# Patient Record
Sex: Female | Born: 1950 | Race: Black or African American | Hispanic: No | Smoking: Current every day smoker
Health system: Southern US, Community
[De-identification: ages and names within clinical notes are randomized; demographics above are authoritative.]

## PROBLEM LIST (undated history)

## (undated) DIAGNOSIS — IMO0001 Reserved for inherently not codable concepts without codable children: Secondary | ICD-10-CM

## (undated) DIAGNOSIS — I10 Essential (primary) hypertension: Secondary | ICD-10-CM

## (undated) DIAGNOSIS — Z720 Tobacco use: Secondary | ICD-10-CM

## (undated) DIAGNOSIS — K2970 Gastritis, unspecified, without bleeding: Secondary | ICD-10-CM

## (undated) DIAGNOSIS — K2289 Other specified disease of esophagus: Secondary | ICD-10-CM

## (undated) DIAGNOSIS — R208 Other disturbances of skin sensation: Secondary | ICD-10-CM

## (undated) DIAGNOSIS — I2510 Atherosclerotic heart disease of native coronary artery without angina pectoris: Secondary | ICD-10-CM

## (undated) DIAGNOSIS — M1990 Unspecified osteoarthritis, unspecified site: Secondary | ICD-10-CM

## (undated) DIAGNOSIS — F17200 Nicotine dependence, unspecified, uncomplicated: Secondary | ICD-10-CM

## (undated) DIAGNOSIS — R51 Headache: Secondary | ICD-10-CM

## (undated) DIAGNOSIS — K279 Peptic ulcer, site unspecified, unspecified as acute or chronic, without hemorrhage or perforation: Secondary | ICD-10-CM

## (undated) DIAGNOSIS — G43909 Migraine, unspecified, not intractable, without status migrainosus: Secondary | ICD-10-CM

## (undated) DIAGNOSIS — J45909 Unspecified asthma, uncomplicated: Secondary | ICD-10-CM

## (undated) DIAGNOSIS — K228 Other specified diseases of esophagus: Secondary | ICD-10-CM

## (undated) DIAGNOSIS — E785 Hyperlipidemia, unspecified: Secondary | ICD-10-CM

## (undated) DIAGNOSIS — I209 Angina pectoris, unspecified: Secondary | ICD-10-CM

## (undated) DIAGNOSIS — J40 Bronchitis, not specified as acute or chronic: Secondary | ICD-10-CM

## (undated) DIAGNOSIS — J189 Pneumonia, unspecified organism: Secondary | ICD-10-CM

## (undated) DIAGNOSIS — K219 Gastro-esophageal reflux disease without esophagitis: Secondary | ICD-10-CM

## (undated) HISTORY — PX: FRACTURE SURGERY: SHX138

## (undated) HISTORY — PX: BACK SURGERY: SHX140

## (undated) HISTORY — PX: COLONOSCOPY: SHX174

---

## 1999-11-12 ENCOUNTER — Encounter: Payer: Self-pay | Admitting: Gastroenterology

## 1999-11-12 ENCOUNTER — Ambulatory Visit (HOSPITAL_COMMUNITY): Admission: RE | Admit: 1999-11-12 | Discharge: 1999-11-12 | Payer: Self-pay | Admitting: Gastroenterology

## 2000-04-13 ENCOUNTER — Other Ambulatory Visit: Admission: RE | Admit: 2000-04-13 | Discharge: 2000-04-13 | Payer: Self-pay | Admitting: Obstetrics

## 2000-10-20 ENCOUNTER — Encounter: Payer: Self-pay | Admitting: Obstetrics

## 2000-10-20 ENCOUNTER — Encounter: Admission: RE | Admit: 2000-10-20 | Discharge: 2000-10-20 | Payer: Self-pay | Admitting: Obstetrics

## 2001-08-02 ENCOUNTER — Inpatient Hospital Stay (HOSPITAL_COMMUNITY): Admission: AD | Admit: 2001-08-02 | Discharge: 2001-08-02 | Payer: Self-pay | Admitting: Obstetrics

## 2001-08-02 ENCOUNTER — Encounter (INDEPENDENT_AMBULATORY_CARE_PROVIDER_SITE_OTHER): Payer: Self-pay

## 2001-10-23 ENCOUNTER — Encounter: Admission: RE | Admit: 2001-10-23 | Discharge: 2001-10-23 | Payer: Self-pay | Admitting: Obstetrics

## 2001-10-23 ENCOUNTER — Encounter: Payer: Self-pay | Admitting: Obstetrics

## 2002-02-19 ENCOUNTER — Ambulatory Visit (HOSPITAL_COMMUNITY): Admission: RE | Admit: 2002-02-19 | Discharge: 2002-02-19 | Payer: Self-pay | Admitting: Cardiology

## 2002-02-19 ENCOUNTER — Encounter: Payer: Self-pay | Admitting: Cardiology

## 2002-03-07 HISTORY — PX: CARDIAC CATHETERIZATION: SHX172

## 2002-03-29 ENCOUNTER — Ambulatory Visit (HOSPITAL_BASED_OUTPATIENT_CLINIC_OR_DEPARTMENT_OTHER): Admission: RE | Admit: 2002-03-29 | Discharge: 2002-03-29 | Payer: Self-pay | Admitting: Orthopedic Surgery

## 2002-03-29 ENCOUNTER — Encounter (INDEPENDENT_AMBULATORY_CARE_PROVIDER_SITE_OTHER): Payer: Self-pay | Admitting: *Deleted

## 2002-10-29 ENCOUNTER — Encounter: Payer: Self-pay | Admitting: Obstetrics

## 2002-10-29 ENCOUNTER — Encounter: Admission: RE | Admit: 2002-10-29 | Discharge: 2002-10-29 | Payer: Self-pay | Admitting: Obstetrics

## 2002-11-15 ENCOUNTER — Ambulatory Visit (HOSPITAL_BASED_OUTPATIENT_CLINIC_OR_DEPARTMENT_OTHER): Admission: RE | Admit: 2002-11-15 | Discharge: 2002-11-15 | Payer: Self-pay | Admitting: Orthopedic Surgery

## 2002-12-27 HISTORY — PX: CORONARY ANGIOPLASTY: SHX604

## 2003-09-13 ENCOUNTER — Observation Stay (HOSPITAL_COMMUNITY): Admission: EM | Admit: 2003-09-13 | Discharge: 2003-09-14 | Payer: Self-pay | Admitting: Emergency Medicine

## 2003-09-13 ENCOUNTER — Encounter: Payer: Self-pay | Admitting: Emergency Medicine

## 2003-09-13 HISTORY — PX: CARDIAC CATHETERIZATION: SHX172

## 2003-12-30 ENCOUNTER — Encounter: Admission: RE | Admit: 2003-12-30 | Discharge: 2003-12-30 | Payer: Self-pay | Admitting: Obstetrics

## 2004-12-27 HISTORY — PX: ORIF ELBOW FRACTURE: SUR928

## 2006-05-11 ENCOUNTER — Ambulatory Visit (HOSPITAL_COMMUNITY): Admission: RE | Admit: 2006-05-11 | Discharge: 2006-05-11 | Payer: Self-pay | Admitting: Obstetrics

## 2006-05-27 HISTORY — PX: OTHER SURGICAL HISTORY: SHX169

## 2006-06-29 ENCOUNTER — Inpatient Hospital Stay (HOSPITAL_COMMUNITY): Admission: EM | Admit: 2006-06-29 | Discharge: 2006-07-02 | Payer: Self-pay | Admitting: Emergency Medicine

## 2006-07-06 ENCOUNTER — Emergency Department (HOSPITAL_COMMUNITY): Admission: EM | Admit: 2006-07-06 | Discharge: 2006-07-07 | Payer: Self-pay | Admitting: Emergency Medicine

## 2007-01-27 ENCOUNTER — Encounter: Admission: RE | Admit: 2007-01-27 | Discharge: 2007-01-27 | Payer: Self-pay | Admitting: Orthopedic Surgery

## 2007-05-15 ENCOUNTER — Ambulatory Visit (HOSPITAL_COMMUNITY): Admission: RE | Admit: 2007-05-15 | Discharge: 2007-05-15 | Payer: Self-pay | Admitting: Obstetrics

## 2007-06-06 ENCOUNTER — Ambulatory Visit (HOSPITAL_COMMUNITY): Admission: RE | Admit: 2007-06-06 | Discharge: 2007-06-06 | Payer: Self-pay | Admitting: Obstetrics

## 2007-06-09 ENCOUNTER — Encounter (INDEPENDENT_AMBULATORY_CARE_PROVIDER_SITE_OTHER): Payer: Self-pay | Admitting: Gastroenterology

## 2007-06-09 ENCOUNTER — Ambulatory Visit (HOSPITAL_COMMUNITY): Admission: RE | Admit: 2007-06-09 | Discharge: 2007-06-09 | Payer: Self-pay | Admitting: Gastroenterology

## 2007-07-18 ENCOUNTER — Encounter: Admission: RE | Admit: 2007-07-18 | Discharge: 2007-07-18 | Payer: Self-pay | Admitting: Gastroenterology

## 2008-02-05 ENCOUNTER — Ambulatory Visit (HOSPITAL_COMMUNITY): Admission: RE | Admit: 2008-02-05 | Discharge: 2008-02-05 | Payer: Self-pay | Admitting: Gastroenterology

## 2008-02-06 ENCOUNTER — Observation Stay (HOSPITAL_COMMUNITY): Admission: EM | Admit: 2008-02-06 | Discharge: 2008-02-07 | Payer: Self-pay | Admitting: Emergency Medicine

## 2008-02-20 ENCOUNTER — Ambulatory Visit (HOSPITAL_COMMUNITY): Admission: RE | Admit: 2008-02-20 | Discharge: 2008-02-20 | Payer: Self-pay | Admitting: Gastroenterology

## 2008-07-27 HISTORY — PX: TRANSTHORACIC ECHOCARDIOGRAM: SHX275

## 2008-12-27 HISTORY — PX: CARDIOVASCULAR STRESS TEST: SHX262

## 2010-05-06 ENCOUNTER — Ambulatory Visit (HOSPITAL_COMMUNITY): Admission: RE | Admit: 2010-05-06 | Discharge: 2010-05-06 | Payer: Self-pay | Admitting: Obstetrics

## 2010-05-10 ENCOUNTER — Emergency Department (HOSPITAL_COMMUNITY): Admission: EM | Admit: 2010-05-10 | Discharge: 2010-05-10 | Payer: Self-pay | Admitting: Emergency Medicine

## 2011-05-11 NOTE — Op Note (Signed)
Meghan Lynn, Meghan Lynn               ACCOUNT NO.:  [id number]   MEDICAL RECORD NO.:  [id number]          PATIENT TYPE:  AMB   LOCATION:  ENDO                         FACILITY:  [facility]   PHYSICIAN:  John C. Madilyn Fireman, M.D.    DATE OF BIRTH:  1951-01-30   DATE OF PROCEDURE:  02/05/2008  DATE OF DISCHARGE:                               OPERATIVE REPORT   PROCEDURE:  Esophagogastroduodenoscopy with pyloric balloon dilatation.   INDICATIONS FOR PROCEDURE:  Pyloric outlet obstruction due to excessive  use of nonsteroidal anti-inflammatory drugs.   PROCEDURE:  The patient was placed in the left lateral decubitus  position and placed on pulse monitor with continuous low-flow oxygen  delivered by nasal cannula.  She was sedated with 75 mcg IV fentanyl and  7 mg IV Versed.  The Olympus video endoscope was advanced under direct  vision into the oropharynx and esophagus.  The esophagus was straight  and of normal caliber with squamocolumnar line at 38 cm. Stomach was  entered and there was a moderate-to-large amount of old partially  digested food in the dependent portion of the fundus.  A complete  inspection of the stomach was therefore not undertaken.  The scope was  advanced to the antrum which was well seen and there was a small  prepyloric shallow ulcer about 2 cm away from the pylorus.  The pylorus  itself appeared round, fixed and stenotic with exudate within the  pyloric channel as before.  That opening was fairly small estimated to  be about 6 mm in diameter and would not admit the tip of the scope as  before.  The pyloric balloon dilator size 6, 7, and 8 was advanced under  fluoro with Renografin in the balloon and with a guidewire advancing  ahead of the balloon. It was positioned then inflated easily all the way  to 8 mm with little resistance and the balloon pulled back through into  the stomach very easily even when fully inflated.  There was no blood  seen from this dilatation.  This  dilator was thus withdrawn and then 8-9-  10 mm balloon was placed and similarly inflated this time with some  resistance, small amount of bleeding.  When fully inflated the balloon  still would shift fairly easily within up and down through the pylorus.  It was then deflated and a third balloon dilator diameters 10-07-11 was  then inflated first to 10 then to 11 and then to 12 mm and held for 15-  second intervals each.  It was then deflated and the scope withdrawn and  there was a significant amount of blood around the pyloric opening.  I  did not attempt pass the scope through it.  Scope was then withdrawn and  the patient returned to the recovery room in stable condition.  She  tolerated the procedure well.  There were no immediate complications.   IMPRESSION:  1. Pyloric stenosis with partial outlet obstruction, status post      dilatation to 12 mm.   PLAN:  Will allow a 2-week interval for healing and assessment of  clearance on a gradually advance diet.  Will plan on tentatively  repeating the procedure in 2 weeks with probable further dilatation up  to 14 or 16 mm.  Will need to stop her aspirin and Plavix 5 days before.           ______________________________  Everardo All. Madilyn Fireman, M.D.     JCH/MEDQ  D:  02/05/2008  T:  02/06/2008  Job:  [id number]   cc:   Madaline Savage, M.D.  [facility]

## 2011-05-11 NOTE — Op Note (Signed)
NAMESONNA, LIPSKY               ACCOUNT NO.:  [id number]   MEDICAL RECORD NO.:  [id number]          PATIENT TYPE:  AMB   LOCATION:  ENDO                         FACILITY:  [facility]   PHYSICIAN:  Anselmo Rod, M.D.  DATE OF BIRTH:  11-May-1951   DATE OF PROCEDURE:  06/09/2007  DATE OF DISCHARGE:                               OPERATIVE REPORT   PROCEDURE PERFORMED:  Esophagogastroduodenoscopy with multiple biopsies.   ENDOSCOPIST:  Anselmo Rod, M.D.   INSTRUMENT USED:  Pentax video panendoscope.   INDICATIONS FOR PROCEDURE:  60 year old African American female  undergoing EGD for dysphagia and gastric pain, rule out peptic ulcer  disease, esophagitis, gastritis, etc.  A dilatation is planned if  needed.   PREPROCEDURE PREPARATION:  Informed consent was procured from the  patient.  The patient fasted for 8 hours prior to procedure.  The  risks  and benefits of the procedure were discussed with the patient in great  detail.   PREPROCEDURE PHYSICAL:  The patient had stable vital signs.  NECK:  Supple.  The CHEST:  Clear to auscultation.  S2 regular.  Abdomen soft  with normal bowel sounds.   DESCRIPTION OF PROCEDURE:  The patient was placed in the left lateral  decubitus position and sedated with 85 mcg of Fentanyl and 8.5 mg of  Versed given intravenously in slow incremental doses.  Once the patient  was adequately sedate and maintained on low-flow oxygen and continuous  cardiac monitoring, the Pentax video panendoscope was advanced through  the mouthpiece over the tongue into esophagus under direct vision.  The  entire esophagus was widely patent with no evidence of ring, stricture,  mass, esophagitis or Barrett's mucosa.  The scope was then advanced into  the stomach.  Moderate diffuse gastritis was noted.  Multiple antral  erosions and a small superficial ulcer, no visible vessel was noted.  Retroflexion in the high cardia revealed no evidence of a hiatal hernia.  Antral biopsies done to rule out presence of H pylori by pathology.  There seemed to be some degree of pyloric stenosis as I could not  intubate the small bowel in spite of multiple efforts to do so there was  friability with easy bleeding in the antrum.  The proximal small bowel  was not visualized.  The patient tolerated the procedure well without  immediate complications.   IMPRESSION:  1. Normal-appearing appearing esophagus, widely patent with a healthy      Z-line. No Barrett's mucosa, stricture, esophagitis or masses seen.  2. Moderate diffuse gastritis with antral erosions and superficial      antral ulcer.  Biopsies done to rule out H pylori.  3. Pyloric stenosis, proximal small bowel not visualized.   RECOMMENDATIONS:  1. Continue PPI's.  2. Await pathology results.  3. Treat with antibiotics if H pylori present on biopsies.  4. Avoid all nonsteroidals for now.  5. Outpatient follow-up in the next 2 weeks for further      recommendations.      Anselmo Rod, M.D.  Electronically Signed     JNM/MEDQ  D:  06/09/2007  T:  06/09/2007  Job:  [id number]   cc:   Kathreen Cosier, M.D.  Fax: [phone]   J. Kristen Cardinal, D.D.S.  Fax: [phone]

## 2011-05-11 NOTE — Op Note (Signed)
NAMECHRISTOL, Meghan Lynn               ACCOUNT NO.:  [id number]   MEDICAL RECORD NO.:  [id number]          PATIENT TYPE:  AMB   LOCATION:  ENDO                         [facility]ACILITY:  [facility]   PHYSICIAN:  John C. Madilyn Fireman, M.D.    DATE OF BIRTH:  08/24/1951   DATE OF PROCEDURE:  02/20/2008  DATE OF DISCHARGE:                               OPERATIVE REPORT   PROCEDURE PERFORMED:  Esophagogastroduodenoscopy.   INDICATIONS FOR PROCEDURE:  Pyloric stenosis with gastric outlet  obstruction and successful dilatation to 12 mm in the past. She has had  some improvement and some weight gain since then.   PROCEDURE:  The patient was placed in the left lateral decubitus  position and placed on the pulse monitor with continuous low flow oxygen  delivered by nasal cannula.  She was sedated with 75 mcg IV fentanyl and  7.5 mg IV Versed.  The Olympus video endoscope was advanced under direct  vision into the oropharynx and esophagus.  The esophagus was straight  and of normal caliber with the squamocolumnar line at 38 cm. There was  no hiatal hernia, ring, stricture, or other abnormality of the GE  junction.  The stomach was entered and a small amount of liquid  secretions were suctioned from the fundus.  Retroflexed view of the  cardia was unremarkable.  The fundus and body appeared normal.  The  antrum did show some mild erythema and granularity consistent with some  small erosions. The pylorus, itself, appeared stenotic with a small rim  of inflammatory exudate and would not admit the tip of the scope. The  pyloric balloon dilator of adjustable diameter of 10, 11, and 12 mm was  inflated in increments for 15 seconds each up to 12 mm and then deflated  leaving a moderate amount of blood.  This was removed and then the 12,  13.5, and 15 mm balloon was deployed in the same way.  The scope was  then withdrawn and the patient returned to the recovery room in stable  condition.  He tolerated the procedure well  and there were no immediate  complications.   IMPRESSION:  Pyloric stenosis status post dilatation to 15 mm.   PLAN:  Will follow up in the office and assess whether or not she needs  further dilatation.           ______________________________  Everardo All Madilyn Fireman, M.D.     JCH/MEDQ  D:  02/20/2008  T:  02/20/2008  Job:  [id number]   cc:   Madaline Savage, M.D.  Fax: [phone]

## 2011-05-11 NOTE — H&P (Signed)
NAMEMICHIE, MOLNAR               ACCOUNT NO.:  [id number]   MEDICAL RECORD NO.:  [id number]          PATIENT TYPE:  INP   LOCATION:  5127                         FACILITY:  [facility]   PHYSICIAN:  John C. Madilyn Fireman, M.D.    DATE OF BIRTH:  [birth year]   DATE OF ADMISSION:  02/06/2008  DATE OF DISCHARGE:                              HISTORY & PHYSICAL   CHIEF COMPLAINT:  Vomiting blood.   HISTORY OF PRESENT ILLNESS:  The patient is a 60 year old black female  who underwent a dilatation of pyloric channel stenosis related to NSAID  use yesterday.  Yesterday afternoon she began having about nausea and  vomiting of a little bit of blood.  Later on early this morning she  vomited up more blood and came to the emergency room.  She was found to  have normal hemoglobin and acute abdominal series revealed no free air.  She is not having significant chest or abdominal pain but is admitted  for observation and consideration of EGD if GI blood loss continues.   PAST MEDICAL HISTORY:  1. Hypertension.  2. Migraine headaches.  3. History of abnormal heart rhythm.  4. Coronary artery disease.  5. History of depression.   SURGERIES:  1. Cardiac stent placement.  2. Tubal ligation.  3. Elbow surgery.   FAMILY HISTORY:  Mother had colon polyps.   SOCIAL HISTORY:  The patient is retired.  She smokes half a pack of  cigarettes a day.  She denies alcohol use.   ALLERGIES:  DARVON.   MEDICATIONS:  Aspirin 81 mg, currently on hold, Diovan. Niaspan,  AcipHex, Sular, Lipitor, Plavix currently on hold, Fioricet for  headaches.   PHYSICAL EXAM:  Thin black female in no acute distress.  HEART:  Regular rate and rhythm without murmur.  LUNGS:  Clear.  ABDOMEN:  Soft, nondistended, with normoactive bowel sounds.  No  hepatosplenomegaly, mass or guarding.   IMPRESSION:  Gastrointestinal bleeding after duodenal dilatation  yesterday.   PLAN:  Admit for IV fluid support, nausea control and observation  for  ongoing GI bleeding.  May or may not need repeat EGD.           ______________________________  Everardo All. Madilyn Fireman, M.D.     JCH/MEDQ  D:  02/06/2008  T:  02/07/2008  Job:  [id number]

## 2011-05-11 NOTE — Op Note (Signed)
[name], Meghan Lynn               ACCOUNT NO.:  [id number]   MEDICAL RECORD NO.:  [id number]          PATIENT TYPE:  AMB   LOCATION:  ENDO                         FACILITY:  [facility]   PHYSICIAN:  Anselmo Rod, M.D.  DATE OF BIRTH:  August 29, 1951   DATE OF PROCEDURE:  06/09/2007  DATE OF DISCHARGE:                               OPERATIVE REPORT   PROCEDURE PERFORMED:  Colonoscopy with cold biopsies x3.   ENDOSCOPIST:  Anselmo Rod, M.D.   INSTRUMENT USED:  Pentax video colonoscope.   INDICATIONS FOR PROCEDURE:  A 60 year old African American female  undergoing a screening colonoscopy to rule out colonic polyps, masses,  etc.   PREPROCEDURE PREPARATION:  Informed consent was procured from the  patient.  The patient fasted for 8 hours prior to the procedure and  prepped with a bottle of magnesium citrate and a gallon of NuLYTELY the  night prior to the procedure.  The risks and benefits of the procedure  including a 10% miss rate of cancer and polyps were discussed with the  patient as well.   PREPROCEDURE PHYSICAL:  VITAL SIGNS:  The patient had stable vital  signs.  NECK:  Supple.  CHEST:  Clear to auscultation.  S1, S2 regular.  ABDOMEN:  Soft with normal bowel sounds.   DESCRIPTION OF PROCEDURE:  The patient was placed in the left lateral  decubitus position and sedated with an additional 15 mcg of Fentanyl and  1.5 mg of Versed given intravenously in slow incremental doses.  Once  the patient was adequately sedate and maintained on low-flow oxygen and  continuous cardiac monitoring, the Pentax video colonoscope was advanced  from the rectum to the cecum.  The appendiceal orifice and ileocecal  valve were visualized and photographed.  No masses, polyps, erosions,  etc., was seen.  There was no evidence of diverticulosis.  There was a  large amount of residual stool in the colon.  Multiple washes were done.  The terminal ileum appeared healthy and without lesions.  Small  lesions  could be missed because of a poor prep.  A small erosion was biopsied at  15 cm (cold biopsies x3).  Retroflexion in the rectum revealed no  abnormalities.  The patient tolerated the procedure well without  immediate complications.   IMPRESSION:  1. Essentially normal colonoscopy up to the terminal ileum except for      a small erosions biopsied from 1-5 cm.  2. No masses, polyps or diverticula seen.  3. Large amount of residual stool in the colon.  Multiple washes done.      Small lesions could be missed.   RECOMMENDATIONS:  1. Await pathology results.  2. Avoid all nonsteroidals including aspirin for the next 8 weeks.  3. Outpatient follow-up in the next 2 weeks for further      recommendations.      Anselmo Rod, M.D.  Electronically Signed     JNM/MEDQ  D:  06/09/2007  T:  06/09/2007  Job:  [id number]   cc:   Kathreen Cosier, M.D.  Fax: [phone]   Deanna Artis.  Elsie Lincoln, M.D.  Fax: [phone]

## 2011-05-11 NOTE — Op Note (Signed)
Meghan Lynn, Meghan Lynn               ACCOUNT NO.:  [id number]   MEDICAL RECORD NO.:  [id number]          PATIENT TYPE:  AMB   LOCATION:  ENDO                         FACILITY:  [facility]   PHYSICIAN:  John C. Madilyn Fireman, M.D.    DATE OF BIRTH:  Dec 01, 1951   DATE OF PROCEDURE:  02/20/2008  DATE OF DISCHARGE:                               OPERATIVE REPORT   PROCEDURE PERFORMED:  Esophagogastroduodenoscopy.   INDICATIONS FOR PROCEDURE:  Known pyloric stenosis with gastric outlet  obstruction with previous balloon dilatation at 12 mm approximately  three weeks ago with some improvement since, still somewhat limited to  full liquid or soft diet.  This procedure is to assess for the possible  need for further dilatation.   PROCEDURE:  The patient was placed in the left lateral decubitus  position and placed on the pulse monitor with continuous low flow oxygen  delivered by nasal cannula she was sedated   Dictation ends at this point.           ______________________________  Everardo All. Madilyn Fireman, M.D.     JCH/MEDQ  D:  02/20/2008  T:  02/20/2008  Job:  [id number]   cc:   Madaline Savage, M.D.  Fax: [phone]

## 2011-05-14 NOTE — Op Note (Signed)
   NAME:  Meghan Lynn, Meghan Lynn                         ACCOUNT NO.:  [id number]   MEDICAL RECORD NO.:  [id number]                   PATIENT TYPE:  AMB   LOCATION:  DSC                                  FACILITY:  [facility]   PHYSICIAN:  Cindee Salt, M.D.                    DATE OF BIRTH:  1951/04/26   DATE OF PROCEDURE:  11/15/2002  DATE OF DISCHARGE:                                 OPERATIVE REPORT   PREOPERATIVE DIAGNOSIS:  Carpal tunnel syndrome, left hand.   POSTOPERATIVE DIAGNOSIS:  Carpal tunnel syndrome, left hand.   OPERATION:  Decompression, left median nerve.   SURGEON:  Cindee Salt, M.D.   ASSISTANT:  R.N.   ANESTHESIA:  Forearm based IV regional.   INDICATIONS:  The patient is a 60 year old female with a history of carpal  tunnel syndrome, electromyogram nerve conduction positive, which has not  responded to conservative treatment.   DESCRIPTION OF PROCEDURE:  The patient was brought  to the operating room  where a forearm based IV region anesthetic was carried without difficulty.  She was prepped and draped using Betadine scrub and solution. The left arm  was free and she was in the supine position.   A longitudinal incision was made in the palm and carried down through the  subcutaneous tissue. Bleeders were electrocauterized. The palmar fascia was  split. The superficial palmar artery was identified. The flexor tendon to  the ring and little finger were identified. To the ulnar side of the median  nerve the carpal retinaculum was incised with sharp dissection.   A right-angle and Sewell retractor were placed between skin and forearm  fascia. The fascia was released for approximately 3 cm proximal to the wrist  crease under direct vision. The canal was explored and was found to be tight  on release, but no further lesions were identified. A persistent median  artery was present; this had not thrombosed.   The wound was irrigated. The skin was closed with interrupted 5-0  nylon  sutures. A sterile  compressive dressing  and splint were applied.   The patient tolerated the procedure well and was taken  to the recovery room  for observation in satisfactory condition. She is discharged to home to  return to the [facility] [place] in one week on Vicodin and Keflex.                                               Cindee Salt, M.D.   GK/MEDQ  D:  11/15/2002  T:  11/15/2002  Job:  [id number]

## 2011-05-14 NOTE — Op Note (Signed)
Meghan Lynn, Meghan Lynn               ACCOUNT NO.:  [id number]   MEDICAL RECORD NO.:  [id number]          PATIENT TYPE:  INP   LOCATION:  2550                         FACILITY:  [facility]   PHYSICIAN:  Dionne Ano. Gramig III, M.D.DATE OF BIRTH:  1951-01-22   DATE OF PROCEDURE:  06/29/2006  DATE OF DISCHARGE:                                 OPERATIVE REPORT   PREOPERATIVE DIAGNOSIS:  1.  Closed fracture dislocation right elbow with comminuted radial head and      olecranon including coronoid fragments.  2.  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   POSTOPERATIVE DIAGNOSIS:  1.  Closed fracture dislocation right elbow with comminuted radial head and      olecranon including coronoid fragments.  2.  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   PROCEDURE:  1.  Fasciotomy right forearm.  2.  Radial head resection and arthroplasty utilizing a size 22 Accumed      prosthesis.  3.  Open reduction and internal fixation ulna and coronoid fracture      secondary to fracture dislocation of the ulnar humeral joint.  4.  Ulnar release in situ about the cubital tunnel, this was a distinct and      separate portion of the procedure.  5.  Collateral ligament repair, right elbow, lateral aspect.  6.  Stress radiography right upper extremity.   SURGEON:  Dionne Ano. Amanda Pea, M.D.   ASSISTANT:  Karie Chimera, P.A.-C.   ANESTHESIA:  General .   DRAINS:  One.   COMPLICATIONS:  None.   ESTIMATED BLOOD LOSS:  Less than 100 mL.   INDICATIONS FOR PROCEDURE:  This patient is a female in her mid to late 34s  who fell on a slippery carport today sustaining a significant fracture  dislocation to her elbow.  She presented with severe pain, tight  compartments with an evolving compartment syndrome in my opinion given the  tightness.  She did have a radial pulse and has since been having some  tingling in the thumb.  She had x-rays which revealed a very comminuted  fracture  dislocation of the elbow.  She has a history of heart disease and  is on Plavix and aspirin.  Given the compartment syndrome in evolution and  major fracture dislocation, I prepped her for operative intervention with  the patient understanding the risks and benefits of infection, bleeding,  anesthesia, damage to normal structures, and failure of the surgery to  accomplish its intended goals.  With this in mind, she desires to proceed,  understanding that dystrophy, contracture, instability, malunion, nonunion,  etc., are possible complications.  With all issues in mind, she desires to  proceed.  All questions have been encouraged and answered.   PROCEDURE IN DETAIL:  The patient was taken to the operative suite and had  prominent side extremity marked and consultation with anesthesia including  review of her labs, cardiac, and chest x-ray.  Once in the operative suit,  the patient underwent general anesthesia and she was gently turned into a  modified lateral position.  A bean bag was inflated.  The body parts were  well padded.  The patient had the right upper extremity prepped and draped  in the usual sterile fashion.   The shoulder about the right shoulder girdle was stable to ligamentous  examination.  She did describe some subluxation type features during the  course of her injury, however, x-rays were negative and she was stable on  exam interoperatively.  Once prepped and draped with Betadine scrub and  paint followed by sterile field application, I performed posterolateral  incision to the elbow.  This was done sharply with the knife blade under 250  mmHg of sterile tourniquet control.  The skin flaps were elevated and once  this was done, I then identified the FCU, FCR, and the extensor fascia.  These fascial compartments were released and blunt tip scissors were used to  slide and release the compartments.  The patient tolerated this well and the  fascia was nicely decompressed.   Once the fasciotomy was complete, I then  turned attention towards the ulna and radius.  The patient had a very  comminuted radial head fracture and ulna fracture.  Through a combination of  the posterior wound and a lateral incision between the ECU and anconeus, I  then copiously irrigated the area.  It was quite clear but given the  impressive swelling, she would require an ulnar nerve release.   Once this was noted, I then turned attention posteromedially and performed  an ulnar nerve release releasing the arcade of Struthers, medial  intermuscular septum, cubital tunnel, Osborne's ligament, and the two heads  of the FCU, both superficial and deep.  The nerve was stable.  Bipolar  electrocautery was used for hemostasis.  Once this was done, I turned  attention towards the lateral aspect where the patient had the remnants of  the radial head retrieved.  The patient had a very comminuted radial head  fracture and this was removed.  The joint was then irrigated and the  coronoid fracture, which was very small approximately 1 cm in length by 3 mm  thick, was retrieved.  It was attached to the capsule and I placed a  FiberWire stitch through the coronoid fragment with 2-0 FiberWire.  This was  done with a drill bit and I engaged portions of the capsule while doing so  as to incorporate this into the repair and provide extra stability.  Once  this was complete, I then delivered the radial head through the posterior  wound and with a saw resected this at the level of the neck, taking care to  preserve the annular ligament.  Following this, I then very carefully  trialed a size 22 number 6 stem.  This fit nicely.  It reduced well and,  following this, I copiously irrigated and made plans to place the final  implant which was done without difficulty.  The implant was placed.  The  joint was then reduced.  Once this was done, I then performed open reduction and internal fixation of the  olecranon.  This was done with two 0.062 K-  wires and tension band wire technique as per AO technique.  The fracture  site was reduced anatomically and the tension band, which was 18 gauge, was  then placed in figure-of-eight fashion around the wires.  The wires were  then bent, cut, and delivered.  I did allow the wires to be bicortical so as  to have the best fixation.  These were not intermedullary wires.  The  tension band was tightened to my satisfaction.  This allowed for anatomic  reduction of the ulna and I was quite pleased with this as viewed under  stress radiography.  Once this was done, I then copiously irrigated the  region once again.   All looked well and the patient was taken through live fluoroscopy.  Prior  to taking the patient through live fluoroscopy, I had the two FiberWire  sutures placed from anterior to posterior and were tied against a cortical  bridge.  This was done by pre-placing two drill holes where the fracture had  a bolus from on the coronoid prior to reducing the radial head on the  construct.  This was carefully thawed out and allowed for reduction of the  coronoid and capsule against our anatomic landmarks.  I was quite pleased  with this and this sat very nicely.  Once this was done, we copiously  irrigated and closed the fascia and deflated the tourniquet demonstrating  soft compartments.  The patient had the intervals closed where we performed  the initial approach and the fascia overlying the fractures was closed as  was the fascia overlying the K-wire and tension band apparatus.  Stress  radiography was performed revealing excellent reduction and stability.  I  was pleased with the findings.  I checked the compartments multiple times.  She had an excellent radial pulse, soft compartments, although some residual  swelling in general was noted due to the trauma, itself.  Her shoulder was  stable and the wound was then closed over a medium Hemovac  drain with 3-0  Vicryl followed by staple clips.  The patient tolerated this well.  She had  her compartments checked once again followed by placement of Xeroform gauze  and a posterior plaster splint with derotation slide.  She tolerated this  well.  A Leavy Cella bridge was placed about her casting apparatus to prevent  extension.  Once this was done, she was awakened from anesthesia and taken  to the recovery room.  We will begin antibiotics immediately in the recovery  room, monitor her condition closely.  She understands this and all questions  have been encouraged and answered.  I am going to ask that she be monitored  carefully during the postoperative period and we will consult her medical  physicians in regards to her inpatient status.           ______________________________  Dionne Ano. Everlene Other, M.D.     [name]  D:  06/29/2006  T:  06/29/2006  Job:  [id number]   cc:   Madaline Savage, M.D.  Fax: [phone]

## 2011-05-14 NOTE — Op Note (Signed)
[place]  Patient:    Meghan Lynn, Meghan Lynn Visit Number: [id number] MRN: [id number]          Service Type: DSU Location: [facility] Attending Physician:  Ronne Binning Dictated by:   Nicki Reaper, M.D. Proc. Date: 03/29/02 Admit Date:  03/29/2002 Discharge Date: 03/29/2002   CC:         (2)   Operative Report  PREOPERATIVE DIAGNOSIS: Mass, right middle finger.  POSTOPERATIVE DIAGNOSIS: Mass, right middle finger.  OPERATION/PROCEDURE: Excision of mass, right middle finger.  SURGEON: Nicki Reaper, M.D.  ASSISTANT: Joaquin Courts, R.N.  ANESTHESIA: Forearm-based IV regional.  ANESTHESIOLOGIST: Burna Forts, M.D.  HISTORY: The patient is a 60 year old female, with a history of an old laceration over the palmar aspect of the right middle finger, distal interphalangeal joint crease, which is painful for her.  PROCEDURE: The patient was brought to the operating room, where a forearm-based IV regional anesthetic was carried out without difficulty.  SHe was prepped and draped using Betadine scrubbing solution with the right arm free.  An elliptical incision was made in the crease, carried down through subcutaneous tissue.  This was brought around either side of the mass, which was extremely hard.  This was excised in toto and sent to pathology, entered into the subcutaneous tissue only.  No further lesions were identified.  The wound was irrigated.  The skin was closed with interrupted 5-0 nylon suture. sterile compressive dressing and splint were applied to the finger.  The patient tolerated the procedure well and was taken to the recovery room for observation in satisfactory condition.  She is discharged home to return to the [facility] in one week, on Tylenol #3 and Keflex. Dictated by:   Nicki Reaper, M.D. Attending Physician:  Ronne Binning DD:  03/29/02 TD:  03/29/02 Job: [id number]

## 2011-05-14 NOTE — Cardiovascular Report (Signed)
NAME:  Meghan Lynn, Meghan Lynn                         ACCOUNT NO.:  [id number]   MEDICAL RECORD NO.:  [id number]                   PATIENT TYPE:  INP   LOCATION:  6599                                 FACILITY:  [facility]   PHYSICIAN:  Darlin Priestly, M.D.             DATE OF BIRTH:  1951/02/23   DATE OF PROCEDURE:  09/13/2003  DATE OF DISCHARGE:                              CARDIAC CATHETERIZATION   PROCEDURES PERFORMED:  1. Left heart catheterization.  2. Coronary angiography.  3. Left ventriculogram.  4. Right coronary artery-stent.   CARDIOLOGIST:  Darlin Priestly, M.D.   COMPLICATIONS:  None.   INDICATIONS:  Ms. Ayuso is a 60 year old female, patient of Dr. Lavonne Chick and Dr. Gaynell Face with a history of hypertension, hyperlipidemia and  history of CAD status post cardiac catheterization by Dr. Elsie Lincoln  approximately one year ago with a 70% mid RCA lesion.  The patient had a  follow up Cardiolite scan with no evidence of ischemia.  She was noted to  have a normal EF.  She does continue smoke.  She presented to the ER on  September 13, 2003 with midepigastric chest pain associated with shortness  of breath, nausea and diaphoresis.  She is now brought for repeat  catheterization to reassess her RCA.   DESCRIPTION OF PROCEDURE:  After obtaining written informed consent the  patient was cardiac cath lab the right and left groins were shaved, prepped  and draped in the usual sterile fashion.  ECG monitoring was established.  Using the modified Seldinger technique #6 French arterial sheath was  inserted into the right femoral artery.  Six French diagnostic catheters  were used to perform diagnostic angiography.   This revealed a large left main with no significant disease.   The LAD is a large vessel that courses the apex and goes to three diagonal  branches.  The LAD is noted to have a 60% midvessel stenotic lesion after  the takeoff of the second diagonal.  The remainder of  the LAD has no  significant disease.  The first diagonal is a large vessel with no  significant disease.  The second and third diagonals are small vessels with  no significant disease.   The left circumflex is a large coursing the AV groove and goes to three  obtuse marginal branches.  The AV groove circumflex has no significant  disease.  The first OM is a large vessel, which bifurcates distally and has  no significant disease.  The second OM is a small vessel wit no significant  disease.  The third OM is a medium-sized vessel, which bifurcates distally  and has no significant disease.   The right coronary artery is a medium-sized vessel, which has diffuse  disease in its mid and distal segments with 70% long mid, 95% mid and 80%  distal stenotic lesions.  The PDA and posterolateral branch have no  significant disease.  Left ventriculogram reveals a preserved EF of 60%.   HEMODYNAMIC DATA:  Systemic arterial pressure 164/96.  LV systemic pressure  164/7.  LVEDP of 20.   DESCRIPTION OF INTERVENTIONAL PROCEDURE:  RCA-Proximal/Mid/Distal:  Following diagnostic angiography a #6 [place] JR-4 guiding catheter with side  holes was then engaged in the right coronary ostium.  Next, a 0.014 short  Forte guide wire was advanced down the guiding catheter into the mid RCA and  measurements were obtained.  The guide wire was then positioned in the PLA  without difficulty.  Next, a CYPHER 2.5 x 18 mm stent was then tracked into  the mid RCA stenotic lesion.  This stent was then deployed to a maximum of  16 atmospheres for one minute.  Follow up angiogram revealed no evidence of  dissection or thrombus; however, there was diffuse disease to the stent with  a stepdown in the distal margin.  Next, a second CYPHER 2.5 x 23 mm stent  was then positioned in the distal RCA with careful attention to overlap the  distal segments.  This stent was then deployed to maximum of 14 atmospheres  for a total of  one minute.  This balloon was then pulled back over the  overlapping segment and one inflation to 16 atmospheres was performed for a  total 31 seconds.  Follow up angiogram revealed no evidence of dissection or  thrombus; however, there now appeared to be a stepdown at the proximal edge  of the stent with diffuse disease.  This stent and balloon were then removed  and a TAXUS Express-2 2.75 x 12 mm stent was then positioned in the proximal  RCA with careful attention to overlap the distal segment.  This stent was  then deployed to a maximum of 14 atmospheres for a total of 63 seconds.  The  balloon was then advanced over the overlapping segment and one additional  inflation to 14 atmospheres was performed for 24 seconds.  Follow up  angiogram revealed no evidence of dissection or thrombus with TIMI III flow  to the distal vessel.   At this point I decided to conclude the procedure.   IV Angiomax was used throughout the case.  All balloons, wires and catheters  were removed.  Hemostatic sheaths were sewn into place and the patient went  back to the ward in stable condition.   CONCLUSIONS:  1. Successful placement of a CYPHER 2.5 x 23, CYPHER 2.5 x 18 and TAXUS 2.75     x 12 in the distal, mid and proximal right coronary artery stenotic     lesions respectively.  2. Normal left ventricular systolic function.  3. Systemic hypertension.  4. Adjunct use of Angiomax infusion.                                                 Darlin Priestly, M.D.    RHM/MEDQ  D:  09/13/2003  T:  09/14/2003  Job:  [id number]   cc:   Madaline Savage, M.D.  [address]  [place]  [place] [address]  Fax: [phone]   Dr. Gaynell Face

## 2011-05-14 NOTE — Op Note (Signed)
Meghan Lynn, Meghan Lynn               ACCOUNT NO.:  [id number]   MEDICAL RECORD NO.:  [id number]          PATIENT TYPE:  INP   LOCATION:  2550                         FACILITY:  [facility]   PHYSICIAN:  Dionne Ano. Gramig III, M.D.DATE OF BIRTH:  [birth year]   DATE OF PROCEDURE:  06/29/2006  DATE OF DISCHARGE:                                 OPERATIVE REPORT   ADDENDUM:  I should note that Ms. Sunday Corn underwent lateral and a collateral  ligament repair with #2 Fiber Wire during the course of her procedure.  This  was done by reattaching the ulnohumeral area, which was avulsed off the  lateral epicondylar region.  This was imbricated and tightened nicely with  her closure and there were no complications with this.  Her live fluoro exam  with all ORIF and radial head arthroplasty completed demonstrated excellent  stability and I was quite pleased with this.           ______________________________  Dionne Ano. Everlene Other, M.D.     [name]  D:  06/29/2006  T:  06/29/2006  Job:  [id number]

## 2011-05-14 NOTE — Discharge Summary (Signed)
NAMESURIYA, KOVARIK               ACCOUNT NO.:  [id number]   MEDICAL RECORD NO.:  [id number]          PATIENT TYPE:  INP   LOCATION:  5019                         FACILITY:  [facility]   PHYSICIAN:  Dionne Ano. Gramig, M.D.DATE OF BIRTH:  [birth year]   DATE OF ADMISSION:  06/29/2006  DATE OF DISCHARGE:  07/02/2006                                 DISCHARGE SUMMARY   ADMISSION DIAGNOSES:  1. Terrible-triad fracture (comminuted radial head and olecranon fracture      including the coronoid to the right elbow).  2. Compartment syndrome in evolution.  3. History of coronary artery disease with stent placement x3.  4. History of hypertension.  5. History of hypercholesterolemia.  6. History of tobacco abuse.  7. History of gastroesophageal reflux disease.   DISCHARGE DIAGNOSES:  1. Terrible-triad fracture (comminuted radial head and olecranon fracture      including the coronoid to the right elbow), improved.  2. Compartment syndrome in evolution.  3. History of coronary artery disease with stent placement x3.  4. History of hypertension.  5. History of hypercholesterolemia.  6. History of tobacco abuse.  7. History of gastroesophageal reflux disease.   SURGEON:  Dionne Ano. Amanda Pea, M.D.   CONSULTS:  Dr. Sharon Seller.   PROCEDURE:  Open reduction internal fixation terrible-triad fracture,  dislocation of the right elbow including fasciotomies to the right forearm.  Radial head resection arthroplasty.  Ulnar nerve release in situ  collaterally and repaired about the elbow.   BRIEF HISTORY OF THE PRESENT ILLNESS:  Ms. Dombrosky was a very pleasant 60-  year-old female, who unfortunately fell on June 29, 2006.  She slipped,  falling onto concrete.  She landed directly onto the right upper extremity,  sustaining a blow to the elbow.  She had immediate pain, swelling and  dysmotility and was seen and evaluated at [facility] Room, where  she was found to have an olecranon fracture with a  comminuted radial head  fracture and coronoid fracture, i.e. a terrible-triad fracture.  Hand upper  extremity surgeon, the on-call physician, was contacted for further  management and fixation.  Given the patient's multiple medical problems,  hospitalist on-call Dr. Sharon Seller, was contacted to follow her through and to  monitor for any medical issues that may arise during her inpatient stay.  Appropriate laboratory data was obtained preoperatively, including chest x-  ray, EKG, CBC with diff, C-MET, PT, PTT, INR and was noted to be stable for  surgery.  She did have a slightly elevated CBG at 122.  The patient was  admitted for operative intervention.   HOSPITAL COURSE:  On June 29, 2006, Ms. Buckel was admitted and underwent  the above procedure without difficulty.  There was no intraoperative  complication.  She tolerated this very well and was transferred back here in  stable condition.  Medicine per Dr. Sharon Seller, hospitalist did see and  evaluate the patient in the recovery unit and followed her closely through  during her postoperative stay, please see his consultation note for full  details.   Postoperative day number 1, the patient was doing  very well given the  significant injury she sustained.  Her vital signs were stable.  She was  afebrile.  She was tolerating p.o.  She was tender as expected.  Her drain  was pulled without difficulty upper extremity.  Radial, ulnar and median  nerves were intact.  No signs of compartment syndrome were noted.  OT and PT  were consulted for range of motion, massage, edema control and ambulation.  The patient's condition continued to improve.  She remained both  orthopedically and medically stable throughout her stay.   On postoperative day number 2, her pain was controlled with p.o. med.  She  had a T-max of 101.  This was rechecked and noted to be 99.4.  Vital signs  were, otherwise, stable.  Her splint was clean, dry and intact.  Minimal   edema of the digits.  Sensation and grip were noted to be intact.  IV  antibiotics were continued, as well as pain management.  Bedside incentive  spirometry was strictly encouraged and on July 02, 2006 the patient was doing  very well, stable without difficulties and decision made to discharge her  home.   ASSESSMENT/FINAL DIAGNOSIS:  Please see discharge diagnoses above.   CONDITION ON DISCHARGE:  Improved.   DIET:  Is regular.   ACTIVITY:  She will keep her splint clean, dry and intact.  Elevate  frequently.  No use of the upper extremity.  Move the digits frequently and  massage.   DISCHARGE MEDICATIONS:  Include:  1. Percocet one to two p.o. q.4-6.  2. Robaxin 500 mg one p.o. q.6.  3. Colace 1 p.o. b.i.d. to prevent postoperative incontinence.  4. Patient to resume her regular home medications.   FOLLOWUP:  Follow up with her regular medical doctors as scheduled.  She  will return to see Dr. Amanda Pea in 10 days.  All questions were encouraged and  answered.      Karie Chimera, P.A.-C.    ______________________________  Dionne Ano. Amanda Pea, M.D.    BB/MEDQ  D:  09/01/2006  T:  09/01/2006  Job:  [id number]

## 2011-05-14 NOTE — Consult Note (Signed)
NAMEKHRISTIE, Meghan Lynn               ACCOUNT NO.:  [id number]   MEDICAL RECORD NO.:  [id number]          PATIENT TYPE:  INP   LOCATION:  5019                         FACILITY:  [facility]   PHYSICIAN:  Lonia Blood, M.D.DATE OF BIRTH:  1951-04-10   DATE OF CONSULTATION:  06/29/2006  DATE OF DISCHARGE:                                   CONSULTATION   REASON FOR CONSULTATION:  Inpatient medical consultation/observant care.   HISTORY OF PRESENT ILLNESS:  Meghan Lynn is a 60 year old female who was  walking in her garage today when she slipped and fell.  She landed on her  right elbow sustaining a fracture.  She came to the emergency room  for  evaluation.  The fracture was diagnosed and the patient was taken to surgery  by Dr. Amanda Pea for correction.  She did specifically slip and fall and did  not have a syncopal spell.  The patient has undergone successful surgical  correction.  She is presently out of the PACU and recovering on unit 5000.  We are requested to evaluate the patient to simply monitor her medical  issues to help assure that she remains stable during her hospital stay.  The  patient, herself, is sedated at the present time from her anesthesia and  postoperative pain control and is, therefore, unable to provide any history  herself.   REVIEW OF SYSTEMS:  Comprehensive review of systems is unable to be  accomplished at the present time as the patient remains sedated from her  surgery.   PAST MEDICAL HISTORY (PER OLD RECORDS):  1.  Hypertension.  2.  Coronary artery disease with re-stents to the right coronary artery in      September 2004 and a diagnosed 60% left anterior descending  lesion with      preserved LV systolic function.  3.  Tobacco abuse with COPD.  4.  Status post carpal tunnel release in November 2003.  5.  Gastroesophageal reflux disease.  6.  Hypercholesterolemia.  7.  Status post bilateral tubal ligation.   OUTPATIENT MEDICATIONS:  1.  AcipHex 20  mg daily.  2.  Aspirin 81 mg daily.  3.  Diovan 320 mg daily.  4.  Lipitor 20 mg daily.  5.  Niaspan 500 mg daily.  6.  Paxil unknown dose daily.  7.  Plavix 75 mg daily.  8.  Sotalol 10 mg daily.  9.  Zyrtec 10 mg daily.   ALLERGIES:  DARVOCET.   FAMILY HISTORY:  Noncontributory this admission.   SOCIAL HISTORY:  The patient does not drink.  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  No other social history is able to be obtained as  the patient is presently sedated with pain medications.   DATA REVIEW:  Hemoglobin, white count, and platelet count are all normal,  MCV normal.  Sodium, potassium, chloride, BUN, and creatinine are normal.  Serum glucose is mildly elevated at 122.  Calcium 9.1.  INR 1, PTT 27.   PHYSICAL EXAMINATION:  VITAL SIGNS:  Afebrile, blood pressure 150/90, heart rate 100, respiratory  rate  20, O2 saturations 100% on 2 liters per minute nasal cannula.  GENERAL:  Well developed, well nourished female in no acute distress.  HEENT:  Normocephalic, atraumatic, pupils equal, round, reactive to light  and accommodation, extraocular movements intact bilaterally.  NECK:  No JVD, no lymphadenopathy.  LUNGS:  Clear to auscultation bilaterally without wheeze or rhonchi.  HEART:  Regular rate and rhythm without murmurs, gallops, and rubs with  normal S1 and S2.  ABDOMEN:  Nontender, nondistended, soft, bowel sounds present, no  hepatosplenomegaly, rebound, or ascites.  EXTREMITIES:  No cyanosis, clubbing, and edema.  NEUROLOGICAL:  Lethargic.  Cranial nerves 2-12 are intact, there is no  Babinski.   IMPRESSION AND PLAN:  1.  Right elbow fracture - as per primary service.  2.  Hypertension - I agree with the plan to resume the patient's home      medications.  We will follow her blood pressure with you and titrate      medications as appropriate.  3.  Hypercholesterolemia - I agree with your plan to resume the patient's      outpatient treatment  regimen.  4.  Coronary artery disease - at present there is no indication of acute      coronary syndrome or exacerbation of the patient's symptoms.  She is not      able to provide a history at the present time.  We will question her      during our next visit to assure that she is not suffering with symptoms      consistent with unstable angina pectoris.  5.  Gastroesophageal reflux disease - continue AcipHex as you are.  6.  Elevated serum glucose - the patient has no known history of diabetes      mellitus.  Serum glucose at the time of presentation was 122.  This      could represent a stress reaction to the acute fracture.  It is also      likely the patient was given dextrose containing IV fluids at the time      this was obtained.  We will check a hemoglobin A1C with the next blood      draw and we will follow the patient's CBG on a b.i.d. basis.   Thank you for your consultation on Meghan Lynn.  We will continue to follow  along with you.      Lonia Blood, M.D.  Electronically Signed     JTM/MEDQ  D:  06/29/2006  T:  06/29/2006  Job:  [id number]

## 2011-09-17 LAB — CBC
HCT: 35 — ABNORMAL LOW
HCT: 45
Hemoglobin: 11.3 — ABNORMAL LOW
MCHC: 31.4
MCV: 80.5
Platelets: 201
RBC: 5.59 — ABNORMAL HIGH
RDW: 13.6
RDW: 14
WBC: 8

## 2011-09-17 LAB — BASIC METABOLIC PANEL
Calcium: 8.5
Chloride: 108
Creatinine, Ser: 0.85
GFR calc Af Amer: >60
GFR calc Af Amer: >60
Glucose, Bld: 104 — ABNORMAL HIGH
Potassium: 3.8
Sodium: 143

## 2012-12-15 ENCOUNTER — Emergency Department (HOSPITAL_COMMUNITY): Admission: EM | Admit: 2012-12-15 | Discharge: 2012-12-15 | Discharge status: Against Medical Advice | Payer: Self-pay

## 2012-12-15 NOTE — ED Notes (Signed)
Pt seen leaving the ed

## 2012-12-16 ENCOUNTER — Encounter (HOSPITAL_COMMUNITY): Payer: Self-pay | Admitting: *Deleted

## 2012-12-16 ENCOUNTER — Emergency Department (HOSPITAL_COMMUNITY)
Admission: EM | Admit: 2012-12-16 | Discharge: 2012-12-16 | Disposition: A | Discharge status: Home / Self Care | Payer: 59 | Attending: Emergency Medicine | Admitting: Emergency Medicine

## 2012-12-16 ENCOUNTER — Emergency Department (HOSPITAL_COMMUNITY): Payer: 59

## 2012-12-16 DIAGNOSIS — J40 Bronchitis, not specified as acute or chronic: Secondary | ICD-10-CM | POA: Insufficient documentation

## 2012-12-16 DIAGNOSIS — Z8739 Personal history of other diseases of the musculoskeletal system and connective tissue: Secondary | ICD-10-CM | POA: Insufficient documentation

## 2012-12-16 DIAGNOSIS — R0602 Shortness of breath: Secondary | ICD-10-CM | POA: Insufficient documentation

## 2012-12-16 DIAGNOSIS — Z7982 Long term (current) use of aspirin: Secondary | ICD-10-CM | POA: Insufficient documentation

## 2012-12-16 DIAGNOSIS — I10 Essential (primary) hypertension: Secondary | ICD-10-CM | POA: Insufficient documentation

## 2012-12-16 DIAGNOSIS — I2510 Atherosclerotic heart disease of native coronary artery without angina pectoris: Secondary | ICD-10-CM | POA: Insufficient documentation

## 2012-12-16 DIAGNOSIS — R5381 Other malaise: Secondary | ICD-10-CM | POA: Insufficient documentation

## 2012-12-16 DIAGNOSIS — Z79899 Other long term (current) drug therapy: Secondary | ICD-10-CM | POA: Insufficient documentation

## 2012-12-16 DIAGNOSIS — R062 Wheezing: Secondary | ICD-10-CM | POA: Insufficient documentation

## 2012-12-16 DIAGNOSIS — F17200 Nicotine dependence, unspecified, uncomplicated: Secondary | ICD-10-CM | POA: Insufficient documentation

## 2012-12-16 DIAGNOSIS — J3489 Other specified disorders of nose and nasal sinuses: Secondary | ICD-10-CM | POA: Insufficient documentation

## 2012-12-16 DIAGNOSIS — IMO0002 Reserved for concepts with insufficient information to code with codable children: Secondary | ICD-10-CM | POA: Insufficient documentation

## 2012-12-16 HISTORY — DX: Essential (primary) hypertension: I10

## 2012-12-16 HISTORY — DX: Atherosclerotic heart disease of native coronary artery without angina pectoris: I25.10

## 2012-12-16 HISTORY — DX: Unspecified osteoarthritis, unspecified site: M19.90

## 2012-12-16 MED ORDER — ALBUTEROL SULFATE HFA 108 (90 BASE) MCG/ACT IN AERS: 1.0000 | INHALATION_SPRAY | Freq: Four times a day (QID) | RESPIRATORY_TRACT | Status: DC | PRN

## 2012-12-16 MED ORDER — PREDNISONE 10 MG PO TABS: 40.0000 mg | ORAL_TABLET | Freq: Every day | ORAL | Status: DC

## 2012-12-16 MED ORDER — GUAIFENESIN-CODEINE 100-10 MG/5ML PO SYRP: 5.0000 mL | ORAL_SOLUTION | Freq: Three times a day (TID) | ORAL | Status: DC | PRN

## 2012-12-16 MED ORDER — AZITHROMYCIN 250 MG PO TABS: 250.0000 mg | ORAL_TABLET | Freq: Every day | ORAL | Status: DC

## 2012-12-16 NOTE — ED Provider Notes (Signed)
History     CSN: [id number]  Arrival date & time 12/16/12  1048   First MD Initiated Contact with Patient 12/16/12 1112      Chief Complaint  Patient presents with  . Cough    (Consider location/radiation/quality/duration/timing/severity/associated sxs/prior treatment) HPI Comments: Patient with a three-month history of intermittent cough states that her cough has been markings worsening of the last 3 days. She states that it's nonproductive with yellow mucus. She saw her in her chest and her upper abdomen from coughing spells. She denies any other pleuritic-type chest pain. She has shortness of breath during the bad coughing spells but no other shortness of breath. She denies any leg pain or swelling. She denies any known fevers. She denies any vomiting or diarrhea. She has used albuterol and Advair in the past but hasn't used any in the last few years. She does smoke cigarettes.she's been using over-the-counter cough medicines without relief  Patient is a 61 y.o. female presenting with cough.  Cough Associated symptoms include shortness of breath and wheezing. Pertinent negatives include no chest pain, no chills, no headaches and no rhinorrhea.    Past Medical History  Diagnosis Date  . Coronary artery disease   . Hypertension   . Arthritis     History reviewed. No pertinent past surgical history.  No family history on file.  History  Substance Use Topics  . Smoking status: Current Every Day Smoker  . Smokeless tobacco: Not on file  . Alcohol Use: No    OB History    Grav Para Term Preterm Abortions TAB SAB Ect Mult Living                  Review of Systems  Constitutional: Positive for fatigue. Negative for fever, chills and diaphoresis.  HENT: Positive for congestion. Negative for rhinorrhea and sneezing.   Eyes: Negative.   Respiratory: Positive for cough, shortness of breath and wheezing. Negative for chest tightness.   Cardiovascular: Negative for chest pain  and leg swelling.  Gastrointestinal: Negative for nausea, vomiting, abdominal pain, diarrhea and blood in stool.  Genitourinary: Negative for frequency, hematuria, flank pain and difficulty urinating.  Musculoskeletal: Negative for back pain and arthralgias.  Skin: Negative for rash.  Neurological: Negative for dizziness, speech difficulty, weakness, numbness and headaches.    Allergies  Review of patient's allergies indicates no known allergies.  Home Medications   Current Outpatient Rx  Name  Route  Sig  Dispense  Refill  . ASPIRIN EC 81 MG PO TBEC   Oral   Take 81 mg by mouth daily.         . ATORVASTATIN CALCIUM 20 MG PO TABS   Oral   Take 20 mg by mouth daily.         [name] Kitchen HYDROCODONE-ACETAMINOPHEN 5-325 MG PO TABS   Oral   Take 1-2 tablets by mouth every 6 (six) hours as needed. For pain.         [name] Kitchen METOPROLOL SUCCINATE ER 25 MG PO TB24   Oral   Take 25 mg by mouth at bedtime.         . ADULT MULTIVITAMIN W/MINERALS CH   Oral   Take 1 tablet by mouth daily.         [name] Kitchen NIACIN ER (ANTIHYPERLIPIDEMIC) 500 MG PO TBCR   Oral   Take 500 mg by mouth at bedtime.         [name] Kitchen RABEPRAZOLE SODIUM 20 MG PO TBEC  Oral   Take 20 mg by mouth daily.         [name] Kitchen VALSARTAN 320 MG PO TABS   Oral   Take 320 mg by mouth at bedtime.         . ALBUTEROL SULFATE HFA 108 (90 BASE) MCG/ACT IN AERS   Inhalation   Inhale 1-2 puffs into the lungs every 6 (six) hours as needed for wheezing.   1 Inhaler   0   . AZITHROMYCIN 250 MG PO TABS   Oral   Take 1 tablet (250 mg total) by mouth daily. Take first 2 tablets together, then 1 every day until finished.   6 tablet   0   . GUAIFENESIN-CODEINE 100-10 MG/5ML PO SYRP   Oral   Take 5 mLs by mouth 3 (three) times daily as needed for cough.   120 mL   0   . PREDNISONE 10 MG PO TABS   Oral   Take 4 tablets (40 mg total) by mouth daily.   20 tablet   0     BP 158/95  Pulse 106  Temp 98.9 F (37.2 C) (Oral)  Resp 22   Ht 5\' 1"  (1.549 m)  Wt 136 lb (61.689 kg)  BMI 25.70 kg/m2  SpO2 96%  Physical Exam  Constitutional: She is oriented to person, place, and time. She appears well-developed and well-nourished.  HENT:  Head: Normocephalic and atraumatic.  Right Ear: External ear normal.  Left Ear: External ear normal.  Mouth/Throat: Oropharynx is clear and moist.  Eyes: Pupils are equal, round, and reactive to light.  Neck: Normal range of motion. Neck supple.  Cardiovascular: Normal rate, regular rhythm and normal heart sounds.   Pulmonary/Chest: Effort normal. No respiratory distress. She has wheezes (slight bilateral expiratory wheezing). She has no rales. She exhibits no tenderness.  Abdominal: Soft. Bowel sounds are normal. There is no tenderness. There is no rebound and no guarding.  Musculoskeletal: Normal range of motion. She exhibits no edema.  Lymphadenopathy:    She has no cervical adenopathy.  Neurological: She is alert and oriented to person, place, and time.  Skin: Skin is warm and dry. No rash noted.  Psychiatric: She has a normal mood and affect.    ED Course  Procedures (including critical care time)   Dg Chest 2 View  12/16/2012  *RADIOLOGY REPORT*  Clinical Data: , shortness of breath  CHEST - 2 VIEW  Comparison: Chest x-ray of 02/06/2008  Findings: No active infiltrate or effusion is seen.  There is mild peribronchial thickening and bronchitis is a consideration. Mediastinal contours are stable.  The heart is within upper limits normal.  No bony abnormality is seen.  IMPRESSION: No pneumonia.  Question bronchitis.   Original Report Authenticated By: Dwyane Dee, M.D.      1. Bronchitis       MDM  Pt with worsening bronchitis like symptoms.  No hypoxia.  No pneumonia.  No suggestion of PE.  Will start zithromax, prednisone, albuterol MDI, cough syrup.  F/u with PMD or return here as needed        Rolan Bucco, MD 12/16/12 1243

## 2012-12-16 NOTE — ED Notes (Signed)
Pt c/o of coughing x1 week, has increased in the last three days, c/o of sob and abdominal pain when she has "coughing fits", sts shes been running a low grade fever, denies n/v/d

## 2013-01-08 ENCOUNTER — Other Ambulatory Visit: Payer: Self-pay | Admitting: Neurological Surgery

## 2013-01-16 ENCOUNTER — Encounter (HOSPITAL_COMMUNITY): Payer: Self-pay | Admitting: Pharmacy Technician

## 2013-01-17 NOTE — Pre-Procedure Instructions (Signed)
Meghan Lynn  01/17/2013   Your procedure is scheduled on:  Friday,January 31st.  Report to [facility] at 7:30 AM.  Call this number if you have problems the morning of surgery: [phone]   Remember:   Do not eat food or drink liquids after midnight.   Take these medicines the morning of surgery with A SIP OF WATER:  Rabeprazole (Aciphex).   Stop taking Goody Headache Powder Friday, January 24th.   Do not wear jewelry, make-up or nail polish.  Do not wear lotions, powders, or perfumes. You may wear deodorant.  Do not shave 48 hours prior to surgery. Men may shave face and neck.  Do not bring valuables to the hospital.  Contacts, dentures or bridgework may not be worn into surgery.  Leave suitcase in the car. After surgery it may be brought to your room.  For patients admitted to the hospital, checkout time is 11:00 AM the day of  discharge.   Patients discharged the day of surgery will not be allowed to drive  home.  Name and phone number of your driver: -    Special Instructions: Shower using CHG 2 nights before surgery and the night before surgery.  If you shower the day of surgery use CHG.  Use special wash - you have one bottle of CHG for all showers.  You should use approximately 1/3 of the bottle for each shower.   Please read over the following fact sheets that you were given: Pain Booklet, Coughing and Deep Breathing and Surgical Site Infection Prevention

## 2013-01-18 ENCOUNTER — Encounter (HOSPITAL_COMMUNITY)
Admission: RE | Admit: 2013-01-18 | Discharge: 2013-01-18 | Disposition: A | Discharge status: Home / Self Care | Payer: 59 | Source: Ambulatory Visit | Attending: Neurological Surgery | Admitting: Neurological Surgery

## 2013-01-18 ENCOUNTER — Encounter (HOSPITAL_COMMUNITY): Payer: Self-pay

## 2013-01-18 HISTORY — DX: Headache: R51

## 2013-01-18 HISTORY — DX: Hyperlipidemia, unspecified: E78.5

## 2013-01-18 HISTORY — DX: Migraine, unspecified, not intractable, without status migrainosus: G43.909

## 2013-01-18 HISTORY — DX: Gastro-esophageal reflux disease without esophagitis: K21.9

## 2013-01-18 HISTORY — DX: Bronchitis, not specified as acute or chronic: J40

## 2013-01-18 HISTORY — DX: Nicotine dependence, unspecified, uncomplicated: F17.200

## 2013-01-18 HISTORY — DX: Tobacco use: Z72.0

## 2013-01-18 LAB — CBC WITH DIFFERENTIAL/PLATELET
Basophils Relative: 1 % (ref 0–1)
HCT: 47.2 % — ABNORMAL HIGH (ref 36.0–46.0)
Hemoglobin: 16 g/dL — ABNORMAL HIGH (ref 12.0–15.0)
Lymphs Abs: 3 10*3/uL (ref 0.7–4.0)
MCH: 26.6 pg (ref 26.0–34.0)
MCHC: 33.9 g/dL (ref 30.0–36.0)
Monocytes Absolute: 0.6 10*3/uL (ref 0.1–1.0)
Monocytes Relative: 6 % (ref 3–12)
Neutro Abs: 6.9 10*3/uL (ref 1.7–7.7)
RBC: 6.02 MIL/uL — ABNORMAL HIGH (ref 3.87–5.11)

## 2013-01-18 LAB — BASIC METABOLIC PANEL
BUN: 10 mg/dL (ref 6–23)
Chloride: 107 mEq/L (ref 96–112)
GFR calc Af Amer: >90 mL/min (ref >90)
Glucose, Bld: 98 mg/dL (ref 70–99)
Potassium: 3.9 mEq/L (ref 3.5–5.1)
Sodium: 141 mEq/L (ref 135–145)

## 2013-01-18 LAB — PROTIME-INR: INR: 0.99 (ref 0.00–1.49)

## 2013-01-18 LAB — ABO/RH: ABO/RH(D): O POS

## 2013-01-25 MED ORDER — CEFAZOLIN SODIUM-DEXTROSE 2-3 GM-% IV SOLR
2.0000 g | INTRAVENOUS | Status: AC
  Administered 2013-01-26: 2 g via INTRAVENOUS
  Filled 2013-01-25: qty 50

## 2013-01-25 MED ORDER — DEXAMETHASONE SODIUM PHOSPHATE 10 MG/ML IJ SOLN
10.0000 mg | INTRAMUSCULAR | Status: AC
  Administered 2013-01-26: 10 mg via INTRAVENOUS
  Filled 2013-01-25: qty 1

## 2013-01-26 ENCOUNTER — Ambulatory Visit (HOSPITAL_COMMUNITY): Payer: 59 | Admitting: Anesthesiology

## 2013-01-26 ENCOUNTER — Encounter (HOSPITAL_COMMUNITY): Payer: Self-pay | Admitting: Anesthesiology

## 2013-01-26 ENCOUNTER — Encounter (HOSPITAL_COMMUNITY): Payer: Self-pay | Admitting: *Deleted

## 2013-01-26 ENCOUNTER — Inpatient Hospital Stay (HOSPITAL_COMMUNITY)
Admission: RE | Admit: 2013-01-26 | Discharge: 2013-01-28 | DRG: 460 | Disposition: A | Discharge status: Home / Self Care | Payer: 59 | Source: Ambulatory Visit | Attending: Neurological Surgery | Admitting: Neurological Surgery

## 2013-01-26 ENCOUNTER — Inpatient Hospital Stay (HOSPITAL_COMMUNITY): Payer: 59

## 2013-01-26 ENCOUNTER — Encounter (HOSPITAL_COMMUNITY)
Admission: RE | Disposition: A | Discharge status: Home / Self Care | Payer: Self-pay | Source: Ambulatory Visit | Attending: Neurological Surgery

## 2013-01-26 DIAGNOSIS — E785 Hyperlipidemia, unspecified: Secondary | ICD-10-CM | POA: Diagnosis present

## 2013-01-26 DIAGNOSIS — M4316 Spondylolisthesis, lumbar region: Secondary | ICD-10-CM

## 2013-01-26 DIAGNOSIS — G43909 Migraine, unspecified, not intractable, without status migrainosus: Secondary | ICD-10-CM | POA: Diagnosis present

## 2013-01-26 DIAGNOSIS — I10 Essential (primary) hypertension: Secondary | ICD-10-CM | POA: Diagnosis present

## 2013-01-26 DIAGNOSIS — I2510 Atherosclerotic heart disease of native coronary artery without angina pectoris: Secondary | ICD-10-CM | POA: Diagnosis present

## 2013-01-26 DIAGNOSIS — Z01812 Encounter for preprocedural laboratory examination: Secondary | ICD-10-CM

## 2013-01-26 DIAGNOSIS — Z01810 Encounter for preprocedural cardiovascular examination: Secondary | ICD-10-CM

## 2013-01-26 DIAGNOSIS — F17200 Nicotine dependence, unspecified, uncomplicated: Secondary | ICD-10-CM | POA: Diagnosis present

## 2013-01-26 DIAGNOSIS — Q762 Congenital spondylolisthesis: Principal | ICD-10-CM

## 2013-01-26 DIAGNOSIS — Z9861 Coronary angioplasty status: Secondary | ICD-10-CM

## 2013-01-26 DIAGNOSIS — K219 Gastro-esophageal reflux disease without esophagitis: Secondary | ICD-10-CM | POA: Diagnosis present

## 2013-01-26 DIAGNOSIS — M48061 Spinal stenosis, lumbar region without neurogenic claudication: Secondary | ICD-10-CM | POA: Diagnosis present

## 2013-01-26 DIAGNOSIS — M129 Arthropathy, unspecified: Secondary | ICD-10-CM | POA: Diagnosis present

## 2013-01-26 SURGERY — FOR MAXIMUM ACCESS (MAS) POSTERIOR LUMBAR INTERBODY FUSION (PLIF) 1 LEVEL
Anesthesia: General | Site: Back | Wound class: Clean

## 2013-01-26 MED ORDER — SODIUM CHLORIDE 0.9 % IV SOLN: 250.0000 mL | INTRAVENOUS | Status: DC

## 2013-01-26 MED ORDER — NEOSTIGMINE METHYLSULFATE 1 MG/ML IJ SOLN
INTRAMUSCULAR | Status: DC | PRN
  Administered 2013-01-26: 3 mg via INTRAVENOUS

## 2013-01-26 MED ORDER — MENTHOL 3 MG MT LOZG: 1.0000 | LOZENGE | OROMUCOSAL | Status: DC | PRN

## 2013-01-26 MED ORDER — EPHEDRINE SULFATE 50 MG/ML IJ SOLN
INTRAMUSCULAR | Status: DC | PRN
  Administered 2013-01-26: 10 mg via INTRAVENOUS

## 2013-01-26 MED ORDER — METHOCARBAMOL 100 MG/ML IJ SOLN
500.0000 mg | INTRAVENOUS | Status: AC
  Administered 2013-01-26: 500 mg via INTRAVENOUS
  Filled 2013-01-26: qty 5

## 2013-01-26 MED ORDER — METHOCARBAMOL 500 MG PO TABS
500.0000 mg | ORAL_TABLET | Freq: Four times a day (QID) | ORAL | Status: DC | PRN
  Administered 2013-01-26 – 2013-01-28 (×4): 500 mg via ORAL
  Filled 2013-01-26 (×5): qty 1

## 2013-01-26 MED ORDER — IPRATROPIUM-ALBUTEROL 18-103 MCG/ACT IN AERO
1.0000 | INHALATION_SPRAY | Freq: Four times a day (QID) | RESPIRATORY_TRACT | Status: DC | PRN
  Administered 2013-01-27 (×2): 1 via RESPIRATORY_TRACT
  Filled 2013-01-26: qty 14.7

## 2013-01-26 MED ORDER — OXYCODONE-ACETAMINOPHEN 5-325 MG PO TABS
1.0000 | ORAL_TABLET | ORAL | Status: DC | PRN
  Administered 2013-01-26: 1 via ORAL
  Administered 2013-01-27 – 2013-01-28 (×2): 2 via ORAL
  Filled 2013-01-26 (×2): qty 2
  Filled 2013-01-26: qty 1

## 2013-01-26 MED ORDER — DEXAMETHASONE 4 MG PO TABS
4.0000 mg | ORAL_TABLET | Freq: Four times a day (QID) | ORAL | Status: DC
  Administered 2013-01-26 – 2013-01-28 (×6): 4 mg via ORAL
  Filled 2013-01-26 (×11): qty 1

## 2013-01-26 MED ORDER — SODIUM CHLORIDE 0.9 % IV SOLN
INTRAVENOUS | Status: AC
  Filled 2013-01-26: qty 500

## 2013-01-26 MED ORDER — HYDROMORPHONE HCL PF 1 MG/ML IJ SOLN
INTRAMUSCULAR | Status: AC
  Administered 2013-01-26: 0.5 mg via INTRAVENOUS
  Filled 2013-01-26: qty 1

## 2013-01-26 MED ORDER — METHOCARBAMOL 100 MG/ML IJ SOLN
500.0000 mg | Freq: Four times a day (QID) | INTRAVENOUS | Status: DC | PRN
  Filled 2013-01-26: qty 5

## 2013-01-26 MED ORDER — MIDAZOLAM HCL 5 MG/5ML IJ SOLN
INTRAMUSCULAR | Status: DC | PRN
  Administered 2013-01-26: 2 mg via INTRAVENOUS

## 2013-01-26 MED ORDER — IRBESARTAN 75 MG PO TABS
75.0000 mg | ORAL_TABLET | Freq: Every day | ORAL | Status: DC
  Administered 2013-01-26 – 2013-01-27 (×2): 75 mg via ORAL
  Filled 2013-01-26 (×3): qty 1

## 2013-01-26 MED ORDER — PANTOPRAZOLE SODIUM 40 MG PO TBEC
40.0000 mg | DELAYED_RELEASE_TABLET | Freq: Every day | ORAL | Status: DC
  Administered 2013-01-26 – 2013-01-27 (×2): 40 mg via ORAL
  Filled 2013-01-26 (×2): qty 1

## 2013-01-26 MED ORDER — 0.9 % SODIUM CHLORIDE (POUR BTL) OPTIME
TOPICAL | Status: DC | PRN
  Administered 2013-01-26: 1000 mL

## 2013-01-26 MED ORDER — SODIUM CHLORIDE 0.9 % IJ SOLN: 3.0000 mL | INTRAMUSCULAR | Status: DC | PRN

## 2013-01-26 MED ORDER — ACETAMINOPHEN 650 MG RE SUPP: 650.0000 mg | RECTAL | Status: DC | PRN

## 2013-01-26 MED ORDER — HYDROMORPHONE HCL PF 1 MG/ML IJ SOLN
0.2500 mg | INTRAMUSCULAR | Status: DC | PRN
  Administered 2013-01-26 (×2): 0.5 mg via INTRAVENOUS

## 2013-01-26 MED ORDER — PROPOFOL INFUSION 10 MG/ML OPTIME
INTRAVENOUS | Status: DC | PRN
  Administered 2013-01-26: 100 ug/kg/min via INTRAVENOUS

## 2013-01-26 MED ORDER — POTASSIUM CHLORIDE IN NACL 20-0.9 MEQ/L-% IV SOLN
INTRAVENOUS | Status: DC
  Administered 2013-01-26: 17:00:00 via INTRAVENOUS
  Filled 2013-01-26 (×4): qty 1000

## 2013-01-26 MED ORDER — SODIUM CHLORIDE 0.9 % IJ SOLN
3.0000 mL | Freq: Two times a day (BID) | INTRAMUSCULAR | Status: DC
  Administered 2013-01-27: 3 mL via INTRAVENOUS

## 2013-01-26 MED ORDER — GLYCOPYRROLATE 0.2 MG/ML IJ SOLN
INTRAMUSCULAR | Status: DC | PRN
  Administered 2013-01-26: .6 mg via INTRAVENOUS

## 2013-01-26 MED ORDER — LACTATED RINGERS IV SOLN
INTRAVENOUS | Status: DC | PRN
  Administered 2013-01-26 (×2): via INTRAVENOUS

## 2013-01-26 MED ORDER — PROPOFOL 10 MG/ML IV BOLUS
INTRAVENOUS | Status: DC | PRN
  Administered 2013-01-26: 1 mg via INTRAVENOUS

## 2013-01-26 MED ORDER — THROMBIN 20000 UNITS EX SOLR
CUTANEOUS | Status: DC | PRN
  Administered 2013-01-26: 11:00:00 via TOPICAL

## 2013-01-26 MED ORDER — CEFAZOLIN SODIUM 1-5 GM-% IV SOLN
1.0000 g | Freq: Three times a day (TID) | INTRAVENOUS | Status: AC
  Administered 2013-01-26 – 2013-01-27 (×2): 1 g via INTRAVENOUS
  Filled 2013-01-26 (×4): qty 50

## 2013-01-26 MED ORDER — ONDANSETRON HCL 4 MG/2ML IJ SOLN
INTRAMUSCULAR | Status: DC | PRN
  Administered 2013-01-26: 4 mg via INTRAVENOUS

## 2013-01-26 MED ORDER — METOPROLOL SUCCINATE ER 25 MG PO TB24
25.0000 mg | ORAL_TABLET | Freq: Every day | ORAL | Status: DC
  Administered 2013-01-26 – 2013-01-27 (×2): 25 mg via ORAL
  Filled 2013-01-26 (×3): qty 1

## 2013-01-26 MED ORDER — BUPIVACAINE HCL (PF) 0.25 % IJ SOLN
INTRAMUSCULAR | Status: DC | PRN
  Administered 2013-01-26: 10 mL
  Administered 2013-01-26: 6 mL

## 2013-01-26 MED ORDER — LIDOCAINE HCL (CARDIAC) 20 MG/ML IV SOLN
INTRAVENOUS | Status: DC | PRN
  Administered 2013-01-26: 50 mg via INTRAVENOUS

## 2013-01-26 MED ORDER — ONDANSETRON HCL 4 MG/2ML IJ SOLN: 4.0000 mg | INTRAMUSCULAR | Status: DC | PRN

## 2013-01-26 MED ORDER — FENTANYL CITRATE 0.05 MG/ML IJ SOLN
INTRAMUSCULAR | Status: DC | PRN
  Administered 2013-01-26: 150 ug via INTRAVENOUS
  Administered 2013-01-26: 100 ug via INTRAVENOUS

## 2013-01-26 MED ORDER — ASPIRIN EC 81 MG PO TBEC
81.0000 mg | DELAYED_RELEASE_TABLET | Freq: Every morning | ORAL | Status: DC
  Administered 2013-01-26 – 2013-01-27 (×2): 81 mg via ORAL
  Filled 2013-01-26 (×3): qty 1

## 2013-01-26 MED ORDER — SENNA 8.6 MG PO TABS
1.0000 | ORAL_TABLET | Freq: Two times a day (BID) | ORAL | Status: DC
  Administered 2013-01-26 – 2013-01-27 (×3): 8.6 mg via ORAL
  Filled 2013-01-26 (×7): qty 1

## 2013-01-26 MED ORDER — CELECOXIB 200 MG PO CAPS
200.0000 mg | ORAL_CAPSULE | Freq: Two times a day (BID) | ORAL | Status: DC
  Administered 2013-01-26 – 2013-01-27 (×3): 200 mg via ORAL
  Filled 2013-01-26 (×5): qty 1

## 2013-01-26 MED ORDER — ALBUTEROL SULFATE HFA 108 (90 BASE) MCG/ACT IN AERS
INHALATION_SPRAY | RESPIRATORY_TRACT | Status: DC | PRN
  Administered 2013-01-26: 2 via RESPIRATORY_TRACT

## 2013-01-26 MED ORDER — MORPHINE SULFATE 2 MG/ML IJ SOLN: 1.0000 mg | INTRAMUSCULAR | Status: DC | PRN

## 2013-01-26 MED ORDER — DEXAMETHASONE SODIUM PHOSPHATE 4 MG/ML IJ SOLN
4.0000 mg | Freq: Four times a day (QID) | INTRAMUSCULAR | Status: DC
  Filled 2013-01-26 (×9): qty 1

## 2013-01-26 MED ORDER — SODIUM CHLORIDE 0.9 % IR SOLN
Status: DC | PRN
  Administered 2013-01-26: 11:00:00

## 2013-01-26 MED ORDER — BACITRACIN 50000 UNITS IM SOLR
INTRAMUSCULAR | Status: AC
  Filled 2013-01-26: qty 1

## 2013-01-26 MED ORDER — NIACIN ER (ANTIHYPERLIPIDEMIC) 500 MG PO TBCR
500.0000 mg | EXTENDED_RELEASE_TABLET | Freq: Every day | ORAL | Status: DC
  Administered 2013-01-26 – 2013-01-27 (×2): 500 mg via ORAL
  Filled 2013-01-26 (×3): qty 1

## 2013-01-26 MED ORDER — PHENOL 1.4 % MT LIQD: 1.0000 | OROMUCOSAL | Status: DC | PRN

## 2013-01-26 MED ORDER — ACETAMINOPHEN 325 MG PO TABS: 650.0000 mg | ORAL_TABLET | ORAL | Status: DC | PRN

## 2013-01-26 MED ORDER — ACETAMINOPHEN 10 MG/ML IV SOLN
1000.0000 mg | Freq: Four times a day (QID) | INTRAVENOUS | Status: AC
  Administered 2013-01-26 – 2013-01-27 (×4): 1000 mg via INTRAVENOUS
  Filled 2013-01-26 (×4): qty 100

## 2013-01-26 SURGICAL SUPPLY — 66 items
APL SKNCLS STERI-STRIP NONHPOA (GAUZE/BANDAGES/DRESSINGS) ×1
BAG DECANTER FOR FLEXI CONT (MISCELLANEOUS) ×2 IMPLANT
BENZOIN TINCTURE PRP APPL 2/3 (GAUZE/BANDAGES/DRESSINGS) ×2 IMPLANT
BLADE SURG ROTATE 9660 (MISCELLANEOUS) IMPLANT
BONE MATRIX OSTEOCEL PLUS 5CC (Bone Implant) ×1 IMPLANT
BUR MATCHSTICK NEURO 3.0 LAGG (BURR) ×2 IMPLANT
CAGE COROENT 9X9X23-4 (Cage) ×2 IMPLANT
CANISTER SUCTION 2500CC (MISCELLANEOUS) ×2 IMPLANT
CLIP NEUROVISION LG (CLIP) ×2 IMPLANT
CLOTH BEACON ORANGE TIMEOUT ST (SAFETY) ×2 IMPLANT
CONT SPEC 4OZ CLIKSEAL STRL BL (MISCELLANEOUS) ×4 IMPLANT
COVER BACK TABLE 24X17X13 BIG (DRAPES) IMPLANT
COVER TABLE BACK 60X90 (DRAPES) ×2 IMPLANT
DRAPE C-ARM 42X72 X-RAY (DRAPES) ×2 IMPLANT
DRAPE C-ARMOR (DRAPES) ×2 IMPLANT
DRAPE LAPAROTOMY 100X72X124 (DRAPES) ×2 IMPLANT
DRAPE POUCH INSTRU U-SHP 10X18 (DRAPES) ×2 IMPLANT
DRAPE SURG 17X23 STRL (DRAPES) ×2 IMPLANT
DRESSING TELFA 8X3 (GAUZE/BANDAGES/DRESSINGS) ×2 IMPLANT
DRSG OPSITE 4X5.5 SM (GAUZE/BANDAGES/DRESSINGS) ×3 IMPLANT
DURAPREP 26ML APPLICATOR (WOUND CARE) ×2 IMPLANT
ELECT REM PT RETURN 9FT ADLT (ELECTROSURGICAL) ×2
ELECTRODE REM PT RTRN 9FT ADLT (ELECTROSURGICAL) ×1 IMPLANT
EVACUATOR 1/8 PVC DRAIN (DRAIN) ×2 IMPLANT
GAUZE SPONGE 4X4 16PLY XRAY LF (GAUZE/BANDAGES/DRESSINGS) IMPLANT
GLOVE BIO SURGEON STRL SZ8 (GLOVE) ×4 IMPLANT
GLOVE BIOGEL PI IND STRL 7.0 (GLOVE) IMPLANT
GLOVE BIOGEL PI INDICATOR 7.0 (GLOVE) ×1
GLOVE ECLIPSE 7.5 STRL STRAW (GLOVE) ×2 IMPLANT
GLOVE ECLIPSE 8.5 STRL (GLOVE) ×1 IMPLANT
GLOVE INDICATOR 7.5 STRL GRN (GLOVE) ×1 IMPLANT
GLOVE SS BIOGEL STRL SZ 6.5 (GLOVE) IMPLANT
GLOVE SUPERSENSE BIOGEL SZ 6.5 (GLOVE) ×3
GOWN BRE IMP SLV AUR LG STRL (GOWN DISPOSABLE) ×1 IMPLANT
GOWN BRE IMP SLV AUR XL STRL (GOWN DISPOSABLE) ×3 IMPLANT
GOWN STRL REIN 2XL LVL4 (GOWN DISPOSABLE) ×1 IMPLANT
HEMOSTAT POWDER KIT SURGIFOAM (HEMOSTASIS) IMPLANT
KIT BASIN OR (CUSTOM PROCEDURE TRAY) ×2 IMPLANT
KIT NDL NVM5 EMG ELECT (KITS) IMPLANT
KIT NEEDLE NVM5 EMG ELECT (KITS) ×1 IMPLANT
KIT NEEDLE NVM5 EMG ELECTRODE (KITS) ×1
KIT ROOM TURNOVER OR (KITS) ×2 IMPLANT
MILL MEDIUM DISP (BLADE) ×1 IMPLANT
NDL HYPO 25X1 1.5 SAFETY (NEEDLE) ×1 IMPLANT
NEEDLE HYPO 25X1 1.5 SAFETY (NEEDLE) ×2 IMPLANT
NS IRRIG 1000ML POUR BTL (IV SOLUTION) ×2 IMPLANT
PACK LAMINECTOMY NEURO (CUSTOM PROCEDURE TRAY) ×2 IMPLANT
PAD ARMBOARD 7.5X6 YLW CONV (MISCELLANEOUS) ×6 IMPLANT
ROD 5.5X40MM (Rod) ×2 IMPLANT
SCREW LOCK (Screw) ×8 IMPLANT
SCREW LOCK FXNS SPNE MAS PL (Screw) IMPLANT
SCREW SHANK 5.0X30MM (Screw) ×2 IMPLANT
SCREW TULIP 5.5 (Screw) ×2 IMPLANT
SPONGE LAP 4X18 X RAY DECT (DISPOSABLE) IMPLANT
SPONGE SURGIFOAM ABS GEL 100 (HEMOSTASIS) ×2 IMPLANT
STRIP CLOSURE SKIN 1/2X4 (GAUZE/BANDAGES/DRESSINGS) ×3 IMPLANT
SUT VIC AB 0 CT1 18XCR BRD8 (SUTURE) ×1 IMPLANT
SUT VIC AB 0 CT1 8-18 (SUTURE) ×2
SUT VIC AB 2-0 CP2 18 (SUTURE) ×2 IMPLANT
SUT VIC AB 3-0 SH 8-18 (SUTURE) ×4 IMPLANT
SYR 20ML ECCENTRIC (SYRINGE) ×2 IMPLANT
TOWEL OR 17X24 6PK STRL BLUE (TOWEL DISPOSABLE) ×2 IMPLANT
TOWEL OR 17X26 10 PK STRL BLUE (TOWEL DISPOSABLE) ×2 IMPLANT
TRAP SPECIMEN MUCOUS 40CC (MISCELLANEOUS) IMPLANT
TRAY FOLEY CATH 14FRSI W/METER (CATHETERS) ×2 IMPLANT
WATER STERILE IRR 1000ML POUR (IV SOLUTION) ×2 IMPLANT

## 2013-01-26 NOTE — Anesthesia Postprocedure Evaluation (Signed)
  Anesthesia Post-op Note  Patient: Meghan Lynn  Procedure(s) Performed: Procedure(s) (LRB) with comments: MAXIMUM ACCESS (MAS) POSTERIOR LUMBAR INTERBODY FUSION (PLIF) 1 LEVEL (N/A) - Lumbar three-four maximum access surgery posterior lumbar interbody fusion  Patient Location: PACU  Anesthesia Type:General  Level of Consciousness: awake  Airway and Oxygen Therapy: Patient Spontanous Breathing  Post-op Pain: mild  Post-op Assessment: Post-op Vital signs reviewed  Post-op Vital Signs: Reviewed  Complications: No apparent anesthesia complications

## 2013-01-26 NOTE — Progress Notes (Signed)
Patient admitted from PACU, report received from [facility].  Patient is alert and oriented, VSS.  Patient oriented to room and unit.  Call bell within reach, bed in low position, bed alarm on, will continue to monitor.

## 2013-01-26 NOTE — Anesthesia Preprocedure Evaluation (Addendum)
Anesthesia Evaluation  Patient identified by MRN, date of birth, ID band  Reviewed: Allergy & Precautions, H&P , NPO status   Airway Mallampati: II      Dental   Pulmonary  Recent bronchitis treated. No SOB or new symtoms.  CE breath sounds clear to auscultation        Cardiovascular hypertension, + angina + CAD and + Cardiac Stents Rhythm:Regular Rate:Normal     Neuro/Psych    GI/Hepatic Neg liver ROS, GERD-  Controlled,  Endo/Other  negative endocrine ROS  Renal/GU negative Renal ROS     Musculoskeletal   Abdominal   Peds  Hematology   Anesthesia Other Findings   Reproductive/Obstetrics                          Anesthesia Physical Anesthesia Plan  ASA: III  Anesthesia Plan: General   Post-op Pain Management:    Induction: Intravenous  Airway Management Planned: Oral ETT  Additional Equipment:   Intra-op Plan:   Post-operative Plan: Possible Post-op intubation/ventilation  Informed Consent:   Dental advisory given  Plan Discussed with: Anesthesiologist, CRNA and Surgeon  Anesthesia Plan Comments:         Anesthesia Quick Evaluation

## 2013-01-26 NOTE — H&P (Signed)
Subjective: Patient is a 62 y.o. female admitted for PLIF L3-4. Onset of symptoms was several years ago, gradually worsening since that time.  The pain is rated severe, and is located at the across the lower back and radiates to legs. The pain is described as aching and stabbing and occurs all day. The symptoms have been progressive. Symptoms are exacerbated by exercise and standing. 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   Past Medical History  Diagnosis Date  . Coronary artery disease   . Hypertension   . Arthritis   . Hyperlipidemia   . Active smoker   . Bronchitis due to tobacco use   . GERD (gastroesophageal reflux disease)   . Headache   . Headache, migraine     daily    Past Surgical History  Procedure Date  . Orif elbow fracture 2006  . Coronary angioplasty 2004    stents x 3  . Fracture surgery   . Cardiovascular stress test 2010    Prior to Admission medications   Medication Sig Start Date End Date Taking? Authorizing Provider  aspirin EC 81 MG tablet Take 81 mg by mouth every morning.    Yes Historical Provider, MD  Aspirin-Acetaminophen-Caffeine (GOODY HEADACHE PO) Take 1 packet by mouth daily as needed. For pain   Yes Historical Provider, MD  atorvastatin (LIPITOR) 20 MG tablet Take 20 mg by mouth at bedtime.    Yes Historical Provider, MD  metoprolol succinate (TOPROL-XL) 25 MG 24 hr tablet Take 25 mg by mouth at bedtime.   Yes Historical Provider, MD  Multiple Vitamin (MULTIVITAMIN WITH MINERALS) TABS Take 1 tablet by mouth daily.   Yes Historical Provider, MD  niacin (NIASPAN) 500 MG CR tablet Take 500 mg by mouth at bedtime.   Yes Historical Provider, MD  RABEprazole (ACIPHEX) 20 MG tablet Take 20 mg by mouth every morning.   Yes Historical Provider, MD  valsartan (DIOVAN) 320 MG tablet Take 320 mg by mouth at bedtime.   Yes Historical Provider, MD   No Known Allergies  History  Substance Use Topics  . Smoking status: Current Every Day Smoker  -- 1.0 packs/day for 45 years    Types: Cigarettes  . Smokeless tobacco: Never Used  . Alcohol Use: No    History reviewed. No pertinent family history.   Review of Systems  Positive ROS: neg  All other systems have been reviewed and were otherwise negative with the exception of those mentioned in the HPI and as above.  Objective: Vital signs in last 24 hours: Temp:  [98.4 F (36.9 C)] 98.4 F (36.9 C) (01/31 0731) Pulse Rate:  [88] 88  (01/31 0731) Resp:  [18] 18  (01/31 0731) BP: (160)/(98) 160/98 mmHg (01/31 0731) SpO2:  [96 %] 96 % (01/31 0731)  General Appearance: Alert, cooperative, no distress, appears stated age Head: Normocephalic, without obvious abnormality, atraumatic Eyes: PERRL, conjunctiva/corneas clear, EOM's intact, fundi benign, both eyes      Ears: Normal TM's and external ear canals, both ears Throat: Lips, mucosa, and tongue normal; teeth and gums normal Neck: Supple, symmetrical, trachea midline, no adenopathy; thyroid: No enlargement/tenderness/nodules; no carotid bruit or JVD Back: Symmetric, no curvature, ROM normal, no CVA tenderness Lungs: Clear to auscultation bilaterally, respirations unlabored Heart: Regular rate and rhythm, S1 and S2 normal, no murmur, rub or gallop Abdomen: Soft, non-tender, bowel sounds active all four quadrants, no masses, no organomegaly Extremities: Extremities normal, atraumatic, no cyanosis or edema Pulses: 2+ and symmetric  all extremities Skin: Skin color, texture, turgor normal, no rashes or lesions  NEUROLOGIC:   Mental status: Alert and oriented x4,  no aphasia, good attention span, fund of knowledge, and memory Motor Exam - grossly normal Sensory Exam - grossly normal Reflexes: 1+ Coordination - grossly normal Gait - grossly normal Balance - grossly normal Cranial Nerves: I: smell Not tested  II: visual acuity  OS: nl    OD: nl  II: visual fields Full to confrontation  II: pupils Equal, round, reactive to  light  III,VII: ptosis None  III,IV,VI: extraocular muscles  Full ROM  V: mastication Normal  V: facial light touch sensation  Normal  V,VII: corneal reflex  Present  VII: facial muscle function - upper  Normal  VII: facial muscle function - lower Normal  VIII: hearing Not tested  IX: soft palate elevation  Normal  IX,X: gag reflex Present  XI: trapezius strength  5/5  XI: sternocleidomastoid strength 5/5  XI: neck flexion strength  5/5  XII: tongue strength  Normal    Data Review Lab Results  Component Value Date   WBC 10.6* 01/18/2013   HGB 16.0* 01/18/2013   HCT 47.2* 01/18/2013   MCV 78.4 01/18/2013   PLT 215 01/18/2013   Lab Results  Component Value Date   NA 141 01/18/2013   K 3.9 01/18/2013   CL 107 01/18/2013   CO2 22 01/18/2013   BUN 10 01/18/2013   CREATININE 0.72 01/18/2013   GLUCOSE 98 01/18/2013   Lab Results  Component Value Date   INR 0.99 01/18/2013    Assessment/Plan: Patient admitted for PLIF L3-4. Patient has failed conservative therapy.  I explained the condition and procedure to the patient and answered any questions.  Patient wishes to proceed with procedure as planned. Understands risks/ benefits and typical outcomes of procedure.   JONES,DAVID S 01/26/2013 9:51 AM

## 2013-01-26 NOTE — Anesthesia Procedure Notes (Signed)
Procedure Name: Intubation Date/Time: 01/26/2013 10:05 AM Performed by: Gwenyth Allegra Pre-anesthesia Checklist: Patient identified, Timeout performed, Emergency Drugs available, Suction available and Patient being monitored Patient Re-evaluated:Patient Re-evaluated prior to inductionPreoxygenation: Pre-oxygenation with 100% oxygen Intubation Type: IV induction Ventilation: Mask ventilation without difficulty and Oral airway inserted - appropriate to patient size Laryngoscope Size: Mac and 3 Grade View: Grade I Tube type: Oral Tube size: 7.0 mm Number of attempts: 1 Airway Equipment and Method: Stylet Placement Confirmation: ETT inserted through vocal cords under direct vision and breath sounds checked- equal and bilateral Secured at: 21 cm Tube secured with: Tape Dental Injury: Teeth and Oropharynx as per pre-operative assessment

## 2013-01-26 NOTE — Progress Notes (Signed)
Patient ID: Meghan Lynn, female   DOB: 1951-08-26, 62 y.o.   MRN: [id number] Pt doing well post-op. Very little pain. No leg pain. Mobilize.

## 2013-01-26 NOTE — Preoperative (Signed)
Beta Blockers   Reason not to administer Beta Blockers:Not Applicable 

## 2013-01-26 NOTE — Transfer of Care (Signed)
Immediate Anesthesia Transfer of Care Note  Patient: Meghan Lynn  Procedure(s) Performed: Procedure(s) (LRB) with comments: MAXIMUM ACCESS (MAS) POSTERIOR LUMBAR INTERBODY FUSION (PLIF) 1 LEVEL (N/A) - Lumbar three-four maximum access surgery posterior lumbar interbody fusion  Patient Location: PACU  Anesthesia Type:General  Level of Consciousness: sedated  Airway & Oxygen Therapy: Patient Spontanous Breathing and Patient connected to nasal cannula oxygen  Post-op Assessment: Report given to PACU RN and Post -op Vital signs reviewed and stable  Post vital signs: Reviewed and stable  Complications: No apparent anesthesia complications

## 2013-01-26 NOTE — Op Note (Signed)
01/26/2013  12:56 PM  PATIENT:  Meghan Lynn  62 y.o. female  PRE-OPERATIVE DIAGNOSIS:  Spondylolisthesis L34 with spinal stenosis, back and leg pain  POST-OPERATIVE DIAGNOSIS:  Same  PROCEDURE:   1. Decompressive lumbar laminectomy L3-4 to decompress both the L3 and the L4 nerve roots which were involved in the stenosis requiring more work than would be required of the typical PLIF procedure in order to adequately decompress the neural elements.  2. Posterior lumbar interbody fusion L3-4 using  PEEK interbody cages packed with morcellized allograft and autograft 3. Posterior fixation L3-4 using Nuvasive cortical pedicle screws    SURGEON:  Marikay Alar, MD  ASSISTANTS: Or Elsner  ANESTHESIA:  General  EBL: 200 ml  Total I/O In: 1500 [I.V.:1500] Out: 605 [Urine:405; Blood:200]  BLOOD ADMINISTERED:none  DRAINS: Hemovac   INDICATION FOR PROCEDURE: This patient presented with a long history of back and leg pain. He had an MRI and plain films with flexion extension views that showed spinal stenosis and a mobile listhesis at L3-4. She tried medical management without relief. I recommended a decompression and fusion at L3-4. Patient understood the risks, benefits, and alternatives and potential outcomes and wished to proceed.  PROCEDURE DETAILS:  The patient was brought to the operating room. After induction of generalized endotracheal anesthesia the patient was rolled into the prone position on chest rolls and all pressure points were padded. The patient's lumbar region was cleaned and then prepped with DuraPrep and draped in the usual sterile fashion. Anesthesia was injected and then a dorsal midline incision was made and carried down to the lumbosacral fascia. The fascia was opened and the paraspinous musculature was taken down in a subperiosteal fashion to expose L3-4. Intraoperative fluoroscopy confirmed my level, and I started with placement of the L3 pedicle screws. I localized  the pedicle screw entry zone utilizing surface landmarks and AP and lateral fluoroscopy. I used a drill and the tap to drill an upper and outward direction to place my cortical pedicle screws. EMG monitoring was used through the entirety of the case. 50 by 30 mm pedicle screws were placed at L3 bilaterally. I turned my attention to the decompression and the spinous process was removed and complete lumbar laminectomies, hemi- facetectomies, and foraminotomies were performed at L3-4. I felt she was symptomatic from both L3 and the L4 nerve roots and therefore both needed to be decompressed. The yellow ligament was removed to expose the underlying dura and nerve roots, and generous foraminotomies were performed to adequately decompress the neural elements. Once the decompression was complete, I turned my attention to the posterior lower lumbar interbody fusion. The epidural venous vasculature was coagulated and cut sharply. Disc space was incised and the initial discectomy was performed with pituitary rongeurs. The disc space was distracted with sequential distractors to a height of 9 mm. We then used a series of scrapers and shavers to prepare the endplates for fusion. The midline was prepared with Epstein curettes. Once the complete discectomy was finished, we packed an appropriate sized peek interbody cage with local autograft and morcellized allograft, gently retracted the nerve root, and tapped the cage into position at L3-4 bilaterally. The midline was packed with morselized autograft and allograft. We then turned our attention to the posterior fixation at L4. The pedicle screw entry zones were identified utilizing surface landmarks and fluoroscopy. We probed each pedicle with the pedicle probe and tapped each pedicle with the appropriate tap. We palpated with a ball probe to assure  no break in the cortex. We then placed 50 by 30 mm pedicle screws into the pedicles bilaterally at L4.  We then placed lordotic rods  into the multiaxial screw heads of the pedicle screws and locked these in position with the locking caps and anti-torque device. We achieved compression of our grafts. We then checked our construct with AP and lateral fluoroscopy. Irrigated with copious amounts of bacitracin-containing saline solution. Placed a medium Hemovac drain through separate stab incision. Inspected the nerve roots once again to assure adequate decompression, lined to the dura with Gelfoam, and closed the muscle and the fascia with 0 Vicryl. Closed the subcutaneous tissues with 2-0 Vicryl and subcuticular tissues with 3-0 Vicryl. The skin was closed with benzoin and Steri-Strips. Dressing was then applied, the patient was awakened from general anesthesia and transported to the recovery room in stable condition. At the end of the procedure all sponge, needle and instrument counts were correct.   PLAN OF CARE: Admit to inpatient   PATIENT DISPOSITION:  PACU - hemodynamically stable.   Delay start of Pharmacological VTE agent (>24hrs) due to surgical blood loss or risk of bleeding:  yes

## 2013-01-27 DIAGNOSIS — M4316 Spondylolisthesis, lumbar region: Secondary | ICD-10-CM | POA: Diagnosis present

## 2013-01-27 MED ORDER — METHOCARBAMOL 500 MG PO TABS: 500.0000 mg | ORAL_TABLET | Freq: Four times a day (QID) | ORAL | Status: DC | PRN

## 2013-01-27 MED ORDER — OXYCODONE-ACETAMINOPHEN 5-325 MG PO TABS: 1.0000 | ORAL_TABLET | ORAL | Status: DC | PRN

## 2013-01-27 NOTE — Progress Notes (Signed)
Pt refused to have foley removed tonight. Pt request foley be removed in the AM. RN to reassess in the AM. Julien Nordmann [facility]

## 2013-01-27 NOTE — Evaluation (Signed)
Physical Therapy Evaluation Patient Details Name: Meghan Lynn MRN: [id number] DOB: 15-Apr-1951 Today's Date: 01/27/2013 Time: 9562-1308 PT Time Calculation (min): 13 min  PT Assessment / Plan / Recommendation Clinical Impression  Pt. was admitted with spondylolesthesis and back and leg pain.  she underwent PLIF at L3-4 and presents to PT with some mild back pain but mobilizing well and expecting DC home later today.  She has no overt LOB and was instructed in back precautions and log rolling technique.  She appears ready for Dc from PT standpoint and does not require f/u PT at home.  She is cautioned to follow instructions from surgeon on increasing her activity level over time.      PT Assessment  Patent does not need any further PT services    Follow Up Recommendations  No PT follow up    Does the patient have the potential to tolerate intense rehabilitation      Barriers to Discharge        Equipment Recommendations  None recommended by PT    Recommendations for Other Services     Frequency      Precautions / Restrictions Precautions Precautions: Back Precaution Booklet Issued: Yes (comment) Required Braces or Orthoses: Spinal Brace Spinal Brace: Lumbar corset;Applied in sitting position Restrictions Weight Bearing Restrictions: No   Pertinent Vitals/Pain Pain 1-2 in low back.  Pt. reports she does not need pain med.  Able to position herself comfortably in recliner chair.      Mobility  Bed Mobility Bed Mobility: Not assessed (pt. presented seated in recliner ) Transfers Transfers: Sit to Stand;Stand to Sit Sit to Stand: 6: Modified independent (Device/Increase time);Without upper extremity assist Stand to Sit: 6: Modified independent (Device/Increase time) Details for Transfer Assistance: manages well with appropriate hand placement Ambulation/Gait Ambulation/Gait Assistance: 7: Independent Ambulation Distance (Feet): 200 Feet Assistive device:  None Ambulation/Gait Assistance Details: pt. ambulating freely around unit without device, withour overt LOB and with no observable difficulty.  Pt. needeed occasional reminder to observe the "no twisting" component of the BAT back precautions. Gait Pattern: Within Functional Limits Gait velocity: normal Stairs: Yes Stairs Assistance: 6: Modified independent (Device/Increase time) Stair Management Technique: One rail Right;Forwards Number of Stairs: 3  Wheelchair Mobility Wheelchair Mobility: No    Shoulder Instructions     Exercises     PT Diagnosis:    PT Problem List:   PT Treatment Interventions:     PT Goals    Visit Information  Last PT Received On: 01/27/13 Assistance Needed: +1    Subjective Data  Subjective: "OH I was up walking early this morning" Patient Stated Goal: to be painfree and to take care of her home   Prior Functioning  Home Living Lives With: Alone Available Help at Discharge: Available 24 hours/day;Other (Comment) (family initially) Type of Home: House Home Access: Stairs to enter Secretary/administrator of Steps: 3 Entrance Stairs-Rails: Right Home Layout: Multi-level;Bed/bath upstairs Alternate Level Stairs-Number of Steps: 7 Alternate Level Stairs-Rails: Can reach both Bathroom Shower/Tub: Walk-in shower Home Adaptive Equipment: Bedside commode/3-in-1;Walker - rolling;Quad cane Prior Function Level of Independence: Independent Able to Take Stairs?: Yes Driving: Yes Vocation: Retired Musician: No difficulties Dominant Hand: Right    Cognition  Overall Cognitive Status: Appears within functional limits for tasks assessed/performed Arousal/Alertness: Awake/alert Orientation Level: Appears intact for tasks assessed Behavior During Session: [facility] for tasks performed    Extremity/Trunk Assessment Right Upper Extremity Assessment RUE ROM/Strength/Tone: Deficits (see OTeval) RUE ROM/Strength/Tone Deficits: Pt with  history  of elbow fracture.  Slight limitations in elbow extension and supination but able to use functionally for selfcare tasks. Left Upper Extremity Assessment LUE ROM/Strength/Tone: Within functional levels Right Lower Extremity Assessment RLE ROM/Strength/Tone: WFL for tasks assessed Left Lower Extremity Assessment LLE ROM/Strength/Tone: WFL for tasks assessed Trunk Assessment Trunk Assessment: Normal   Balance Balance Balance Assessed: Yes Dynamic Standing Balance Dynamic Standing - Balance Support: No upper extremity supported Dynamic Standing - Level of Assistance: 7: Independent  End of Session PT - End of Session Equipment Utilized During Treatment: Gait belt Activity Tolerance: Patient tolerated treatment well Patient left: in chair;with call bell/phone within reach;with family/visitor present Nurse Communication: Mobility status  GP     Ferman Hamming 01/27/2013, 1:23 PM Weldon Picking PT Acute Rehab Services [phone] Beeper [phone]

## 2013-01-27 NOTE — Progress Notes (Signed)
Foley d/c'd at 0520 without difficulty. Meghan Lynn [facility]

## 2013-01-27 NOTE — Evaluation (Signed)
Occupational Therapy Evaluation Patient Details Name: Meghan Lynn MRN: [id number] DOB: 1951/05/30 Today's Date: 01/27/2013 Time: 4098-1191 OT Time Calculation (min): 27 min  OT Assessment / Plan / Recommendation Clinical Impression  Pleasant 62 yr old female admitted for Lumbar fusion L3-L4.  Overall presents with some back pain but is able to perform simulated selfcare tasks and functional transfers at a modified indepedent to independent level.  Already has a 3:1 and shower seat and reacher.  No further OT needs at this time.    OT Assessment  Patient does not need any further OT services    Follow Up Recommendations  No OT follow up       Equipment Recommendations  None recommended by OT          Precautions / Restrictions Precautions Precautions: Back Required Braces or Orthoses: Spinal Brace Spinal Brace: Lumbar corset;Applied in sitting position Restrictions Weight Bearing Restrictions: No   Pertinent Vitals/Pain Pain 5-6/10, pt repositioned but did not want any meds at this time.    ADL  Eating/Feeding: Performed;Independent Where Assessed - Eating/Feeding: Chair Grooming: Simulated;Independent Where Assessed - Grooming: Unsupported standing Upper Body Bathing: Simulated;Modified independent Where Assessed - Upper Body Bathing: Unsupported sitting Lower Body Bathing: Simulated;Modified independent Where Assessed - Lower Body Bathing: Unsupported sit to stand Upper Body Dressing: Performed;Modified independent;Other (comment) (for donnning lumbar corset and robe) Where Assessed - Upper Body Dressing: Unsupported sitting Lower Body Dressing: Simulated;Modified independent Where Assessed - Lower Body Dressing: Unsupported sit to stand Toilet Transfer: Performed;Independent Toilet Transfer Method: Other (comment) (ambulate to elevated toilet) Toilet Transfer Equipment: Comfort height toilet Toileting - Clothing Manipulation and Hygiene:  Simulated;Independent Where Assessed - Toileting Clothing Manipulation and Hygiene: Sit to stand from 3-in-1 or toilet Tub/Shower Transfer: Simulated;Modified independent Tub/Shower Transfer Method: Ambulating Equipment Used: Back brace Transfers/Ambulation Related to ADLs: Pt moving slower than normal secondary to pain and back precautions but overall independent. ADL Comments: Pt reports having a reacher, 3:1, and shower seat already from her husband, who passed away.  Overall modified indepedent level.  Has been educated on back precautions and handout provided.  No further OT needs.  Children will help her with grocery shopping and hevy duty home management tasks.      Visit Information  Last OT Received On: 01/27/13 Assistance Needed: +1    Subjective Data  Subjective: I usuallly don't get pain medicine until I get to a ten. Patient Stated Goal: To get out of the hospital soon.   Prior Functioning     Home Living Lives With: Alone Type of Home: House Home Access: Stairs to enter Entrance Stairs-Number of Steps: 3 Entrance Stairs-Rails: Right Home Layout: Multi-level;Bed/bath upstairs Alternate Level Stairs-Number of Steps: 7 Alternate Level Stairs-Rails: Can reach both Bathroom Shower/Tub: Walk-in shower Home Adaptive Equipment: Bedside commode/3-in-1;Walker - rolling;Quad cane Prior Function Level of Independence: Independent Able to Take Stairs?: Yes Driving: Yes Vocation: Retired Musician: No difficulties Dominant Hand: Right         Vision/Perception Vision - Assessment Eye Alignment: Within Functional Limits Vision Assessment: Vision not tested Perception Perception: Within Functional Limits Praxis Praxis: Intact   Cognition  Overall Cognitive Status: Appears within functional limits for tasks assessed/performed Arousal/Alertness: Awake/alert Orientation Level: Appears intact for tasks assessed Behavior During Session: [facility] for  tasks performed    Extremity/Trunk Assessment Right Upper Extremity Assessment RUE ROM/Strength/Tone: Deficits RUE ROM/Strength/Tone Deficits: Pt with history of elbow fracture.  Slight limitations in elbow extension and supination but able to use  functionally for selfcare tasks. RUE Sensation: WFL - Light Touch RUE Coordination: WFL - gross/fine motor Left Upper Extremity Assessment LUE ROM/Strength/Tone: Within functional levels LUE Sensation: WFL - Light Touch LUE Coordination: WFL - gross/fine motor Trunk Assessment Trunk Assessment: Normal     Mobility Bed Mobility Bed Mobility: Rolling Left;Left Sidelying to Sit;Sit to Sidelying Left Rolling Left: 6: Modified independent (Device/Increase time) Left Sidelying to Sit: 6: Modified independent (Device/Increase time) Sit to Sidelying Left: 6: Modified independent (Device/Increase time) Transfers Transfers: Sit to Stand;Stand to Sit Sit to Stand: 6: Modified independent (Device/Increase time);Without upper extremity assist Stand to Sit: 6: Modified independent (Device/Increase time)           Balance Balance Balance Assessed: Yes Dynamic Standing Balance Dynamic Standing - Balance Support: No upper extremity supported Dynamic Standing - Level of Assistance: 7: Independent   End of Session OT - End of Session Equipment Utilized During Treatment: Gait belt Activity Tolerance: Patient limited by pain Patient left: in chair;with call bell/phone within reach Nurse Communication: Mobility status     Sakira Dahmer OTR/L 01/27/2013, 9:07 AM

## 2013-01-27 NOTE — Discharge Summary (Signed)
Physician Discharge Summary  Patient ID: Meghan Lynn MRN: [id number] DOB/AGE: 1951/10/06 62 y.o.  Admit date: 01/26/2013 Discharge date: 01/27/2013  Admission Diagnoses: Spondylolisthesis L3-L4  Discharge Diagnoses: Spondylolisthesis L3-L4, spinal stenosis  Principal Problem:  *Spondylolisthesis of l3-4   Discharged Condition: good  Hospital Course: A shunt was admitted to undergo surgical decompression arthrodesis at L3-L4 she to postoperative pain was controlled with some additional IV Decadron while in hospital.lerated her surgery well.  Consults: None  Significant Diagnostic Studies: None  Treatments: decompression L3-L4 posterior interbody arthrodesis  nonsegmental fixation L3-L4.   Discharge Exam: Blood pressure 154/81, pulse 66, temperature 98.1 F (36.7 C), temperature source Oral, resp. rate 18, SpO2 100.00%. Incision is clean and dry motor function is intact in lower extremity  Disposition: 01-Home or Self Care  Discharge Orders    Future Orders Please Complete By Expires   Diet - low sodium heart healthy      Increase activity slowly      Call MD for:  redness, tenderness, or signs of infection (pain, swelling, redness, odor or green/yellow discharge around incision site)      Call MD for:  severe uncontrolled pain      Call MD for:  temperature >100.4          Medication List     As of 01/27/2013 11:59 AM    TAKE these medications         aspirin EC 81 MG tablet   Take 81 mg by mouth every morning.      atorvastatin 20 MG tablet   Commonly known as: LIPITOR   Take 20 mg by mouth at bedtime.      GOODY HEADACHE PO   Take 1 packet by mouth daily as needed. For pain      methocarbamol 500 MG tablet   Commonly known as: ROBAXIN   Take 1 tablet (500 mg total) by mouth every 6 (six) hours as needed (Muscle spasm).      metoprolol succinate 25 MG 24 hr tablet   Commonly known as: TOPROL-XL   Take 25 mg by mouth at bedtime.      multivitamin with  minerals Tabs   Take 1 tablet by mouth daily.      niacin 500 MG CR tablet   Commonly known as: NIASPAN   Take 500 mg by mouth at bedtime.      oxyCODONE-acetaminophen 5-325 MG per tablet   Commonly known as: PERCOCET/ROXICET   Take 1-2 tablets by mouth every 4 (four) hours as needed for pain.      RABEprazole 20 MG tablet   Commonly known as: ACIPHEX   Take 20 mg by mouth every morning.      valsartan 320 MG tablet   Commonly known as: DIOVAN   Take 320 mg by mouth at bedtime.         SignedStefani Dama 01/27/2013, 11:59 AM

## 2013-01-27 NOTE — Progress Notes (Signed)
Patient discharged changed to 01/28/13, per Dr. Yetta Barre.

## 2013-01-27 NOTE — Progress Notes (Signed)
Pt. Wanting [place] off unit, to "short stay" and then with further conversation, was planning on walking out to the street to smoke, explained that she could not leave the unit, and especially could not go for smoke.  Pt refused nicotine patch.

## 2013-01-28 NOTE — Progress Notes (Signed)
Pt. Given discharge instructions and Rx., dressed and ready for discharge, has been up walking in halls.

## 2013-05-30 ENCOUNTER — Encounter (HOSPITAL_COMMUNITY): Payer: Self-pay | Admitting: Gastroenterology

## 2013-05-30 ENCOUNTER — Encounter (HOSPITAL_COMMUNITY)
Admission: RE | Disposition: A | Discharge status: Home / Self Care | Payer: Self-pay | Source: Ambulatory Visit | Attending: Gastroenterology

## 2013-05-30 ENCOUNTER — Ambulatory Visit (HOSPITAL_COMMUNITY)
Admission: RE | Admit: 2013-05-30 | Discharge: 2013-05-30 | Disposition: A | Discharge status: Home / Self Care | Payer: 59 | Source: Ambulatory Visit | Attending: Gastroenterology | Admitting: Gastroenterology

## 2013-05-30 ENCOUNTER — Other Ambulatory Visit: Payer: Self-pay | Admitting: Gastroenterology

## 2013-05-30 DIAGNOSIS — K219 Gastro-esophageal reflux disease without esophagitis: Secondary | ICD-10-CM | POA: Insufficient documentation

## 2013-05-30 DIAGNOSIS — F3289 Other specified depressive episodes: Secondary | ICD-10-CM | POA: Insufficient documentation

## 2013-05-30 DIAGNOSIS — Q393 Congenital stenosis and stricture of esophagus: Secondary | ICD-10-CM | POA: Insufficient documentation

## 2013-05-30 DIAGNOSIS — T182XXA Foreign body in stomach, initial encounter: Secondary | ICD-10-CM | POA: Insufficient documentation

## 2013-05-30 DIAGNOSIS — Z885 Allergy status to narcotic agent status: Secondary | ICD-10-CM | POA: Insufficient documentation

## 2013-05-30 DIAGNOSIS — Q391 Atresia of esophagus with tracheo-esophageal fistula: Secondary | ICD-10-CM | POA: Insufficient documentation

## 2013-05-30 DIAGNOSIS — F329 Major depressive disorder, single episode, unspecified: Secondary | ICD-10-CM | POA: Insufficient documentation

## 2013-05-30 DIAGNOSIS — Z7982 Long term (current) use of aspirin: Secondary | ICD-10-CM | POA: Insufficient documentation

## 2013-05-30 DIAGNOSIS — IMO0002 Reserved for concepts with insufficient information to code with codable children: Secondary | ICD-10-CM | POA: Insufficient documentation

## 2013-05-30 DIAGNOSIS — I2510 Atherosclerotic heart disease of native coronary artery without angina pectoris: Secondary | ICD-10-CM | POA: Insufficient documentation

## 2013-05-30 DIAGNOSIS — Z79899 Other long term (current) drug therapy: Secondary | ICD-10-CM | POA: Insufficient documentation

## 2013-05-30 DIAGNOSIS — J40 Bronchitis, not specified as acute or chronic: Secondary | ICD-10-CM | POA: Insufficient documentation

## 2013-05-30 DIAGNOSIS — K311 Adult hypertrophic pyloric stenosis: Secondary | ICD-10-CM | POA: Insufficient documentation

## 2013-05-30 DIAGNOSIS — M4316 Spondylolisthesis, lumbar region: Secondary | ICD-10-CM

## 2013-05-30 DIAGNOSIS — J45909 Unspecified asthma, uncomplicated: Secondary | ICD-10-CM | POA: Insufficient documentation

## 2013-05-30 DIAGNOSIS — Z9861 Coronary angioplasty status: Secondary | ICD-10-CM | POA: Insufficient documentation

## 2013-05-30 DIAGNOSIS — I10 Essential (primary) hypertension: Secondary | ICD-10-CM | POA: Insufficient documentation

## 2013-05-30 DIAGNOSIS — F17200 Nicotine dependence, unspecified, uncomplicated: Secondary | ICD-10-CM | POA: Insufficient documentation

## 2013-05-30 HISTORY — DX: Gastritis, unspecified, without bleeding: K29.70

## 2013-05-30 HISTORY — DX: Unspecified asthma, uncomplicated: J45.909

## 2013-05-30 HISTORY — PX: ESOPHAGOGASTRODUODENOSCOPY: SHX5428

## 2013-05-30 HISTORY — DX: Peptic ulcer, site unspecified, unspecified as acute or chronic, without hemorrhage or perforation: K27.9

## 2013-05-30 HISTORY — PX: BALLOON DILATION: SHX5330

## 2013-05-30 SURGERY — EGD (ESOPHAGOGASTRODUODENOSCOPY)
Anesthesia: Moderate Sedation

## 2013-05-30 MED ORDER — MIDAZOLAM HCL 10 MG/2ML IJ SOLN
INTRAMUSCULAR | Status: DC | PRN
  Administered 2013-05-30: 2 mg via INTRAVENOUS
  Administered 2013-05-30: 1 mg via INTRAVENOUS
  Administered 2013-05-30 (×2): 2 mg via INTRAVENOUS

## 2013-05-30 MED ORDER — FENTANYL CITRATE 0.05 MG/ML IJ SOLN
INTRAMUSCULAR | Status: AC
  Filled 2013-05-30: qty 2

## 2013-05-30 MED ORDER — BUTAMBEN-TETRACAINE-BENZOCAINE 2-2-14 % EX AERO
INHALATION_SPRAY | CUTANEOUS | Status: DC | PRN
  Administered 2013-05-30: 2 via TOPICAL

## 2013-05-30 MED ORDER — SODIUM CHLORIDE 0.9 % IV SOLN
INTRAVENOUS | Status: DC
  Administered 2013-05-30: 500 mL via INTRAVENOUS

## 2013-05-30 MED ORDER — MIDAZOLAM HCL 5 MG/ML IJ SOLN
INTRAMUSCULAR | Status: AC
  Filled 2013-05-30: qty 2

## 2013-05-30 MED ORDER — FENTANYL CITRATE 0.05 MG/ML IJ SOLN
INTRAMUSCULAR | Status: DC | PRN
  Administered 2013-05-30: 25 ug via INTRAVENOUS
  Administered 2013-05-30: 15 ug via INTRAVENOUS
  Administered 2013-05-30: 25 ug via INTRAVENOUS

## 2013-05-30 MED ORDER — SODIUM CHLORIDE 0.9 % IV SOLN: INTRAVENOUS | Status: DC

## 2013-05-30 NOTE — Op Note (Signed)
[facility] [address] [place], [address]   ENDOSCOPY PROCEDURE REPORT  PATIENT: Meghan Lynn, Meghan Lynn  MR#: [id number] BIRTHDATE: 06/14/1951 , 61  yrs. old GENDER: Female ENDOSCOPIST:Jsoeph Podesta Madilyn Fireman, MD REFERRED BY: PROCEDURE DATE:  05/30/2013 PROCEDURE: ASA CLASS: INDICATIONS:   nausea vomiting and suspected pyloric stenosis MEDICATION:    fentanyl 75 mcg, Versed 6 mg TOPICAL ANESTHETIC:    Cetacaine spray  DESCRIPTION OF PROCEDURE:   the Pentax endoscope was advanced in to the esophagus and stomach. There is a small lower esophageal ring it was widely patent. Within the stomach there was a large amount of bezoar material. The lesser curvature was followed until the antrum was visible. There was a slitlike swollen pylorus through which the lumen was barely visible. A 5.5 cm 6-8 mm in diameter balloon catheter was inserted freely into the duodenal bulb and inflated to 6 millimeters for 45 seconds and then to 8 mm for 45 seconds. It was then withdrawn. There was a clear view of the lumen into the duodenum which was somewhat bloody and friable. The scope would not pass. I elected not to use a larger dilating balloon. I did use a 3.8 mm scope and the super sucker 2 lavage and remove some of the bezoar in the proximal stomach. This was partly successful but after while the patient was not tolerating and retching some requiring suction and I elected to terminate the procedure. there were no gross abnormalities of the stomach seen. There was a patent esophageal ring as mentioned but otherwise the esophagus appeared normal.    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 May need additional  dilatation.    _______________________________ Rosalie DoctorDorena Cookey, MD 05/30/2013 2:31 PM    PATIENT NAME:  [name], [name] MR#: [id number]

## 2013-05-30 NOTE — Addendum Note (Signed)
Addended by: Debera Sterba on: 05/30/2013 02:15 PM   Modules accepted: Orders  

## 2013-06-01 ENCOUNTER — Encounter (HOSPITAL_COMMUNITY): Payer: Self-pay | Admitting: Gastroenterology

## 2013-06-08 ENCOUNTER — Other Ambulatory Visit: Payer: Self-pay | Admitting: Internal Medicine

## 2013-06-11 ENCOUNTER — Other Ambulatory Visit: Payer: Self-pay | Admitting: *Deleted

## 2013-06-11 ENCOUNTER — Telehealth: Payer: Self-pay | Admitting: Internal Medicine

## 2013-06-11 MED ORDER — ATORVASTATIN CALCIUM 20 MG PO TABS: 20.0000 mg | ORAL_TABLET | Freq: Every day | ORAL | Status: DC

## 2013-06-11 MED ORDER — RABEPRAZOLE SODIUM 20 MG PO TBEC: 20.0000 mg | DELAYED_RELEASE_TABLET | Freq: Every day | ORAL | Status: DC

## 2013-06-11 MED ORDER — NIACIN ER (ANTIHYPERLIPIDEMIC) 500 MG PO TBCR: 500.0000 mg | EXTENDED_RELEASE_TABLET | Freq: Every day | ORAL | Status: DC

## 2013-06-11 NOTE — Telephone Encounter (Signed)
Forwarded to B. Lassiter, CMA.  

## 2013-06-11 NOTE — Telephone Encounter (Signed)
Meghan Lynn is calling to get clarification on the Aciphex 20mg  Please call

## 2013-06-27 NOTE — H&P (Signed)
  [facility] Admission History & Physical  Chief Complaint: Dysphagia HPI: Meghan Lynn is an 62 y.o. black female.  Who presents with a history of pyloric stenosis and worsening nausea and vomiting for repeat EGD with pyloric balloon dilatation  Past Medical History  Diagnosis Date  . Coronary artery disease   . Hypertension   . Arthritis   . Hyperlipidemia   . Active smoker   . Bronchitis due to tobacco use   . GERD (gastroesophageal reflux disease)   . Headache(784.0)   . Headache, migraine     daily  . Asthma   . Depression   . Peptic ulcer disease   . Gastritis     Past Surgical History  Procedure Laterality Date  . Orif elbow fracture  2006  . Coronary angioplasty  2004    stents x 3  . Fracture surgery    . Cardiovascular stress test  2010  . Esophagogastroduodenoscopy N/A 05/30/2013    Procedure: ESOPHAGOGASTRODUODENOSCOPY (EGD);  Surgeon: Barrie Folk, MD;  Location: [facility] ENDOSCOPY;  Service: Endoscopy;  Laterality: N/A;  . Balloon dilation N/A 05/30/2013    Procedure: BALLOON DILATION;  Surgeon: Barrie Folk, MD;  Location: [facility] ENDOSCOPY;  Service: Endoscopy;  Laterality: N/A;    No prescriptions prior to admission    Allergies: No Known Allergies  History reviewed. No pertinent family history.  Social History:  reports that she has been smoking Cigarettes.  She has a 45 pack-year smoking history. She has never used smokeless tobacco. She reports that she does not drink alcohol or use illicit drugs.  Review of Systems: negative except as above   Blood pressure 137/80, pulse 96, temperature 97.8 F (36.6 C), temperature source Oral, resp. rate 24, height 5\' 1"  (1.549 m), weight 56.7 kg (125 lb), SpO2 97.00%. Head: Normocephalic, without obvious abnormality, atraumatic Neck: no adenopathy, no carotid bruit, no JVD, supple, symmetrical, trachea midline and thyroid not enlarged, symmetric, no tenderness/mass/nodules Resp: clear to auscultation  bilaterally Cardio: regular rate and rhythm, S1, S2 normal, no murmur, click, rub or gallop GI: Abdomen soft Extremities: extremities normal, atraumatic, no cyanosis or edema  No results found for this or any previous visit (from the past 48 hour(s)). No results found.  Assessment: Nausea vomiting with history of pyloric stenosis Plan: Proceed with EGD with balloon dilatation  Merdis Snodgrass C 06/27/2013, 12:02 PM

## 2013-07-06 ENCOUNTER — Other Ambulatory Visit (HOSPITAL_COMMUNITY): Payer: Self-pay | Admitting: Obstetrics

## 2013-07-06 DIAGNOSIS — Z1231 Encounter for screening mammogram for malignant neoplasm of breast: Secondary | ICD-10-CM

## 2013-07-09 ENCOUNTER — Ambulatory Visit (HOSPITAL_COMMUNITY)
Admission: RE | Admit: 2013-07-09 | Discharge: 2013-07-09 | Disposition: A | Discharge status: Home / Self Care | Payer: 59 | Source: Ambulatory Visit | Attending: Obstetrics | Admitting: Obstetrics

## 2013-07-09 DIAGNOSIS — Z1231 Encounter for screening mammogram for malignant neoplasm of breast: Secondary | ICD-10-CM | POA: Insufficient documentation

## 2013-07-12 ENCOUNTER — Other Ambulatory Visit: Payer: Self-pay | Admitting: Gastroenterology

## 2013-07-12 NOTE — Addendum Note (Signed)
Addended by: Dorena Cookey on: 07/12/2013 10:35 AM   Modules accepted: Orders

## 2013-07-26 ENCOUNTER — Encounter (HOSPITAL_COMMUNITY): Admission: RE | Payer: Self-pay | Source: Ambulatory Visit

## 2013-07-26 ENCOUNTER — Ambulatory Visit (HOSPITAL_COMMUNITY): Admission: RE | Admit: 2013-07-26 | Payer: 59 | Source: Ambulatory Visit | Admitting: Gastroenterology

## 2013-07-26 SURGERY — EGD (ESOPHAGOGASTRODUODENOSCOPY)
Anesthesia: Moderate Sedation

## 2013-08-02 ENCOUNTER — Encounter (HOSPITAL_COMMUNITY): Payer: Self-pay | Admitting: *Deleted

## 2013-08-02 ENCOUNTER — Encounter (HOSPITAL_COMMUNITY)
Admission: RE | Disposition: A | Discharge status: Home / Self Care | Payer: Self-pay | Source: Ambulatory Visit | Attending: Gastroenterology

## 2013-08-02 ENCOUNTER — Ambulatory Visit (HOSPITAL_COMMUNITY)
Admission: RE | Admit: 2013-08-02 | Discharge: 2013-08-02 | Disposition: A | Discharge status: Home / Self Care | Payer: 59 | Source: Ambulatory Visit | Attending: Gastroenterology | Admitting: Gastroenterology

## 2013-08-02 DIAGNOSIS — F329 Major depressive disorder, single episode, unspecified: Secondary | ICD-10-CM | POA: Insufficient documentation

## 2013-08-02 DIAGNOSIS — Z7982 Long term (current) use of aspirin: Secondary | ICD-10-CM | POA: Insufficient documentation

## 2013-08-02 DIAGNOSIS — I2510 Atherosclerotic heart disease of native coronary artery without angina pectoris: Secondary | ICD-10-CM | POA: Insufficient documentation

## 2013-08-02 DIAGNOSIS — K219 Gastro-esophageal reflux disease without esophagitis: Secondary | ICD-10-CM | POA: Insufficient documentation

## 2013-08-02 DIAGNOSIS — M129 Arthropathy, unspecified: Secondary | ICD-10-CM | POA: Insufficient documentation

## 2013-08-02 DIAGNOSIS — M4316 Spondylolisthesis, lumbar region: Secondary | ICD-10-CM

## 2013-08-02 DIAGNOSIS — K2960 Other gastritis without bleeding: Secondary | ICD-10-CM | POA: Insufficient documentation

## 2013-08-02 DIAGNOSIS — E785 Hyperlipidemia, unspecified: Secondary | ICD-10-CM | POA: Insufficient documentation

## 2013-08-02 DIAGNOSIS — I10 Essential (primary) hypertension: Secondary | ICD-10-CM | POA: Insufficient documentation

## 2013-08-02 DIAGNOSIS — K311 Adult hypertrophic pyloric stenosis: Secondary | ICD-10-CM | POA: Insufficient documentation

## 2013-08-02 DIAGNOSIS — J45909 Unspecified asthma, uncomplicated: Secondary | ICD-10-CM | POA: Insufficient documentation

## 2013-08-02 DIAGNOSIS — J40 Bronchitis, not specified as acute or chronic: Secondary | ICD-10-CM | POA: Insufficient documentation

## 2013-08-02 DIAGNOSIS — F3289 Other specified depressive episodes: Secondary | ICD-10-CM | POA: Insufficient documentation

## 2013-08-02 DIAGNOSIS — F17200 Nicotine dependence, unspecified, uncomplicated: Secondary | ICD-10-CM | POA: Insufficient documentation

## 2013-08-02 DIAGNOSIS — Z79899 Other long term (current) drug therapy: Secondary | ICD-10-CM | POA: Insufficient documentation

## 2013-08-02 DIAGNOSIS — R6881 Early satiety: Secondary | ICD-10-CM | POA: Insufficient documentation

## 2013-08-02 DIAGNOSIS — Z8711 Personal history of peptic ulcer disease: Secondary | ICD-10-CM | POA: Insufficient documentation

## 2013-08-02 HISTORY — PX: ESOPHAGOGASTRODUODENOSCOPY: SHX5428

## 2013-08-02 HISTORY — PX: BALLOON DILATION: SHX5330

## 2013-08-02 SURGERY — EGD (ESOPHAGOGASTRODUODENOSCOPY)
Anesthesia: Moderate Sedation

## 2013-08-02 MED ORDER — DIPHENHYDRAMINE HCL 50 MG/ML IJ SOLN
INTRAMUSCULAR | Status: AC
  Filled 2013-08-02: qty 1

## 2013-08-02 MED ORDER — SODIUM CHLORIDE 0.9 % IV SOLN
INTRAVENOUS | Status: DC
  Administered 2013-08-02: 500 mL via INTRAVENOUS

## 2013-08-02 MED ORDER — FENTANYL CITRATE 0.05 MG/ML IJ SOLN
INTRAMUSCULAR | Status: AC
  Filled 2013-08-02: qty 4

## 2013-08-02 MED ORDER — MIDAZOLAM HCL 10 MG/2ML IJ SOLN
INTRAMUSCULAR | Status: DC | PRN
  Administered 2013-08-02: 2 mg via INTRAVENOUS
  Administered 2013-08-02: 1 mg via INTRAVENOUS
  Administered 2013-08-02: 2 mg via INTRAVENOUS
  Administered 2013-08-02: 1 mg via INTRAVENOUS

## 2013-08-02 MED ORDER — DIPHENHYDRAMINE HCL 50 MG/ML IJ SOLN
INTRAMUSCULAR | Status: DC | PRN
  Administered 2013-08-02: 25 mg via INTRAVENOUS

## 2013-08-02 MED ORDER — FENTANYL CITRATE 0.05 MG/ML IJ SOLN
INTRAMUSCULAR | Status: DC | PRN
  Administered 2013-08-02 (×3): 25 ug via INTRAVENOUS

## 2013-08-02 MED ORDER — BUTAMBEN-TETRACAINE-BENZOCAINE 2-2-14 % EX AERO
INHALATION_SPRAY | CUTANEOUS | Status: DC | PRN
  Administered 2013-08-02: 2 via TOPICAL

## 2013-08-02 MED ORDER — MIDAZOLAM HCL 5 MG/ML IJ SOLN
INTRAMUSCULAR | Status: AC
  Filled 2013-08-02: qty 2

## 2013-08-02 NOTE — H&P (Signed)
[facility] Admission History & Physical  Chief Complaint: Nausea vomiting early satiety HPI: Meghan Lynn is an 62 y.o. black female.  Who presents to mildly actively for a lower dilatation for established pyloric stenosis. She underwent pyloric balloon dilatation with partial relief of symptoms a few months ago but has had recurrent intermittent nausea vomiting and early satiety. She presents for repeat dilatation.  Past Medical History  Diagnosis Date  . Coronary artery disease   . Hypertension   . Arthritis   . Hyperlipidemia   . Active smoker   . Bronchitis due to tobacco use   . GERD (gastroesophageal reflux disease)   . Headache(784.0)   . Headache, migraine     daily  . Asthma   . Depression   . Peptic ulcer disease   . Gastritis     Past Surgical History  Procedure Laterality Date  . Orif elbow fracture  2006  . Coronary angioplasty  2004    stents x 3  . Fracture surgery    . Cardiovascular stress test  2010  . Esophagogastroduodenoscopy N/A 05/30/2013    Procedure: ESOPHAGOGASTRODUODENOSCOPY (EGD);  Surgeon: Barrie Folk, MD;  Location: [facility] ENDOSCOPY;  Service: Endoscopy;  Laterality: N/A;  . Balloon dilation N/A 05/30/2013    Procedure: BALLOON DILATION;  Surgeon: Barrie Folk, MD;  Location: [facility] ENDOSCOPY;  Service: Endoscopy;  Laterality: N/A;    Medications Prior to Admission  Medication Sig Dispense Refill  . Aspirin-Acetaminophen-Caffeine (GOODY HEADACHE PO) Take 1 packet by mouth daily as needed. For pain      . atorvastatin (LIPITOR) 20 MG tablet Take 1 tablet (20 mg total) by mouth daily.  30 tablet  9  . DIOVAN 320 MG tablet take 1 tablet by mouth once daily (NEED APPT FOR MORE REFILLS)  30 tablet  9  . methocarbamol (ROBAXIN) 500 MG tablet Take 1 tablet (500 mg total) by mouth every 6 (six) hours as needed (Muscle spasm).  60 tablet  0  . metoprolol succinate (TOPROL-XL) 25 MG 24 hr tablet take 1 tablet by mouth once daily (NEED APPT FOR  MORE REFILLS)  30 tablet  9  . Multiple Vitamin (MULTIVITAMIN WITH MINERALS) TABS Take 1 tablet by mouth daily.      . niacin (NIASPAN) 500 MG CR tablet Take 1 tablet (500 mg total) by mouth at bedtime.  30 tablet  9  . oxyCODONE-acetaminophen (PERCOCET/ROXICET) 5-325 MG per tablet Take 1-2 tablets by mouth every 4 (four) hours as needed for pain.  60 tablet  0  . RABEprazole (ACIPHEX) 20 MG tablet Take 1 tablet (20 mg total) by mouth daily.  60 tablet  9  . aspirin EC 81 MG tablet Take 81 mg by mouth every morning.         Allergies: No Known Allergies  History reviewed. No pertinent family history.  Social History:  reports that she has been smoking Cigarettes.  She has a 45 pack-year smoking history. She has never used smokeless tobacco. She reports that she does not drink alcohol or use illicit drugs.  Review of Systems: negative except as above   Blood pressure 145/101, pulse 75, temperature 98.9 F (37.2 C), temperature source Oral, resp. rate 20, height 5\' 1"  (1.549 m), weight 56.7 kg (125 lb), SpO2 97.00%. Head: Normocephalic, without obvious abnormality, atraumatic Neck: no adenopathy, no carotid bruit, no JVD, supple, symmetrical, trachea midline and thyroid not enlarged, symmetric, no tenderness/mass/nodules Resp: clear to auscultation bilaterally Cardio: regular  rate and rhythm, S1, S2 normal, no murmur, click, rub or gallop GI: Abdomen soft nondistended nontender Extremities: extremities normal, atraumatic, no cyanosis or edema  No results found for this or any previous visit (from the past 48 hour(s)). No results found.  Assessment: Pyloric stenosis with intermittent obstructing symptoms spotting to previous pyloric balloon dilatation to Plan: Receive with EGD with pyloric balloon dilatation.  Esiquio Boesen C 08/02/2013, 1:18 PM

## 2013-08-02 NOTE — Op Note (Signed)
[facility] [address] [place], [address]   ENDOSCOPY PROCEDURE REPORT  PATIENT: Shonteria, Abeln  MR#: [id number] BIRTHDATE: December 17, 1951 , 62  yrs. old GENDER: Female ENDOSCOPIST:Daira Hine Madilyn Fireman, MD REFERRED BY: PROCEDURE DATE:  08/02/2013 PROCEDURE: ASA CLASS: INDICATIONS:  symptomatic pyloric stenosis MEDICATION:   fentanyl 75 mcg Versed 6 mg, fentanyl 25 mcg TOPICAL ANESTHETIC:    Cetacaine  DESCRIPTION OF PROCEDURE:  esophagus: Normal Stomach: Normal proximally without bezoar. Slitlike deformity of pylorus. Would not admit tip of the scope. Wire-guided balloon catheter 5.5 cm in length was passed over a guidewire into the pylorus and dilated to 6 mm for 45 seconds and then 8 mm for 45 more seconds. The catheter was then exchanged for an 8-10 mm and dilated an 8 mm for 45 seconds and then 10 mm for 45 seconds. The pylorus was friable and looked more open but still would not admit passage of the endoscope tip. Thus the duodenum was not entered. The scope was then withdrawn     COMPLICATIONS: None  ENDOSCOPIC IMPRESSION:pyloric stenosis status post balloon dilatation.  RECOMMENDATIONS:gradually advance to dysphagia 3 diet and followup in the office for success of improving gastric emptying.    _______________________________ eSignedDorena Cookey, MD 08/02/2013 1:45 PM

## 2013-08-03 ENCOUNTER — Encounter (HOSPITAL_COMMUNITY): Payer: Self-pay | Admitting: Gastroenterology

## 2013-11-09 ENCOUNTER — Ambulatory Visit (INDEPENDENT_AMBULATORY_CARE_PROVIDER_SITE_OTHER): Payer: 59 | Admitting: Internal Medicine

## 2013-11-09 VITALS — BP 122/76 | HR 77 | Temp 98.9°F | Resp 17 | Ht 60.5 in | Wt 128.0 lb

## 2013-11-09 DIAGNOSIS — S01502A Unspecified open wound of oral cavity, initial encounter: Secondary | ICD-10-CM

## 2013-11-09 DIAGNOSIS — J029 Acute pharyngitis, unspecified: Secondary | ICD-10-CM

## 2013-11-09 DIAGNOSIS — K146 Glossodynia: Secondary | ICD-10-CM

## 2013-11-09 MED ORDER — AMOXICILLIN 500 MG PO CAPS: 1000.0000 mg | ORAL_CAPSULE | Freq: Two times a day (BID) | ORAL | Status: DC

## 2013-11-09 NOTE — Progress Notes (Signed)
  Subjective:    Patient ID: Meghan Lynn, female    DOB: 04-Apr-1951, 62 y.o.   MRN: [id number]  HPI    Review of Systems     Objective:   Physical Exam        Assessment & Plan:

## 2013-11-09 NOTE — Progress Notes (Signed)
  Subjective:    Patient ID: Meghan Lynn, female    DOB: [birth year], 62 y.o.   MRN: [id number]  HPI 62 year old female presents with   1. Right ear pain: earache x 3 weeks off and on.  swollen glands in neck x 11/04/13 on right side.  Complains of low grade fever only at night.  Sore throat.   2. Tongue sore: Bit tip of tongue she believes during the night 11/04/13.  Sore and swollen when she awoke. Worsened since and patient now complains of a significant lump in the tip of her tongue.  Worsened despite Lynn water swishes.   smoker Review of Systems     Objective:   Physical Exam  Constitutional: She is oriented to person, place, and time. She appears well-developed and well-nourished.  HENT:  Head: Normocephalic.  Right Ear: External ear normal.  Left Ear: External ear normal.  Nose: Nose normal.  Mouth/Throat: Oral lesions present. No uvula swelling. Posterior oropharyngeal edema and posterior oropharyngeal erythema present.    Tongue wound with swelling  Eyes: EOM are normal.  Neck: Normal range of motion.  Cardiovascular: Normal rate.   Pulmonary/Chest: Effort normal.  Lymphadenopathy:    She has cervical adenopathy.  Neurological: She is alert and oriented to person, place, and time. She exhibits normal muscle tone. Coordination normal.  Psychiatric: She has a normal mood and affect.     Results for orders placed in visit on 11/09/13  POCT RAPID STREP A (OFFICE)      Result Value Range   Rapid Strep A Screen Negative  Negative       Assessment & Plan:  Quit smoking Tongue wound/contusion Pharyngitis

## 2013-11-09 NOTE — Patient Instructions (Signed)

## 2013-12-11 ENCOUNTER — Ambulatory Visit (INDEPENDENT_AMBULATORY_CARE_PROVIDER_SITE_OTHER): Payer: 59 | Admitting: Family Medicine

## 2013-12-11 VITALS — BP 132/84 | HR 79 | Temp 98.4°F | Resp 17 | Ht 60.0 in | Wt 131.0 lb

## 2013-12-11 DIAGNOSIS — R509 Fever, unspecified: Secondary | ICD-10-CM

## 2013-12-11 DIAGNOSIS — R059 Cough, unspecified: Secondary | ICD-10-CM

## 2013-12-11 DIAGNOSIS — J209 Acute bronchitis, unspecified: Secondary | ICD-10-CM

## 2013-12-11 DIAGNOSIS — R05 Cough: Secondary | ICD-10-CM

## 2013-12-11 MED ORDER — AZITHROMYCIN 250 MG PO TABS: ORAL_TABLET | ORAL | Status: DC

## 2013-12-11 MED ORDER — HYDROCODONE-HOMATROPINE 5-1.5 MG/5ML PO SYRP: 5.0000 mL | ORAL_SOLUTION | Freq: Three times a day (TID) | ORAL | Status: DC | PRN

## 2013-12-11 NOTE — Progress Notes (Signed)
Patient ID: Meghan Lynn MRN: [id number], DOB: 1951-09-05, 62 y.o. Date of Encounter: 12/11/2013, 9:05 AM  Primary Physician: No PCP Per Patient  Chief Complaint:  Chief Complaint  Patient presents with  . Cough  . URI    HPI: 62 y.o. year old female presents with a 14 day history of nasal congestion, post nasal drip, sore throat, and cough. Mild sinus pressure. Afebrile. No chills. Nasal congestion thick and green/yellow. Cough is productive of green/yellow sputum and not associated with time of day. Ears feel full, leading to sensation of muffled hearing. Has tried OTC cold preps without success. No GI complaints.  Retired from [employer].  Still smoking  No sick contacts, recent antibiotics, or recent travels.   No leg trauma, sedentary periods, h/o cancer, or tobacco use.  Past Medical History  Diagnosis Date  . Coronary artery disease   . Hypertension   . Arthritis   . Hyperlipidemia   . Active smoker   . Bronchitis due to tobacco use   . GERD (gastroesophageal reflux disease)   . Headache(784.0)   . Headache, migraine     daily  . Asthma   . Depression   . Peptic ulcer disease   . Gastritis      Home Meds: Prior to Admission medications   Medication Sig Start Date End Date Taking? Authorizing Provider  atorvastatin (LIPITOR) 20 MG tablet Take 1 tablet (20 mg total) by mouth daily. 06/11/13  Yes Chrystie Nose, MD  DIOVAN 320 MG tablet take 1 tablet by mouth once daily (NEED APPT FOR MORE REFILLS) 06/08/13  Yes Chrystie Nose, MD  methocarbamol (ROBAXIN) 500 MG tablet Take 1 tablet (500 mg total) by mouth every 6 (six) hours as needed (Muscle spasm). 01/27/13  Yes Barnett Abu, MD  metoprolol succinate (TOPROL-XL) 25 MG 24 hr tablet take 1 tablet by mouth once daily (NEED APPT FOR MORE REFILLS) 06/08/13  Yes Chrystie Nose, MD  Multiple Vitamin (MULTIVITAMIN WITH MINERALS) TABS Take 1 tablet by mouth daily.   Yes Historical Provider, MD  niacin  (NIASPAN) 500 MG CR tablet Take 1 tablet (500 mg total) by mouth at bedtime. 06/11/13  Yes Chrystie Nose, MD  RABEprazole (ACIPHEX) 20 MG tablet Take 1 tablet (20 mg total) by mouth daily. 06/11/13  Yes Chrystie Nose, MD  azithromycin (ZITHROMAX Z-PAK) 250 MG tablet Take as directed on pack 12/11/13   Elvina Sidle, MD  HYDROcodone-homatropine [facility]) 5-1.5 MG/5ML syrup Take 5 mLs by mouth every 8 (eight) hours as needed for cough. 12/11/13   Elvina Sidle, MD    Allergies: No Known Allergies  History   Social History  . Marital Status: Widowed    Spouse Name: N/A    Number of Children: N/A  . Years of Education: N/A   Occupational History  . Not on file.   Social History Main Topics  . Smoking status: Current Every Day Smoker -- 1.00 packs/day for 45 years    Types: Cigarettes  . Smokeless tobacco: Never Used  . Alcohol Use: No  . Drug Use: No  . Sexual Activity: Yes    Birth Control/ Protection: Abstinence   Other Topics Concern  . Not on file   Social History Narrative  . No narrative on file     Review of Systems: Constitutional: negative for chills, fever, night sweats or weight changes Cardiovascular: negative for chest pain or palpitations Respiratory: negative for hemoptysis, wheezing, or shortness of breath  Abdominal: negative for abdominal pain, nausea, vomiting or diarrhea Dermatological: negative for rash Neurologic: negative for headache   Physical Exam: Blood pressure 132/84, pulse 79, temperature 98.4 F (36.9 C), temperature source Oral, resp. rate 17, height 5' (1.524 m), weight 131 lb (59.421 kg), SpO2 96.00%., Body mass index is 25.58 kg/(m^2). General: Well developed, well nourished, in no acute distress. Head: Normocephalic, atraumatic, eyes without discharge, sclera non-icteric, nares are congested. Bilateral auditory canals clear, TM's are without perforation, pearly grey with reflective cone of light bilaterally. No sinus TTP. Oral  cavity moist, dentition normal. Posterior pharynx with post nasal drip and mild erythema. No peritonsillar abscess or tonsillar exudate. Neck: Supple. No thyromegaly. Full ROM. No lymphadenopathy. Lungs: Coarse breath sounds bilaterally without wheezes, rales, or rhonchi. Breathing is unlabored.  Heart: RRR with S1 S2. No murmurs, rubs, or gallops appreciated. Msk:  Strength and tone normal for age. Extremities: No clubbing or cyanosis. No edema. Neuro: Alert and oriented X 3. Moves all extremities spontaneously. CNII-XII grossly in tact. Psych:  Responds to questions appropriately with a normal affect.    ASSESSMENT AND PLAN:  62 y.o. year old female with bronchitis. -Acute bronchitis - Plan: HYDROcodone-homatropine (HYCODAN) 5-1.5 MG/5ML syrup, azithromycin (ZITHROMAX Z-PAK) 250 MG tablet   -Mucinex -Tylenol/Motrin prn -Rest/fluids -RTC precautions -RTC 3-5 days if no improvement  Signed, Elvina Sidle, MD 12/11/2013 9:05 AM

## 2013-12-11 NOTE — Patient Instructions (Signed)

## 2014-01-22 ENCOUNTER — Ambulatory Visit (INDEPENDENT_AMBULATORY_CARE_PROVIDER_SITE_OTHER): Payer: 59 | Admitting: Family Medicine

## 2014-01-22 ENCOUNTER — Ambulatory Visit: Payer: 59

## 2014-01-22 VITALS — BP 132/82 | HR 83 | Temp 98.3°F | Resp 17 | Ht 60.5 in | Wt 135.0 lb

## 2014-01-22 DIAGNOSIS — F17200 Nicotine dependence, unspecified, uncomplicated: Secondary | ICD-10-CM

## 2014-01-22 DIAGNOSIS — R05 Cough: Secondary | ICD-10-CM

## 2014-01-22 DIAGNOSIS — J069 Acute upper respiratory infection, unspecified: Secondary | ICD-10-CM

## 2014-01-22 DIAGNOSIS — R059 Cough, unspecified: Secondary | ICD-10-CM

## 2014-01-22 DIAGNOSIS — J209 Acute bronchitis, unspecified: Secondary | ICD-10-CM

## 2014-01-22 LAB — POCT CBC
Granulocyte percent: 69.1 %G (ref 37–80)
HCT, POC: 50.5 % — AB (ref 37.7–47.9)
Hemoglobin: 16 g/dL (ref 12.2–16.2)
Lymph, poc: 2.8 (ref 0.6–3.4)
MCH: 26.8 pg — AB (ref 27–31.2)
MCHC: 31.7 g/dL — AB (ref 31.8–35.4)
MCV: 84.7 fL (ref 80–97)
MID (CBC): 0.9 (ref 0–0.9)
MPV: 8.3 fL (ref 0–99.8)
PLATELET COUNT, POC: 207 10*3/uL (ref 142–424)
POC Granulocyte: 8.3 — AB (ref 2–6.9)
POC LYMPH %: 23.6 % (ref 10–50)
POC MID %: 7.3 % (ref 0–12)
RBC: 5.96 M/uL — AB (ref 4.04–5.48)
RDW, POC: 15 %
WBC: 12 10*3/uL — AB (ref 4.6–10.2)

## 2014-01-22 MED ORDER — CEFDINIR 300 MG PO CAPS: 300.0000 mg | ORAL_CAPSULE | Freq: Two times a day (BID) | ORAL | Status: DC

## 2014-01-22 MED ORDER — HYDROCODONE-HOMATROPINE 5-1.5 MG/5ML PO SYRP: 5.0000 mL | ORAL_SOLUTION | ORAL | Status: DC | PRN

## 2014-01-22 MED ORDER — ALBUTEROL SULFATE HFA 108 (90 BASE) MCG/ACT IN AERS: 2.0000 | INHALATION_SPRAY | RESPIRATORY_TRACT | Status: DC | PRN

## 2014-01-22 NOTE — Patient Instructions (Addendum)
Drink plenty of fluids and get enough rest  Use medications as directed  Return if worse  Take a target date for quitting smoking. Plan the date for a month or 2 ahead of time. Make yourself taper back until you are  smoking about 5 or 6 cigarettes by the quit date. On the quit date for the cigarettes away and even though you're miserable stop smoking.  Do not say you are going to try to quit smoking, decide that you are going to quit smoking. You need to make no excuses for picking up another cigarette.

## 2014-01-22 NOTE — Progress Notes (Signed)
Subjective: 63 year old lady who has a history of having a respiratory tract infection back in December for which she was treated with a Zithromax pack. She says that she usually responds well with complete clearing when treated in this fashion, but this time it acted little differently. She is continued on with a lingering cough. Now she is getting worse. She is coughing up more yellow phlegm with occasional speck of blood in it. She coughed pretty hard at times. She does still smoke. She has some upper respiratory infection and her ears hurts him also.  Objective Pleasant alert lady in no major distress. She is holding a Kleenex her hand and having her nose from time to time. Her TMs appear normal. Throat is clear. Neck supple without significant nodes. Chest has coarse respirations in the right upper lobe anteriorly. Heart regular without murmurs.  Assessment: Recurrent URI and bronchitis Tobacco use  Plan: Chest x-ray and CBC and decide treatment accordingly  Results for orders placed in visit on 01/22/14  POCT CBC      Result Value Range   WBC 12.0 (*) 4.6 - 10.2 K/uL   Lymph, poc 2.8  0.6 - 3.4   POC LYMPH PERCENT 23.6  10 - 50 %L   MID (cbc) 0.9  0 - 0.9   POC MID % 7.3  0 - 12 %M   POC Granulocyte 8.3 (*) 2 - 6.9   Granulocyte percent 69.1  37 - 80 %G   RBC 5.96 (*) 4.04 - 5.48 M/uL   Hemoglobin 16.0  12.2 - 16.2 g/dL   HCT, POC 50.5 (*) 37.7 - 47.9 %   MCV 84.7  80 - 97 fL   MCH, POC 26.8 (*) 27 - 31.2 pg   MCHC 31.7 (*) 31.8 - 35.4 g/dL   RDW, POC 15.0     Platelet Count, POC 207  142 - 424 K/uL   MPV 8.3  0 - 99.8 fL   [facility] reading (PRIMARY) by  Dr. Linna Darner Normal cxr  Acute bronchitis.  Treat with antibiotics. [name] Kitchen

## 2014-04-13 ENCOUNTER — Other Ambulatory Visit: Payer: Self-pay | Admitting: Internal Medicine

## 2014-04-15 NOTE — Telephone Encounter (Signed)
Rx was sent to pharmacy electronically. last OV 03/2013

## 2014-05-03 ENCOUNTER — Other Ambulatory Visit: Payer: Self-pay | Admitting: Internal Medicine

## 2014-05-13 NOTE — Telephone Encounter (Signed)
Rx was sent to pharmacy electronically. Last OV 03/2013 

## 2014-07-11 ENCOUNTER — Other Ambulatory Visit: Payer: Self-pay | Admitting: Internal Medicine

## 2014-07-11 NOTE — Telephone Encounter (Signed)
Rx was sent to pharmacy electronically. Patient notified she needs appmt.  

## 2014-07-18 ENCOUNTER — Telehealth: Payer: Self-pay | Admitting: Internal Medicine

## 2014-07-18 ENCOUNTER — Other Ambulatory Visit: Payer: Self-pay | Admitting: Internal Medicine

## 2014-07-19 NOTE — Telephone Encounter (Signed)
Closed encounter °

## 2014-07-30 ENCOUNTER — Other Ambulatory Visit (HOSPITAL_COMMUNITY): Payer: Self-pay | Admitting: Obstetrics

## 2014-07-30 DIAGNOSIS — Z1231 Encounter for screening mammogram for malignant neoplasm of breast: Secondary | ICD-10-CM

## 2014-08-20 ENCOUNTER — Ambulatory Visit (HOSPITAL_COMMUNITY)
Admission: RE | Admit: 2014-08-20 | Discharge: 2014-08-20 | Disposition: A | Discharge status: Home / Self Care | Payer: 59 | Source: Ambulatory Visit | Attending: Obstetrics | Admitting: Obstetrics

## 2014-08-20 DIAGNOSIS — Z1231 Encounter for screening mammogram for malignant neoplasm of breast: Secondary | ICD-10-CM | POA: Insufficient documentation

## 2014-08-26 LAB — CYTOLOGY - PAP: PAP SMEAR: NEGATIVE

## 2014-08-28 ENCOUNTER — Encounter: Payer: Self-pay | Admitting: *Deleted

## 2014-08-29 ENCOUNTER — Ambulatory Visit (INDEPENDENT_AMBULATORY_CARE_PROVIDER_SITE_OTHER): Payer: 59 | Admitting: Internal Medicine

## 2014-08-29 ENCOUNTER — Encounter: Payer: Self-pay | Admitting: Internal Medicine

## 2014-08-29 VITALS — BP 146/82 | HR 73 | Ht 61.0 in | Wt 137.0 lb

## 2014-08-29 DIAGNOSIS — Z79899 Other long term (current) drug therapy: Secondary | ICD-10-CM

## 2014-08-29 DIAGNOSIS — F17200 Nicotine dependence, unspecified, uncomplicated: Secondary | ICD-10-CM | POA: Insufficient documentation

## 2014-08-29 DIAGNOSIS — I2510 Atherosclerotic heart disease of native coronary artery without angina pectoris: Secondary | ICD-10-CM

## 2014-08-29 DIAGNOSIS — I2583 Coronary atherosclerosis due to lipid rich plaque: Secondary | ICD-10-CM

## 2014-08-29 DIAGNOSIS — I10 Essential (primary) hypertension: Secondary | ICD-10-CM | POA: Insufficient documentation

## 2014-08-29 DIAGNOSIS — E782 Mixed hyperlipidemia: Secondary | ICD-10-CM

## 2014-08-29 DIAGNOSIS — E785 Hyperlipidemia, unspecified: Secondary | ICD-10-CM | POA: Insufficient documentation

## 2014-08-29 DIAGNOSIS — Z720 Tobacco use: Secondary | ICD-10-CM

## 2014-08-29 MED ORDER — ATORVASTATIN CALCIUM 20 MG PO TABS: ORAL_TABLET | ORAL | Status: DC

## 2014-08-29 MED ORDER — RABEPRAZOLE SODIUM 20 MG PO TBEC: 20.0000 mg | DELAYED_RELEASE_TABLET | Freq: Two times a day (BID) | ORAL | Status: DC

## 2014-08-29 MED ORDER — BUPROPION HCL ER (SR) 150 MG PO TB12: 150.0000 mg | ORAL_TABLET | Freq: Two times a day (BID) | ORAL | Status: DC

## 2014-08-29 MED ORDER — METOPROLOL SUCCINATE ER 25 MG PO TB24: ORAL_TABLET | ORAL | Status: DC

## 2014-08-29 MED ORDER — NIACIN ER (ANTIHYPERLIPIDEMIC) 500 MG PO TBCR: EXTENDED_RELEASE_TABLET | ORAL | Status: DC

## 2014-08-29 MED ORDER — VALSARTAN 320 MG PO TABS: ORAL_TABLET | ORAL | Status: DC

## 2014-08-29 NOTE — Progress Notes (Signed)
OFFICE NOTE  Chief Complaint:  Routine follow-up   Primary Care Physician: No PCP Per Patient  HPI:  Meghan Lynn  is a 63 year old female with a history of coronary artery disease status post stenting of the right coronary with 2 Cypher stents and a Taxus stent to the proximal right coronary in 2004. She had a negative stress test in 2007 and otherwise has done pretty well, has been asymptomatic. She has had problems with ongoing tobacco abuse and continues to smoke, however, she says this has been a particularly difficult year. She reported that her husband died this year as well as her sister and brother-in-law. Given these multiple problems she has had a difficult time with grieving and has of course resorted back to smoking. Before she has tried Chantix but has failed that. She is doing some activity, including walking on a treadmill about 3 times a week. Her smoking is less than 1 pack per day, denies any alcohol or street drugs.    She reports since I last saw her that she underwent back surgery and has reported a marked improvement in her back pain. She is continuing to have some difficulty since her husband died and has started smoking again. I have provided her with Wellbutrin however she ran out of that but says that it was very helpful for her.  PMHx:  Past Medical History  Diagnosis Date  . Coronary artery disease     3 stents to RCA  . Hypertension   . Arthritis   . Hyperlipidemia   . Active smoker   . Bronchitis due to tobacco use   . GERD (gastroesophageal reflux disease)   . Headache(784.0)   . Headache, migraine     daily  . Asthma   . Depression   . Peptic ulcer disease   . Gastritis     Past Surgical History  Procedure Laterality Date  . Orif elbow fracture  2006  . Coronary angioplasty  2004    stents x 3  . Fracture surgery    . Cardiovascular stress test  2010  . Esophagogastroduodenoscopy N/A 05/30/2013    Procedure: ESOPHAGOGASTRODUODENOSCOPY  (EGD);  Surgeon: Missy Sabins, MD;  Location: [facility] ENDOSCOPY;  Service: Endoscopy;  Laterality: N/A;  . Balloon dilation N/A 05/30/2013    Procedure: BALLOON DILATION;  Surgeon: Missy Sabins, MD;  Location: [place];  Service: Endoscopy;  Laterality: N/A;  . Esophagogastroduodenoscopy N/A 08/02/2013    Procedure: ESOPHAGOGASTRODUODENOSCOPY (EGD);  Surgeon: Missy Sabins, MD;  Location: [facility] ENDOSCOPY;  Service: Endoscopy;  Laterality: N/A;  barb Greggory [name]  . Balloon dilation N/A 08/02/2013    Procedure: BALLOON DILATION;  Surgeon: Missy Sabins, MD;  Location: [facility] ENDOSCOPY;  Service: Endoscopy;  Laterality: N/A;  . Transthoracic echocardiogram  07/2008    normal LV function, mild MR, mild TR, trace pulm valve regurg  . Cardiolite myocardial perfusion study  05/2006    positive bruce protocol, low risk, EF 80%  . Cardiac catheterization  03/07/2002    moderte CAD 80% (mid RCA) - Dr. Myrtice Lauth  . Cardiac catheterization  09/13/2003    Cypher 2.5x31mm and 2.5x18 and Taxus 2.75x51mm to distal, mid, prox RCA (Dr. Gerrie Nordmann)    FAMHx:  Family History  Problem Relation Age of Onset  . Diabetes Mother   . CAD Mother     CABG  . Bone cancer Father   . Hypertension Father   . Heart disease Brother   .  Kidney disease Sister   . Cancer Sister   . Diabetes Sister   . Hyperlipidemia Sister   . Hypertension Sister     SOCHx:   reports that she has been smoking Cigarettes.  She has a 45 pack-year smoking history. She has never used smokeless tobacco. She reports that she does not drink alcohol or use illicit drugs.  ALLERGIES:  No Known Allergies  ROS: A comprehensive review of systems was negative except for: Behavioral/Psych: positive for tobacco use  HOME MEDS: Current Outpatient Prescriptions  Medication Sig Dispense Refill  . aspirin EC 81 MG tablet Take 81 mg by mouth daily.      [name] Kitchen atorvastatin (LIPITOR) 20 MG tablet take 1 tablet by mouth once daily  30 tablet  11  . metoprolol succinate  (TOPROL-XL) 25 MG 24 hr tablet take 1 tablet by mouth once daily  30 tablet  11  . Multiple Vitamin (MULTIVITAMIN WITH MINERALS) TABS Take 1 tablet by mouth daily.      . niacin (NIASPAN) 500 MG CR tablet take 1 tablet by mouth at bedtime  30 tablet  11  . RABEprazole (ACIPHEX) 20 MG tablet Take 1 tablet (20 mg total) by mouth 2 (two) times daily.  60 tablet  1  . valsartan (DIOVAN) 320 MG tablet take 1 tablet by mouth once daily  30 tablet  11  . buPROPion (WELLBUTRIN SR) 150 MG 12 hr tablet Take 1 tablet (150 mg total) by mouth 2 (two) times daily. Take 1 tablet daily for 3 days then increase to 1 tablet twice daily. Do not abruptly stop this medication.  60 tablet  5   No current facility-administered medications for this visit.    LABS/IMAGING: No results found for this or any previous visit (from the past 48 hour(s)). No results found.  VITALS: BP 146/82  Pulse 73  Ht 5\' 1"  (1.549 m)  Wt 137 lb (62.143 kg)  BMI 25.90 kg/m2  EXAM: General appearance: alert and no distress Neck: no carotid bruit and no JVD Lungs: clear to auscultation bilaterally Heart: regular rate and rhythm, S1, S2 normal, no murmur, click, rub or gallop Abdomen: soft, non-tender; bowel sounds normal; no masses,  no organomegaly Extremities: extremities normal, atraumatic, no cyanosis or edema Pulses: 2+ and symmetric Skin: Skin color, texture, turgor normal. No rashes or lesions Neurologic: Grossly normal Psych: Normal  EKG: Normal sinus rhythm at 73, nonspecific ST and T wave changes  ASSESSMENT: 1. CAD status post PCI x3 to the right coronary artery 2007 2. Hypertension 3. Dyslipidemia 4. Tobacco abuse  PLAN: 1.   Meghan Lynn returns today for followup. She is doing well without angina. Her blood pressure is fairly well controlled. She is due for recheck of her cholesterol. We will obtain screening labs today. In addition she is interested in smoking cessation again and had some mild benefit from  Wellbutrin in the past. She's also struggled with some depression after the death of her husband. I think she would benefit again from Wellbutrin. I've given her prescriptions for that and we will plan to see her back annually or sooner as necessary.  Pixie Casino, MD, [facility] Attending Cardiologist [facility]  Meghan Lynn 08/29/2014, 4:49 PM

## 2014-08-29 NOTE — Patient Instructions (Addendum)
Dr Debara Pickett has ordered you to have blood work done FASTING.  Your physician wants you to follow-up in 1 year. You will receive a reminder letter in the mail one to two months in advance. If you don't receive a letter, please call our office to schedule the follow-up appointment.

## 2014-08-30 NOTE — Addendum Note (Signed)
Addended by: Vear Clock on: 08/30/2014 01:53 PM   Modules accepted: Orders

## 2014-09-04 LAB — LIPID PANEL
CHOL/HDL RATIO: 2.7 ratio
Cholesterol: 121 mg/dL (ref 0–200)
HDL: 45 mg/dL (ref >39)
LDL CALC: 44 mg/dL (ref 0–99)
TRIGLYCERIDES: 159 mg/dL — AB (ref <150)
VLDL: 32 mg/dL (ref 0–40)

## 2014-09-04 LAB — COMPREHENSIVE METABOLIC PANEL
ALBUMIN: 3.9 g/dL (ref 3.5–5.2)
ALK PHOS: 73 U/L (ref 39–117)
ALT: 23 U/L (ref 0–35)
AST: 25 U/L (ref 0–37)
BILIRUBIN TOTAL: 0.4 mg/dL (ref 0.2–1.2)
BUN: 9 mg/dL (ref 6–23)
CO2: 28 mEq/L (ref 19–32)
CREATININE: 0.87 mg/dL (ref 0.50–1.10)
Calcium: 9.6 mg/dL (ref 8.4–10.5)
Chloride: 105 mEq/L (ref 96–112)
GLUCOSE: 84 mg/dL (ref 70–99)
Potassium: 4.2 mEq/L (ref 3.5–5.3)
Sodium: 141 mEq/L (ref 135–145)
Total Protein: 6.8 g/dL (ref 6.0–8.3)

## 2014-09-04 LAB — CBC
HCT: 50.3 % — ABNORMAL HIGH (ref 36.0–46.0)
Hemoglobin: 16.7 g/dL — ABNORMAL HIGH (ref 12.0–15.0)
MCH: 26.6 pg (ref 26.0–34.0)
MCHC: 33.2 g/dL (ref 30.0–36.0)
MCV: 80.2 fL (ref 78.0–100.0)
PLATELETS: 220 10*3/uL (ref 150–400)
RBC: 6.27 MIL/uL — ABNORMAL HIGH (ref 3.87–5.11)
RDW: 15.2 % (ref 11.5–15.5)
WBC: 8.5 10*3/uL (ref 4.0–10.5)

## 2014-09-05 ENCOUNTER — Telehealth: Payer: Self-pay | Admitting: *Deleted

## 2014-09-05 NOTE — Telephone Encounter (Signed)
Message copied by Fidel Levy on Thu Sep 05, 2014  9:13 AM ------      Message from: Pixie Casino      Created: Wed Sep 04, 2014  7:12 PM       Labs are good.  Cholesterol even lower than it needs to be. Could cut lipitor in half to 10 mg daily.            Dr. Debara Pickett ------

## 2014-09-05 NOTE — Telephone Encounter (Signed)
Patient notified of lab results and instructed to decrease lipitor 10mg  once daily. Patient voiced understanding. Med list updated.

## 2014-09-11 ENCOUNTER — Ambulatory Visit (INDEPENDENT_AMBULATORY_CARE_PROVIDER_SITE_OTHER): Payer: 59 | Admitting: Family Medicine

## 2014-09-11 ENCOUNTER — Ambulatory Visit (INDEPENDENT_AMBULATORY_CARE_PROVIDER_SITE_OTHER): Payer: 59

## 2014-09-11 VITALS — BP 132/90 | HR 80 | Temp 98.0°F | Resp 16 | Ht 60.5 in | Wt 136.8 lb

## 2014-09-11 DIAGNOSIS — J209 Acute bronchitis, unspecified: Secondary | ICD-10-CM

## 2014-09-11 DIAGNOSIS — R059 Cough, unspecified: Secondary | ICD-10-CM

## 2014-09-11 DIAGNOSIS — R05 Cough: Secondary | ICD-10-CM

## 2014-09-11 DIAGNOSIS — J208 Acute bronchitis due to other specified organisms: Secondary | ICD-10-CM

## 2014-09-11 MED ORDER — CEFDINIR 300 MG PO CAPS: 300.0000 mg | ORAL_CAPSULE | Freq: Two times a day (BID) | ORAL | Status: DC

## 2014-09-11 MED ORDER — HYDROCOD POLST-CHLORPHEN POLST 10-8 MG/5ML PO LQCR: 5.0000 mL | Freq: Two times a day (BID) | ORAL | Status: DC | PRN

## 2014-09-11 MED ORDER — ALBUTEROL SULFATE HFA 108 (90 BASE) MCG/ACT IN AERS: 2.0000 | INHALATION_SPRAY | Freq: Four times a day (QID) | RESPIRATORY_TRACT | Status: DC | PRN

## 2014-09-11 NOTE — Patient Instructions (Signed)
We are going to treat you for bronchitis/ possible mild pneumonia with omnicef (antibiotic), tussionex cough syrup and albuterol inhaler as needed.  If you are not better in the next 2-3 days please let me know.  The cough syrup will make you sleepy!

## 2014-09-11 NOTE — Progress Notes (Signed)
[facility] [address] [place] [address] [phone]  Date:  09/11/2014   Name:  Meghan Lynn   DOB:  Mar 16, 1951   MRN:  [id number]  PCP:  No PCP Per Patient    Chief Complaint: Cough, Nasal drainage and Otalgia   History of Present Illness:  Meghan Lynn is a 63 y.o. very pleasant female patient who presents with the following:  She is here today with a cough for about one week. She is coughing up some thick, discolored mucus.  She will cough to the point of emesis and is not sleeping well due to cough.  She had noted a "low grade fever" the first few days of this illness, but fever seems to have resolved. She also notes a right ear pain and sore throat.  She has a HA from coughing. She is using OTC cough and cold medications as needed    She did have stents in 2004, usual cardiology care per Dr. Debara Pickett. No other current cardiac issues Patient Active Problem List   Diagnosis Date Noted  . CAD (coronary artery disease) 08/29/2014  . HTN (hypertension) 08/29/2014  . Dyslipidemia 08/29/2014  . Tobacco abuse 08/29/2014  . Spondylolisthesis of l3-4 01/27/2013    Past Medical History  Diagnosis Date  . Coronary artery disease     3 stents to RCA  . Hypertension   . Arthritis   . Hyperlipidemia   . Active smoker   . Bronchitis due to tobacco use   . GERD (gastroesophageal reflux disease)   . Headache(784.0)   . Headache, migraine     daily  . Asthma   . Depression   . Peptic ulcer disease   . Gastritis     Past Surgical History  Procedure Laterality Date  . Orif elbow fracture  2006  . Coronary angioplasty  2004    stents x 3  . Fracture surgery    . Cardiovascular stress test  2010  . Esophagogastroduodenoscopy N/A 05/30/2013    Procedure: ESOPHAGOGASTRODUODENOSCOPY (EGD);  Surgeon: Missy Sabins, MD;  Location[facility] ENDOSCOPY;  Service: Endoscopy;  Laterality: N/A;  . Balloon dilation N/A 05/30/2013    Procedure: BALLOON DILATION;   Surgeon: Missy Sabins, MD;  Location: [place];  Service: Endoscopy;  Laterality: N/A;  . Esophagogastroduodenoscopy N/A 08/02/2013    Procedure: ESOPHAGOGASTRODUODENOSCOPY (EGD);  Surgeon: Missy Sabins, MD;  Location: [facility] ENDOSCOPY;  Service: Endoscopy;  Laterality: N/A;  barb Greggory [name]  . Balloon dilation N/A 08/02/2013    Procedure: BALLOON DILATION;  Surgeon: Missy Sabins, MD;  Location: [facility] ENDOSCOPY;  Service: Endoscopy;  Laterality: N/A;  . Transthoracic echocardiogram  07/2008    normal LV function, mild MR, mild TR, trace pulm valve regurg  . Cardiolite myocardial perfusion study  05/2006    positive bruce protocol, low risk, EF 80%  . Cardiac catheterization  03/07/2002    moderte CAD 80% (mid RCA) - Dr. Myrtice Lauth  . Cardiac catheterization  09/13/2003    Cypher 2.5x15mm and 2.5x18 and Taxus 2.75x41mm to distal, mid, prox RCA (Dr. Gerrie Nordmann)    History  Substance Use Topics  . Smoking status: Current Every Day Smoker -- 1.00 packs/day for 45 years    Types: Cigarettes  . Smokeless tobacco: Never Used  . Alcohol Use: No    Family History  Problem Relation Age of Onset  . Diabetes Mother   . CAD Mother     CABG  .  Bone cancer Father   . Hypertension Father   . Heart disease Brother   . Kidney disease Sister   . Cancer Sister   . Diabetes Sister   . Hyperlipidemia Sister   . Hypertension Sister     No Known Allergies  Medication list has been reviewed and updated.  Current Outpatient Prescriptions on File Prior to Visit  Medication Sig Dispense Refill  . aspirin EC 81 MG tablet Take 81 mg by mouth daily.      [name] Kitchen atorvastatin (LIPITOR) 20 MG tablet Take 10 mg by mouth daily.      [name] Kitchen buPROPion (WELLBUTRIN SR) 150 MG 12 hr tablet Take 1 tablet (150 mg total) by mouth 2 (two) times daily. Take 1 tablet daily for 3 days then increase to 1 tablet twice daily. Do not abruptly stop this medication.  60 tablet  5  . metoprolol succinate (TOPROL-XL) 25 MG 24 hr tablet take 1  tablet by mouth once daily  30 tablet  11  . Multiple Vitamin (MULTIVITAMIN WITH MINERALS) TABS Take 1 tablet by mouth daily.      . niacin (NIASPAN) 500 MG CR tablet take 1 tablet by mouth at bedtime  30 tablet  11  . RABEprazole (ACIPHEX) 20 MG tablet Take 1 tablet (20 mg total) by mouth 2 (two) times daily.  60 tablet  1  . valsartan (DIOVAN) 320 MG tablet take 1 tablet by mouth once daily  30 tablet  11   No current facility-administered medications on file prior to visit.    Review of Systems:  As per HPI- otherwise negative.   Physical Examination: Filed Vitals:   09/11/14 1044  BP: 142/102  Pulse: 80  Temp: 98 F (36.7 C)  Resp: 16   Filed Vitals:   09/11/14 1044  Height: 5' 0.5" (1.537 m)  Weight: 136 lb 12.8 oz (62.052 kg)   Body mass index is 26.27 kg/(m^2). Ideal Body Weight: Weight in (lb) to have BMI = 25: 129.9  GEN: WDWN, NAD, Non-toxic, A & O x 3, slim build, looks well HEENT: Atraumatic, Normocephalic. Neck supple. No masses, No LAD.  Bilateral TM wnl, oropharynx normal.  PEERL,EOMI.   Ears and Nose: No external deformity. CV: RRR, No M/G/R. No JVD. No thrill. No extra heart sounds. PULM: CTA B, no wheezes, crackles, rhonchi. No retractions. No resp. distress. No accessory muscle use. EXTR: No c/c/e NEURO Normal gait.  PSYCH: Normally interactive. Conversant. Not depressed or anxious appearing.  Calm demeanor.   [facility] reading (PRIMARY) by  Dr. Lorelei Pont. CXR;  Compared with past- possible RUL infiltrate vs scarring.    CHEST 2 VIEW  COMPARISON: January 22, 2014  FINDINGS:  There is no edema or consolidation. The heart size and pulmonary  vascularity are normal. No adenopathy. There is mild degenerative  change in the thoracic spine.  IMPRESSION:  No edema or consolidation  Assessment and Plan: Acute bronchitis due to other specified organisms - Plan: cefdinir (OMNICEF) 300 MG capsule, albuterol (PROVENTIL HFA;VENTOLIN HFA) 108 (90 BASE) MCG/ACT  inhaler  Cough - Plan: DG Chest 2 View, chlorpheniramine-HYDROcodone (TUSSIONEX PENNKINETIC ER) 10-8 MG/5ML LQCR  Will treat with omnicef, tussionex and albuterol as needed.  Close follow-up if not better  See patient instructions for more details.      Signed Lamar Blinks, MD

## 2014-11-18 ENCOUNTER — Other Ambulatory Visit: Payer: Self-pay | Admitting: Internal Medicine

## 2014-11-18 NOTE — Telephone Encounter (Signed)
Rx was sent to pharmacy electronically. 

## 2014-12-03 ENCOUNTER — Encounter: Payer: Self-pay | Admitting: Internal Medicine

## 2014-12-23 ENCOUNTER — Encounter: Payer: Self-pay | Admitting: Cardiovascular Disease

## 2015-05-13 ENCOUNTER — Other Ambulatory Visit: Payer: Self-pay

## 2015-05-13 DIAGNOSIS — J208 Acute bronchitis due to other specified organisms: Secondary | ICD-10-CM

## 2015-05-13 NOTE — Telephone Encounter (Signed)
Pharmacy would like to change Rx from ProAir to Ventolin. Please advise. Rx pended.

## 2015-05-14 MED ORDER — ALBUTEROL SULFATE HFA 108 (90 BASE) MCG/ACT IN AERS: 2.0000 | INHALATION_SPRAY | Freq: Four times a day (QID) | RESPIRATORY_TRACT | Status: DC | PRN

## 2015-05-14 NOTE — Telephone Encounter (Signed)
Rx signed, but no refills.  Per chart, she does not have a PCP. Recommend establish for primary care for additional refills.  Meds ordered this encounter  Medications  . albuterol (PROVENTIL HFA;VENTOLIN HFA) 108 (90 BASE) MCG/ACT inhaler    Sig: Inhale 2 puffs into the lungs every 6 (six) hours as needed for wheezing or shortness of breath.    Dispense:  1 Inhaler    Refill:  0

## 2015-08-11 ENCOUNTER — Telehealth: Payer: Self-pay | Admitting: *Deleted

## 2015-08-11 NOTE — Telephone Encounter (Signed)
Faxed PA for rabeprazole 20mg  BID to [employer]

## 2015-08-13 ENCOUNTER — Telehealth: Payer: Self-pay | Admitting: Internal Medicine

## 2015-08-13 NOTE — Telephone Encounter (Addendum)
Rabeprazole 20mg  BID prior authorization denied - not on formulary   Alternatives: Omeprazole 10mg  or 20mg  (no quantity limits); Dexilant 30mg  or 60mg ; Nexium capsules (brand name only)  Routed to MD to advise

## 2015-08-13 NOTE — Telephone Encounter (Signed)
Called and spoke with [employer] rep. They needed more info - did patient ever try another PPI. Per my knowledge, patient has been on rabeprazole since prior to 05/2013. Rep will submit request to clinical review team to make determination.   In the event rabeprazole does not get approved.[name] Kitchen Dexilant, Nexium capsules, omeprazole 10 and 20mg  are on patient's formulary.

## 2015-08-13 NOTE — Telephone Encounter (Signed)
Meghan Lynn called in stating that she needed a bit more information before processing the PA for the pt . She wanted to know if the pt had tried and failed any of the other medications on the formulary and she would also like some clinical rational as to why the doctor is requesting this medication. Please f/u with her .   Fax #- [phone]

## 2015-08-14 ENCOUNTER — Other Ambulatory Visit (HOSPITAL_COMMUNITY): Payer: Self-pay | Admitting: Obstetrics

## 2015-08-14 DIAGNOSIS — Z1231 Encounter for screening mammogram for malignant neoplasm of breast: Secondary | ICD-10-CM

## 2015-08-18 NOTE — Telephone Encounter (Signed)
Would recommend Nexium 40 mg daily (brand name) as an alternative.  Dr. Lemmie Evens

## 2015-08-20 MED ORDER — NEXIUM 40 MG PO CPDR: 40.0000 mg | DELAYED_RELEASE_CAPSULE | Freq: Every day | ORAL | Status: DC

## 2015-08-20 NOTE — Telephone Encounter (Signed)
Patient aware that medication is not covered and agrees to try alternative.  Rx(s) sent to pharmacy electronically.

## 2015-08-20 NOTE — Addendum Note (Signed)
Addended by: Fidel Levy on: 08/20/2015 08:56 AM   Modules accepted: Orders, Medications

## 2015-08-22 ENCOUNTER — Ambulatory Visit (HOSPITAL_COMMUNITY)
Admission: RE | Admit: 2015-08-22 | Discharge: 2015-08-22 | Disposition: A | Discharge status: Home / Self Care | Payer: Medicare Other | Source: Ambulatory Visit | Attending: Obstetrics | Admitting: Obstetrics

## 2015-08-22 DIAGNOSIS — Z1231 Encounter for screening mammogram for malignant neoplasm of breast: Secondary | ICD-10-CM | POA: Diagnosis not present

## 2015-09-04 ENCOUNTER — Other Ambulatory Visit: Payer: Self-pay | Admitting: Internal Medicine

## 2015-09-30 ENCOUNTER — Ambulatory Visit (INDEPENDENT_AMBULATORY_CARE_PROVIDER_SITE_OTHER): Payer: Medicare Other | Admitting: Internal Medicine

## 2015-09-30 ENCOUNTER — Encounter: Payer: Self-pay | Admitting: Internal Medicine

## 2015-09-30 VITALS — BP 148/102 | HR 76 | Ht 61.0 in | Wt 137.6 lb

## 2015-09-30 DIAGNOSIS — I10 Essential (primary) hypertension: Secondary | ICD-10-CM

## 2015-09-30 DIAGNOSIS — I2510 Atherosclerotic heart disease of native coronary artery without angina pectoris: Secondary | ICD-10-CM

## 2015-09-30 DIAGNOSIS — Z833 Family history of diabetes mellitus: Secondary | ICD-10-CM

## 2015-09-30 DIAGNOSIS — Z131 Encounter for screening for diabetes mellitus: Secondary | ICD-10-CM

## 2015-09-30 DIAGNOSIS — R946 Abnormal results of thyroid function studies: Secondary | ICD-10-CM

## 2015-09-30 DIAGNOSIS — R5383 Other fatigue: Secondary | ICD-10-CM | POA: Diagnosis not present

## 2015-09-30 DIAGNOSIS — E785 Hyperlipidemia, unspecified: Secondary | ICD-10-CM | POA: Diagnosis not present

## 2015-09-30 DIAGNOSIS — I2583 Coronary atherosclerosis due to lipid rich plaque: Secondary | ICD-10-CM

## 2015-09-30 DIAGNOSIS — K219 Gastro-esophageal reflux disease without esophagitis: Secondary | ICD-10-CM

## 2015-09-30 DIAGNOSIS — Z79899 Other long term (current) drug therapy: Secondary | ICD-10-CM | POA: Diagnosis not present

## 2015-09-30 DIAGNOSIS — Z720 Tobacco use: Secondary | ICD-10-CM

## 2015-09-30 DIAGNOSIS — R7989 Other specified abnormal findings of blood chemistry: Secondary | ICD-10-CM

## 2015-09-30 LAB — CBC
HEMATOCRIT: 50.2 % — AB (ref 36.0–46.0)
Hemoglobin: 16.7 g/dL — ABNORMAL HIGH (ref 12.0–15.0)
MCH: 26.7 pg (ref 26.0–34.0)
MCHC: 33.3 g/dL (ref 30.0–36.0)
MCV: 80.2 fL (ref 78.0–100.0)
MPV: 9.9 fL (ref 8.6–12.4)
PLATELETS: 205 10*3/uL (ref 150–400)
RBC: 6.26 MIL/uL — ABNORMAL HIGH (ref 3.87–5.11)
RDW: 15.3 % (ref 11.5–15.5)
WBC: 8.4 10*3/uL (ref 4.0–10.5)

## 2015-09-30 LAB — LIPID PANEL
CHOLESTEROL: 142 mg/dL (ref 125–200)
HDL: 56 mg/dL (ref >=46)
LDL Cholesterol: 65 mg/dL (ref <130)
TRIGLYCERIDES: 104 mg/dL (ref <150)
Total CHOL/HDL Ratio: 2.5 Ratio (ref <=5.0)
VLDL: 21 mg/dL (ref <30)

## 2015-09-30 LAB — COMPREHENSIVE METABOLIC PANEL
ALT: 17 U/L (ref 6–29)
AST: 23 U/L (ref 10–35)
Albumin: 4 g/dL (ref 3.6–5.1)
Alkaline Phosphatase: 87 U/L (ref 33–130)
BUN: 8 mg/dL (ref 7–25)
CALCIUM: 9.5 mg/dL (ref 8.6–10.4)
CHLORIDE: 107 mmol/L (ref 98–110)
CO2: 25 mmol/L (ref 20–31)
Creat: 0.88 mg/dL (ref 0.50–0.99)
GLUCOSE: 89 mg/dL (ref 65–99)
POTASSIUM: 4.2 mmol/L (ref 3.5–5.3)
Sodium: 143 mmol/L (ref 135–146)
TOTAL PROTEIN: 7.1 g/dL (ref 6.1–8.1)
Total Bilirubin: 0.4 mg/dL (ref 0.2–1.2)

## 2015-09-30 LAB — TSH: TSH: 0.316 u[IU]/mL — AB (ref 0.350–4.500)

## 2015-09-30 MED ORDER — IRBESARTAN 300 MG PO TABS: 300.0000 mg | ORAL_TABLET | Freq: Every day | ORAL | Status: DC

## 2015-09-30 MED ORDER — BUPROPION HCL ER (SR) 150 MG PO TB12: ORAL_TABLET | ORAL | Status: DC

## 2015-09-30 NOTE — Patient Instructions (Signed)
Your physician recommends that you return for lab work in: FASTING   >> CBC, CMET, lipid, TSH, hemoglobin A1C  Your physician has recommended you make the following change in your medication: CHECK on cost of irbesartan 300mg  (which will replace valsartan)  Your physician has recommended you make the following change in your medication: START wellbutrin as directed  Your physician has recommended you make the following change in your medication: TAKE enteric coated aspirin EVERY DAY - take with aciphex  Your physician wants you to follow-up in: 1 year with Dr. Debara Pickett. You will receive a reminder letter in the mail two months in advance. If you don't receive a letter, please call our office to schedule the follow-up appointment.

## 2015-09-30 NOTE — Progress Notes (Signed)
OFFICE NOTE  Chief Complaint:  Routine follow-up   Primary Care Physician: No PCP Per Patient  HPI:  Meghan Lynn  is a 64 year old female with a history of coronary artery disease status post stenting of the right coronary with 2 Cypher stents and a Taxus stent to the proximal right coronary in 2004. She had a negative stress test in 2007 and otherwise has done pretty well, has been asymptomatic. She has had problems with ongoing tobacco abuse and continues to smoke, however, she says this has been a particularly difficult year. She reported that her husband died this year as well as her sister and brother-in-law. Given these multiple problems she has had a difficult time with grieving and has of course resorted back to smoking. Before she has tried Chantix but has failed that. She is doing some activity, including walking on a treadmill about 3 times a week. Her smoking is less than 1 pack per day, denies any alcohol or street drugs.    She reports since I last saw her that she underwent back surgery and has reported a marked improvement in her back pain. She is continuing to have some difficulty since her husband died and has started smoking again. I have provided her with Wellbutrin however she ran out of that but says that it was very helpful for her.  I saw Meghan Lynn back in the office today. Overall she is doing fairly well. Unfortunate she's had 3 Desyrel with the past year and has battled some depression. She ran out of her Wellbutrin and has started smoking again. She wishes to get back off the cigarettes and restart Wellbutrin. From a cardiac standpoint she denies any chest pain or worsening shortness of breath. She's only taking aspirin once a week due to some stomach upset. She's also concerned about the cost of valsartan and due to being on Medicare.  PMHx:  Past Medical History  Diagnosis Date  . Coronary artery disease     3 stents to RCA  . Hypertension   . Arthritis    . Hyperlipidemia   . Active smoker   . Bronchitis due to tobacco use ([place])   . GERD (gastroesophageal reflux disease)   . Headache(784.0)   . Headache, migraine     daily  . Asthma   . Depression   . Peptic ulcer disease   . Gastritis     Past Surgical History  Procedure Laterality Date  . Orif elbow fracture  2006  . Coronary angioplasty  2004    stents x 3  . Fracture surgery    . Cardiovascular stress test  2010  . Esophagogastroduodenoscopy N/A 05/30/2013    Procedure: ESOPHAGOGASTRODUODENOSCOPY (EGD);  Surgeon: Missy Sabins, MD;  Location[facility] ENDOSCOPY;  Service: Endoscopy;  Laterality: N/A;  . Balloon dilation N/A 05/30/2013    Procedure: BALLOON DILATION;  Surgeon: Missy Sabins, MD;  Location: [place];  Service: Endoscopy;  Laterality: N/A;  . Esophagogastroduodenoscopy N/A 08/02/2013    Procedure: ESOPHAGOGASTRODUODENOSCOPY (EGD);  Surgeon: Missy Sabins, MD;  Location: [facility] ENDOSCOPY;  Service: Endoscopy;  Laterality: N/A;  barb Greggory [name]  . Balloon dilation N/A 08/02/2013    Procedure: BALLOON DILATION;  Surgeon: Missy Sabins, MD;  Location: [facility] ENDOSCOPY;  Service: Endoscopy;  Laterality: N/A;  . Transthoracic echocardiogram  07/2008    normal LV function, mild MR, mild TR, trace pulm valve regurg  . Cardiolite myocardial perfusion study  05/2006    positive  bruce protocol, low risk, EF 80%  . Cardiac catheterization  03/07/2002    moderte CAD 80% (mid RCA) - Dr. Myrtice Lauth  . Cardiac catheterization  09/13/2003    Cypher 2.5x33mm and 2.5x18 and Taxus 2.75x25mm to distal, mid, prox RCA (Dr. Gerrie Nordmann)    FAMHx:  Family History  Problem Relation Age of Onset  . Diabetes Mother   . CAD Mother     CABG  . Bone cancer Father   . Hypertension Father   . Heart disease Brother   . Kidney disease Sister   . Cancer Sister   . Diabetes Sister   . Hyperlipidemia Sister   . Hypertension Sister     SOCHx:   reports that she has been smoking Cigarettes.  She has a 22.5  pack-year smoking history. She has never used smokeless tobacco. She reports that she does not drink alcohol or use illicit drugs.  ALLERGIES:  No Known Allergies  ROS: A comprehensive review of systems was negative except for: Behavioral/Psych: positive for tobacco use  HOME MEDS: Current Outpatient Prescriptions  Medication Sig Dispense Refill  . aspirin EC 81 MG tablet Take 81 mg by mouth once a week.     [name] Kitchen atorvastatin (LIPITOR) 20 MG tablet take 1 tablet by mouth once daily 30 tablet 1  . metoprolol succinate (TOPROL-XL) 25 MG 24 hr tablet take 1 tablet by mouth once daily 30 tablet 1  . Multiple Vitamin (MULTIVITAMIN WITH MINERALS) TABS Take 1 tablet by mouth daily.    . niacin (NIASPAN) 500 MG CR tablet take 1 tablet by mouth at bedtime 30 tablet 1  . RABEprazole (ACIPHEX) 20 MG tablet Take 20 mg by mouth 2 (two) times daily.  0  . buPROPion (WELLBUTRIN SR) 150 MG 12 hr tablet Take 1 tablet daily for 3 days then increase to 1 tablet twice daily. Do not abruptly stop this medication. 60 tablet 5  . irbesartan (AVAPRO) 300 MG tablet Take 1 tablet (300 mg total) by mouth daily. 30 tablet 11   No current facility-administered medications for this visit.    LABS/IMAGING: No results found for this or any previous visit (from the past 48 hour(s)). No results found.  VITALS: BP 148/102 mmHg  Pulse 76  Ht 5\' 1"  (1.549 m)  Wt 137 lb 9.6 oz (62.415 kg)  BMI 26.01 kg/m2  EXAM: General appearance: alert and no distress Neck: no carotid bruit and no JVD Lungs: clear to auscultation bilaterally Heart: regular rate and rhythm, S1, S2 normal, no murmur, click, rub or gallop Abdomen: soft, non-tender; bowel sounds normal; no masses,  no organomegaly Extremities: extremities normal, atraumatic, no cyanosis or edema Pulses: 2+ and symmetric Skin: Skin color, texture, turgor normal. No rashes or lesions Neurologic: Grossly normal Psych: Normal  EKG: Normal sinus rhythm at 76,  nonspecific T wave changes  ASSESSMENT: 1. CAD status post PCI x3 to the right coronary artery 2007 2. Hypertension 3. Dyslipidemia 4. Tobacco abuse 5. GERD  PLAN: 1.   Meghan Lynn returns today for annual follow-up. Overall she seems to be doing fairly well. Fortunately she started smoking again due to significant stressors and ran out of Wellbutrin. She wants to go back on that and we'll go ahead and prescribe that today. Blood pressure is elevated today however she says her takes her blood pressure medications at night. She tells me that her home blood pressures are better controlled. She is due for repeat cholesterol check and laboratory work as  she has no primary care provider. We'll go ahead and check a comp is a metabolic profile, CBC, TSH, A1c and lipid profile. She does report some stomach upset on aspirin but I advised her to take that aspirin in the morning with her morning dose of AcipHex. She may be taking too many pills at once. If this still seems to be a problem, we could consider switching her from aspirin over to Plavix.  Follow-up annually or sooner as necessary.  Pixie Casino, MD, [facility] Attending Cardiologist [facility]mer C [facility] [place] 09/30/2015, 9:30 AM

## 2015-10-01 LAB — HEMOGLOBIN A1C
Hgb A1c MFr Bld: 6.1 % — ABNORMAL HIGH (ref <5.7)
MEAN PLASMA GLUCOSE: 128 mg/dL — AB (ref <117)

## 2015-10-01 NOTE — Addendum Note (Signed)
Addended by: Pixie Casino on: 10/01/2015 11:00 AM   Modules accepted: Orders

## 2015-10-02 ENCOUNTER — Other Ambulatory Visit: Payer: Self-pay | Admitting: *Deleted

## 2015-10-02 LAB — T4, FREE: FREE T4: 0.9 ng/dL (ref 0.80–1.80)

## 2015-10-02 LAB — T3: T3 TOTAL: 98.1 ng/dL (ref 80.0–204.0)

## 2015-10-02 MED ORDER — BENICAR 40 MG PO TABS: 40.0000 mg | ORAL_TABLET | Freq: Every day | ORAL | Status: DC

## 2015-10-02 NOTE — Telephone Encounter (Signed)
Patient was changed from valsartan 320 to irbesartan 300mg  after cost being too much for her. Valsartan was $40/month.  Irbesartan not covered with insurance benicar (brand), valsartan, losartan are all on patient's formulary.   Spoke with patient and she agreed to try Benicar 40mg  (per MD).  She was advised to call back if this will not be cost effective for her.

## 2015-10-07 ENCOUNTER — Telehealth: Payer: Self-pay | Admitting: Internal Medicine

## 2015-10-07 NOTE — Telephone Encounter (Signed)
Benicar 40mg  was too costly for patient. She will resume valsartan 320mg  once daily. Med list updated.

## 2015-10-22 ENCOUNTER — Telehealth: Payer: Self-pay | Admitting: Internal Medicine

## 2015-10-22 NOTE — Telephone Encounter (Addendum)
Meghan Lynn called in stating that she faxed over a request for the pt to receive Nexium and Niacin ER 500 mg in their generic form for the pt could afford them. Please f/u with her if you need to  Thanks

## 2015-10-22 NOTE — Telephone Encounter (Signed)
Noted  

## 2015-10-24 ENCOUNTER — Telehealth: Payer: Self-pay | Admitting: *Deleted

## 2015-10-24 MED ORDER — NIACIN ER (ANTIHYPERLIPIDEMIC) 500 MG PO TBCR: 500.0000 mg | EXTENDED_RELEASE_TABLET | Freq: Every day | ORAL | Status: DC

## 2015-10-24 MED ORDER — ESOMEPRAZOLE MAGNESIUM 20 MG PO CPDR: 20.0000 mg | DELAYED_RELEASE_CAPSULE | Freq: Every day | ORAL | Status: DC

## 2015-10-24 NOTE — Telephone Encounter (Signed)
Dr. Debara Pickett said that we can give her a RX for Nexium 20mg , will giver her a month and advise her to have her PCP follow from here, or she can get it OTC. Pt expressed understanding

## 2015-10-24 NOTE — Telephone Encounter (Signed)
You can give her an Rx for it, if she doesn't have a PCP. She can also get nexium 20 mg tablets OTC.  Dr. Lemmie Evens

## 2015-10-27 ENCOUNTER — Other Ambulatory Visit: Payer: Self-pay

## 2015-10-28 ENCOUNTER — Other Ambulatory Visit: Payer: Self-pay | Admitting: Internal Medicine

## 2015-10-28 MED ORDER — VALSARTAN 320 MG PO TABS: 320.0000 mg | ORAL_TABLET | Freq: Every day | ORAL | Status: DC

## 2015-10-28 MED ORDER — ATORVASTATIN CALCIUM 20 MG PO TABS: 20.0000 mg | ORAL_TABLET | Freq: Every day | ORAL | Status: DC

## 2015-11-01 ENCOUNTER — Other Ambulatory Visit: Payer: Self-pay | Admitting: Internal Medicine

## 2015-11-05 ENCOUNTER — Other Ambulatory Visit: Payer: Self-pay

## 2015-11-05 MED ORDER — ESOMEPRAZOLE MAGNESIUM 20 MG PO CPDR: 20.0000 mg | DELAYED_RELEASE_CAPSULE | Freq: Every day | ORAL | Status: DC

## 2015-11-05 MED ORDER — NIACIN ER (ANTIHYPERLIPIDEMIC) 500 MG PO TBCR: 500.0000 mg | EXTENDED_RELEASE_TABLET | Freq: Every day | ORAL | Status: DC

## 2015-11-28 ENCOUNTER — Ambulatory Visit (INDEPENDENT_AMBULATORY_CARE_PROVIDER_SITE_OTHER): Payer: Medicare Other | Admitting: Family Medicine

## 2015-11-28 VITALS — BP 140/90 | HR 88 | Temp 98.7°F | Resp 18 | Ht 60.0 in | Wt 141.8 lb

## 2015-11-28 DIAGNOSIS — J209 Acute bronchitis, unspecified: Secondary | ICD-10-CM

## 2015-11-28 DIAGNOSIS — J069 Acute upper respiratory infection, unspecified: Secondary | ICD-10-CM

## 2015-11-28 DIAGNOSIS — F17200 Nicotine dependence, unspecified, uncomplicated: Secondary | ICD-10-CM | POA: Diagnosis not present

## 2015-11-28 MED ORDER — HYDROCODONE-HOMATROPINE 5-1.5 MG/5ML PO SYRP: 5.0000 mL | ORAL_SOLUTION | ORAL | Status: DC | PRN

## 2015-11-28 MED ORDER — MOMETASONE FUROATE 50 MCG/ACT NA SUSP: 2.0000 | Freq: Every day | NASAL | Status: DC

## 2015-11-28 MED ORDER — BENZONATATE 100 MG PO CAPS: 100.0000 mg | ORAL_CAPSULE | Freq: Three times a day (TID) | ORAL | Status: DC | PRN

## 2015-11-28 MED ORDER — AMOXICILLIN 875 MG PO TABS: 875.0000 mg | ORAL_TABLET | Freq: Two times a day (BID) | ORAL | Status: DC

## 2015-11-28 NOTE — Progress Notes (Signed)
Patient ID: Meghan Lynn, female    DOB: 03-20-1951  Age: 63 y.o. MRN: [id number]  Chief Complaint  Patient presents with  . Ear Pain  . Sore Throat    x 2 week   . Cough    yellow phlegm    Subjective:  Patient has been having 2 weeks of headache congestion and coughing. She's bringing up purulent phlegm. She continues to smoke. She says her quit date was last month. She is on Wellbutrin from another physician for this. She has quit with Chantix in the past but always goes back to smoking. She has not been running fevers. She does have head congestion and some ear pains. The throat is been sore. She gets a lot of respiratory tract infections.   Current allergies, medications, problem list, past/family and social histories reviewed.  Objective:  BP 140/90 mmHg  Pulse 88  Temp(Src) 98.7 F (37.1 C) (Oral)  Resp 18  Ht 5' (1.524 m)  Wt 141 lb 12.8 oz (64.32 kg)  BMI 27.69 kg/m2  SpO2 97%  Coughing and congested. TMs normal. Throat clear. Neck supple without significant nodes. Chest is clear to auscultation. Heart regular without murmurs. Smells of tobacco smoke.  Assessment & Plan:   Assessment: 1. Acute bronchitis, unspecified organism   2. Tobacco use disorder   3. Acute upper respiratory infection       Plan: Had a long talk with her about stopping smoking. She needs to stick with it. I told her she had to stop making excuses for continuing smoking. I printed out a counselor for her and help right now is scheduled for her plan.  No orders of the defined types were placed in this encounter.    Meds ordered this encounter  Medications  . amoxicillin (AMOXIL) 875 MG tablet    Sig: Take 1 tablet (875 mg total) by mouth 2 (two) times daily.    Dispense:  20 tablet    Refill:  0  . HYDROcodone-homatropine (HYCODAN) 5-1.5 MG/5ML syrup    Sig: Take 5 mLs by mouth every 4 (four) hours as needed.    Dispense:  120 mL    Refill:  0  . mometasone (NASONEX) 50 MCG/ACT  nasal spray    Sig: Place 2 sprays into the nose daily.    Dispense:  17 g    Refill:  1  . benzonatate (TESSALON) 100 MG capsule    Sig: Take 1-2 capsules (100-200 mg total) by mouth 3 (three) times daily as needed.    Dispense:  30 capsule    Refill:  0         Patient Instructions  Drink plenty of fluids and get enough rest  Take the amoxicillin 875 mg one twice daily  Take the Hycodan cough syrup 1 teaspoon every 4-6 hours as needed. This is best at nighttime or when it will not harm you to be drowsy.  Take the benzonatate 1 or 2 pills 3 times daily as needed for daytime cough  Use the mometasone nose spray 2 sprays each nostril twice daily for 5 days, then once daily. This is to try and keep the eustachian tubes open and the sinuses open so they do not stay to congested.  If necessary you can take over-the-counter Claritin-D or Allegra-D or Zyrtec-D for drainage and congestion(generics loratadine D, fexofenadine D, or cetirizine D)  Return such as if more congestion or pain or fever or shortness of breath  Once again I urged  you to pick a quit date for when you will no longer smoking any more cigarettes and stick with it. Even if you're miserable stay away from the cigarettes. Write in on your calendar 9 daily for 3 days, a daily for 3 days, 7 daily for 3 days, etc. until you get to 5 cigarettes a day. At that point you will quit cigarettes completely and not touch another cigarette. Unless you make a clean break from cigarettes and don't ever touch another one, you will keep yourself addicted to them and is for your health.     Uses calendar as we discussed to stick with your plans on quitting smoking.  No Follow-up on file.   HOPPER,DAVID, MD 11/28/2015

## 2015-11-28 NOTE — Patient Instructions (Addendum)
Drink plenty of fluids and get enough rest  Take the amoxicillin 875 mg one twice daily  Take the Hycodan cough syrup 1 teaspoon every 4-6 hours as needed. This is best at nighttime or when it will not harm you to be drowsy.  Take the benzonatate 1 or 2 pills 3 times daily as needed for daytime cough  Use the mometasone nose spray 2 sprays each nostril twice daily for 5 days, then once daily. This is to try and keep the eustachian tubes open and the sinuses open so they do not stay to congested.  If necessary you can take over-the-counter Claritin-D or Allegra-D or Zyrtec-D for drainage and congestion(generics loratadine D, fexofenadine D, or cetirizine D)  Return such as if more congestion or pain or fever or shortness of breath  Once again I urged you to pick a quit date for when you will no longer smoking any more cigarettes and stick with it. Even if you're miserable stay away from the cigarettes. Write in on your calendar 9 daily for 3 days, a daily for 3 days, 7 daily for 3 days, etc. until you get to 5 cigarettes a day. At that point you will quit cigarettes completely and not touch another cigarette. Unless you make a clean break from cigarettes and don't ever touch another one, you will keep yourself addicted to them and is for your health.

## 2016-04-21 ENCOUNTER — Encounter (HOSPITAL_COMMUNITY): Payer: Self-pay | Admitting: *Deleted

## 2016-04-21 DIAGNOSIS — K311 Adult hypertrophic pyloric stenosis: Secondary | ICD-10-CM | POA: Diagnosis not present

## 2016-04-21 DIAGNOSIS — R112 Nausea with vomiting, unspecified: Secondary | ICD-10-CM | POA: Diagnosis not present

## 2016-04-27 ENCOUNTER — Other Ambulatory Visit: Payer: Self-pay | Admitting: Gastroenterology

## 2016-04-29 ENCOUNTER — Encounter (HOSPITAL_COMMUNITY)
Admission: RE | Disposition: A | Discharge status: Home / Self Care | Payer: Self-pay | Source: Ambulatory Visit | Attending: Gastroenterology

## 2016-04-29 ENCOUNTER — Ambulatory Visit (HOSPITAL_COMMUNITY): Payer: Medicare Other | Admitting: Registered Nurse

## 2016-04-29 ENCOUNTER — Encounter (HOSPITAL_COMMUNITY): Payer: Self-pay | Admitting: Registered Nurse

## 2016-04-29 ENCOUNTER — Ambulatory Visit (HOSPITAL_COMMUNITY)
Admission: RE | Admit: 2016-04-29 | Discharge: 2016-04-29 | Disposition: A | Discharge status: Home / Self Care | Payer: Medicare Other | Source: Ambulatory Visit | Attending: Gastroenterology | Admitting: Gastroenterology

## 2016-04-29 DIAGNOSIS — Z955 Presence of coronary angioplasty implant and graft: Secondary | ICD-10-CM | POA: Diagnosis not present

## 2016-04-29 DIAGNOSIS — M1990 Unspecified osteoarthritis, unspecified site: Secondary | ICD-10-CM | POA: Insufficient documentation

## 2016-04-29 DIAGNOSIS — K219 Gastro-esophageal reflux disease without esophagitis: Secondary | ICD-10-CM | POA: Insufficient documentation

## 2016-04-29 DIAGNOSIS — I2510 Atherosclerotic heart disease of native coronary artery without angina pectoris: Secondary | ICD-10-CM | POA: Diagnosis not present

## 2016-04-29 DIAGNOSIS — K311 Adult hypertrophic pyloric stenosis: Secondary | ICD-10-CM | POA: Diagnosis not present

## 2016-04-29 DIAGNOSIS — E785 Hyperlipidemia, unspecified: Secondary | ICD-10-CM | POA: Insufficient documentation

## 2016-04-29 DIAGNOSIS — I10 Essential (primary) hypertension: Secondary | ICD-10-CM | POA: Diagnosis not present

## 2016-04-29 DIAGNOSIS — Z79899 Other long term (current) drug therapy: Secondary | ICD-10-CM | POA: Diagnosis not present

## 2016-04-29 DIAGNOSIS — Z7982 Long term (current) use of aspirin: Secondary | ICD-10-CM | POA: Diagnosis not present

## 2016-04-29 DIAGNOSIS — F17210 Nicotine dependence, cigarettes, uncomplicated: Secondary | ICD-10-CM | POA: Insufficient documentation

## 2016-04-29 DIAGNOSIS — R112 Nausea with vomiting, unspecified: Secondary | ICD-10-CM | POA: Diagnosis present

## 2016-04-29 HISTORY — PX: ESOPHAGOGASTRODUODENOSCOPY (EGD) WITH PROPOFOL: SHX5813

## 2016-04-29 HISTORY — PX: BALLOON DILATION: SHX5330

## 2016-04-29 SURGERY — ESOPHAGOGASTRODUODENOSCOPY (EGD) WITH PROPOFOL
Anesthesia: Monitor Anesthesia Care

## 2016-04-29 MED ORDER — SODIUM CHLORIDE 0.9 % IV SOLN: INTRAVENOUS | Status: DC

## 2016-04-29 MED ORDER — LIDOCAINE HCL (CARDIAC) 20 MG/ML IV SOLN
INTRAVENOUS | Status: DC | PRN
  Administered 2016-04-29: 100 mg via INTRAVENOUS

## 2016-04-29 MED ORDER — ALBUTEROL SULFATE HFA 108 (90 BASE) MCG/ACT IN AERS
INHALATION_SPRAY | RESPIRATORY_TRACT | Status: DC | PRN
  Administered 2016-04-29 (×2): 6 via RESPIRATORY_TRACT

## 2016-04-29 MED ORDER — GLYCOPYRROLATE 0.2 MG/ML IJ SOLN
INTRAMUSCULAR | Status: DC | PRN
  Administered 2016-04-29: 0.2 mg via INTRAVENOUS

## 2016-04-29 MED ORDER — LACTATED RINGERS IV SOLN
INTRAVENOUS | Status: DC
  Administered 2016-04-29: 1000 mL via INTRAVENOUS

## 2016-04-29 MED ORDER — ALBUTEROL SULFATE (2.5 MG/3ML) 0.083% IN NEBU
2.5000 mg | INHALATION_SOLUTION | Freq: Once | RESPIRATORY_TRACT | Status: AC
  Administered 2016-04-29: 2.5 mg via RESPIRATORY_TRACT

## 2016-04-29 MED ORDER — LIDOCAINE HCL (CARDIAC) 20 MG/ML IV SOLN
INTRAVENOUS | Status: AC
  Filled 2016-04-29: qty 5

## 2016-04-29 MED ORDER — PROPOFOL 500 MG/50ML IV EMUL
INTRAVENOUS | Status: DC | PRN
  Administered 2016-04-29: 350 ug/kg/min via INTRAVENOUS

## 2016-04-29 MED ORDER — PROPOFOL 10 MG/ML IV BOLUS
INTRAVENOUS | Status: AC
  Filled 2016-04-29: qty 60

## 2016-04-29 MED ORDER — ALBUTEROL SULFATE HFA 108 (90 BASE) MCG/ACT IN AERS: 1.0000 | INHALATION_SPRAY | Freq: Every day | RESPIRATORY_TRACT | Status: DC

## 2016-04-29 MED ORDER — ALBUTEROL SULFATE (2.5 MG/3ML) 0.083% IN NEBU
INHALATION_SOLUTION | RESPIRATORY_TRACT | Status: AC
  Filled 2016-04-29: qty 3

## 2016-04-29 MED ORDER — GLYCOPYRROLATE 0.2 MG/ML IJ SOLN
INTRAMUSCULAR | Status: AC
  Filled 2016-04-29: qty 1

## 2016-04-29 MED ORDER — LABETALOL HCL 5 MG/ML IV SOLN
INTRAVENOUS | Status: DC | PRN
  Administered 2016-04-29 (×3): 5 mg via INTRAVENOUS

## 2016-04-29 MED ORDER — PROPOFOL 10 MG/ML IV BOLUS
INTRAVENOUS | Status: DC | PRN
  Administered 2016-04-29 (×2): 50 mg via INTRAVENOUS
  Administered 2016-04-29 (×2): 30 mg via INTRAVENOUS

## 2016-04-29 MED ORDER — BUTAMBEN-TETRACAINE-BENZOCAINE 2-2-14 % EX AERO
INHALATION_SPRAY | CUTANEOUS | Status: DC | PRN
  Administered 2016-04-29: 2 via TOPICAL

## 2016-04-29 SURGICAL SUPPLY — 15 items

## 2016-04-29 NOTE — Anesthesia Preprocedure Evaluation (Addendum)
Anesthesia Evaluation  Patient identified by MRN, date of birth, ID band Patient awake    Reviewed: Allergy & Precautions, H&P , NPO status , Patient's Chart, lab work & pertinent test results, reviewed documented beta blocker date and time   Airway Mallampati: II  TM Distance: >3 FB Neck ROM: Full    Dental  (+) Teeth Intact   Pulmonary asthma , Current Smoker,    + rhonchi        Cardiovascular hypertension, Pt. on medications + angina + CAD and + Cardiac Stents   Rhythm:Regular Rate:Normal  CAD - 3 stents to RCA, has been doing well though overall, follows Dr. Debara Pickett with cardiology regularly  Continues to smoke   Neuro/Psych  Headaches, PSYCHIATRIC DISORDERS Depression    GI/Hepatic Neg liver ROS, GERD  Controlled,  Endo/Other  negative endocrine ROS  Renal/GU negative Renal ROS     Musculoskeletal  (+) Arthritis ,   Abdominal   Peds  Hematology   Anesthesia Other Findings   Reproductive/Obstetrics                           Anesthesia Physical  Anesthesia Plan  ASA: III  Anesthesia Plan: MAC   Post-op Pain Management:    Induction: Intravenous  Airway Management Planned: Nasal Cannula  Additional Equipment:   Intra-op Plan:   Post-operative Plan:   Informed Consent: I have reviewed the patients History and Physical, chart, labs and discussed the procedure including the risks, benefits and alternatives for the proposed anesthesia with the patient or authorized representative who has indicated his/her understanding and acceptance.   Dental advisory given  Plan Discussed with: Anesthesiologist, CRNA and Surgeon  Anesthesia Plan Comments: (Extensive smoking history, consider ETT with lidocaine LTA for future EGD's given persistent cough and airway reactivity)       Anesthesia Quick Evaluation

## 2016-04-29 NOTE — Transfer of Care (Addendum)
Immediate Anesthesia Transfer of Care Note  Patient: Meghan Lynn  Procedure(s) Performed: Procedure(s): ESOPHAGOGASTRODUODENOSCOPY (EGD) WITH PROPOFOL (N/A) BALLOON DILATION (N/A)  Patient Location: PACU  Anesthesia Type:MAC  Level of Consciousness: sedated, patient cooperative and responds to stimulation  Airway & Oxygen Therapy: Patient Spontanous Breathing and Patient connected to nasal cannula oxygen  Post-op Assessment: Report given to RN, Post -op Vital signs reviewed and stable and Patient moving all extremities X 4  Post vital signs: stable  Last Vitals:  Filed Vitals:   04/29/16 1010 04/29/16 1209  BP: 171/104 145/86  Pulse: 79 93  Temp: 36.8 C   Resp: 12 21    Last Pain: There were no vitals filed for this visit.       Complications: No apparent anesthesia complications  to receive albuteral treatment in PACU

## 2016-04-29 NOTE — H&P (Signed)
[facility] Admission History & Physical  Chief Complaint: Nausea and vomiting HPI: Meghan Lynn is an 65 y.o. black female.  With known pyloric stenosis presumed secondary to prior peptic ulcer disease, with several successful balloon dilatations in the past, she presents with progressive early satiety anorexia nausea and vomiting consistent with previous presentations.  Past Medical History  Diagnosis Date  . Coronary artery disease     3 stents to RCA  . Hypertension   . Arthritis   . Hyperlipidemia   . Active smoker   . Bronchitis due to tobacco use ([place])   . GERD (gastroesophageal reflux disease)   . Headache(784.0)   . Headache, migraine     daily  . Asthma   . Peptic ulcer disease   . Gastritis     Past Surgical History  Procedure Laterality Date  . Orif elbow fracture  2006  . Coronary angioplasty  2004    stents x 3  . Fracture surgery    . Cardiovascular stress test  2010  . Esophagogastroduodenoscopy N/A 05/30/2013    Procedure: ESOPHAGOGASTRODUODENOSCOPY (EGD);  Surgeon: Missy Sabins, MD;  Location: [facility] ENDOSCOPY;  Service: Endoscopy;  Laterality: N/A;  . Balloon dilation N/A 05/30/2013    Procedure: BALLOON DILATION;  Surgeon: Missy Sabins, MD;  Location: [place];  Service: Endoscopy;  Laterality: N/A;  . Esophagogastroduodenoscopy N/A 08/02/2013    Procedure: ESOPHAGOGASTRODUODENOSCOPY (EGD);  Surgeon: Missy Sabins, MD;  Location: [facility] ENDOSCOPY;  Service: Endoscopy;  Laterality: N/A;  barb Greggory [name]  . Balloon dilation N/A 08/02/2013    Procedure: BALLOON DILATION;  Surgeon: Missy Sabins, MD;  Location: [facility] ENDOSCOPY;  Service: Endoscopy;  Laterality: N/A;  . Transthoracic echocardiogram  07/2008    normal LV function, mild MR, mild TR, trace pulm valve regurg  . Cardiolite myocardial perfusion study  05/2006    positive bruce protocol, low risk, EF 80%  . Cardiac catheterization  03/07/2002    moderte CAD 80% (mid RCA) - Dr. Myrtice Lauth  . Cardiac  catheterization  09/13/2003    Cypher 2.5x33mm and 2.5x18 and Taxus 2.75x71mm to distal, mid, prox RCA (Dr. Gerrie Nordmann)    Medications Prior to Admission  Medication Sig Dispense Refill  . aspirin EC 81 MG tablet Take 81 mg by mouth daily.     [name] Kitchen atorvastatin (LIPITOR) 20 MG tablet Take 1 tablet (20 mg total) by mouth daily. 90 tablet 3  . esomeprazole (NEXIUM) 20 MG capsule Take 1 capsule (20 mg total) by mouth daily at 12 noon. 30 capsule 11  . metoprolol succinate (TOPROL-XL) 25 MG 24 hr tablet take 1 tablet by mouth once daily (Patient taking differently: take 1 tablet by mouth at bedtime.) 30 tablet 10  . Multiple Vitamin (MULTIVITAMIN WITH MINERALS) TABS Take 1 tablet by mouth daily.    . niacin (NIASPAN) 500 MG CR tablet Take 1 tablet (500 mg total) by mouth at bedtime. 90 tablet 3  . RABEprazole (ACIPHEX) 20 MG tablet Take 20 mg by mouth 2 (two) times daily.  0  . valsartan (DIOVAN) 320 MG tablet Take 1 tablet (320 mg total) by mouth daily. 90 tablet 3  . buPROPion (WELLBUTRIN SR) 150 MG 12 hr tablet Take 1 tablet daily for 3 days then increase to 1 tablet twice daily. Do not abruptly stop this medication. (Patient not taking: Reported on 04/21/2016) 60 tablet 5    Allergies:  Allergies  Allergen Reactions  . Propoxyphene Other (See Comments)  Darvocet - severe nausea and vomiting  . Latex Itching    Family History  Problem Relation Age of Onset  . Diabetes Mother   . CAD Mother     CABG  . Bone cancer Father   . Hypertension Father   . Heart disease Brother   . Kidney disease Sister   . Cancer Sister   . Diabetes Sister   . Hyperlipidemia Sister   . Hypertension Sister     Social History:  reports that she has been smoking Cigarettes.  She has a 22.5 pack-year smoking history. She has never used smokeless tobacco. She reports that she does not drink alcohol or use illicit drugs.  Review of Systems: negative except As above   Blood pressure 171/104, pulse 79,  temperature 98.2 F (36.8 C), temperature source Oral, resp. rate 12, height 5\' 1"  (1.549 m), weight 63.957 kg (141 lb), SpO2 98 %. Head: Normocephalic, without obvious abnormality, atraumatic Neck: no adenopathy, no carotid bruit, no JVD, supple, symmetrical, trachea midline and thyroid not enlarged, symmetric, no tenderness/mass/nodules Resp: clear to auscultation bilaterally Cardio: regular rate and rhythm, S1, S2 normal, no murmur, click, rub or gallop GI: abdomen soft, nontender Extremities: extremities normal, atraumatic, no cyanosis or edema  No results found for this or any previous visit (from the past 48 hour(s)). No results found.  Assessment: Pyloric Stenosis, acquired, symptomatic Plan: EGD with pyloric dilatation  [name] 04/29/2016, 11:14 AM

## 2016-04-29 NOTE — Discharge Instructions (Signed)

## 2016-04-29 NOTE — Op Note (Signed)
[facility] Patient Name: Meghan Lynn Procedure Date: 04/29/2016 MRN: [id number] Attending MD: Missy Sabins , MD Date of Birth: 07/13/1951 CSN: [id number] Age: 65 Admit Type: Outpatient Procedure:                Upper GI endoscopy Indications:              Pyloric stenosis Providers:                Elyse Jarvis. Amedeo Plenty, MD, Carolynn Comment, RN, Alfonso Patten, Technician, Enrigue Catena, CRNA Referring MD:              Medicines:                Propofol per Anesthesia Complications:            No immediate complications. Estimated Blood Loss:     Estimated blood loss: none. Procedure:                Pre-Anesthesia Assessment:                           - Prior to the procedure, a History and Physical                            was performed, and patient medications and                            allergies were reviewed. The patient's tolerance of                            previous anesthesia was also reviewed. The risks                            and benefits of the procedure and the sedation                            options and risks were discussed with the patient.                            All questions were answered, and informed consent                            was obtained. Prior Anticoagulants: The patient has                            taken no previous anticoagulant or antiplatelet                            agents. ASA Grade Assessment: II - A patient with                            mild systemic disease. After reviewing the risks  and benefits, the patient was deemed in                            satisfactory condition to undergo the procedure.                           After obtaining informed consent, the endoscope was                            passed under direct vision. Throughout the                            procedure, the patient's blood pressure, pulse, and                            oxygen saturations  were monitored continuously. The                            [id number]) scope was introduced through the                            mouth, and advanced to the pylorus. The upper GI                            endoscopy was accomplished without difficulty. The                            patient tolerated the procedure well. Scope In: Scope Out: Findings:      The examined esophagus was normal.      A benign-appearing, intrinsic severe stenosis was found at the pylorus.       This was non-traversed. A TTS dilator was passed through the scope.       Dilation with a 10-07-11 mm pyloric balloon dilator was performed. The       dilation site was examined following endoscope reinsertion and showed       moderate improvement in luminal narrowing. Estimated blood loss was       minimal.      A medium amount of food (residue) was found on the greater curvature of       the stomach. Impression:               - Normal esophagus.                           - Gastric stenosis was found at the pylorus.                            Dilated.                           - No specimens collected. Moderate Sedation:      N/A- Per Anesthesia Care Recommendation:           - Full liquid diet for 2 days.                           -  Continue present medications.                           - Return to my office in 3 weeks. Procedure Code(s):        --- Professional ---                           [phone], 76, Esophagogastroduodenoscopy, flexible,                            transoral; with dilation of gastric/duodenal                            stricture(s) (eg, balloon, bougie) Diagnosis Code(s):        --- Professional ---                           K31.1, Adult hypertrophic pyloric stenosis CPT copyright 2016 American Medical Association. All rights reserved. The codes documented in this report are preliminary and upon coder review may  be revised to meet current compliance requirements. Teena Irani, MD Missy Sabins, MD 04/29/2016 11:52:30 AM This report has been signed electronically. Number of Addenda: 0

## 2016-05-02 ENCOUNTER — Encounter (HOSPITAL_COMMUNITY): Payer: Self-pay | Admitting: Gastroenterology

## 2016-05-05 NOTE — Anesthesia Postprocedure Evaluation (Signed)
Anesthesia Post Note  Patient: Norval Gable  Procedure(s) Performed: Procedure(s) (LRB): ESOPHAGOGASTRODUODENOSCOPY (EGD) WITH PROPOFOL (N/A) BALLOON DILATION (N/A)  Patient location during evaluation: PACU Anesthesia Type: MAC Level of consciousness: awake and alert Pain management: pain level controlled Vital Signs Assessment: post-procedure vital signs reviewed and stable Respiratory status: spontaneous breathing, nonlabored ventilation, respiratory function stable and patient connected to nasal cannula oxygen Cardiovascular status: stable and blood pressure returned to baseline Anesthetic complications: no    Last Vitals:  Filed Vitals:   04/29/16 1229 04/29/16 1239  BP: 103/74 123/79  Pulse: 94 89  Temp:    Resp: 25 28    Last Pain: There were no vitals filed for this visit.               Zenaida Deed

## 2016-05-21 ENCOUNTER — Telehealth: Payer: Self-pay | Admitting: Internal Medicine

## 2016-05-21 ENCOUNTER — Other Ambulatory Visit: Payer: Self-pay | Admitting: Internal Medicine

## 2016-05-21 MED ORDER — ESOMEPRAZOLE MAGNESIUM 20 MG PO CPDR: 20.0000 mg | DELAYED_RELEASE_CAPSULE | Freq: Every day | ORAL | Status: DC

## 2016-05-21 NOTE — Telephone Encounter (Signed)
Spoke with patient after receiving an Rx for rabeprazole 20mg  BID (aciphex) which was e-sent earlier  Received a PA notification for this medication and upon chart review, rabeprazole was not on patient's formulary as of 08/13/2015 (tele note in EPIC).  Explained at this time, MD changed her to nexium Rx(s) sent to pharmacy electronically for nexium  Advised patient to contact us if she has issues with this medication being covered  Routed to MD as Juluis Rainier

## 2016-05-21 NOTE — Telephone Encounter (Signed)
Rx(s) sent to pharmacy electronically.  

## 2016-07-26 DIAGNOSIS — Z01419 Encounter for gynecological examination (general) (routine) without abnormal findings: Secondary | ICD-10-CM | POA: Diagnosis not present

## 2016-07-26 DIAGNOSIS — R875 Abnormal microbiological findings in specimens from female genital organs: Secondary | ICD-10-CM | POA: Diagnosis not present

## 2016-07-26 DIAGNOSIS — Z124 Encounter for screening for malignant neoplasm of cervix: Secondary | ICD-10-CM | POA: Diagnosis not present

## 2016-08-05 ENCOUNTER — Other Ambulatory Visit: Payer: Self-pay | Admitting: Obstetrics and Gynecology

## 2016-08-05 DIAGNOSIS — N760 Acute vaginitis: Secondary | ICD-10-CM | POA: Diagnosis not present

## 2016-08-05 DIAGNOSIS — N950 Postmenopausal bleeding: Secondary | ICD-10-CM | POA: Diagnosis not present

## 2016-08-05 DIAGNOSIS — R938 Abnormal findings on diagnostic imaging of other specified body structures: Secondary | ICD-10-CM | POA: Diagnosis not present

## 2016-08-05 DIAGNOSIS — A5901 Trichomonal vulvovaginitis: Secondary | ICD-10-CM | POA: Diagnosis not present

## 2016-08-05 DIAGNOSIS — N644 Mastodynia: Secondary | ICD-10-CM

## 2016-08-11 DIAGNOSIS — N950 Postmenopausal bleeding: Secondary | ICD-10-CM | POA: Diagnosis not present

## 2016-08-11 DIAGNOSIS — R938 Abnormal findings on diagnostic imaging of other specified body structures: Secondary | ICD-10-CM | POA: Diagnosis not present

## 2016-08-11 DIAGNOSIS — N9489 Other specified conditions associated with female genital organs and menstrual cycle: Secondary | ICD-10-CM | POA: Diagnosis not present

## 2016-08-12 ENCOUNTER — Telehealth: Payer: Self-pay | Admitting: Internal Medicine

## 2016-08-12 NOTE — Telephone Encounter (Signed)
Pt called,wanted to make sure Dr Debara Pickett received her clearance form.She would like for him to take care of it asap please.

## 2016-08-12 NOTE — Telephone Encounter (Signed)
1. Type of surgery: D & C hysteroscopy/resection of endometrial mass (for post-menopausal bleeding/mass) - est. length of surgery = 1 hour 2. Date of surgery: pending (@ [facility]) 3. Surgeon: Dr. Servando Salina 4. Medications that need to be held & how long: none specified 5. Fax and/or Phone: (p) [phone]  ([phone]  -- attn: Parthenia Ames

## 2016-08-13 NOTE — Telephone Encounter (Signed)
Patient notified of clearance. She is already holding asa.   Clearance routed to Dr. Garwin Brothers via [facility]

## 2016-08-13 NOTE — Telephone Encounter (Signed)
Low risk for surgery. Can hold aspirin 7 days prior to surgery and restart when feasible from a bleeding standpoint afterwards.  Dr. Lemmie Evens

## 2016-08-16 ENCOUNTER — Other Ambulatory Visit: Payer: Self-pay | Admitting: Obstetrics and Gynecology

## 2016-08-17 NOTE — Patient Instructions (Addendum)
Your procedure is scheduled on:  Tomorrow, August 19, 2016  Enter through the [employer] [facility] at:  11:30 AM  Pick up the phone at the desk and dial [phone].  Call this number if you have problems the morning of surgery: [phone].  Remember: Do NOT eat food:  After Midnight Tonight  Do NOT drink clear liquids after:  9:00 AM day of surgery  Take these medicines the morning of surgery with a SIP OF WATER:  Valsartan  Do NOT smoke the day of surgery  Do NOT wear jewelry (body piercing), metal hair clips/bobby pins, make-up, or nail polish. Do NOT wear lotions, powders, or perfumes.  You may wear deodorant. Do NOT shave for 48 hours prior to surgery. Do NOT bring valuables to the hospital. Contacts, dentures, or bridgework may not be worn into surgery.  Have a responsible adult drive you home and stay with you for 24 hours after your procedure

## 2016-08-18 ENCOUNTER — Encounter (HOSPITAL_COMMUNITY): Payer: Self-pay

## 2016-08-18 ENCOUNTER — Encounter (HOSPITAL_COMMUNITY)
Admission: RE | Admit: 2016-08-18 | Discharge: 2016-08-18 | Disposition: A | Discharge status: Home / Self Care | Payer: Medicare Other | Source: Ambulatory Visit | Attending: Obstetrics and Gynecology | Admitting: Obstetrics and Gynecology

## 2016-08-18 DIAGNOSIS — M4316 Spondylolisthesis, lumbar region: Secondary | ICD-10-CM | POA: Diagnosis not present

## 2016-08-18 DIAGNOSIS — K219 Gastro-esophageal reflux disease without esophagitis: Secondary | ICD-10-CM | POA: Diagnosis not present

## 2016-08-18 DIAGNOSIS — N950 Postmenopausal bleeding: Secondary | ICD-10-CM | POA: Diagnosis not present

## 2016-08-18 DIAGNOSIS — I10 Essential (primary) hypertension: Secondary | ICD-10-CM | POA: Diagnosis not present

## 2016-08-18 DIAGNOSIS — F17210 Nicotine dependence, cigarettes, uncomplicated: Secondary | ICD-10-CM | POA: Diagnosis not present

## 2016-08-18 DIAGNOSIS — N840 Polyp of corpus uteri: Secondary | ICD-10-CM | POA: Diagnosis not present

## 2016-08-18 DIAGNOSIS — Z8249 Family history of ischemic heart disease and other diseases of the circulatory system: Secondary | ICD-10-CM | POA: Diagnosis not present

## 2016-08-18 DIAGNOSIS — I2510 Atherosclerotic heart disease of native coronary artery without angina pectoris: Secondary | ICD-10-CM | POA: Diagnosis not present

## 2016-08-18 HISTORY — DX: Other specified disease of esophagus: K22.89

## 2016-08-18 HISTORY — DX: Other disturbances of skin sensation: R20.8

## 2016-08-18 HISTORY — DX: Pneumonia, unspecified organism: J18.9

## 2016-08-18 HISTORY — DX: Other specified diseases of esophagus: K22.8

## 2016-08-18 HISTORY — DX: Angina pectoris, unspecified: I20.9

## 2016-08-18 HISTORY — DX: Reserved for inherently not codable concepts without codable children: IMO0001

## 2016-08-18 LAB — BASIC METABOLIC PANEL
ANION GAP: 6 (ref 5–15)
BUN: 9 mg/dL (ref 6–20)
CALCIUM: 9.2 mg/dL (ref 8.9–10.3)
CO2: 26 mmol/L (ref 22–32)
CREATININE: 0.84 mg/dL (ref 0.44–1.00)
Chloride: 107 mmol/L (ref 101–111)
Glucose, Bld: 116 mg/dL — ABNORMAL HIGH (ref 65–99)
Potassium: 3.9 mmol/L (ref 3.5–5.1)
Sodium: 139 mmol/L (ref 135–145)

## 2016-08-18 LAB — CBC
HEMATOCRIT: 47.7 % — AB (ref 36.0–46.0)
Hemoglobin: 16 g/dL — ABNORMAL HIGH (ref 12.0–15.0)
MCH: 25.8 pg — ABNORMAL LOW (ref 26.0–34.0)
MCHC: 33.5 g/dL (ref 30.0–36.0)
MCV: 76.9 fL — AB (ref 78.0–100.0)
PLATELETS: 209 10*3/uL (ref 150–400)
RBC: 6.2 MIL/uL — AB (ref 3.87–5.11)
RDW: 15 % (ref 11.5–15.5)
WBC: 9.7 10*3/uL (ref 4.0–10.5)

## 2016-08-18 MED ORDER — LACTATED RINGERS IV SOLN: INTRAVENOUS | Status: DC

## 2016-08-19 ENCOUNTER — Ambulatory Visit (HOSPITAL_COMMUNITY): Payer: Medicare Other | Admitting: Anesthesiology

## 2016-08-19 ENCOUNTER — Ambulatory Visit (HOSPITAL_COMMUNITY)
Admission: RE | Admit: 2016-08-19 | Discharge: 2016-08-19 | Disposition: A | Discharge status: Home / Self Care | Payer: Medicare Other | Source: Ambulatory Visit | Attending: Obstetrics and Gynecology | Admitting: Obstetrics and Gynecology

## 2016-08-19 ENCOUNTER — Encounter (HOSPITAL_COMMUNITY): Payer: Self-pay | Admitting: Anesthesiology

## 2016-08-19 ENCOUNTER — Encounter (HOSPITAL_COMMUNITY)
Admission: RE | Disposition: A | Discharge status: Home / Self Care | Payer: Self-pay | Source: Ambulatory Visit | Attending: Obstetrics and Gynecology

## 2016-08-19 DIAGNOSIS — Z8249 Family history of ischemic heart disease and other diseases of the circulatory system: Secondary | ICD-10-CM | POA: Diagnosis not present

## 2016-08-19 DIAGNOSIS — N9489 Other specified conditions associated with female genital organs and menstrual cycle: Secondary | ICD-10-CM | POA: Diagnosis not present

## 2016-08-19 DIAGNOSIS — M1990 Unspecified osteoarthritis, unspecified site: Secondary | ICD-10-CM | POA: Diagnosis not present

## 2016-08-19 DIAGNOSIS — N950 Postmenopausal bleeding: Secondary | ICD-10-CM | POA: Insufficient documentation

## 2016-08-19 DIAGNOSIS — M4316 Spondylolisthesis, lumbar region: Secondary | ICD-10-CM | POA: Insufficient documentation

## 2016-08-19 DIAGNOSIS — N840 Polyp of corpus uteri: Secondary | ICD-10-CM | POA: Insufficient documentation

## 2016-08-19 DIAGNOSIS — I10 Essential (primary) hypertension: Secondary | ICD-10-CM | POA: Insufficient documentation

## 2016-08-19 DIAGNOSIS — F17210 Nicotine dependence, cigarettes, uncomplicated: Secondary | ICD-10-CM | POA: Diagnosis not present

## 2016-08-19 DIAGNOSIS — I2510 Atherosclerotic heart disease of native coronary artery without angina pectoris: Secondary | ICD-10-CM | POA: Insufficient documentation

## 2016-08-19 DIAGNOSIS — K219 Gastro-esophageal reflux disease without esophagitis: Secondary | ICD-10-CM | POA: Diagnosis not present

## 2016-08-19 DIAGNOSIS — N858 Other specified noninflammatory disorders of uterus: Secondary | ICD-10-CM | POA: Diagnosis not present

## 2016-08-19 HISTORY — PX: DILATATION & CURETTAGE/HYSTEROSCOPY WITH MYOSURE: SHX6511

## 2016-08-19 SURGERY — DILATATION & CURETTAGE/HYSTEROSCOPY WITH MYOSURE
Anesthesia: General | Site: Vagina

## 2016-08-19 MED ORDER — PROMETHAZINE HCL 25 MG/ML IJ SOLN: 6.2500 mg | INTRAMUSCULAR | Status: DC | PRN

## 2016-08-19 MED ORDER — FENTANYL CITRATE (PF) 100 MCG/2ML IJ SOLN
INTRAMUSCULAR | Status: DC | PRN
  Administered 2016-08-19 (×2): 25 ug via INTRAVENOUS
  Administered 2016-08-19: 50 ug via INTRAVENOUS

## 2016-08-19 MED ORDER — ALBUTEROL SULFATE HFA 108 (90 BASE) MCG/ACT IN AERS
INHALATION_SPRAY | RESPIRATORY_TRACT | Status: AC
  Filled 2016-08-19: qty 6.7

## 2016-08-19 MED ORDER — GLYCOPYRROLATE 0.2 MG/ML IJ SOLN
INTRAMUSCULAR | Status: DC | PRN
  Administered 2016-08-19: 0.1 mg via INTRAVENOUS

## 2016-08-19 MED ORDER — PROPOFOL 10 MG/ML IV BOLUS
INTRAVENOUS | Status: AC
  Filled 2016-08-19: qty 40

## 2016-08-19 MED ORDER — IBUPROFEN 800 MG PO TABS: 800.0000 mg | ORAL_TABLET | Freq: Three times a day (TID) | ORAL | 1 refills | Status: DC | PRN

## 2016-08-19 MED ORDER — ONDANSETRON HCL 4 MG/2ML IJ SOLN
INTRAMUSCULAR | Status: AC
  Filled 2016-08-19: qty 2

## 2016-08-19 MED ORDER — LABETALOL HCL 5 MG/ML IV SOLN
INTRAVENOUS | Status: DC | PRN
  Administered 2016-08-19 (×2): 5 mg via INTRAVENOUS

## 2016-08-19 MED ORDER — GLYCOPYRROLATE 0.2 MG/ML IJ SOLN
INTRAMUSCULAR | Status: AC
  Filled 2016-08-19: qty 1

## 2016-08-19 MED ORDER — DEXAMETHASONE SODIUM PHOSPHATE 10 MG/ML IJ SOLN
INTRAMUSCULAR | Status: DC | PRN
  Administered 2016-08-19: 4 mg via INTRAVENOUS

## 2016-08-19 MED ORDER — FENTANYL CITRATE (PF) 100 MCG/2ML IJ SOLN
INTRAMUSCULAR | Status: AC
  Filled 2016-08-19: qty 2

## 2016-08-19 MED ORDER — SODIUM CHLORIDE 0.9 % IR SOLN
Status: DC | PRN
  Administered 2016-08-19 (×2): 3000 mL

## 2016-08-19 MED ORDER — MIDAZOLAM HCL 2 MG/2ML IJ SOLN
INTRAMUSCULAR | Status: AC
  Filled 2016-08-19: qty 2

## 2016-08-19 MED ORDER — EPHEDRINE 5 MG/ML INJ
INTRAVENOUS | Status: AC
  Filled 2016-08-19: qty 10

## 2016-08-19 MED ORDER — LIDOCAINE HCL (CARDIAC) 20 MG/ML IV SOLN
INTRAVENOUS | Status: DC | PRN
  Administered 2016-08-19: 20 mg via INTRAVENOUS
  Administered 2016-08-19: 80 mg via INTRAVENOUS

## 2016-08-19 MED ORDER — PHENYLEPHRINE 40 MCG/ML (10ML) SYRINGE FOR IV PUSH (FOR BLOOD PRESSURE SUPPORT)
PREFILLED_SYRINGE | INTRAVENOUS | Status: AC
  Filled 2016-08-19: qty 10

## 2016-08-19 MED ORDER — PROPOFOL 10 MG/ML IV BOLUS
INTRAVENOUS | Status: DC | PRN
  Administered 2016-08-19: 70 mg via INTRAVENOUS
  Administered 2016-08-19: 130 mg via INTRAVENOUS

## 2016-08-19 MED ORDER — ONDANSETRON HCL 4 MG/2ML IJ SOLN
INTRAMUSCULAR | Status: DC | PRN
  Administered 2016-08-19: 4 mg via INTRAVENOUS

## 2016-08-19 MED ORDER — LACTATED RINGERS IV SOLN
INTRAVENOUS | Status: DC
  Administered 2016-08-19: 125 mL/h via INTRAVENOUS
  Administered 2016-08-19: 14:00:00 via INTRAVENOUS

## 2016-08-19 MED ORDER — PHENYLEPHRINE HCL 10 MG/ML IJ SOLN
INTRAMUSCULAR | Status: DC | PRN
  Administered 2016-08-19 (×5): 40 ug via INTRAVENOUS

## 2016-08-19 MED ORDER — LIDOCAINE HCL (CARDIAC) 20 MG/ML IV SOLN
INTRAVENOUS | Status: AC
  Filled 2016-08-19: qty 5

## 2016-08-19 MED ORDER — FENTANYL CITRATE (PF) 100 MCG/2ML IJ SOLN: 25.0000 ug | INTRAMUSCULAR | Status: DC | PRN

## 2016-08-19 MED ORDER — DEXAMETHASONE SODIUM PHOSPHATE 4 MG/ML IJ SOLN
INTRAMUSCULAR | Status: AC
  Filled 2016-08-19: qty 1

## 2016-08-19 MED ORDER — EPHEDRINE SULFATE 50 MG/ML IJ SOLN
INTRAMUSCULAR | Status: DC | PRN
  Administered 2016-08-19: 10 mg via INTRAVENOUS

## 2016-08-19 SURGICAL SUPPLY — 20 items
CANISTER SUCT 3000ML (MISCELLANEOUS) ×3 IMPLANT
CATH ROBINSON RED A/P 16FR (CATHETERS) ×3 IMPLANT
CLOTH BEACON ORANGE TIMEOUT ST (SAFETY) ×3 IMPLANT
CONTAINER PREFILL 10% NBF 60ML (FORM) ×6 IMPLANT
DEVICE MYOSURE LITE (MISCELLANEOUS) IMPLANT
DEVICE MYOSURE REACH (MISCELLANEOUS) ×2 IMPLANT
ELECT REM PT RETURN 9FT ADLT (ELECTROSURGICAL) ×3
ELECTRODE REM PT RTRN 9FT ADLT (ELECTROSURGICAL) ×1 IMPLANT
FILTER ARTHROSCOPY CONVERTOR (FILTER) ×3 IMPLANT
GLOVE BIOGEL PI IND STRL 7.0 (GLOVE) ×2 IMPLANT
GLOVE BIOGEL PI INDICATOR 7.0 (GLOVE) ×4
GLOVE ECLIPSE 6.5 STRL STRAW (GLOVE) ×3 IMPLANT
GOWN STRL REUS W/TWL LRG LVL3 (GOWN DISPOSABLE) ×6 IMPLANT
PACK VAGINAL MINOR WOMEN LF (CUSTOM PROCEDURE TRAY) ×3 IMPLANT
PAD OB MATERNITY 4.3X12.25 (PERSONAL CARE ITEMS) ×3 IMPLANT
SEAL ROD LENS SCOPE MYOSURE (ABLATOR) ×3 IMPLANT
TOWEL OR 17X24 6PK STRL BLUE (TOWEL DISPOSABLE) ×6 IMPLANT
TUBING AQUILEX INFLOW (TUBING) ×3 IMPLANT
TUBING AQUILEX OUTFLOW (TUBING) ×3 IMPLANT
WATER STERILE IRR 1000ML POUR (IV SOLUTION) ×3 IMPLANT

## 2016-08-19 NOTE — H&P (Signed)
Meghan Lynn is an 65 y.o. female G3P3 BF PMP presents for dx hysteroscopy, D&C, hysteroscopic resection of endometrial mass noted on sono hysterogram. Pt was found to have endometrial thickening on sonogram done for PMB. A 4.2cm cystic endometrial mass was found on sono hysterogram with otherwise thin endometrial wall  Pertinent Gynecological History: Menses: post-menopausal Bleeding: post menopausal bleeding Contraception: post menopausal status and tubal ligation DES exposure: denies Blood transfusions: none Sexually transmitted diseases: trichomonasis Previous GYN Procedures: TL  Last mammogram: normal Date: 2017 Last pap: normal Date: 2017 OB History: G3 P3   Menstrual History: Menarche age: n/a No LMP recorded. Patient is postmenopausal.    Past Medical History:  Diagnosis Date  . Active smoker   . Anginal pain ([place])   . Arthritis   . Asthma    patient denies  . Bronchitis due to tobacco use ([place])   . Burning sensation of feet   . Coronary artery disease    3 stents to RCA  . Dilatation of esophagus   . Gastritis   . GERD (gastroesophageal reflux disease)   . 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 Headache, migraine    daily  . Hyperlipidemia   . Hypertension   . Peptic ulcer disease   . Pneumonia   . Shortness of breath dyspnea    with exertion    Past Surgical History:  Procedure Laterality Date  . BACK SURGERY    . BALLOON DILATION N/A 05/30/2013   Procedure: BALLOON DILATION;  Surgeon: Missy Sabins, MD;  Location: [facility] ENDOSCOPY;  Service: Endoscopy;  Laterality: N/A;  . BALLOON DILATION N/A 08/02/2013   Procedure: BALLOON DILATION;  Surgeon: Missy Sabins, MD;  Location: [place];  Service: Endoscopy;  Laterality: N/A;  . BALLOON DILATION N/A 04/29/2016   Procedure: BALLOON DILATION;  Surgeon: Teena Irani, MD;  Location: [facility] ENDOSCOPY;  Service: Endoscopy;  Laterality: N/A;  . CARDIAC CATHETERIZATION  03/07/2002   moderte CAD 80% (mid RCA) - Dr. Myrtice Lauth  . CARDIAC CATHETERIZATION  09/13/2003   Cypher 2.5x68mm and 2.5x18 and Taxus 2.75x33mm to distal, mid, prox RCA (Dr. Gerrie Nordmann)  . CARDIOLITE MYOCARDIAL PERFUSION STUDY  05/2006   positive bruce protocol, low risk, EF 80%  . CARDIOVASCULAR STRESS TEST  2010  . COLONOSCOPY    . CORONARY ANGIOPLASTY  2004   stents x 3  . ESOPHAGOGASTRODUODENOSCOPY N/A 05/30/2013   Procedure: ESOPHAGOGASTRODUODENOSCOPY (EGD);  Surgeon: Missy Sabins, MD;  Location: [facility] ENDOSCOPY;  Service: Endoscopy;  Laterality: N/A;  . ESOPHAGOGASTRODUODENOSCOPY N/A 08/02/2013   Procedure: ESOPHAGOGASTRODUODENOSCOPY (EGD);  Surgeon: Missy Sabins, MD;  Location: [facility] END[facility]COPY;  Service: Endoscopy;  Laterality: N/A;  barb Greggory [name]  . ESOPHAGOGASTRODUODENOSCOPY (EGD) WITH PROPOFOL N/A 04/29/2016   Procedure: ESOPHAGOGASTRODUODENOSCOPY (EGD) WITH PROPOFOL;  Surgeon: Teena Irani, MD;  Location: [facility] ENDOSCOPY;  Service: Endoscopy;  Laterality: N/A;  . FRACTURE SURGERY    . ORIF ELBOW FRACTURE  2006  . TRANSTHORACIC ECHOCARDIOGRAM  07/2008   normal LV function, mild MR, mild TR, trace pulm valve regurg    Family History  Problem Relation Age of Onset  . Diabetes Mother   . CAD Mother     CABG  . Bone cancer Father   . Hypertension Father   . Heart disease Brother   . Kidney disease Sister   . Cancer Sister   . Diabetes Sister   . Hyperlipidemia Sister   . Hypertension Sister     Social  History:  reports that she has been smoking Cigarettes.  She has a 22.50 pack-year smoking history. She has never used smokeless tobacco. She reports that she drinks alcohol. She reports that she does not use drugs.  Allergies:  Allergies  Allergen Reactions  . Propoxyphene Other (See Comments)    Darvocet - severe nausea and vomiting  . Latex Itching    No prescriptions prior to admission.    Review of Systems  All other systems reviewed and are negative.   There were no vitals taken for this visit. Physical Exam  Constitutional:  She is oriented to person, place, and time. She appears well-developed and well-nourished.  HENT:  Head: Atraumatic.  Eyes: EOM are normal.  Neck: Neck supple.  Cardiovascular: Regular rhythm.   Respiratory: Breath sounds normal.  GI: Soft.  Genitourinary: Vagina normal and uterus normal.  Genitourinary Comments: Cervix nl Adnexa no palp mass Atrophic vagina  Musculoskeletal: Normal range of motion.  Neurological: She is alert and oriented to person, place, and time.  Skin: Skin is warm and dry.  Psychiatric: She has a normal mood and affect.    Results for orders placed or performed during the hospital encounter of 08/18/16 (from the past 24 hour(s))  CBC     Status: Abnormal   Collection Time: 08/18/16  3:30 PM  Result Value Ref Range   WBC 9.7 4.0 - 10.5 K/uL   RBC 6.20 (H) 3.87 - 5.11 MIL/uL   Hemoglobin 16.0 (H) 12.0 - 15.0 g/dL   HCT 47.7 (H) 36.0 - 46.0 %   MCV 76.9 (L) 78.0 - 100.0 fL   MCH 25.8 (L) 26.0 - 34.0 pg   MCHC 33.5 30.0 - 36.0 g/dL   RDW 15.0 11.5 - 15.5 %   Platelets 209 150 - 400 K/uL  Basic metabolic panel     Status: Abnormal   Collection Time: 08/18/16  3:30 PM  Result Value Ref Range   Sodium 139 135 - 145 mmol/L   Potassium 3.9 3.5 - 5.1 mmol/L   Chloride 107 101 - 111 mmol/L   CO2 26 22 - 32 mmol/L   Glucose, Bld 116 (H) 65 - 99 mg/dL   BUN 9 6 - 20 mg/dL   Creatinine, Ser 0.84 0.44 - 1.00 mg/dL   Calcium 9.2 8.9 - 10.3 mg/dL   GFR calc non Af Amer >60 >60 mL/min   GFR calc Af Amer >60 >60 mL/min   Anion gap 6 5 - 15    No results found.  Assessment/Plan: PMB Endometrial mass P) dx hysteroscopy, hysteroscopic resection of endometrial mass using Myosure, D&C. Risk of procedure reviewed including infection, bleeding, injury to surrounding organ structures, thermal injury, fluid overload and its mgmt, uterine perforation( 12/998) and its risk. All ? Answered. Medical clearance from Dr Debara Pickett  Mahitha Hickling A 08/19/2016, 5:34 AM

## 2016-08-19 NOTE — Anesthesia Preprocedure Evaluation (Addendum)
Anesthesia Evaluation  Patient identified by MRN, date of birth, ID band Patient awake    Reviewed: Allergy & Precautions, NPO status , Patient's Chart, lab work & pertinent test results, reviewed documented beta blocker date and time   History of Anesthesia Complications Negative for: history of anesthetic complications  Airway Mallampati: III  TM Distance: >3 FB Neck ROM: Full    Dental  (+) Upper Dentures, Dental Advisory Given   Pulmonary neg shortness of breath, asthma (patient denies) , neg sleep apnea, neg COPD, neg recent URI, Current Smoker,    Pulmonary exam normal breath sounds clear to auscultation       Cardiovascular hypertension, Pt. on medications and Pt. 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 female G3P3 BF PMP presents for dx hysteroscopy, D&C, hysteroscopic resection of endometrial mass noted on sono hysterogram.  HLD  Reproductive/Obstetrics                           Anesthesia Physical Anesthesia Plan  ASA: III  Anesthesia Plan: General   Post-op Pain Management:    Induction: Intravenous  Airway Management Planned: LMA  Additional Equipment:   Intra-op Plan:   Post-operative Plan: Extubation in OR  Informed Consent: I have reviewed the patients History and Physical, chart, labs and discussed the procedure including the risks, benefits and alternatives for the proposed  anesthesia with the patient or authorized representative who has indicated his/her understanding and acceptance.   Dental advisory given  Plan Discussed with:   Anesthesia Plan Comments: (Risks of general anesthesia discussed including, but not limited to, sore throat, hoarse voice, chipped/damaged teeth, injury to vocal cords, nausea and vomiting, allergic reactions, lung infection, heart attack, stroke, and death. All questions answered. )       Anesthesia Quick Evaluation

## 2016-08-19 NOTE — Transfer of Care (Signed)
Immediate Anesthesia Transfer of Care Note  Patient: Meghan Lynn  Procedure(s) Performed: Procedure(s): DILATATION & CURETTAGE/HYSTEROSCOPY WITH MYOSURE (N/A)  Patient Location: PACU  Anesthesia Type:General  Level of Consciousness: awake, alert , oriented and patient cooperative  Airway & Oxygen Therapy: Patient Spontanous Breathing and Patient connected to nasal cannula oxygen  Post-op Assessment: Report given to RN and Post -op Vital signs reviewed and stable  Post vital signs: Reviewed and stable  Last Vitals:  Vitals:   08/19/16 1411 08/19/16 1415  BP: (!) 170/100 (!) 167/96  Pulse: 88 75  Resp: (!) 21 (!) 21  Temp: 36.9 C     Last Pain:  Vitals:   08/19/16 1153  TempSrc: Oral      Patients Stated Pain Goal: 3 ([id number])  Complications: No apparent anesthesia complications

## 2016-08-19 NOTE — Anesthesia Postprocedure Evaluation (Signed)
Anesthesia Post Note  Patient: Norval Gable  Procedure(s) Performed: Procedure(s) (LRB): DILATATION & CURETTAGE/HYSTEROSCOPY WITH MYOSURE (N/A)  Patient location during evaluation: PACU Anesthesia Type: General Level of consciousness: awake and alert Pain management: pain level controlled Vital Signs Assessment: post-procedure vital signs reviewed and stable Respiratory status: spontaneous breathing, nonlabored ventilation and respiratory function stable Cardiovascular status: blood pressure returned to baseline and stable Postop Assessment: no signs of nausea or vomiting Anesthetic complications: no     Last Vitals:  Vitals:   08/19/16 1500 08/19/16 1530  BP: (!) 157/101 (!) 169/79  Pulse: 77 70  Resp: (!) 21 20  Temp: 36.8 C 36.7 C    Last Pain:  Vitals:   08/19/16 1153  TempSrc: Oral   Pain Goal: Patients Stated Pain Goal: 3 (08/19/16 1530)               Nilda Simmer

## 2016-08-19 NOTE — Discharge Instructions (Signed)
CALL  IF TEMP>100.4, NOTHING PER VAGINA X 1 WK, CALL IF SOAKING A MAXI  PAD EVERY HOUR OR MORE FREQUENTLY  DISCHARGE INSTRUCTIONS: HYSTEROSCOPY  The following instructions have been prepared to help you care for yourself upon your return home.  May take stool softner while taking narcotic pain medication to prevent constipation.  Drink plenty of water.  Personal hygiene:  Use sanitary pads for vaginal drainage, not tampons.  Shower the day after your procedure.  NO tub baths, pools or Jacuzzis for 2-3 weeks.  Wipe front to back after using the bathroom.  Activity and limitations:  Do NOT drive or operate any equipment for 24 hours. The effects of anesthesia are still present and drowsiness may result.  Do NOT rest in bed all day.  Walking is encouraged.  Walk up and down stairs slowly.  You may resume your normal activity in one to two days or as indicated by your physician. Sexual activity: NO intercourse for at least 2 weeks after the procedure, or as indicated by your Doctor.  Diet: Eat a light meal as desired this evening. You may resume your usual diet tomorrow.  Return to Work: You may resume your work activities in one to two days or as indicated by Marine scientist.  What to expect after your surgery: Expect to have vaginal bleeding/discharge for 2-3 days and spotting for up to 10 days. It is not unusual to have soreness for up to 1-2 weeks. You may have a slight burning sensation when you urinate for the first day. Mild cramps may continue for a couple of days. You may have a regular period in 2-6 weeks.  Call your doctor for any of the following:  Excessive vaginal bleeding or clotting, saturating and changing one pad every hour.  Inability to urinate 6 hours after discharge from hospital.  Pain not relieved by pain medication.  Fever of 100.4 F or greater.  Unusual vaginal discharge or odor.  Return to office _________________Call for an appointment  ___________________ Patients signature: ______________________ Nurses signature ________________________  [facility] [phone]

## 2016-08-19 NOTE — Brief Op Note (Signed)
08/19/2016  2:08 PM  PATIENT:  Meghan Lynn  65 y.o. female  PRE-OPERATIVE DIAGNOSIS:  Postmenopausal Bleeding, Endometrial Mass  POST-OPERATIVE DIAGNOSIS:  Postmenopausal Bleeding, Endometrial Mass  PROCEDURE:  Diagnostic hysteroscopy, hysteroscopic resection of endometrial masses using myosure, dilation and curettage  SURGEON:  Surgeon(s) and Role:    * Servando Salina, MD - Primary  PHYSICIAN ASSISTANT:   ASSISTANTS: none   ANESTHESIA:   general FINDINGS: LARGE endom mass arising from ant fundal, left post raised lesion, small uterine fibroids( SM).  Tubal ostia seen EBL:  Total I/O In: 1400 [I.V.:1400] Out: 75 [Blood:75]  BLOOD ADMINISTERED:none  DRAINS: none   LOCAL MEDICATIONS USED:  NONE  SPECIMEN:  Source of Specimen:  endometrial masses, EMC  DISPOSITION OF SPECIMEN:  Source of Specimen:  endometrial masses, EMC  COUNTS:  YES  TOURNIQUET:  * No tourniquets in log *  DICTATION: .Other Dictation: Dictation Number [id number]  PLAN OF CARE: Discharge to home after PACU  PATIENT DISPOSITION:  PACU - hemodynamically stable.   Delay start of Pharmacological VTE agent (>24hrs) due to surgical blood loss or risk of bleeding: no

## 2016-08-19 NOTE — Anesthesia Procedure Notes (Signed)
Procedure Name: LMA Insertion Date/Time: 08/19/2016 1:19 PM Performed by: Georgeanne Nim Pre-anesthesia Checklist: Patient identified, Emergency Drugs available, Suction available, Patient being monitored and Timeout performed Patient Re-evaluated:Patient Re-evaluated prior to inductionOxygen Delivery Method: Circle system utilized Preoxygenation: Pre-oxygenation with 100% oxygen Intubation Type: IV induction LMA: LMA inserted LMA Size: 4.0 Number of attempts: 1 Placement Confirmation: positive ETCO2,  CO2 detector and breath sounds checked- equal and bilateral Tube secured with: Tape Dental Injury: Teeth and Oropharynx as per pre-operative assessment

## 2016-08-20 NOTE — Op Note (Signed)
Meghan Lynn, Meghan Lynn               ACCOUNT NO.:  [id number]  MEDICAL RECORD NO.:  [id number]  LOCATION:  [facility]                          FACILITY:  [facility]  PHYSICIAN:  Servando Salina, M.D.DATE OF BIRTH:  1951/06/24  DATE OF PROCEDURE:  08/19/2016 DATE OF DISCHARGE:  08/19/2016                              OPERATIVE REPORT   PREOPERATIVE DIAGNOSES:  Postmenopausal bleeding, endometrial mass.  PROCEDURE:  Diagnostic hysteroscopy, hysteroscopic resection of a large endometrial mass, dilation and curettage.  POSTOPERATIVE DIAGNOSES:  Postmenopausal bleeding, endometrial masses.  ANESTHESIA:  General.  SURGEON:  Servando Salina, MD.  ASSISTANT:  None.  DESCRIPTION OF PROCEDURE:  Under adequate general anesthesia, the patient was placed in the dorsal lithotomy position.  She was sterilely prepped and draped in usual fashion.  The bladder was catheterized with mild amount of urine.  Examination under anesthesia reveals anteverted uterus.  No adnexal masses could be appreciated.  A bivalve speculum was placed in vagina.  A single-tooth tenaculum was placed on anterior lip of the cervix.  The cervix was then dilated up to [facility] dilator.  A MyoSure hysteroscope was introduced into the uterine cavity.  One was unable to see the full length of the mass which was arising from the fundal region due to its presence at the lower uterine segment.  The mass itself had multiple vessels on the surface.  Using the Reach myosure resectoscope, the mass was resected.  After it was resected, a second smaller mass was noted on the posterior left fundal area.  Both tubal ostia could be seen. Several small submucosal fibroids were noted on the lower uterine segment area.  All of them were resected. The resectoscope was then removed.  The cavity was gently curetted for scant tissue.  All instruments were then subsequently removed from the vagina.  Estimated blood loss was minimal.  Specimen labelled  endometrial mass and endometrial currettings were sent to pathology.  Complication was none.  Fluid deficit was about 1800 mL.  Sponge and instrument counts x2 was correct.  The patient tolerated the procedure well, was transferred to recovery room in stable condition.     Servando Salina, M.D.     [place]/MEDQ  D:  08/19/2016  T:  08/20/2016  Job:  [id number]

## 2016-08-24 ENCOUNTER — Encounter (HOSPITAL_COMMUNITY): Payer: Self-pay | Admitting: Obstetrics and Gynecology

## 2016-08-25 ENCOUNTER — Ambulatory Visit
Admission: RE | Admit: 2016-08-25 | Discharge: 2016-08-25 | Disposition: A | Discharge status: Home / Self Care | Payer: Medicare Other | Source: Ambulatory Visit | Attending: Obstetrics and Gynecology | Admitting: Obstetrics and Gynecology

## 2016-08-25 DIAGNOSIS — N644 Mastodynia: Secondary | ICD-10-CM

## 2016-09-29 ENCOUNTER — Encounter: Payer: Self-pay | Admitting: Internal Medicine

## 2016-09-29 ENCOUNTER — Ambulatory Visit (INDEPENDENT_AMBULATORY_CARE_PROVIDER_SITE_OTHER): Payer: Medicare Other | Admitting: Internal Medicine

## 2016-09-29 VITALS — BP 130/98 | HR 66 | Ht 60.0 in | Wt 141.0 lb

## 2016-09-29 DIAGNOSIS — E785 Hyperlipidemia, unspecified: Secondary | ICD-10-CM

## 2016-09-29 DIAGNOSIS — I10 Essential (primary) hypertension: Secondary | ICD-10-CM | POA: Diagnosis not present

## 2016-09-29 DIAGNOSIS — I2510 Atherosclerotic heart disease of native coronary artery without angina pectoris: Secondary | ICD-10-CM | POA: Diagnosis not present

## 2016-09-29 DIAGNOSIS — R0602 Shortness of breath: Secondary | ICD-10-CM

## 2016-09-29 DIAGNOSIS — Z720 Tobacco use: Secondary | ICD-10-CM | POA: Diagnosis not present

## 2016-09-29 DIAGNOSIS — I2583 Coronary atherosclerosis due to lipid rich plaque: Secondary | ICD-10-CM

## 2016-09-29 MED ORDER — VALSARTAN-HYDROCHLOROTHIAZIDE 320-25 MG PO TABS: 1.0000 | ORAL_TABLET | Freq: Every day | ORAL | 3 refills | Status: DC

## 2016-09-29 NOTE — Patient Instructions (Addendum)
Medication Instructions:  START Valsartan HCTZ 320/25 take 1 tab by mouth once a day STOP plain Valsartan   Labwork: Your physician recommends that you return for lab work in: 1 week FASTING -CMET AND LIPIDS  Testing/Procedures: Your physician has requested that you have en exercise stress myoview. For further information please visit [email or web address]. Please follow instruction sheet, as given.  Follow-Up: Your physician recommends that you schedule a follow-up appointment in: AFTER EXERCISE MYOVIEW  Any Other Special Instructions Will Be Listed Below (If Applicable). New RX sent to pharmacy  If you need a refill on your cardiac medications before your next appointment, please call your pharmacy.  NEXT APPT  DATE:_______________________________________  TIME:________________________________AM/PM

## 2016-09-29 NOTE — Progress Notes (Signed)
OFFICE NOTE  Chief Complaint:  Routine follow-up   Primary Care Physician: Pcp Not In System  HPI:  Meghan Lynn  is a 65 year old female with a history of coronary artery disease status post stenting of the right coronary with 2 Cypher stents and a Taxus stent to the proximal right coronary in 2004. She had a negative stress test in 2007 and otherwise has done pretty well, has been asymptomatic. She has had problems with ongoing tobacco abuse and continues to smoke, however, she says this has been a particularly difficult year. She reported that her husband died this year as well as her sister and brother-in-law. Given these multiple problems she has had a difficult time with grieving and has of course resorted back to smoking. Before she has tried Chantix but has failed that. She is doing some activity, including walking on a treadmill about 3 times a week. Her smoking is less than 1 pack per day, denies any alcohol or street drugs.    She reports since I last saw her that she underwent back surgery and has reported a marked improvement in her back pain. She is continuing to have some difficulty since her husband died and has started smoking again. I have provided her with Wellbutrin however she ran out of that but says that it was very helpful for her.  I saw Meghan Lynn back in the office today. Overall she is doing fairly well. Unfortunate she's had 3 Desyrel with the past year and has battled some depression. She ran out of her Wellbutrin and has started smoking again. She wishes to get back off the cigarettes and restart Wellbutrin. From a cardiac standpoint she denies any chest pain or worsening shortness of breath. She's only taking aspirin once a week due to some stomach upset. She's also concerned about the cost of valsartan and due to being on Medicare.  09/29/2016  Meghan Lynn returns today for follow-up. Recently she's been describing some more shortness of breath,  particularly with exertion. She is not exercising as much but does mow her lawn and do trimming and a number of physical activities for which she has to stop more frequently to catch her breath. She does not report any chest tightness with this but is fatigued reports her legs get weak somewhat and feel like they're going to give out on her. She has continued to smoke unfortunately. She does have a coronary artery disease history with PCI in 2007.  PMHx:  Past Medical History:  Diagnosis Date  . Active smoker   . Anginal pain ([place])   . Arthritis   . Asthma    patient denies  . Bronchitis due to tobacco use ([place])   . Burning sensation of feet   . Coronary artery disease    3 stents to RCA  . Dilatation of esophagus   . Gastritis   . GERD (gastroesophageal reflux disease)   . 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 Headache, migraine    daily  . Hyperlipidemia   . Hypertension   . Peptic ulcer disease   . Pneumonia   . Shortness of breath dyspnea    with exertion    Past Surgical History:  Procedure Laterality Date  . BACK SURGERY    . BALLOON DILATION N/A 05/30/2013   Procedure: BALLOON DILATION;  Surgeon: Missy Sabins, MD;  Location: [facility] ENDOSCOPY;  Service: Endoscopy;  Laterality: N/A;  . BALLOON DILATION N/A 08/02/2013  Procedure: BALLOON DILATION;  Surgeon: Missy Sabins, MD;  Location: [facility] ENDOSCOPY;  Service: Endoscopy;  Laterality: N/A;  . BALLOON DILATION N/A 04/29/2016   Procedure: BALLOON DILATION;  Surgeon: Teena Irani, MD;  Location: [facility] ENDOSCOPY;  Service: Endoscopy;  Laterality: N/A;  . CARDIAC CATHETERIZATION  03/07/2002   moderte CAD 80% (mid RCA) - Dr. Myrtice Lauth  . CARDIAC CATHETERIZATION  09/13/2003   Cypher 2.5x67mm and 2.5x18 and Taxus 2.75x64mm to distal, mid, prox RCA (Dr. Gerrie Nordmann)  . CARDIOLITE MYOCARDIAL PERFUSION STUDY  05/2006   positive bruce protocol, low risk, EF 80%  . CARDIOVASCULAR STRESS TEST  2010  . COLONOSCOPY    . CORONARY ANGIOPLASTY   2004   stents x 3  . DILATATION & CURETTAGE/HYSTEROSCOPY WITH MYOSURE N/A 08/19/2016   Procedure: DILATATION & CURETTAGE/HYSTEROSCOPY WITH MYOSURE;  Surgeon: Servando Salina, MD;  Location: [facility];  Service: Gynecology;  Laterality: N/A;  . ESOPHAGOGASTRODUODENOSCOPY N/A 05/30/2013   Procedure: ESOPHAGOGASTRODUODENOSCOPY (EGD);  Surgeon: Missy Sabins, MD;  Location: [facility] ENDOSCOPY;  Service: Endoscopy;  Laterality: N/A;  . ESOPHAGOGASTRODUODENOSCOPY N/A 08/02/2013   Procedure: ESOPHAGOGASTRODUODENOSCOPY (EGD);  Surgeon: Missy Sabins, MD;  Location: [facility] ENDOSCOPY;  Service: Endoscopy;  Laterality: N/A;  barb Greggory [name]  . ESOPHAGOGASTRODUODENOSCOPY (EGD) WITH PROPOFOL N/A 04/29/2016   Procedure: ESOPHAGOGASTRODUODENOSCOPY (EGD) WITH PROPOFOL;  Surgeon: Teena Irani, MD;  Location: [facility] ENDOSCOPY;  Service: Endoscopy;  Laterality: N/A;  . FRACTURE SURGERY    . ORIF ELBOW FRACTURE  2006  . TRANSTHORACIC ECHOCARDIOGRAM  07/2008   normal LV function, mild MR, mild TR, trace pulm valve regurg    FAMHx:  Family History  Problem Relation Age of Onset  . Diabetes Mother   . CAD Mother     CABG  . Bone cancer Father   . Hypertension Father   . Heart disease Brother   . Kidney disease Sister   . Cancer Sister   . Diabetes Sister   . Hyperlipidemia Sister   . Hypertension Sister     SOCHx:   reports that she has been smoking Cigarettes.  She has a 22.50 pack-year smoking history. She has never used smokeless tobacco. She reports that she drinks alcohol. She reports that she does not use drugs.  ALLERGIES:  Allergies  Allergen Reactions  . Propoxyphene Other (See Comments)    Darvocet - severe nausea and vomiting  . Latex Itching    ROS: A comprehensive review of systems was negative except for: Behavioral/Psych: positive for tobacco use  HOME MEDS: Current Outpatient Prescriptions  Medication Sig Dispense Refill  . aspirin EC 81 MG tablet Take 81 mg by mouth daily.     [name] Kitchen atorvastatin (LIPITOR) 20  MG tablet Take 1 tablet (20 mg total) by mouth daily. 90 tablet 3  . metoprolol succinate (TOPROL-XL) 25 MG 24 hr tablet take 1 tablet by mouth once daily (Patient taking differently: take 1 tablet by mouth at bedtime.) 30 tablet 10  . Multiple Vitamin (MULTIVITAMIN WITH MINERALS) TABS Take 1 tablet by mouth daily.    . niacin (NIASPAN) 500 MG CR tablet Take 1 tablet (500 mg total) by mouth at bedtime. 90 tablet 3  . PRESCRIPTION MEDICATION Take 1-2 tablets by mouth at bedtime. Spelled "acitex"    . valsartan-hydrochlorothiazide (DIOVAN-HCT) 320-25 MG tablet Take 1 tablet by mouth daily. 90 tablet 3   No current facility-administered medications for this visit.     LABS/IMAGING: No results found for this or any previous visit (from the  past 48 hour(s)). No results found.  VITALS: BP (!) 130/98   Pulse 66   Ht 5' (1.524 m)   Wt 141 lb (64 kg)   BMI 27.54 kg/m   EXAM: General appearance: alert and no distress Neck: no carotid bruit and no JVD Lungs: clear to auscultation bilaterally Heart: regular rate and rhythm, S1, S2 normal, no murmur, click, rub or gallop Abdomen: soft, non-tender; bowel sounds normal; no masses,  no organomegaly Extremities: extremities normal, atraumatic, no cyanosis or edema Pulses: 2+ and symmetric Skin: Skin color, texture, turgor normal. No rashes or lesions Neurologic: Grossly normal Psych: Normal  EKG: Normal sinus rhythm at 66, inferolateral T-wave changes  ASSESSMENT: 1. CAD status post PCI x3 to the right coronary artery 2007 2. Hypertension 3. Dyslipidemia 4. Tobacco abuse 5. GERD  PLAN: 1.   Meghan Lynn has been reporting increased shortness of breath, particularly with exertion and mowing her lawn. This physical activity limitation is been getting worse recently. She does have a history of coronary disease with remote PCI in 2007. She has ongoing tobacco abuse. I think she is at risk for recurrent coronary artery disease and I like for  her to undergo repeat stress testing. In addition she is noted recent blood pressure increases. Her blood pressure today was 130/98. In general the diastolic pressures have been higher. I like her switch to valsartan and HCTZ 320/25 mg daily. We'll reassess her blood pressure when she returns for her stress test results. The rising blood pressure also could be attributed to coronary disease.  Follow-up after her stress test.  Pixie Casino, MD, [facility] Attending Cardiologist [place] 09/29/2016, 10:20 AM

## 2016-10-06 ENCOUNTER — Telehealth (HOSPITAL_COMMUNITY): Payer: Self-pay

## 2016-10-06 NOTE — Telephone Encounter (Signed)
Encounter complete. 

## 2016-10-08 ENCOUNTER — Ambulatory Visit (HOSPITAL_COMMUNITY)
Admission: RE | Admit: 2016-10-08 | Discharge: 2016-10-08 | Disposition: A | Discharge status: Home / Self Care | Payer: Medicare Other | Source: Ambulatory Visit | Attending: Cardiology | Admitting: Cardiology

## 2016-10-08 DIAGNOSIS — I2583 Coronary atherosclerosis due to lipid rich plaque: Secondary | ICD-10-CM | POA: Diagnosis not present

## 2016-10-08 DIAGNOSIS — I2510 Atherosclerotic heart disease of native coronary artery without angina pectoris: Secondary | ICD-10-CM | POA: Diagnosis not present

## 2016-10-08 DIAGNOSIS — R0602 Shortness of breath: Secondary | ICD-10-CM | POA: Diagnosis not present

## 2016-10-08 DIAGNOSIS — Z720 Tobacco use: Secondary | ICD-10-CM | POA: Diagnosis not present

## 2016-10-08 DIAGNOSIS — I10 Essential (primary) hypertension: Secondary | ICD-10-CM | POA: Diagnosis not present

## 2016-10-08 LAB — MYOCARDIAL PERFUSION IMAGING
CHL CUP NUCLEAR SSS: 6
CHL CUP RESTING HR STRESS: 113 {beats}/min
LVDIAVOL: 42 mL (ref 46–106)
LVSYSVOL: 14 mL
NUC STRESS TID: 1.13
Peak HR: 126 {beats}/min
SDS: 1
SRS: 5

## 2016-10-08 MED ORDER — REGADENOSON 0.4 MG/5ML IV SOLN
0.4000 mg | Freq: Once | INTRAVENOUS | Status: AC
  Administered 2016-10-08: 0.4 mg via INTRAVENOUS

## 2016-10-08 MED ORDER — TECHNETIUM TC 99M TETROFOSMIN IV KIT
10.7000 | PACK | Freq: Once | INTRAVENOUS | Status: AC | PRN
  Administered 2016-10-08: 10.7 via INTRAVENOUS
  Filled 2016-10-08: qty 11

## 2016-10-08 MED ORDER — AMINOPHYLLINE 25 MG/ML IV SOLN
75.0000 mg | Freq: Once | INTRAVENOUS | Status: AC
  Administered 2016-10-08: 75 mg via INTRAVENOUS

## 2016-10-08 MED ORDER — TECHNETIUM TC 99M TETROFOSMIN IV KIT
31.6000 | PACK | Freq: Once | INTRAVENOUS | Status: AC | PRN
  Administered 2016-10-08: 31.6 via INTRAVENOUS
  Filled 2016-10-08: qty 32

## 2016-11-02 DIAGNOSIS — E785 Hyperlipidemia, unspecified: Secondary | ICD-10-CM | POA: Diagnosis not present

## 2016-11-03 ENCOUNTER — Encounter: Payer: Self-pay | Admitting: *Deleted

## 2016-11-03 LAB — LIPID PANEL
Cholesterol: 159 mg/dL (ref <200)
HDL: 40 mg/dL — ABNORMAL LOW (ref >50)
LDL CALC: 76 mg/dL
TRIGLYCERIDES: 214 mg/dL — AB (ref <150)
Total CHOL/HDL Ratio: 4 Ratio (ref <5.0)
VLDL: 43 mg/dL — AB (ref <30)

## 2016-11-03 LAB — COMPREHENSIVE METABOLIC PANEL
ALT: 15 U/L (ref 6–29)
AST: 22 U/L (ref 10–35)
Albumin: 3.9 g/dL (ref 3.6–5.1)
Alkaline Phosphatase: 69 U/L (ref 33–130)
BILIRUBIN TOTAL: 0.3 mg/dL (ref 0.2–1.2)
BUN: 8 mg/dL (ref 7–25)
CHLORIDE: 109 mmol/L (ref 98–110)
CO2: 28 mmol/L (ref 20–31)
CREATININE: 0.97 mg/dL (ref 0.50–0.99)
Calcium: 9.4 mg/dL (ref 8.6–10.4)
GLUCOSE: 91 mg/dL (ref 65–99)
Potassium: 4.6 mmol/L (ref 3.5–5.3)
SODIUM: 144 mmol/L (ref 135–146)
Total Protein: 6.8 g/dL (ref 6.1–8.1)

## 2016-11-08 ENCOUNTER — Other Ambulatory Visit: Payer: Self-pay | Admitting: Internal Medicine

## 2016-11-08 NOTE — Telephone Encounter (Signed)
REFILL 

## 2016-11-15 ENCOUNTER — Encounter: Payer: Self-pay | Admitting: Internal Medicine

## 2016-11-17 ENCOUNTER — Ambulatory Visit (INDEPENDENT_AMBULATORY_CARE_PROVIDER_SITE_OTHER): Payer: Medicare Other | Admitting: Internal Medicine

## 2016-11-17 ENCOUNTER — Encounter: Payer: Self-pay | Admitting: Internal Medicine

## 2016-11-17 ENCOUNTER — Telehealth: Payer: Self-pay | Admitting: Internal Medicine

## 2016-11-17 VITALS — BP 140/88 | HR 86 | Ht 60.0 in | Wt 146.0 lb

## 2016-11-17 DIAGNOSIS — I2583 Coronary atherosclerosis due to lipid rich plaque: Secondary | ICD-10-CM | POA: Diagnosis not present

## 2016-11-17 DIAGNOSIS — E785 Hyperlipidemia, unspecified: Secondary | ICD-10-CM

## 2016-11-17 DIAGNOSIS — I10 Essential (primary) hypertension: Secondary | ICD-10-CM | POA: Diagnosis not present

## 2016-11-17 DIAGNOSIS — I2510 Atherosclerotic heart disease of native coronary artery without angina pectoris: Secondary | ICD-10-CM | POA: Diagnosis not present

## 2016-11-17 DIAGNOSIS — Z720 Tobacco use: Secondary | ICD-10-CM

## 2016-11-17 MED ORDER — VALSARTAN-HYDROCHLOROTHIAZIDE 320-12.5 MG PO TABS
1.0000 | ORAL_TABLET | Freq: Every day | ORAL | 3 refills | Status: DC
Start: 2016-11-17 — End: 2017-03-04

## 2016-11-17 NOTE — Progress Notes (Signed)
OFFICE NOTE  Chief Complaint:  Follow-up stress test   Primary Care Physician: Pcp Not In System  HPI:  Meghan Lynn  is a 65 year old female with a history of coronary artery disease status post stenting of the right coronary with 2 Cypher stents and a Taxus stent to the proximal right coronary in 2004. She had a negative stress test in 2007 and otherwise has done pretty well, has been asymptomatic. She has had problems with ongoing tobacco abuse and continues to smoke, however, she says this has been a particularly difficult year. She reported that her husband died this year as well as her sister and brother-in-law. Given these multiple problems she has had a difficult time with grieving and has of course resorted back to smoking. Before she has tried Chantix but has failed that. She is doing some activity, including walking on a treadmill about 3 times a week. Her smoking is less than 1 pack per day, denies any alcohol or street drugs.    She reports since I last saw her that she underwent back surgery and has reported a marked improvement in her back pain. She is continuing to have some difficulty since her husband died and has started smoking again. I have provided her with Wellbutrin however she ran out of that but says that it was very helpful for her.  I saw Meghan Lynn back in the office today. Overall she is doing fairly well. Unfortunate she's had 3 Desyrel with the past year and has battled some depression. She ran out of her Wellbutrin and has started smoking again. She wishes to get back off the cigarettes and restart Wellbutrin. From a cardiac standpoint she denies any chest pain or worsening shortness of breath. She's only taking aspirin once a week due to some stomach upset. She's also concerned about the cost of valsartan and due to being on Medicare.  09/29/2016  Meghan Lynn returns today for follow-up. Recently she's been describing some more shortness of breath,  particularly with exertion. She is not exercising as much but does mow her lawn and do trimming and a number of physical activities for which she has to stop more frequently to catch her breath. She does not report any chest tightness with this but is fatigued reports her legs get weak somewhat and feel like they're going to give out on her. She has continued to smoke unfortunately. She does have a coronary artery disease history with PCI in 2007.  11/16/2016  Meghan Lynn returns for follow-up today. She underwent a nuclear stress test which was negative for ischemia with an EF of 67%. He's had a marked improvement in her blood pressure today 140/88. In general blood pressure runs between 0000000 systolic, unfortunately she's had some cramping in her legs, particularly at night. This is become somewhat intolerable however she did not contact the office. Her renal profile one week after starting the medication showed slight increase in creatinine 0.97 from 0.84 but within normal limits. Potassium was 4.6.  PMHx:  Past Medical History:  Diagnosis Date  . Active smoker   . Anginal pain ([place])   . Arthritis   . Asthma    patient denies  . Bronchitis due to tobacco use ([place])   . Burning sensation of feet   . Coronary artery disease    3 stents to RCA  . Dilatation of esophagus   . Gastritis   . GERD (gastroesophageal reflux disease)   . Headache(784.0)  cluster every day  . Headache, migraine    daily  . Hyperlipidemia   . Hypertension   . Peptic ulcer disease   . Pneumonia   . Shortness of breath dyspnea    with exertion    Past Surgical History:  Procedure Laterality Date  . BACK SURGERY    . BALLOON DILATION N/A 05/30/2013   Procedure: BALLOON DILATION;  Surgeon: Missy Sabins, MD;  Location: [facility] ENDOSCOPY;  Service: Endoscopy;  Laterality: N/A;  . BALLOON DILATION N/A 08/02/2013   Procedure: BALLOON DILATION;  Surgeon: Missy Sabins, MD;  Location: [place];  Service: Endoscopy;   Laterality: N/A;  . BALLOON DILATION N/A 04/29/2016   Procedure: BALLOON DILATION;  Surgeon: Teena Irani, MD;  Location: [facility] ENDOSCOPY;  Service: Endoscopy;  Laterality: N/A;  . CARDIAC CATHETERIZATION  03/07/2002   moderte CAD 80% (mid RCA) - Dr. Myrtice Lauth  . CARDIAC CATHETERIZATION  09/13/2003   Cypher 2.5x41mm and 2.5x18 and Taxus 2.75x77mm to distal, mid, prox RCA (Dr. Gerrie Nordmann)  . CARDIOLITE MYOCARDIAL PERFUSION STUDY  05/2006   positive bruce protocol, low risk, EF 80%  . CARDIOVASCULAR STRESS TEST  2010  . COLONOSCOPY    . CORONARY ANGIOPLASTY  2004   stents x 3  . DILATATION & CURETTAGE/HYSTEROSCOPY WITH MYOSURE N/A 08/19/2016   Procedure: DILATATION & CURETTAGE/HYSTEROSCOPY WITH MYOSURE;  Surgeon: Servando Salina, MD;  Location: [facility];  Service: Gynecology;  Laterality: N/A;  . ESOPHAGOGASTRODUODENOSCOPY N/A 05/30/2013   Procedure: ESOPHAGOGASTRODUODENOSCOPY (EGD);  Surgeon: Missy Sabins, MD;  Location: [facility] ENDOSCOPY;  Service: Endoscopy;  Laterality: N/A;  . ESOPHAGOGASTRODUODENOSCOPY N/A 08/02/2013   Procedure: ESOPHAGOGASTRODUODENOSCOPY (EGD);  Surgeon: Missy Sabins, MD;  Location: [facility] ENDOSCOPY;  Service: Endoscopy;  Laterality: N/A;  barb Greggory [name]  . ESOPHAGOGASTRODUODENOSCOPY (EGD) WITH PROPOFOL N/A 04/29/2016   Procedure: ESOPHAGOGASTRODUODENOSCOPY (EGD) WITH PROPOFOL;  Surgeon: Teena Irani, MD;  Location: [facility] ENDOSCOPY;  Service: Endoscopy;  Laterality: N/A;  . FRACTURE SURGERY    . ORIF ELBOW FRACTURE  2006  . TRANSTHORACIC ECHOCARDIOGRAM  07/2008   normal LV function, mild MR, mild TR, trace pulm valve regurg    FAMHx:  Family History  Problem Relation Age of Onset  . Diabetes Mother   . CAD Mother     CABG  . Bone cancer Father   . Hypertension Father   . Heart disease Brother   . Kidney disease Sister   . Cancer Sister   . Diabetes Sister   . Hyperlipidemia Sister   . Hypertension Sister     SOCHx:   reports that she has been smoking Cigarettes.  She has a 22.50  pack-year smoking history. She has never used smokeless tobacco. She reports that she drinks alcohol. She reports that she does not use drugs.  ALLERGIES:  Allergies  Allergen Reactions  . Propoxyphene Other (See Comments)    Darvocet - severe nausea and vomiting  . Latex Itching    ROS: Pertinent items noted in HPI and remainder of comprehensive ROS otherwise negative.  HOME MEDS: Current Outpatient Prescriptions  Medication Sig Dispense Refill  . aspirin EC 81 MG tablet Take 81 mg by mouth daily.     [name] Kitchen atorvastatin (LIPITOR) 20 MG tablet take 1 tablet by mouth once daily 90 tablet 0  . metoprolol succinate (TOPROL-XL) 25 MG 24 hr tablet take 1 tablet by mouth once daily (Patient taking differently: take 1 tablet by mouth at bedtime.) 30 tablet 10  . Multiple Vitamin (MULTIVITAMIN WITH  MINERALS) TABS Take 1 tablet by mouth daily.    . niacin (NIASPAN) 500 MG CR tablet Take 1 tablet (500 mg total) by mouth at bedtime. 90 tablet 3  . PRESCRIPTION MEDICATION Take 1-2 tablets by mouth at bedtime. Spelled "acitex"    . valsartan-hydrochlorothiazide (DIOVAN-HCT) 320-12.5 MG tablet Take 1 tablet by mouth daily. 30 tablet 3   No current facility-administered medications for this visit.     LABS/IMAGING: No results found for this or any previous visit (from the past 48 hour(s)). No results found.  VITALS: BP 140/88   Pulse 86   Ht 5' (1.524 m)   Wt 146 lb (66.2 kg)   BMI 28.51 kg/m   EXAM: Deferred  EKG: Deferred  ASSESSMENT: 1. CAD status post PCI x3 to the right coronary artery 2007 2. Hypertension 3. Dyslipidemia 4. Tobacco abuse 5. GERD  PLAN: 1.   Meghan Lynn have better blood pressure control on valsartan and HCTZ, but reports nocturnal cramping. I don't think she'll tolerate the full dose of HCTZ therefore I will decrease it to 12.5 mg. She is to contact us if this is not improved. She may need additional lab work and we may need to consider discontinuing that  and consider starting low-dose amlodipine.  Follow-up in 6 months or sooner as necessary.  Pixie Casino, MD, [facility] Attending Cardiologist [place] C Hilty 11/17/2016, 9:34 AM

## 2016-11-17 NOTE — Telephone Encounter (Signed)
New Message  Pt c/o medication issue:  1. Name of Medication: valsartan  2. How are you currently taking this medication (dosage and times per day)? 320-12.5 mg tablet by mouth daily  3. Are you having a reaction (difficulty breathing--STAT)? No  4. What is your medication issue? Pharmacist voiced having questions regarding this medication.

## 2016-11-17 NOTE — Telephone Encounter (Signed)
Spoke with [facility] pharmacist she was verifying valsartan-hctz rx's received. She will d/c all prev and continue the one she received today 320-12.5mg 

## 2016-11-17 NOTE — Patient Instructions (Addendum)
Medication Instructions:  DECREASE Valsartan to 320/12.5mg  Take 1 tablet once a day    Labwork: None   Testing/Procedures: None   Follow-Up: Your physician wants you to follow-up in: 6 MONTHS WITH DR HILTY. You will receive a reminder letter in the mail two months in advance. If you don't receive a letter, please call our office to schedule the follow-up appointment.  Any Other Special Instructions Will Be Listed Below (If Applicable).     If you need a refill on your cardiac medications before your next appointment, please call your pharmacy.

## 2016-12-23 ENCOUNTER — Telehealth: Payer: Self-pay | Admitting: Internal Medicine

## 2016-12-23 DIAGNOSIS — Z79899 Other long term (current) drug therapy: Secondary | ICD-10-CM

## 2016-12-23 NOTE — Telephone Encounter (Signed)
New Message  Patient just wanted to let Dr. Debara Pickett know that lower dosage of medication change was working. Patient was unsure of  medication name.

## 2016-12-23 NOTE — Telephone Encounter (Signed)
Patient reporting change to her valsartan-HCTZ working well for her. Per last note from her November visit:  PLAN: 1.   Mrs. Corsa have better blood pressure control on valsartan and HCTZ, but reports nocturnal cramping. I don't think she'll tolerate the full dose of HCTZ therefore I will decrease it to 12.5 mg. She is to contact us if this is not improved. She may need additional lab work and we may need to consider discontinuing that and consider starting low-dose amlodipine.   Follow-up in 6 months or sooner as necessary.   ---- Please advise if labwork, anything else recommended at this time.

## 2016-12-30 NOTE — Telephone Encounter (Signed)
Sounds good, agree with the dose reduction .[name] Kitchen Would check BMET and magnesium now to evaluate for diuretic-related cramping.  Dr. Lemmie Evens

## 2016-12-31 NOTE — Telephone Encounter (Signed)
Spoke w patient. She notes improvement of symptoms, agreed to [facility] check to verify. BMET and mag ordered. Aware we will call w results once ready. She voiced thanks for call.

## 2017-01-03 ENCOUNTER — Other Ambulatory Visit: Payer: Self-pay | Admitting: *Deleted

## 2017-01-03 DIAGNOSIS — Z79899 Other long term (current) drug therapy: Secondary | ICD-10-CM

## 2017-01-03 LAB — BASIC METABOLIC PANEL
BUN: 13 mg/dL (ref 7–25)
CO2: 30 mmol/L (ref 20–31)
CREATININE: 0.94 mg/dL (ref 0.50–0.99)
Calcium: 9.5 mg/dL (ref 8.6–10.4)
Chloride: 105 mmol/L (ref 98–110)
Glucose, Bld: 106 mg/dL — ABNORMAL HIGH (ref 65–99)
POTASSIUM: 4 mmol/L (ref 3.5–5.3)
SODIUM: 142 mmol/L (ref 135–146)

## 2017-01-03 LAB — MAGNESIUM: Magnesium: 1.9 mg/dL (ref 1.5–2.5)

## 2017-03-04 ENCOUNTER — Other Ambulatory Visit: Payer: Self-pay | Admitting: Internal Medicine

## 2017-04-29 ENCOUNTER — Ambulatory Visit (HOSPITAL_COMMUNITY)
Admission: EM | Admit: 2017-04-29 | Discharge: 2017-04-29 | Disposition: A | Discharge status: Home / Self Care | Payer: Medicare Other | Attending: Family Medicine | Admitting: Family Medicine

## 2017-04-29 ENCOUNTER — Ambulatory Visit (INDEPENDENT_AMBULATORY_CARE_PROVIDER_SITE_OTHER): Payer: Medicare Other

## 2017-04-29 ENCOUNTER — Encounter (HOSPITAL_COMMUNITY): Payer: Self-pay | Admitting: Emergency Medicine

## 2017-04-29 DIAGNOSIS — W208XXA Other cause of strike by thrown, projected or falling object, initial encounter: Secondary | ICD-10-CM

## 2017-04-29 DIAGNOSIS — S9030XA Contusion of unspecified foot, initial encounter: Secondary | ICD-10-CM

## 2017-04-29 DIAGNOSIS — S99921A Unspecified injury of right foot, initial encounter: Secondary | ICD-10-CM | POA: Diagnosis not present

## 2017-04-29 DIAGNOSIS — M79671 Pain in right foot: Secondary | ICD-10-CM

## 2017-04-29 MED ORDER — NAPROXEN 250 MG PO TABS: 250.0000 mg | ORAL_TABLET | Freq: Two times a day (BID) | ORAL | 0 refills | Status: DC

## 2017-04-29 NOTE — ED Triage Notes (Signed)
The patient presented to the [facility] with a complaint of right foot pain and swelling secondary to dropping a large can on her foot earlier this am.

## 2017-04-29 NOTE — ED Provider Notes (Signed)
CSN: [id number]     Arrival date & time 04/29/17  1551 History   None    Chief Complaint  Patient presents with  . Foot Pain   (Consider location/radiation/quality/duration/timing/severity/associated sxs/prior Treatment) Paitent dropped can on her right foot    The history is provided by the patient.  Foot Pain  This is a new problem. The problem occurs constantly. The problem has not changed since onset.Nothing aggravates the symptoms.    Past Medical History:  Diagnosis Date  . Active smoker   . Anginal pain ([place])   . Arthritis   . Asthma    patient denies  . Bronchitis due to tobacco use ([place])   . Burning sensation of feet   . Coronary artery disease    3 stents to RCA  . Dilatation of esophagus   . Gastritis   . GERD (gastroesophageal reflux disease)   . 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 Headache, migraine    daily  . Hyperlipidemia   . Hypertension   . Peptic ulcer disease   . Pneumonia   . Shortness of breath dyspnea    with exertion   Past Surgical History:  Procedure Laterality Date  . BACK SURGERY    . BALLOON DILATION N/A 05/30/2013   Procedure: BALLOON DILATION;  Surgeon: Missy Sabins, MD;  Location: [facility] ENDOSCOPY;  Service: Endoscopy;  Laterality: N/A;  . BALLOON DILATION N/A 08/02/2013   Procedure: BALLOON DILATION;  Surgeon: Missy Sabins, MD;  Location: [place];  Service: Endoscopy;  Laterality: N/A;  . BALLOON DILATION N/A 04/29/2016   Procedure: BALLOON DILATION;  Surgeon: Teena Irani, MD;  Location: [facility] ENDOSCOPY;  Service: Endoscopy;  Laterality: N/A;  . CARDIAC CATHETERIZATION  03/07/2002   moderte CAD 80% (mid RCA) - Dr. Myrtice Lauth  . CARDIAC CATHETERIZATION  09/13/2003   Cypher 2.5x41mm and 2.5x18 and Taxus 2.75x38mm to distal, mid, prox RCA (Dr. Gerrie Nordmann)  . CARDIOLITE MYOCARDIAL PERFUSION STUDY  05/2006   positive bruce protocol, low risk, EF 80%  . CARDIOVASCULAR STRESS TEST  2010  . COLONOSCOPY    . CORONARY ANGIOPLASTY  2004   stents x 3  . DILATATION & CURETTAGE/HYSTEROSCOPY WITH MYOSURE N/A 08/19/2016   Procedure: DILATATION & CURETTAGE/HYSTEROSCOPY WITH MYOSURE;  Surgeon: Servando Salina, MD;  Location: [facility];  Service: Gynecology;  Laterality: N/A;  . ESOPHAGOGASTRODUODENOSCOPY N/A 05/30/2013   Procedure: ESOPHAGOGASTRODUODENOSCOPY (EGD);  Surgeon: Missy Sabins, MD;  Location: [facility] ENDOSCOPY;  Service: Endoscopy;  Laterality: N/A;  . ESOPHAGOGASTRODUODENOSCOPY N/A 08/02/2013   Procedure: ESOPHAGOGASTRODUODENOSCOPY (EGD);  Surgeon: Missy Sabins, MD;  Location: [facility] ENDOSCOPY;  Service: Endoscopy;  Laterality: N/A;  barb Greggory [name]  . ESOPHAGOGASTRODUODENOSCOPY (EGD) WITH PROPOFOL N/A 04/29/2016   Procedure: ESOPHAGOGASTRODUODENOSCOPY (EGD) WITH PROPOFOL;  Surgeon: Teena Irani, MD;  Location: [facility] ENDOSCOPY;  Service: Endoscopy;  Laterality: N/A;  . FRACTURE SURGERY    . ORIF ELBOW FRACTURE  2006  . TRANSTHORACIC ECHOCARDIOGRAM  07/2008   normal LV function, mild MR, mild TR, trace pulm valve regurg   Family History  Problem Relation Age of Onset  . Diabetes Mother   . CAD Mother     CABG  . Bone cancer Father   . Hypertension Father   . Heart disease Brother   . Kidney disease Sister   . Cancer Sister   . Diabetes Sister   . Hyperlipidemia Sister   . Hypertension Sister    Social History  Substance Use Topics  .  Smoking status: Current Every Day Smoker    Packs/day: 0.50    Years: 45.00    Types: Cigarettes  . Smokeless tobacco: Never Used  . Alcohol use 0.0 oz/week     Comment: occ   OB History    No data available     Review of Systems  Constitutional: Negative.   HENT: Negative.   Eyes: Negative.   Respiratory: Negative.   Cardiovascular: Negative.   Gastrointestinal: Negative.   Endocrine: Negative.   Genitourinary: Negative.   Musculoskeletal: Positive for arthralgias.  Allergic/Immunologic: Negative.   Neurological: Negative.   Psychiatric/Behavioral: Negative.     Allergies  Propoxyphene  and Latex  Home Medications   Prior to Admission medications   Medication Sig Start Date End Date Taking? Authorizing Provider  aspirin EC 81 MG tablet Take 81 mg by mouth daily.     Historical Provider, MD  atorvastatin (LIPITOR) 20 MG tablet take 1 tablet by mouth once daily 11/08/16   Pixie Casino, MD  metoprolol succinate (TOPROL-XL) 25 MG 24 hr tablet take 1 tablet by mouth once daily Patient taking differently: take 1 tablet by mouth at bedtime. 11/03/15   Pixie Casino, MD  Multiple Vitamin (MULTIVITAMIN WITH MINERALS) TABS Take 1 tablet by mouth daily.    Historical Provider, MD  naproxen (NAPROSYN) 250 MG tablet Take 1 tablet (250 mg total) by mouth 2 (two) times daily with a meal. 04/29/17   Lysbeth Penner, FNP  niacin (NIASPAN) 500 MG CR tablet Take 1 tablet (500 mg total) by mouth at bedtime. 11/05/15   Pixie Casino, MD  PRESCRIPTION MEDICATION Take 1-2 tablets by mouth at bedtime. Spelled "acitex"    Historical Provider, MD  valsartan-hydrochlorothiazide (DIOVAN-HCT) 320-12.5 MG tablet take 1 tablet by mouth once daily 03/04/17   Pixie Casino, MD   Meds Ordered and Administered this Visit  Medications - No data to display  BP 127/78 (BP Location: Left Arm)   Pulse 88   Temp 98.6 F (37 C) (Oral)   Resp 18   SpO2 100%  No data found.   Physical Exam  Constitutional: She appears well-developed and well-nourished.  HENT:  Head: Normocephalic and atraumatic.  Eyes: Conjunctivae and EOM are normal. Pupils are equal, round, and reactive to light.  Neck: Normal range of motion. Neck supple.  Cardiovascular: Normal rate, regular rhythm and normal heart sounds.   Pulmonary/Chest: Effort normal and breath sounds normal.  Abdominal: Soft. Bowel sounds are normal.  Musculoskeletal: She exhibits tenderness.  TTP medial right foot w/o swelling or deformity.  Nursing note and vitals reviewed.   Urgent Care Course     Procedures (including critical care time)  Labs  Review Labs Reviewed - No data to display  Imaging Review Dg Foot Complete Right  Result Date: 04/29/2017 CLINICAL DATA:  Swelling and pain after dropping a can on the right foot today. Initial encounter. EXAM: RIGHT FOOT COMPLETE - 3+ VIEW COMPARISON:  None. FINDINGS: There is no evidence of fracture or dislocation. No acute soft tissue finding. Hallux valgus and mild first MTP narrowing and spurring. Plantar heel spur. IMPRESSION: No acute finding. Electronically Signed   By: Monte Fantasia M.D.   On: 04/29/2017 16:28     Visual Acuity Review  Right Eye Distance:   Left Eye Distance:   Bilateral Distance:    Right Eye Near:   Left Eye Near:    Bilateral Near:         MDM  1. Contusion of foot, unspecified laterality, initial encounter    Naprosyn 500mg  one po bid x 10 days #20      Lysbeth Penner, FNP 04/29/17 2138

## 2017-05-31 ENCOUNTER — Other Ambulatory Visit: Payer: Self-pay | Admitting: *Deleted

## 2017-05-31 DIAGNOSIS — M545 Low back pain: Secondary | ICD-10-CM | POA: Diagnosis not present

## 2017-05-31 DIAGNOSIS — Z6826 Body mass index (BMI) 26.0-26.9, adult: Secondary | ICD-10-CM | POA: Diagnosis not present

## 2017-05-31 MED ORDER — VALSARTAN-HYDROCHLOROTHIAZIDE 320-12.5 MG PO TABS: 1.0000 | ORAL_TABLET | Freq: Every day | ORAL | 1 refills | Status: DC

## 2017-05-31 MED ORDER — NIACIN ER (ANTIHYPERLIPIDEMIC) 500 MG PO TBCR: 500.0000 mg | EXTENDED_RELEASE_TABLET | Freq: Every day | ORAL | 1 refills | Status: DC

## 2017-05-31 MED ORDER — METOPROLOL SUCCINATE ER 25 MG PO TB24: 25.0000 mg | ORAL_TABLET | Freq: Every day | ORAL | 1 refills | Status: DC

## 2017-05-31 MED ORDER — ATORVASTATIN CALCIUM 20 MG PO TABS: 20.0000 mg | ORAL_TABLET | Freq: Every day | ORAL | 1 refills | Status: DC

## 2017-06-01 DIAGNOSIS — M545 Low back pain: Secondary | ICD-10-CM | POA: Diagnosis not present

## 2017-06-07 DIAGNOSIS — Z6826 Body mass index (BMI) 26.0-26.9, adult: Secondary | ICD-10-CM | POA: Diagnosis not present

## 2017-06-07 DIAGNOSIS — M545 Low back pain: Secondary | ICD-10-CM | POA: Diagnosis not present

## 2017-06-22 ENCOUNTER — Ambulatory Visit (INDEPENDENT_AMBULATORY_CARE_PROVIDER_SITE_OTHER): Payer: Medicare Other | Admitting: Internal Medicine

## 2017-06-22 ENCOUNTER — Encounter: Payer: Self-pay | Admitting: Internal Medicine

## 2017-06-22 ENCOUNTER — Ambulatory Visit: Payer: Medicare Other | Admitting: Internal Medicine

## 2017-06-22 VITALS — BP 126/82 | HR 73 | Ht 60.0 in | Wt 142.6 lb

## 2017-06-22 DIAGNOSIS — I10 Essential (primary) hypertension: Secondary | ICD-10-CM | POA: Diagnosis not present

## 2017-06-22 DIAGNOSIS — Z136 Encounter for screening for cardiovascular disorders: Secondary | ICD-10-CM

## 2017-06-22 DIAGNOSIS — R29898 Other symptoms and signs involving the musculoskeletal system: Secondary | ICD-10-CM | POA: Diagnosis not present

## 2017-06-22 DIAGNOSIS — I2510 Atherosclerotic heart disease of native coronary artery without angina pectoris: Secondary | ICD-10-CM | POA: Diagnosis not present

## 2017-06-22 DIAGNOSIS — I2583 Coronary atherosclerosis due to lipid rich plaque: Secondary | ICD-10-CM | POA: Diagnosis not present

## 2017-06-22 DIAGNOSIS — F17200 Nicotine dependence, unspecified, uncomplicated: Secondary | ICD-10-CM

## 2017-06-22 NOTE — Progress Notes (Signed)
OFFICE NOTE  Chief Complaint:  Leg heaviness   Primary Care Physician: System, Pcp Not In  HPI:  Meghan Lynn  is a 66 year old female with a history of coronary artery disease status post stenting of the right coronary with 2 Cypher stents and a Taxus stent to the proximal right coronary in 2004. She had a negative stress test in 2007 and otherwise has done pretty well, has been asymptomatic. She has had problems with ongoing tobacco abuse and continues to smoke, however, she says this has been a particularly difficult year. She reported that her husband died this year as well as her sister and brother-in-law. Given these multiple problems she has had a difficult time with grieving and has of course resorted back to smoking. Before she has tried Chantix but has failed that. She is doing some activity, including walking on a treadmill about 3 times a week. Her smoking is less than 1 pack per day, denies any alcohol or street drugs.    She reports since I last saw her that she underwent back surgery and has reported a marked improvement in her back pain. She is continuing to have some difficulty since her husband died and has started smoking again. I have provided her with Wellbutrin however she ran out of that but says that it was very helpful for her.  I saw Meghan Lynn back in the office today. Overall she is doing fairly well. Unfortunate she's had 3 Desyrel with the past year and has battled some depression. She ran out of her Wellbutrin and has started smoking again. She wishes to get back off the cigarettes and restart Wellbutrin. From a cardiac standpoint she denies any chest pain or worsening shortness of breath. She's only taking aspirin once a week due to some stomach upset. She's also concerned about the cost of valsartan and due to being on Medicare.  09/29/2016  Meghan Lynn returns today for follow-up. Recently she's been describing some more shortness of breath, particularly  with exertion. She is not exercising as much but does mow her lawn and do trimming and a number of physical activities for which she has to stop more frequently to catch her breath. She does not report any chest tightness with this but is fatigued reports her legs get weak somewhat and feel like they're going to give out on her. She has continued to smoke unfortunately. She does have a coronary artery disease history with PCI in 2007.  11/16/2016  Meghan Lynn returns for follow-up today. She underwent a nuclear stress test which was negative for ischemia with an EF of 67%. He's had a marked improvement in her blood pressure today 140/88. In general blood pressure runs between 235-361 systolic, unfortunately she's had some cramping in her legs, particularly at night. This is become somewhat intolerable however she did not contact the office. Her renal profile one week after starting the medication showed slight increase in creatinine 0.97 from 0.84 but within normal limits. Potassium was 4.6.  06/22/2017  Meghan Lynn returns today for follow-up. Overall she seems to be doing well although she is reported some leg heaviness. She's had some trouble with this for a while noted recently when trying to push mower lawn she's had some weakness and fatigue in her legs. She seen her neurosurgeon who felt that this was not related to any significant spinal stenosis or other neurologic pathology. She does have a long-standing smoking history. She reports discomfort that starts in around the lower  back and radiates around both hips to the anterior thighs. She says is not burning in quality and is able to walk on a treadmill for a certain period of time before having to stop. She continues to smoke. Blood pressure is well-controlled today.  PMHx:  Past Medical History:  Diagnosis Date  . Active smoker   . Anginal pain ([place])   . Arthritis   . Asthma    patient denies  . Bronchitis due to tobacco use ([place])   . Burning sensation  of feet   . Coronary artery disease    3 stents to RCA  . Dilatation of esophagus   . Gastritis   . GERD (gastroesophageal reflux disease)   . 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 Headache, migraine    daily  . Hyperlipidemia   . Hypertension   . Peptic ulcer disease   . Pneumonia   . Shortness of breath dyspnea    with exertion    Past Surgical History:  Procedure Laterality Date  . BACK SURGERY    . BALLOON DILATION N/A 05/30/2013   Procedure: BALLOON DILATION;  Surgeon: Missy Sabins, MD;  Location: [facility] ENDOSCOPY;  Service: Endoscopy;  Laterality: N/A;  . BALLOON DILATION N/A 08/02/2013   Procedure: BALLOON DILATION;  Surgeon: Missy Sabins, MD;  Location: [place];  Service: Endoscopy;  Laterality: N/A;  . BALLOON DILATION N/A 04/29/2016   Procedure: BALLOON DILATION;  Surgeon: Teena Irani, MD;  Location: [facility] ENDOSCOPY;  Service: Endoscopy;  Laterality: N/A;  . CARDIAC CATHETERIZATION  03/07/2002   moderte CAD 80% (mid RCA) - Dr. Myrtice Lauth  . CARDIAC CATHETERIZATION  09/13/2003   Cypher 2.5x57mm and 2.5x18 and Taxus 2.75x73mm to distal, mid, prox RCA (Dr. Gerrie Nordmann)  . CARDIOLITE MYOCARDIAL PERFUSION STUDY  05/2006   positive bruce protocol, low risk, EF 80%  . CARDIOVASCULAR STRESS TEST  2010  . COLONOSCOPY    . CORONARY ANGIOPLASTY  2004   stents x 3  . DILATATION & CURETTAGE/HYSTEROSCOPY WITH MYOSURE N/A 08/19/2016   Procedure: DILATATION & CURETTAGE/HYSTEROSCOPY WITH MYOSURE;  Surgeon: Servando Salina, MD;  Location: [facility];  Service: Gynecology;  Laterality: N/A;  . ESOPHAGOGASTRODUODENOSCOPY N/A 05/30/2013   Procedure: ESOPHAGOGASTRODUODENOSCOPY (EGD);  Surgeon: Missy Sabins, MD;  Location: [facility] ENDOSCOPY;  Service: Endoscopy;  Laterality: N/A;  . ESOPHAGOGASTRODUODENOSCOPY N/A 08/02/2013   Procedure: ESOPHAGOGASTRODUODENOSCOPY (EGD);  Surgeon: Missy Sabins, MD;  Location: [facility] ENDOSCOPY;  Service: Endoscopy;  Laterality: N/A;  barb Greggory [name]  . ESOPHAGOGASTRODUODENOSCOPY  (EGD) WITH PROPOFOL N/A 04/29/2016   Procedure: ESOPHAGOGASTRODUODENOSCOPY (EGD) WITH PROPOFOL;  Surgeon: Teena Irani, MD;  Location: [facility] ENDOSCOPY;  Service: Endoscopy;  Laterality: N/A;  . FRACTURE SURGERY    . ORIF ELBOW FRACTURE  2006  . TRANSTHORACIC ECHOCARDIOGRAM  07/2008   normal LV function, mild MR, mild TR, trace pulm valve regurg    FAMHx:  Family History  Problem Relation Age of Onset  . Diabetes Mother   . CAD Mother        CABG  . Bone cancer Father   . Hypertension Father   . Heart disease Brother   . Kidney disease Sister   . Cancer Sister   . Diabetes Sister   . Hyperlipidemia Sister   . Hypertension Sister     SOCHx:   reports that she has been smoking Cigarettes.  She has a 22.50 pack-year smoking history. She has never used smokeless tobacco. She reports that she drinks alcohol.  She reports that she does not use drugs.  ALLERGIES:  Allergies  Allergen Reactions  . Propoxyphene Other (See Comments)    Darvocet - severe nausea and vomiting  . Latex Itching    ROS: Pertinent items noted in HPI and remainder of comprehensive ROS otherwise negative.  HOME MEDS: Current Outpatient Prescriptions  Medication Sig Dispense Refill  . aspirin EC 81 MG tablet Take 81 mg by mouth daily.     [name] Kitchen atorvastatin (LIPITOR) 20 MG tablet Take 1 tablet (20 mg total) by mouth daily. 90 tablet 1  . metoprolol succinate (TOPROL-XL) 25 MG 24 hr tablet Take 1 tablet (25 mg total) by mouth daily. 90 tablet 1  . Multiple Vitamin (MULTIVITAMIN WITH MINERALS) TABS Take 1 tablet by mouth daily.    . naproxen (NAPROSYN) 250 MG tablet Take 1 tablet (250 mg total) by mouth 2 (two) times daily with a meal. 14 tablet 0  . niacin (NIASPAN) 500 MG CR tablet Take 1 tablet (500 mg total) by mouth at bedtime. 90 tablet 1  . PRESCRIPTION MEDICATION Take 1-2 tablets by mouth at bedtime. Spelled "acitex"    . valsartan-hydrochlorothiazide (DIOVAN-HCT) 320-12.5 MG tablet Take 1 tablet by mouth daily.  90 tablet 1   No current facility-administered medications for this visit.     LABS/IMAGING: No results found for this or any previous visit (from the past 48 hour(s)). No results found.  VITALS: BP 126/82   Pulse 73   Ht 5' (1.524 m)   Wt 142 lb 9.6 oz (64.7 kg)   BMI 27.85 kg/m   EXAM: General appearance: alert and no distress Neck: no carotid bruit and no JVD Lungs: clear to auscultation bilaterally Heart: regular rate and rhythm, S1, S2 normal, no murmur, click, rub or gallop Abdomen: soft, non-tender; bowel sounds normal; no masses,  no organomegaly Extremities: extremities normal, atraumatic, no cyanosis or edema Pulses: 1+ DP/PT pulses bilaterally Skin: Skin color, texture, turgor normal. No rashes or lesions Neurologic: Grossly normal Psych: Pleasant  EKG: Normal sinus rhythm at 73, nonspecific ST-T wave changes - personally reviewed   ASSESSMENT: 1. Bilateral leg heaviness/fatigue 2. CAD status post PCI x3 to the right coronary artery 2007 3. Hypertension 4. Dyslipidemia 5. Tobacco abuse 6. GERD  PLAN: 1.   Meghan Lynn has good blood pressure control at this point. She denies any coronary symptoms. Unfortunate she continues to smoke and is describing bilateral leg heaviness and fatigue. I like to obtain a screening abdominal aortic ultrasound to rule out aneurysm and lower extremity arterial Dopplers to rule out any arterial insufficiency as a possible cause of her symptoms. She is on aspirin and I would encourage her to continue on that. No changes to her medications today. We'll follow-up in 6 months or sooner as necessary. Should her ultrasound studies be abnormal then I will likely refer her to our peripheral interventionalists.  Pixie Casino, MD, [facility] Attending Cardiologist [place] 06/22/2017, 4:50 PM

## 2017-06-22 NOTE — Patient Instructions (Signed)
Your physician has requested that you have a lower extremity arterial duplex. This test is an ultrasound of the arteries in the legs It looks at arterial blood flow in the legs. Allow one hour for Lower Arterial scans. There are no restrictions or special instructions  Your physician has requested that you have an abdominal aorta duplex. During this test, an ultrasound is used to evaluate the aorta. Allow 30 minutes for this exam. Do not eat after midnight the day before and avoid carbonated beverages  Your physician wants you to follow-up in: 6 months with Dr. Debara Pickett. You will receive a reminder letter in the mail two months in advance. If you don't receive a letter, please call our office to schedule the follow-up appointment.

## 2017-07-01 IMAGING — NM NM MISC PROCEDURE
6 series · 36 of 36 positions shown · non-contrast
Comparison: none

[Series 1: wbr rest · 6.40mm/px · 6 of 64 frames shown]
[frame 6/64]
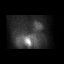
[frame 16/64]
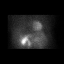
[frame 27/64]
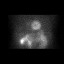
[frame 38/64]
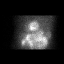
[frame 48/64]
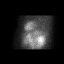
[frame 59/64]
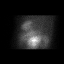

[Series 1: wbr_r-proj_st wbr rest · 6.40mm/px · 6 of 64 frames shown]
[frame 6/64]
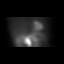
[frame 16/64]
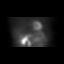
[frame 27/64]
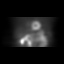
[frame 38/64]
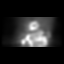
[frame 48/64]
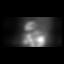
[frame 59/64]
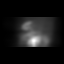

[Series 2: wbr_s-proj_st wbr stress-gsp · 6.40mm/px · 6 of 512 frames shown]
[frame 43/512]
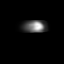
[frame 128/512]
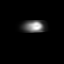
[frame 214/512]
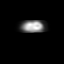
[frame 299/512]
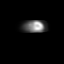
[frame 384/512]
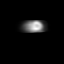
[frame 470/512]
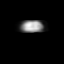

[Series 2: wbr stress-gsp · 6.40mm/px · 6 of 512 frames shown]
[frame 43/512]
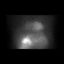
[frame 128/512]
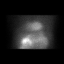
[frame 214/512]
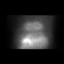
[frame 299/512]
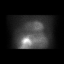
[frame 384/512]
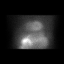
[frame 470/512]
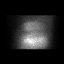

[Series 3: wbr stress-sum-em · 6.40mm/px · 6 of 64 frames shown]
[frame 6/64]
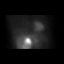
[frame 16/64]
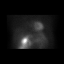
[frame 27/64]
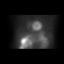
[frame 38/64]
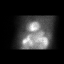
[frame 48/64]
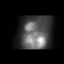
[frame 59/64]
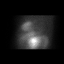

[Series 3: wbr_s-proj_st wbr stress-sum-em · 6.40mm/px · 6 of 64 frames shown]
[frame 6/64]
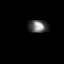
[frame 16/64]
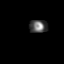
[frame 27/64]
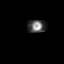
[frame 38/64]
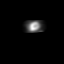
[frame 48/64]
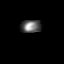
[frame 59/64]
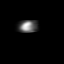

[36 of 36 positions shown; findings below may reference images not displayed]

Canned report from images found in remote index.

Refer to host system for actual result text.

## 2017-07-19 ENCOUNTER — Other Ambulatory Visit: Payer: Self-pay | Admitting: Internal Medicine

## 2017-07-19 DIAGNOSIS — I739 Peripheral vascular disease, unspecified: Secondary | ICD-10-CM

## 2017-07-19 DIAGNOSIS — I10 Essential (primary) hypertension: Secondary | ICD-10-CM

## 2017-07-19 DIAGNOSIS — R29898 Other symptoms and signs involving the musculoskeletal system: Secondary | ICD-10-CM

## 2017-07-19 DIAGNOSIS — F17200 Nicotine dependence, unspecified, uncomplicated: Secondary | ICD-10-CM

## 2017-07-22 ENCOUNTER — Ambulatory Visit (HOSPITAL_COMMUNITY)
Admission: RE | Admit: 2017-07-22 | Discharge: 2017-07-22 | Disposition: A | Discharge status: Home / Self Care | Payer: Medicare Other | Source: Ambulatory Visit | Attending: Cardiovascular Disease | Admitting: Cardiovascular Disease

## 2017-07-22 DIAGNOSIS — I10 Essential (primary) hypertension: Secondary | ICD-10-CM | POA: Diagnosis not present

## 2017-07-22 DIAGNOSIS — I70203 Unspecified atherosclerosis of native arteries of extremities, bilateral legs: Secondary | ICD-10-CM | POA: Diagnosis not present

## 2017-07-22 DIAGNOSIS — Z136 Encounter for screening for cardiovascular disorders: Secondary | ICD-10-CM | POA: Diagnosis not present

## 2017-07-22 DIAGNOSIS — F17200 Nicotine dependence, unspecified, uncomplicated: Secondary | ICD-10-CM | POA: Insufficient documentation

## 2017-07-22 DIAGNOSIS — I739 Peripheral vascular disease, unspecified: Secondary | ICD-10-CM

## 2017-07-22 DIAGNOSIS — R29898 Other symptoms and signs involving the musculoskeletal system: Secondary | ICD-10-CM | POA: Insufficient documentation

## 2017-07-22 DIAGNOSIS — E785 Hyperlipidemia, unspecified: Secondary | ICD-10-CM | POA: Insufficient documentation

## 2017-07-22 DIAGNOSIS — R531 Weakness: Secondary | ICD-10-CM | POA: Diagnosis not present

## 2017-07-22 DIAGNOSIS — F17210 Nicotine dependence, cigarettes, uncomplicated: Secondary | ICD-10-CM | POA: Diagnosis not present

## 2017-07-28 ENCOUNTER — Telehealth: Payer: Self-pay | Admitting: Internal Medicine

## 2017-07-28 MED ORDER — IRBESARTAN-HYDROCHLOROTHIAZIDE 300-12.5 MG PO TABS: 1.0000 | ORAL_TABLET | Freq: Every day | ORAL | 1 refills | Status: DC

## 2017-07-28 NOTE — Telephone Encounter (Signed)
Patient called with LEA results Notes recorded by Sanda Klein, MD on 07/26/2017 at 6:14 PM EDT No evidence of AAA. Mild plaque in leg arteries without serious blockages ________________________ She states she received a notice from pharmacy about her valsartan-hctz 320-12.5mg  being on recall Changed to irbesartan 300-12.5mg  daily Rx(s) sent to pharmacy electronically.

## 2017-09-07 ENCOUNTER — Telehealth: Payer: Self-pay | Admitting: Internal Medicine

## 2017-09-07 MED ORDER — IRBESARTAN-HYDROCHLOROTHIAZIDE 300-12.5 MG PO TABS: 1.0000 | ORAL_TABLET | Freq: Every day | ORAL | 2 refills | Status: DC

## 2017-09-07 NOTE — Telephone Encounter (Signed)
Talked to pharmacist at [place].  Patient used to pay $15 per month for 30 day supply of Valsartan  Now will need to pay $16 per month for 30day supply of Irbesartan or $48 for 90 days.  Rx was sent to [facility] for 30 supply instead of 90 days  Pharmacy will hold the prescription until patient calls to [facility].

## 2017-09-07 NOTE — Telephone Encounter (Signed)
Patient aware of recommendations per pharmacist. Advised patient to call her pharmacy and have them dispense her irbesartan-hctz and only pay for 1 month at a time

## 2017-09-07 NOTE — Telephone Encounter (Signed)
°  New Message ° ° pt verbalized that she is calling for rn  °

## 2017-09-07 NOTE — Telephone Encounter (Signed)
Returned call to patient of Dr. Debara Pickett. She was changed from valsartan-hctz on 8/2 d/t recall to irbesartan-hctz. Her insurance will not completely cover this Rx and she cannot afford the $49/90 day out of pocket and would like a cheaper alternative. She states her insurance told her other options but she did not write them down. She stopped valsartan-hctz when she was notified of the recall and she has been of this med since.   Will route to pharmacy staff for assistance w/med change

## 2017-12-19 DIAGNOSIS — I209 Angina pectoris, unspecified: Secondary | ICD-10-CM | POA: Insufficient documentation

## 2017-12-19 DIAGNOSIS — Z720 Tobacco use: Secondary | ICD-10-CM | POA: Insufficient documentation

## 2017-12-19 DIAGNOSIS — K2289 Other specified disease of esophagus: Secondary | ICD-10-CM | POA: Insufficient documentation

## 2017-12-19 DIAGNOSIS — K2970 Gastritis, unspecified, without bleeding: Secondary | ICD-10-CM | POA: Insufficient documentation

## 2017-12-19 DIAGNOSIS — K228 Other specified diseases of esophagus: Secondary | ICD-10-CM | POA: Insufficient documentation

## 2017-12-19 DIAGNOSIS — K279 Peptic ulcer, site unspecified, unspecified as acute or chronic, without hemorrhage or perforation: Secondary | ICD-10-CM | POA: Insufficient documentation

## 2017-12-19 DIAGNOSIS — E785 Hyperlipidemia, unspecified: Secondary | ICD-10-CM | POA: Insufficient documentation

## 2017-12-19 DIAGNOSIS — G43909 Migraine, unspecified, not intractable, without status migrainosus: Secondary | ICD-10-CM | POA: Insufficient documentation

## 2017-12-19 DIAGNOSIS — I10 Essential (primary) hypertension: Secondary | ICD-10-CM | POA: Insufficient documentation

## 2017-12-19 DIAGNOSIS — J45909 Unspecified asthma, uncomplicated: Secondary | ICD-10-CM | POA: Insufficient documentation

## 2017-12-19 DIAGNOSIS — J189 Pneumonia, unspecified organism: Secondary | ICD-10-CM | POA: Insufficient documentation

## 2017-12-19 DIAGNOSIS — M1990 Unspecified osteoarthritis, unspecified site: Secondary | ICD-10-CM | POA: Insufficient documentation

## 2017-12-19 DIAGNOSIS — J40 Bronchitis, not specified as acute or chronic: Secondary | ICD-10-CM

## 2017-12-19 DIAGNOSIS — R208 Other disturbances of skin sensation: Secondary | ICD-10-CM | POA: Insufficient documentation

## 2017-12-19 DIAGNOSIS — I2510 Atherosclerotic heart disease of native coronary artery without angina pectoris: Secondary | ICD-10-CM | POA: Insufficient documentation

## 2017-12-26 ENCOUNTER — Ambulatory Visit (INDEPENDENT_AMBULATORY_CARE_PROVIDER_SITE_OTHER): Payer: Medicare Other | Admitting: Internal Medicine

## 2017-12-26 ENCOUNTER — Encounter: Payer: Self-pay | Admitting: Internal Medicine

## 2017-12-26 ENCOUNTER — Other Ambulatory Visit: Payer: Self-pay | Admitting: *Deleted

## 2017-12-26 VITALS — BP 156/92 | HR 78 | Ht 60.0 in | Wt 143.6 lb

## 2017-12-26 DIAGNOSIS — I2510 Atherosclerotic heart disease of native coronary artery without angina pectoris: Secondary | ICD-10-CM | POA: Diagnosis not present

## 2017-12-26 DIAGNOSIS — I2583 Coronary atherosclerosis due to lipid rich plaque: Secondary | ICD-10-CM

## 2017-12-26 DIAGNOSIS — E785 Hyperlipidemia, unspecified: Secondary | ICD-10-CM | POA: Diagnosis not present

## 2017-12-26 DIAGNOSIS — I10 Essential (primary) hypertension: Secondary | ICD-10-CM | POA: Diagnosis not present

## 2017-12-26 DIAGNOSIS — F17200 Nicotine dependence, unspecified, uncomplicated: Secondary | ICD-10-CM | POA: Diagnosis not present

## 2017-12-26 MED ORDER — IRBESARTAN 300 MG PO TABS: 300.0000 mg | ORAL_TABLET | Freq: Every day | ORAL | 3 refills | Status: DC

## 2017-12-26 MED ORDER — ATORVASTATIN CALCIUM 20 MG PO TABS: 20.0000 mg | ORAL_TABLET | Freq: Every day | ORAL | 3 refills | Status: DC

## 2017-12-26 MED ORDER — METOPROLOL SUCCINATE ER 25 MG PO TB24: 25.0000 mg | ORAL_TABLET | Freq: Every day | ORAL | 3 refills | Status: DC

## 2017-12-26 NOTE — Progress Notes (Signed)
OFFICE NOTE  Chief Complaint:  Leg cramps   Primary Care Physician: System, Pcp Not In  HPI:  Meghan Lynn  is a 66 year old female with a history of coronary artery disease status post stenting of the right coronary with 2 Cypher stents and a Taxus stent to the proximal right coronary in 2004. She had a negative stress test in 2007 and otherwise has done pretty well, has been asymptomatic. She has had problems with ongoing tobacco abuse and continues to smoke, however, she says this has been a particularly difficult year. She reported that her husband died this year as well as her sister and brother-in-law. Given these multiple problems she has had a difficult time with grieving and has of course resorted back to smoking. Before she has tried Chantix but has failed that. She is doing some activity, including walking on a treadmill about 3 times a week. Her smoking is less than 1 pack per day, denies any alcohol or street drugs.    She reports since I last saw her that she underwent back surgery and has reported a marked improvement in her back pain. She is continuing to have some difficulty since her husband died and has started smoking again. I have provided her with Wellbutrin however she ran out of that but says that it was very helpful for her.  I saw Meghan Lynn back in the office today. Overall she is doing fairly well. Unfortunate she's had 3 Desyrel with the past year and has battled some depression. She ran out of her Wellbutrin and has started smoking again. She wishes to get back off the cigarettes and restart Wellbutrin. From a cardiac standpoint she denies any chest pain or worsening shortness of breath. She's only taking aspirin once a week due to some stomach upset. She's also concerned about the cost of valsartan and due to being on Medicare.  09/29/2016  Meghan Lynn returns today for follow-up. Recently she's been describing some more shortness of breath, particularly  with exertion. She is not exercising as much but does mow her lawn and do trimming and a number of physical activities for which she has to stop more frequently to catch her breath. She does not report any chest tightness with this but is fatigued reports her legs get weak somewhat and feel like they're going to give out on her. She has continued to smoke unfortunately. She does have a coronary artery disease history with PCI in 2007.  11/16/2016  Meghan Lynn returns for follow-up today. She underwent a nuclear stress test which was negative for ischemia with an EF of 67%. He's had a marked improvement in her blood pressure today 140/88. In general blood pressure runs between 259-563 systolic, unfortunately she's had some cramping in her legs, particularly at night. This is become somewhat intolerable however she did not contact the office. Her renal profile one week after starting the medication showed slight increase in creatinine 0.97 from 0.84 but within normal limits. Potassium was 4.6.  06/22/2017  Meghan Lynn returns today for follow-up. Overall she seems to be doing well although she is reported some leg heaviness. She's had some trouble with this for a while noted recently when trying to push mower lawn she's had some weakness and fatigue in her legs. She seen her neurosurgeon who felt that this was not related to any significant spinal stenosis or other neurologic pathology. She does have a long-standing smoking history. She reports discomfort that starts in around the lower  back and radiates around both hips to the anterior thighs. She says is not burning in quality and is able to walk on a treadmill for a certain period of time before having to stop. She continues to smoke. Blood pressure is well-controlled today.  12/26/2017  Meghan Lynn returns today for follow-up.  She reports her legs have improved somewhat.  She is walking more now is back to work.  She recently has been having some problems with  cramping in her legs.  She cut her blood pressure medication in half which is a combination of irbesartan HCTZ.  She says that has improved her cramping, which suggest to me that she is sensitive to the diuretic.  Unfortunately the blood pressures not at goal today.  Is been about a year since she has had her last lipid profile.  PMHx:  Past Medical History:  Diagnosis Date  . Active smoker   . Anginal pain ([place])   . Arthritis   . Asthma    patient denies  . Bronchitis due to tobacco use ([place])   . Burning sensation of feet   . Coronary artery disease    3 stents to RCA  . Dilatation of esophagus   . Gastritis   . GERD (gastroesophageal reflux disease)   . 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 Headache, migraine    daily  . Hyperlipidemia   . Hypertension   . Peptic ulcer disease   . Pneumonia   . Shortness of breath dyspnea    with exertion    Past Surgical History:  Procedure Laterality Date  . BACK SURGERY    . BALLOON DILATION N/A 05/30/2013   Procedure: BALLOON DILATION;  Surgeon: Missy Sabins, MD;  Location: [facility] ENDOSCOPY;  Service: Endoscopy;  Laterality: N/A;  . BALLOON DILATION N/A 08/02/2013   Procedure: BALLOON DILATION;  Surgeon: Missy Sabins, MD;  Location: [place];  Service: Endoscopy;  Laterality: N/A;  . BALLOON DILATION N/A 04/29/2016   Procedure: BALLOON DILATION;  Surgeon: Teena Irani, MD;  Location: [facility] ENDOSCOPY;  Service: Endoscopy;  Laterality: N/A;  . CARDIAC CATHETERIZATION  03/07/2002   moderte CAD 80% (mid RCA) - Dr. Myrtice Lauth  . CARDIAC CATHETERIZATION  09/13/2003   Cypher 2.5x74mm and 2.5x18 and Taxus 2.75x60mm to distal, mid, prox RCA (Dr. Gerrie Nordmann)  . CARDIOLITE MYOCARDIAL PERFUSION STUDY  05/2006   positive bruce protocol, low risk, EF 80%  . CARDIOVASCULAR STRESS TEST  2010  . COLONOSCOPY    . CORONARY ANGIOPLASTY  2004   stents x 3  . DILATATION & CURETTAGE/HYSTEROSCOPY WITH MYOSURE N/A 08/19/2016   Procedure: DILATATION &  CURETTAGE/HYSTEROSCOPY WITH MYOSURE;  Surgeon: Servando Salina, MD;  Location: [facility];  Service: Gynecology;  Laterality: N/A;  . ESOPHAGOGASTRODUODENOSCOPY N/A 05/30/2013   Procedure: ESOPHAGOGASTRODUODENOSCOPY (EGD);  Surgeon: Missy Sabins, MD;  Location: [facility] ENDOSCOPY;  Service: Endoscopy;  Laterality: N/A;  . ESOPHAGOGASTRODUODENOSCOPY N/A 08/02/2013   Procedure: ESOPHAGOGASTRODUODENOSCOPY (EGD);  Surgeon: Missy Sabins, MD;  Location: [facility] ENDOSCOPY;  Service: Endoscopy;  Laterality: N/A;  barb Greggory [name]  . ESOPHAGOGASTRODUODENOSCOPY (EGD) WITH PROPOFOL N/A 04/29/2016   Procedure: ESOPHAGOGASTRODUODENOSCOPY (EGD) WITH PROPOFOL;  Surgeon: Teena Irani, MD;  Location: [facility] ENDOSCOPY;  Service: Endoscopy;  Laterality: N/A;  . FRACTURE SURGERY    . ORIF ELBOW FRACTURE  2006  . TRANSTHORACIC ECHOCARDIOGRAM  07/2008   normal LV function, mild MR, mild TR, trace pulm valve regurg    FAMHx:  Family History  Problem Relation Age of Onset  . Diabetes Mother   . CAD Mother        CABG  . Bone cancer Father   . Hypertension Father   . Heart disease Brother   . Kidney disease Sister   . Cancer Sister   . Diabetes Sister   . Hyperlipidemia Sister   . Hypertension Sister     SOCHx:   reports that she has been smoking cigarettes.  She has a 22.50 pack-year smoking history. she has never used smokeless tobacco. She reports that she drinks alcohol. She reports that she does not use drugs.  ALLERGIES:  Allergies  Allergen Reactions  . Propoxyphene Other (See Comments)    Darvocet - severe nausea and vomiting  . Latex Itching    ROS: Pertinent items noted in HPI and remainder of comprehensive ROS otherwise negative.  HOME MEDS: Current Outpatient Medications  Medication Sig Dispense Refill  . aspirin EC 81 MG tablet Take 81 mg by mouth daily.     [name] Kitchen atorvastatin (LIPITOR) 20 MG tablet Take 1 tablet (20 mg total) by mouth daily. 90 tablet 1  . irbesartan-hydrochlorothiazide (AVALIDE) 300-12.5 MG tablet  Take 1 tablet by mouth daily. 30 tablet 2  . metoprolol succinate (TOPROL-XL) 25 MG 24 hr tablet Take 1 tablet (25 mg total) by mouth daily. 90 tablet 1  . Multiple Vitamin (MULTIVITAMIN WITH MINERALS) TABS Take 1 tablet by mouth daily.    . naproxen (NAPROSYN) 250 MG tablet Take 1 tablet (250 mg total) by mouth 2 (two) times daily with a meal. 14 tablet 0  . niacin (NIASPAN) 500 MG CR tablet Take 1 tablet (500 mg total) by mouth at bedtime. 90 tablet 1  . PRESCRIPTION MEDICATION Take 1-2 tablets by mouth at bedtime. Spelled "acitex"     No current facility-administered medications for this visit.     LABS/IMAGING: No results found for this or any previous visit (from the past 48 hour(s)). No results found.  VITALS: Ht 5' (1.524 m)   Wt 143 lb 9.6 oz (65.1 kg)   BMI 28.04 kg/m   EXAM: General appearance: alert and no distress Neck: no carotid bruit and no JVD Lungs: clear to auscultation bilaterally Heart: regular rate and rhythm, S1, S2 normal, no murmur, click, rub or gallop Abdomen: soft, non-tender; bowel sounds normal; no masses,  no organomegaly Extremities: extremities normal, atraumatic, no cyanosis or edema Pulses: 1+ DP/PT pulses bilaterally Skin: Skin color, texture, turgor normal. No rashes or lesions Neurologic: Grossly normal Psych: Pleasant  EKG: Dermal sinus rhythm at 78, lateral ST and T wave changes-unchanged, personally reviewed  ASSESSMENT: 1. CAD status post PCI x3 to the right coronary artery 2007 2. Hypertension 3. Dyslipidemia 4. Tobacco abuse 5. GERD  PLAN: 1.   Meghan Lynn has had improvement in her leg heaviness.  Last summer I obtained screening abdominal aortic ultrasound her lower extremity arterial Dopplers which were normal.  She started walking more and is now back to work which may be helping.  She is due for repeat lipid profile which we will obtain.  Finally, blood pressure is above goal today.  She has been cutting her irbesartan HCTZ  in half due to leg cramps.  To solve his issue I think we can discontinue HCTZ and continue on irbesartan 300 mg daily.  Follow-up with me in 6 months.  Pixie Casino, MD, [facility], [place] Director of the [facility]  [facility] Attending Cardiologist  Direct Dial: [phone]  Fax: [phone]  Website:  [email or web address] 12/26/2017, 8:01 AM

## 2017-12-26 NOTE — Patient Instructions (Addendum)
Medication Instructions: STOP the irbesartan hydrochlorothiazide START Irbesartan 300 mg daily   If you need a refill on your cardiac medications before your next appointment, please call your pharmacy.   Labwork: Your physician recommends that you have the following: fasting Lipid   Follow-Up: Your physician wants you to follow-up in: 6 months with Dr. Debara Pickett. You will receive a reminder letter in the mail two months in advance. If you don't receive a letter, please call our office at [phone] to schedule this follow-up appointment.   Thank you for choosing [facility]!!

## 2018-01-20 DIAGNOSIS — I10 Essential (primary) hypertension: Secondary | ICD-10-CM | POA: Diagnosis not present

## 2018-01-20 DIAGNOSIS — E785 Hyperlipidemia, unspecified: Secondary | ICD-10-CM | POA: Diagnosis not present

## 2018-01-20 LAB — LIPID PANEL
CHOL/HDL RATIO: 3.7 ratio (ref 0.0–4.4)
Cholesterol, Total: 161 mg/dL (ref 100–199)
HDL: 44 mg/dL (ref >39)
LDL Calculated: 81 mg/dL (ref 0–99)
TRIGLYCERIDES: 180 mg/dL — AB (ref 0–149)
VLDL Cholesterol Cal: 36 mg/dL (ref 5–40)

## 2018-02-22 ENCOUNTER — Telehealth: Payer: Self-pay | Admitting: Internal Medicine

## 2018-02-22 NOTE — Telephone Encounter (Signed)
Pt c/o medication issue: 1. Name of [place] 300mg  2. How are you currently taking this medication (dosage and times per day)?  3. Are you having a reaction (difficulty breathing--STAT)?   4. What is your medication issue?

## 2018-02-22 NOTE — Telephone Encounter (Signed)
Patient called in stating her pharmacy notified her via letter that her irbesartan 300mg  was recalled. She states she is not taking any more medication until confirmed OK by her doctor. Advised would call her [facility] to determine what the issue is.   Per [facility], corporate sent out letters of meds on recall from over a year ago. Olivia at pharmacy checked patient's Rx [place] number and her Rx is not on recall.   Patient called and made aware.

## 2018-02-22 NOTE — Telephone Encounter (Signed)
LMTCB

## 2018-03-08 DIAGNOSIS — M13821 Other specified arthritis, right elbow: Secondary | ICD-10-CM | POA: Diagnosis not present

## 2018-03-08 DIAGNOSIS — M13829 Other specified arthritis, unspecified elbow: Secondary | ICD-10-CM | POA: Diagnosis not present

## 2018-03-08 DIAGNOSIS — M79641 Pain in right hand: Secondary | ICD-10-CM | POA: Diagnosis not present

## 2018-03-08 DIAGNOSIS — M65341 Trigger finger, right ring finger: Secondary | ICD-10-CM | POA: Diagnosis not present

## 2018-07-18 ENCOUNTER — Encounter: Payer: Self-pay | Admitting: Internal Medicine

## 2018-07-18 ENCOUNTER — Ambulatory Visit (INDEPENDENT_AMBULATORY_CARE_PROVIDER_SITE_OTHER): Payer: Medicare Other | Admitting: Internal Medicine

## 2018-07-18 VITALS — BP 154/94 | HR 90 | Ht 61.0 in | Wt 143.2 lb

## 2018-07-18 DIAGNOSIS — R635 Abnormal weight gain: Secondary | ICD-10-CM | POA: Insufficient documentation

## 2018-07-18 DIAGNOSIS — Z1329 Encounter for screening for other suspected endocrine disorder: Secondary | ICD-10-CM | POA: Diagnosis not present

## 2018-07-18 DIAGNOSIS — I10 Essential (primary) hypertension: Secondary | ICD-10-CM

## 2018-07-18 DIAGNOSIS — L659 Nonscarring hair loss, unspecified: Secondary | ICD-10-CM | POA: Insufficient documentation

## 2018-07-18 DIAGNOSIS — E785 Hyperlipidemia, unspecified: Secondary | ICD-10-CM

## 2018-07-18 NOTE — Patient Instructions (Signed)
Your physician recommends that you return for lab work FASTING to check cholesterol and thyroid  Your physician wants you to follow-up in: ONE YEAR with Dr. Debara Pickett. You will receive a reminder letter in the mail two months in advance. If you don't receive a letter, please call our office to schedule the follow-up appointment.

## 2018-07-18 NOTE — Progress Notes (Signed)
OFFICE NOTE  Chief Complaint:  Leg stickiness   Primary Care Physician: System, Pcp Not In  HPI:  Meghan Lynn  is a 67 year old female with a history of coronary artery disease status post stenting of the right coronary with 2 Cypher stents and a Taxus stent to the proximal right coronary in 2004. She had a negative stress test in 2007 and otherwise has done pretty well, has been asymptomatic. She has had problems with ongoing tobacco abuse and continues to smoke, however, she says this has been a particularly difficult year. She reported that her husband died this year as well as her sister and brother-in-law. Given these multiple problems she has had a difficult time with grieving and has of course resorted back to smoking. Before she has tried Chantix but has failed that. She is doing some activity, including walking on a treadmill about 3 times a week. Her smoking is less than 1 pack per day, denies any alcohol or street drugs.    She reports since I last saw her that she underwent back surgery and has reported a marked improvement in her back pain. She is continuing to have some difficulty since her husband died and has started smoking again. I have provided her with Wellbutrin however she ran out of that but says that it was very helpful for her.  I saw Meghan Lynn back in the office today. Overall she is doing fairly well. Unfortunate she's had 3 Desyrel with the past year and has battled some depression. She ran out of her Wellbutrin and has started smoking again. She wishes to get back off the cigarettes and restart Wellbutrin. From a cardiac standpoint she denies any chest pain or worsening shortness of breath. She's only taking aspirin once a week due to some stomach upset. She's also concerned about the cost of valsartan and due to being on Medicare.  09/29/2016  Meghan Lynn returns today for follow-up. Recently she's been describing some more shortness of breath,  particularly with exertion. She is not exercising as much but does mow her lawn and do trimming and a number of physical activities for which she has to stop more frequently to catch her breath. She does not report any chest tightness with this but is fatigued reports her legs get weak somewhat and feel like they're going to give out on her. She has continued to smoke unfortunately. She does have a coronary artery disease history with PCI in 2007.  11/16/2016  Meghan Lynn returns for follow-up today. She underwent a nuclear stress test which was negative for ischemia with an EF of 67%. He's had a marked improvement in her blood pressure today 140/88. In general blood pressure runs between 875-643 systolic, unfortunately she's had some cramping in her legs, particularly at night. This is become somewhat intolerable however she did not contact the office. Her renal profile one week after starting the medication showed slight increase in creatinine 0.97 from 0.84 but within normal limits. Potassium was 4.6.  06/22/2017  Meghan Lynn returns today for follow-up. Overall she seems to be doing well although she is reported some leg heaviness. She's had some trouble with this for a while noted recently when trying to push mower lawn she's had some weakness and fatigue in her legs. She seen her neurosurgeon who felt that this was not related to any significant spinal stenosis or other neurologic pathology. She does have a long-standing smoking history. She reports discomfort that starts in around the lower  back and radiates around both hips to the anterior thighs. She says is not burning in quality and is able to walk on a treadmill for a certain period of time before having to stop. She continues to smoke. Blood pressure is well-controlled today.  12/26/2017  Meghan Lynn returns today for follow-up.  She reports her legs have improved somewhat.  She is walking more now is back to work.  She recently has been having some  problems with cramping in her legs.  She cut her blood pressure medication in half which is a combination of irbesartan HCTZ.  She says that has improved her cramping, which suggest to me that she is sensitive to the diuretic.  Unfortunately the blood pressures not at goal today.  Is been about a year since she has had her last lipid profile.  07/18/2018  Meghan Lynn returns today for follow-up.  She reports leg stickiness.  She denied any worsening leg cramps after stopping her thiazide.  Blood pressure was elevated today 154/94 of her repeat cumulative 701 systolic.  She reports recent hair loss and notably that her hair was very fragile.  Is due for repeat lipid profile.  She denies any chest pain.  PMHx:  Past Medical History:  Diagnosis Date  . Active smoker   . Anginal pain ([place])   . Arthritis   . Asthma    patient denies  . Bronchitis due to tobacco use ([place])   . Burning sensation of feet   . Coronary artery disease    3 stents to RCA  . Dilatation of esophagus   . Gastritis   . GERD (gastroesophageal reflux disease)   . 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 Headache, migraine    daily  . Hyperlipidemia   . Hypertension   . Peptic ulcer disease   . Pneumonia   . Shortness of breath dyspnea    with exertion    Past Surgical History:  Procedure Laterality Date  . BACK SURGERY    . BALLOON DILATION N/A 05/30/2013   Procedure: BALLOON DILATION;  Surgeon: Missy Sabins, MD;  Location: [facility] ENDOSCOPY;  Service: Endoscopy;  Laterality: N/A;  . BALLOON DILATION N/A 08/02/2013   Procedure: BALLOON DILATION;  Surgeon: Missy Sabins, MD;  Location: [facility];  Service: Endoscopy;  Laterality: N/A;  . BALLOON DILATION N/A 04/29/2016   Procedure: BALLOON DILATION;  Surgeon: Teena Irani, MD;  Location: [facility] ENDOSCOPY;  Service: Endoscopy;  Laterality: N/A;  . CARDIAC CATHETERIZATION  03/07/2002   moderte CAD 80% (mid RCA) - Dr. Myrtice Lauth  . CARDIAC CATHETERIZATION  09/13/2003    Cypher 2.5x51mm and 2.5x18 and Taxus 2.75x35mm to distal, mid, prox RCA (Dr. Gerrie Nordmann)  . CARDIOLITE MYOCARDIAL PERFUSION STUDY  05/2006   positive bruce protocol, low risk, EF 80%  . CARDIOVASCULAR STRESS TEST  2010  . COLONOSCOPY    . CORONARY ANGIOPLASTY  2004   stents x 3  . DILATATION & CURETTAGE/HYSTEROSCOPY WITH MYOSURE N/A 08/19/2016   Procedure: DILATATION & CURETTAGE/HYSTEROSCOPY WITH MYOSURE;  Surgeon: Servando Salina, MD;  Location: [facility];  Service: Gynecology;  Laterality: N/A;  . ESOPHAGOGASTRODUODENOSCOPY N/A 05/30/2013   Procedure: ESOPHAGOGASTRODUODENOSCOPY (EGD);  Surgeon: Missy Sabins, MD;  Location: [facility] ENDOSCOPY;  Service: Endoscopy;  Laterality: N/A;  . ESOPHAGOGASTRODUODENOSCOPY N/A 08/02/2013   Procedure: ESOPHAGOGASTRODUODENOSCOPY (EGD);  Surgeon: Missy Sabins, MD;  Location: [facility] ENDOSCOPY;  Service: Endoscopy;  Laterality: N/A;  barb Greggory [name]  . ESOPHAGOGASTRODUODENOSCOPY (EGD) WITH  PROPOFOL N/A 04/29/2016   Procedure: ESOPHAGOGASTRODUODENOSCOPY (EGD) WITH PROPOFOL;  Surgeon: Teena Irani, MD;  Location: [facility] ENDOSCOPY;  Service: Endoscopy;  Laterality: N/A;  . FRACTURE SURGERY    . ORIF ELBOW FRACTURE  2006  . TRANSTHORACIC ECHOCARDIOGRAM  07/2008   normal LV function, mild MR, mild TR, trace pulm valve regurg    FAMHx:  Family History  Problem Relation Age of Onset  . Diabetes Mother   . CAD Mother        CABG  . Bone cancer Father   . Hypertension Father   . Heart disease Brother   . Kidney disease Sister   . Cancer Sister   . Diabetes Sister   . Hyperlipidemia Sister   . Hypertension Sister     SOCHx:   reports that she has been smoking cigarettes.  She has a 22.50 pack-year smoking history. She has never used smokeless tobacco. She reports that she drinks alcohol. She reports that she does not use drugs.  ALLERGIES:  Allergies  Allergen Reactions  . Propoxyphene Other (See Comments)    Darvocet - severe nausea and vomiting  . Latex Itching     ROS: Pertinent items noted in HPI and remainder of comprehensive ROS otherwise negative.  HOME MEDS: Current Outpatient Medications  Medication Sig Dispense Refill  . aspirin EC 81 MG tablet Take 81 mg by mouth daily.     [name] Kitchen atorvastatin (LIPITOR) 20 MG tablet Take 1 tablet (20 mg total) by mouth daily. 90 tablet 3  . irbesartan-hydrochlorothiazide (AVALIDE) 300-12.5 MG tablet irbesartan 300 mg-hydrochlorothiazide 12.5 mg tablet    . metoprolol succinate (TOPROL-XL) 25 MG 24 hr tablet Take 1 tablet (25 mg total) by mouth daily. 90 tablet 3  . Multiple Vitamin (MULTIVITAMIN WITH MINERALS) TABS Take 1 tablet by mouth daily.    . naproxen (NAPROSYN) 250 MG tablet Take 1 tablet (250 mg total) by mouth 2 (two) times daily with a meal. 14 tablet 0  . niacin (NIASPAN) 500 MG CR tablet Take 1 tablet (500 mg total) by mouth at bedtime. 90 tablet 1  . PRESCRIPTION MEDICATION Take 1-2 tablets by mouth at bedtime. Spelled "acitex"     No current facility-administered medications for this visit.     LABS/IMAGING: No results found for this or any previous visit (from the past 48 hour(s)). No results found.  VITALS: BP (!) 154/94   Pulse 90   Ht 5\' 1"  (1.549 m)   Wt 143 lb 3.2 oz (65 kg)   BMI 27.06 kg/m   EXAM: General appearance: alert and no distress Neck: no carotid bruit and no JVD Lungs: clear to auscultation bilaterally Heart: regular rate and rhythm, S1, S2 normal, no murmur, click, rub or gallop Abdomen: soft, non-tender; bowel sounds normal; no masses,  no organomegaly Extremities: extremities normal, atraumatic, no cyanosis or edema Pulses: 1+ DP/PT pulses bilaterally Skin: Skin color, texture, turgor normal. No rashes or lesions Neurologic: Grossly normal Psych: Pleasant  EKG: Normal sinus rhythm, lateral T wave changes and 90-personally reviewed, unchanged from prior EKG  ASSESSMENT: 1. CAD status post PCI x3 to the right coronary artery  2007 2. Hypertension 3. Dyslipidemia 4. Tobacco abuse 5. GERD 6. Weight gain, hair loss  PLAN:  1.   Meghan Lynn seems to be controlled with regards to blood pressure.  She has complaints about weight gain and hair loss.  Notably her hair is fragile and is breaking.  Suspect this could be due to hypothyroidism or vitamin  deficiency.  I recommend checking TSH and free T4.  We will also check a fasting lipid profile.  No changes to her medicines today.  Follow-up with me annually or sooner as necessary.  Pixie Casino, MD, [facility], [place] Director of the [facility]  [facility] Attending Cardiologist  Direct Dial: [phone]  Fax: [phone]  Website:  [email or web address] 07/18/2018, 4:06 PM

## 2018-08-10 DIAGNOSIS — E785 Hyperlipidemia, unspecified: Secondary | ICD-10-CM | POA: Diagnosis not present

## 2018-08-10 DIAGNOSIS — L659 Nonscarring hair loss, unspecified: Secondary | ICD-10-CM | POA: Diagnosis not present

## 2018-08-10 DIAGNOSIS — R635 Abnormal weight gain: Secondary | ICD-10-CM | POA: Diagnosis not present

## 2018-08-10 DIAGNOSIS — Z1329 Encounter for screening for other suspected endocrine disorder: Secondary | ICD-10-CM | POA: Diagnosis not present

## 2018-08-11 LAB — LIPID PANEL
CHOL/HDL RATIO: 3.3 ratio (ref 0.0–4.4)
Cholesterol, Total: 145 mg/dL (ref 100–199)
HDL: 44 mg/dL (ref >39)
LDL Calculated: 72 mg/dL (ref 0–99)
Triglycerides: 147 mg/dL (ref 0–149)
VLDL Cholesterol Cal: 29 mg/dL (ref 5–40)

## 2018-08-11 LAB — T4, FREE: FREE T4: 1.02 ng/dL (ref 0.82–1.77)

## 2018-08-11 LAB — TSH: TSH: 0.731 u[IU]/mL (ref 0.450–4.500)

## 2018-09-14 ENCOUNTER — Telehealth: Payer: Self-pay | Admitting: Internal Medicine

## 2018-09-14 MED ORDER — VALSARTAN 320 MG PO TABS: 320.0000 mg | ORAL_TABLET | Freq: Every day | ORAL | 1 refills | Status: DC

## 2018-09-14 NOTE — Telephone Encounter (Signed)
° ° °  Pt c/o medication issue:  1. Name of Medication: irbesartan (AVAPRO) 300 MG tablet  2. How are you currently taking this medication (dosage and times per day)? n/a  3. Are you having a reaction (difficulty breathing--STAT)? no  4. What is your medication issue? Medication on back order, patient has not had medication in months. Please advise

## 2018-09-14 NOTE — Telephone Encounter (Signed)
Pt sts that Irbesartan 300mg  is on back order with her pharmacy.  She was told that the have been trying to reach our office with no response. Adv her that I do not see any documentation of that.  Adv pt that I will fwd a message to our Pharm-D to recommend a substitute.  Adv her that we will give her a call back with their recommendation. Pt voiced appreciation and verbalized understanding.

## 2018-09-14 NOTE — Telephone Encounter (Signed)
Pt aware of [facility] Pharm-D recommendation. Recommendation to substitute Irbesartan for Valsartan 320mg  daily. Adv pt that the Rx has been sent to her pharmacy.

## 2018-09-14 NOTE — Telephone Encounter (Signed)
Rx for valsartan 320mg  sent to prefer pharmacy.

## 2018-09-28 DIAGNOSIS — Z23 Encounter for immunization: Secondary | ICD-10-CM | POA: Diagnosis not present

## 2018-11-26 ENCOUNTER — Other Ambulatory Visit: Payer: Self-pay | Admitting: Internal Medicine

## 2019-03-13 ENCOUNTER — Other Ambulatory Visit: Payer: Self-pay | Admitting: Internal Medicine

## 2019-08-28 ENCOUNTER — Ambulatory Visit (INDEPENDENT_AMBULATORY_CARE_PROVIDER_SITE_OTHER): Payer: Medicare HMO | Admitting: Internal Medicine

## 2019-08-28 ENCOUNTER — Encounter: Payer: Self-pay | Admitting: Internal Medicine

## 2019-08-28 ENCOUNTER — Other Ambulatory Visit: Payer: Self-pay

## 2019-08-28 VITALS — BP 142/80 | HR 74 | Ht 61.0 in | Wt 147.0 lb

## 2019-08-28 DIAGNOSIS — I739 Peripheral vascular disease, unspecified: Secondary | ICD-10-CM | POA: Diagnosis not present

## 2019-08-28 DIAGNOSIS — Z79899 Other long term (current) drug therapy: Secondary | ICD-10-CM

## 2019-08-28 DIAGNOSIS — E785 Hyperlipidemia, unspecified: Secondary | ICD-10-CM

## 2019-08-28 DIAGNOSIS — I10 Essential (primary) hypertension: Secondary | ICD-10-CM | POA: Diagnosis not present

## 2019-08-28 LAB — LIPID PANEL
Chol/HDL Ratio: 3.9 ratio (ref 0.0–4.4)
Cholesterol, Total: 161 mg/dL (ref 100–199)
HDL: 41 mg/dL (ref >39)
LDL Chol Calc (NIH): 90 mg/dL (ref 0–99)
Triglycerides: 175 mg/dL — ABNORMAL HIGH (ref 0–149)
VLDL Cholesterol Cal: 30 mg/dL (ref 5–40)

## 2019-08-28 NOTE — Progress Notes (Signed)
OFFICE NOTE  Chief Complaint:  Some leg pain with walking   Primary Care Physician: System, Pcp Not In  HPI:  Meghan Lynn  is a 68 year old female with a history of coronary artery disease status post stenting of the right coronary with 2 Cypher stents and a Taxus stent to the proximal right coronary in 2004. She had a negative stress test in 2007 and otherwise has done pretty well, has been asymptomatic. She has had problems with ongoing tobacco abuse and continues to smoke, however, she says this has been a particularly difficult year. She reported that her husband died this year as well as her sister and brother-in-law. Given these multiple problems she has had a difficult time with grieving and has of course resorted back to smoking. Before she has tried Chantix but has failed that. She is doing some activity, including walking on a treadmill about 3 times a week. Her smoking is less than 1 pack per day, denies any alcohol or street drugs.    She reports since I last saw her that she underwent back surgery and has reported a marked improvement in her back pain. She is continuing to have some difficulty since her husband died and has started smoking again. I have provided her with Wellbutrin however she ran out of that but says that it was very helpful for her.  I saw Meghan Lynn back in the office today. Overall she is doing fairly well. Unfortunate she's had 3 Desyrel with the past year and has battled some depression. She ran out of her Wellbutrin and has started smoking again. She wishes to get back off the cigarettes and restart Wellbutrin. From a cardiac standpoint she denies any chest pain or worsening shortness of breath. She's only taking aspirin once a week due to some stomach upset. She's also concerned about the cost of valsartan and due to being on Medicare.  09/29/2016  Meghan Lynn returns today for follow-up. Recently she's been describing some more shortness of breath,  particularly with exertion. She is not exercising as much but does mow her lawn and do trimming and a number of physical activities for which she has to stop more frequently to catch her breath. She does not report any chest tightness with this but is fatigued reports her legs get weak somewhat and feel like they're going to give out on her. She has continued to smoke unfortunately. She does have a coronary artery disease history with PCI in 2007.  11/16/2016  Meghan Lynn returns for follow-up today. She underwent a nuclear stress test which was negative for ischemia with an EF of 67%. He's had a marked improvement in her blood pressure today 140/88. In general blood pressure runs between 0000000 systolic, unfortunately she's had some cramping in her legs, particularly at night. This is become somewhat intolerable however she did not contact the office. Her renal profile one week after starting the medication showed slight increase in creatinine 0.97 from 0.84 but within normal limits. Potassium was 4.6.  06/22/2017  Meghan Lynn returns today for follow-up. Overall she seems to be doing well although she is reported some leg heaviness. She's had some trouble with this for a while noted recently when trying to push mower lawn she's had some weakness and fatigue in her legs. She seen her neurosurgeon who felt that this was not related to any significant spinal stenosis or other neurologic pathology. She does have a long-standing smoking history. She reports discomfort that starts in  around the lower back and radiates around both hips to the anterior thighs. She says is not burning in quality and is able to walk on a treadmill for a certain period of time before having to stop. She continues to smoke. Blood pressure is well-controlled today.  12/26/2017  Meghan Lynn returns today for follow-up.  She reports her legs have improved somewhat.  She is walking more now is back to work.  She recently has been having some  problems with cramping in her legs.  She cut her blood pressure medication in half which is a combination of irbesartan HCTZ.  She says that has improved her cramping, which suggest to me that she is sensitive to the diuretic.  Unfortunately the blood pressures not at goal today.  Is been about a year since she has had her last lipid profile.  07/18/2018  Meghan Lynn returns today for follow-up.  She reports leg stickiness.  She denied any worsening leg cramps after stopping her thiazide.  Blood pressure was elevated today 154/94 of her repeat cumulative [id number] systolic.  She reports recent hair loss and notably that her hair was very fragile.  Is due for repeat lipid profile.  She denies any chest pain.  08/28/2019  Meghan Lynn is seen today in follow-up.  She says she still having some leg discomfort when walking on the treadmill.  This is proximal primarily in both legs and in the hip areas.  It is worse when she is walking up or down inclines.  She does have a history of prior lumbar spine surgery.  She had previous lower extremity arterial Dopplers which showed some atherosclerosis but no significant reduction in ABI.  She continues to smoke and is at risk for PAD.  PMHx:  Past Medical History:  Diagnosis Date  . Active smoker   . Anginal pain ([place])   . Arthritis   . Asthma    patient denies  . Bronchitis due to tobacco use   . Burning sensation of feet   . Coronary artery disease    3 stents to RCA  . Dilatation of esophagus   . Gastritis   . GERD (gastroesophageal reflux disease)   . 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 Headache, migraine    daily  . Hyperlipidemia   . Hypertension   . Peptic ulcer disease   . Pneumonia   . Shortness of breath dyspnea    with exertion    Past Surgical History:  Procedure Laterality Date  . BACK SURGERY    . BALLOON DILATION N/A 05/30/2013   Procedure: BALLOON DILATION;  Surgeon: Missy Sabins, MD;  Location: [facility] ENDOSCOPY;  Service:  Endoscopy;  Laterality: N/A;  . BALLOON DILATION N/A 08/02/2013   Procedure: BALLOON DILATION;  Surgeon: Missy Sabins, MD;  Location: [place];  Service: Endoscopy;  Laterality: N/A;  . BALLOON DILATION N/A 04/29/2016   Procedure: BALLOON DILATION;  Surgeon: Teena Irani, MD;  Location: [facility] ENDOSCOPY;  Service: Endoscopy;  Laterality: N/A;  . CARDIAC CATHETERIZATION  03/07/2002   moderte CAD 80% (mid RCA) - Dr. Myrtice Lauth  . CARDIAC CATHETERIZATION  09/13/2003   Cypher 2.5x21mm and 2.5x18 and Taxus 2.75x72mm to distal, mid, prox RCA (Dr. Gerrie Nordmann)  . CARDIOLITE MYOCARDIAL PERFUSION STUDY  05/2006   positive bruce protocol, low risk, EF 80%  . CARDIOVASCULAR STRESS TEST  2010  . COLONOSCOPY    . CORONARY ANGIOPLASTY  2004   stents x 3  .  DILATATION & CURETTAGE/HYSTEROSCOPY WITH MYOSURE N/A 08/19/2016   Procedure: DILATATION & CURETTAGE/HYSTEROSCOPY WITH MYOSURE;  Surgeon: Servando Salina, MD;  Location: [facility];  Service: Gynecology;  Laterality: N/A;  . ESOPHAGOGASTRODUODENOSCOPY N/A 05/30/2013   Procedure: ESOPHAGOGASTRODUODENOSCOPY (EGD);  Surgeon: Missy Sabins, MD;  Location: [facility] END[facility]COPY;  Service: Endoscopy;  Laterality: N/A;  . ESOPHAGOGASTRODUODENOSCOPY N/A 08/02/2013   Procedure: ESOPHAGOGASTRODUODENOSCOPY (EGD);  Surgeon: Missy Sabins, MD;  Location: [facility] ENDOSCOPY;  Service: Endoscopy;  Laterality: N/A;  barb Greggory [name]  . ESOPHAGOGASTRODUODENOSCOPY (EGD) WITH PROPOFOL N/A 04/29/2016   Procedure: ESOPHAGOGASTRODUODENOSCOPY (EGD) WITH PROPOFOL;  Surgeon: Teena Irani, MD;  Location: [facility] ENDOSCOPY;  Service: Endoscopy;  Laterality: N/A;  . FRACTURE SURGERY    . ORIF ELBOW FRACTURE  2006  . TRANSTHORACIC ECHOCARDIOGRAM  07/2008   normal LV function, mild MR, mild TR, trace pulm valve regurg    FAMHx:  Family History  Problem Relation Age of Onset  . Diabetes Mother   . CAD Mother        CABG  . Bone cancer Father   . Hypertension Father   . Heart disease Brother   . Kidney disease Sister    . Cancer Sister   . Diabetes Sister   . Hyperlipidemia Sister   . Hypertension Sister     SOCHx:   reports that she has been smoking cigarettes. She has a 22.50 pack-year smoking history. She has never used smokeless tobacco. She reports current alcohol use. She reports that she does not use drugs.  ALLERGIES:  Allergies  Allergen Reactions  . Propoxyphene Other (See Comments)    Darvocet - severe nausea and vomiting  . Latex Itching    ROS: Pertinent items noted in HPI and remainder of comprehensive ROS otherwise negative.  HOME MEDS: Current Outpatient Medications  Medication Sig Dispense Refill  . aspirin EC 81 MG tablet Take 81 mg by mouth daily.     [name] Kitchen atorvastatin (LIPITOR) 20 MG tablet TAKE 1 TABLET BY MOUTH EVERY DAY 90 tablet 3  . metoprolol succinate (TOPROL-XL) 25 MG 24 hr tablet TAKE 1 TABLET BY MOUTH EVERY DAY 90 tablet 3  . Multiple Vitamin (MULTIVITAMIN WITH MINERALS) TABS Take 1 tablet by mouth daily.    . naproxen (NAPROSYN) 250 MG tablet Take 1 tablet (250 mg total) by mouth 2 (two) times daily with a meal. 14 tablet 0  . niacin (NIASPAN) 500 MG CR tablet Take 1 tablet (500 mg total) by mouth at bedtime. 90 tablet 1  . PRESCRIPTION MEDICATION Take 1-2 tablets by mouth at bedtime. Spelled "acitex"    . valsartan (DIOVAN) 320 MG tablet TAKE 1 TABLET BY MOUTH EVERY DAY 90 tablet 1   No current facility-administered medications for this visit.     LABS/IMAGING: No results found for this or any previous visit (from the past 48 hour(s)). No results found.  VITALS: BP (!) 142/80   Pulse 74   Ht 5\' 1"  (1.549 m)   Wt 147 lb (66.7 kg)   BMI 27.78 kg/m   EXAM: General appearance: alert and no distress Neck: no carotid bruit and no JVD Lungs: clear to auscultation bilaterally Heart: regular rate and rhythm, S1, S2 normal, no murmur, click, rub or gallop Abdomen: soft, non-tender; bowel sounds normal; no masses,  no organomegaly Extremities: extremities  normal, atraumatic, no cyanosis or edema Pulses: 1+ DP/PT pulses bilaterally Skin: Skin color, texture, turgor normal. No rashes or lesions Neurologic: Grossly normal Psych: Pleasant  EKG: Sinus rhythm at 74,  lateral T wave changes-stable, personally reviewed  ASSESSMENT: 1. Bilateral leg pain with walking 2. CAD status post PCI x3 to the right coronary artery 2007 3. Hypertension 4. Dyslipidemia 5. Tobacco abuse 6. GERD 7. Weight gain, hair loss  PLAN:  1.   Meghan Lynn having bilateral proximal thigh pain with walking, worse going up and down hills which could be musculoskeletal and she does have a history of back pain.  She also had some lower extremity arterial atherosclerosis with normal ABIs in the past.  On exam she does have some distal pulses which are palpable.  I would however like to check repeat lower extremity arterial Dopplers.  If this is stable then I would defer her to see her orthopedist.  Blood pressure is a little high today but generally better controlled.  We discussed smoking cessation however she is not quite ready to quit.  Otherwise no changes to her medicines today.  Pixie Casino, MD, [facility], [place] Director of the Advanced Lipid Disorders &  Cardiovascular Risk Reduction Clinic Attending Cardiologist  Direct Dial: [phone]  Fax: [phone]  Website:  [email or web address] 08/28/2019, 9:35 AM

## 2019-08-28 NOTE — Patient Instructions (Signed)
Medication Instructions:  The current medical regimen is effective;  continue present plan and medications as directed. Please refer to the Current Medication list given to you today. If you need a refill on your cardiac medications before your next appointment, please call your pharmacy.  Labwork: FLP HERE IN OUR OFFICE AT [facility]  You will need to fast. DO NOT EAT OR DRINK PAST MIDNIGHT.     You will NOT need to fast   Take the provided lab slips with you to the lab for your blood draw.   When you have your labs (blood work) drawn today and your tests are completely normal, you will receive your results only by MyChart Message (if you have MyChart) -OR-  A paper copy in the mail.  If you have any lab test that is abnormal or we need to change your treatment, we will call you to review these results.  Testing/Procedures: Your physician has requested that you have a lower extremity arterial duplex. During this test, ultrasound are used to evaluate arterial blood flow in the legs. Allow one hour for this exam. There are no restrictions or special instructions.  Follow-Up: You will need a follow up appointment in 12 months.  Please call our office 2 months in advance to schedule this appointment.  You may see Pixie Casino, MD or one of the following Advanced Practice Providers on your designated Care Team: [place], [place] . Fabian Sharp, PA-C     At [facility], you and your health needs are our priority.  As part of our continuing mission to provide you with exceptional heart care, we have created designated Provider Care Teams.  These Care Teams include your primary Cardiologist (physician) and Advanced Practice Providers (APPs -  Physician Assistants and Nurse Practitioners) who all work together to provide you with the care you need, when you need it.  Thank you for choosing [facility] at [facility]!!

## 2019-09-06 ENCOUNTER — Other Ambulatory Visit: Payer: Self-pay

## 2019-09-06 ENCOUNTER — Ambulatory Visit (HOSPITAL_COMMUNITY)
Admission: RE | Admit: 2019-09-06 | Discharge: 2019-09-06 | Disposition: A | Discharge status: Home / Self Care | Payer: Medicare HMO | Source: Ambulatory Visit | Attending: Cardiology | Admitting: Cardiology

## 2019-09-06 DIAGNOSIS — I739 Peripheral vascular disease, unspecified: Secondary | ICD-10-CM | POA: Diagnosis not present

## 2019-09-08 ENCOUNTER — Other Ambulatory Visit: Payer: Self-pay | Admitting: Internal Medicine

## 2019-09-25 ENCOUNTER — Other Ambulatory Visit: Payer: Self-pay

## 2019-09-25 DIAGNOSIS — R6889 Other general symptoms and signs: Secondary | ICD-10-CM | POA: Diagnosis not present

## 2019-09-25 DIAGNOSIS — Z20822 Contact with and (suspected) exposure to covid-19: Secondary | ICD-10-CM

## 2019-09-26 LAB — NOVEL CORONAVIRUS, NAA: SARS-CoV-2, NAA: NOT DETECTED

## 2019-10-02 ENCOUNTER — Other Ambulatory Visit: Payer: Self-pay

## 2019-10-02 DIAGNOSIS — Z20822 Contact with and (suspected) exposure to covid-19: Secondary | ICD-10-CM

## 2019-10-02 DIAGNOSIS — Z20828 Contact with and (suspected) exposure to other viral communicable diseases: Secondary | ICD-10-CM | POA: Diagnosis not present

## 2019-10-04 LAB — NOVEL CORONAVIRUS, NAA: SARS-CoV-2, NAA: NOT DETECTED

## 2019-10-09 ENCOUNTER — Other Ambulatory Visit: Payer: Self-pay

## 2019-10-09 DIAGNOSIS — Z20822 Contact with and (suspected) exposure to covid-19: Secondary | ICD-10-CM

## 2019-10-09 DIAGNOSIS — Z20828 Contact with and (suspected) exposure to other viral communicable diseases: Secondary | ICD-10-CM | POA: Diagnosis not present

## 2019-10-11 LAB — NOVEL CORONAVIRUS, NAA: SARS-CoV-2, NAA: NOT DETECTED

## 2019-10-16 ENCOUNTER — Other Ambulatory Visit: Payer: Self-pay

## 2019-10-16 DIAGNOSIS — Z20822 Contact with and (suspected) exposure to covid-19: Secondary | ICD-10-CM

## 2019-10-17 LAB — NOVEL CORONAVIRUS, NAA: SARS-CoV-2, NAA: NOT DETECTED

## 2019-10-30 ENCOUNTER — Other Ambulatory Visit: Payer: Self-pay

## 2019-10-30 DIAGNOSIS — Z20822 Contact with and (suspected) exposure to covid-19: Secondary | ICD-10-CM

## 2019-10-31 LAB — NOVEL CORONAVIRUS, NAA: SARS-CoV-2, NAA: NOT DETECTED

## 2019-11-06 ENCOUNTER — Other Ambulatory Visit: Payer: Self-pay

## 2019-11-06 DIAGNOSIS — Z20822 Contact with and (suspected) exposure to covid-19: Secondary | ICD-10-CM

## 2019-11-09 LAB — NOVEL CORONAVIRUS, NAA: SARS-CoV-2, NAA: NOT DETECTED

## 2019-12-14 ENCOUNTER — Other Ambulatory Visit: Payer: Self-pay | Admitting: Internal Medicine

## 2019-12-26 ENCOUNTER — Other Ambulatory Visit: Payer: Self-pay

## 2019-12-26 ENCOUNTER — Other Ambulatory Visit (HOSPITAL_COMMUNITY)
Admission: RE | Admit: 2019-12-26 | Discharge: 2019-12-26 | Disposition: A | Discharge status: Home / Self Care | Payer: Medicare HMO | Source: Ambulatory Visit | Attending: Family Medicine | Admitting: Family Medicine

## 2019-12-26 ENCOUNTER — Encounter: Payer: Self-pay | Admitting: Family Medicine

## 2019-12-26 ENCOUNTER — Ambulatory Visit (INDEPENDENT_AMBULATORY_CARE_PROVIDER_SITE_OTHER): Payer: Medicare HMO | Admitting: Family Medicine

## 2019-12-26 VITALS — BP 172/102 | HR 92 | Temp 98.3°F | Ht 60.0 in | Wt 154.0 lb

## 2019-12-26 DIAGNOSIS — I10 Essential (primary) hypertension: Secondary | ICD-10-CM

## 2019-12-26 DIAGNOSIS — N898 Other specified noninflammatory disorders of vagina: Secondary | ICD-10-CM | POA: Diagnosis not present

## 2019-12-26 DIAGNOSIS — R69 Illness, unspecified: Secondary | ICD-10-CM | POA: Diagnosis not present

## 2019-12-26 DIAGNOSIS — Z202 Contact with and (suspected) exposure to infections with a predominantly sexual mode of transmission: Secondary | ICD-10-CM

## 2019-12-26 DIAGNOSIS — Z23 Encounter for immunization: Secondary | ICD-10-CM | POA: Diagnosis not present

## 2019-12-26 LAB — POCT WET + KOH PREP
Trich by wet prep: ABSENT
Yeast by KOH: ABSENT
Yeast by wet prep: ABSENT

## 2019-12-26 MED ORDER — HYDROCHLOROTHIAZIDE 12.5 MG PO TABS: 12.5000 mg | ORAL_TABLET | Freq: Every day | ORAL | 1 refills | Status: DC

## 2019-12-26 NOTE — Progress Notes (Signed)
Patient ID: Meghan Lynn, female    DOB: Aug 19, 1951  Age: 68 y.o. MRN: [id number]  No chief complaint on file.   Subjective:   68 year old lady who is here for the first time.  She complains of having vaginal itching intermittently for the last 5 days or so.  On Friday she did have intercourse, having not had sex for about 6 months.  She is a widow, and the man that she has sex with is always the same person.  As far she knows he is not with anyone else, but she said she cannot do that for sure.  She has not had any odor or discharge.  Has not had these problems in the past.  A few years back she did have some bleeding and had a D&C.  She had gone through menopause at early age.  She works at an assisted living, having retired a few years ago but continuing to work some now because they are short of some positions that she is trying to help them get filled.  She lives alone.  Current allergies, medications, problem list, past/family and social histories reviewed.  Objective:  BP (!) 172/102   Pulse 92   Temp 98.3 F (36.8 C) (Temporal)   Ht 5' (1.524 m)   Wt 154 lb (69.9 kg)   SpO2 97%   BMI 30.08 kg/m   Pleasant lady, alert and oriented.  No acute distress. Normal external genitalia.  Vaginal cuff is unremarkable.  Slight whitish discharge.  Bimanual exam reveals very small uterus.  No masses.  Assessment & Plan:   Assessment: 1. Vagina itching   2. Possible exposure to STD   3. Needs flu shot   4. Essential hypertension       Plan: Wet prep and STD testing.  Orders Placed This Encounter  Procedures  . Flu Vaccine QUAD High Dose(Fluad)  . RPR  . HIV antibody  . POCT Wet + KOH Prep ([facility])    Meds ordered this encounter  Medications  . hydrochlorothiazide (HYDRODIURIL) 12.5 MG tablet    Sig: Take 1 tablet (12.5 mg total) by mouth daily.    Dispense:  90 tablet    Refill:  1         Patient Instructions   Follow-up with your primary care doctor on your  blood pressure in a few weeks.  I began you on hydrochlorothiazide 12.5 mg, which is a mild diuretic blood pressure medication that goes well with the valsartan.  Continue to take both until you see your primary care.  Use some Vagisil as needed for the irritation.  We will let you know the results of your STD testing when available.  Return if problems.    If you have lab work done today you will be contacted with your lab results within the next 2 weeks.  If you have not heard from [place] then please contact us. The fastest way to get your results is to register for My Chart.   IF you received an x-ray today, you will receive an invoice from [facility]. Please contact [facility] at [phone] with questions or concerns regarding your invoice.   IF you received labwork today, you will receive an invoice from [facility]. Please contact [facility] at [phone] with questions or concerns regarding your invoice.   Our billing staff will not be able to assist you with questions regarding bills from these companies.  You will be contacted with the lab results as soon  as they are available. The fastest way to get your results is to activate your My Chart account. Instructions are located on the last page of this paperwork. If you have not heard from [place] regarding the results in 2 weeks, please contact this office.         Return if symptoms worsen or fail to improve.   Ruben Reason, MD 12/26/2019

## 2019-12-26 NOTE — Patient Instructions (Addendum)
Follow-up with your primary care doctor on your blood pressure in a few weeks.  I began you on hydrochlorothiazide 12.5 mg, which is a mild diuretic blood pressure medication that goes well with the valsartan.  Continue to take both until you see your primary care.  Use some Vagisil as needed for the irritation.  We will let you know the results of your STD testing when available.  Return if problems.    If you have lab work done today you will be contacted with your lab results within the next 2 weeks.  If you have not heard from [place] then please contact us. The fastest way to get your results is to register for My Chart.   IF you received an x-ray today, you will receive an invoice from [facility]. Please contact [facility] at [phone] with questions or concerns regarding your invoice.   IF you received labwork today, you will receive an invoice from [place]. Please contact [facility] at [phone] with questions or concerns regarding your invoice.   Our billing staff will not be able to assist you with questions regarding bills from these companies.  You will be contacted with the lab results as soon as they are available. The fastest way to get your results is to activate your My Chart account. Instructions are located on the last page of this paperwork. If you have not heard from [place] regarding the results in 2 weeks, please contact this office.

## 2019-12-27 LAB — RPR: RPR Ser Ql: NONREACTIVE

## 2019-12-27 LAB — GC/CHLAMYDIA PROBE AMP (~~LOC~~) NOT AT ARMC
Chlamydia: NEGATIVE
Comment: NEGATIVE
Comment: NORMAL
Neisseria Gonorrhea: NEGATIVE

## 2019-12-27 LAB — HIV ANTIBODY (ROUTINE TESTING W REFLEX): HIV Screen 4th Generation wRfx: NONREACTIVE

## 2020-01-21 DIAGNOSIS — M25511 Pain in right shoulder: Secondary | ICD-10-CM | POA: Diagnosis not present

## 2020-01-21 DIAGNOSIS — M25531 Pain in right wrist: Secondary | ICD-10-CM | POA: Diagnosis not present

## 2020-01-21 DIAGNOSIS — M79631 Pain in right forearm: Secondary | ICD-10-CM | POA: Diagnosis not present

## 2020-01-31 ENCOUNTER — Other Ambulatory Visit: Payer: Self-pay

## 2020-01-31 ENCOUNTER — Ambulatory Visit (INDEPENDENT_AMBULATORY_CARE_PROVIDER_SITE_OTHER): Payer: Medicare HMO | Admitting: Family Medicine

## 2020-01-31 ENCOUNTER — Encounter: Payer: Self-pay | Admitting: Family Medicine

## 2020-01-31 VITALS — BP 124/76 | HR 91 | Temp 98.4°F | Ht 60.0 in | Wt 153.0 lb

## 2020-01-31 DIAGNOSIS — Z1211 Encounter for screening for malignant neoplasm of colon: Secondary | ICD-10-CM | POA: Diagnosis not present

## 2020-01-31 DIAGNOSIS — Z8719 Personal history of other diseases of the digestive system: Secondary | ICD-10-CM | POA: Insufficient documentation

## 2020-01-31 DIAGNOSIS — I10 Essential (primary) hypertension: Secondary | ICD-10-CM | POA: Diagnosis not present

## 2020-01-31 DIAGNOSIS — E785 Hyperlipidemia, unspecified: Secondary | ICD-10-CM | POA: Diagnosis not present

## 2020-01-31 DIAGNOSIS — Z1231 Encounter for screening mammogram for malignant neoplasm of breast: Secondary | ICD-10-CM | POA: Diagnosis not present

## 2020-01-31 HISTORY — DX: Personal history of other diseases of the digestive system: Z87.19

## 2020-01-31 NOTE — Progress Notes (Signed)
2/4/20219:24 AM  Meghan Lynn [birth year], 69 y.o., female [id number]  Chief Complaint  Patient presents with  . Establish Care    htn, hlp    HPI:   Patient is a 69 y.o. female with past medical history significant for HTN, CAD s/p stents, PAD, HLP, pyloric stenosis and smoker who presents today to establish care  Dr Debara Pickett Cards, yearly, notes reviewed Dr Stanford Breed, will see her in April Dr [facility], has retired, due to for colonoscopy, wants to cont in same practice  Overall doing well Cont to smoke, has tried to quit in the past with chantix and wellbutrin, side effects to both, not interested in quitting at this time Continues to walk daily on the treadmill  Has gotten her first covid vaccine Works as a Research scientist (physical sciences)   Depression screen [facility] 2/9 01/31/2020 12/26/2019 11/28/2015  Decreased Interest 0 0 0  Down, Depressed, Hopeless 0 0 0  PHQ - 2 Score 0 0 0    Fall Risk  01/31/2020 12/26/2019  Falls in the past year? 0 0  Number falls in past yr: 0 0  Injury with Fall? 0 0  Follow up - Falls evaluation completed     Allergies  Allergen Reactions  . Propoxyphene Other (See Comments)    Darvocet - severe nausea and vomiting  . Latex Itching    Prior to Admission medications   Medication Sig Start Date End Date Taking? Authorizing Provider  aspirin EC 81 MG tablet Take 81 mg by mouth daily.    Yes [provider]  atorvastatin (LIPITOR) 20 MG tablet TAKE 1 TABLET BY MOUTH EVERY DAY 12/17/19  Yes Hilty, Nadean Corwin, MD  BIOTIN PO Take 1,000 mg by mouth.   Yes [provider]  COVID-19 mRNA vaccine, Moderna, 100 MCG/0.5ML SUSP Moderna COVID-19 Vaccine (PF) 100 mcg/0.5 mL intramuscular susp. (EUA)  PHARMACY ADMINISTERED   Yes [provider]  hydrochlorothiazide (HYDRODIURIL) 12.5 MG tablet Take 1 tablet (12.5 mg total) by mouth daily. 12/26/19  Yes Posey Boyer, MD  metoprolol succinate (TOPROL-XL) 25 MG 24 hr tablet TAKE 1 TABLET BY  MOUTH EVERY DAY 12/17/19  Yes Hilty, Nadean Corwin, MD  Multiple Vitamin (MULTIVITAMIN WITH MINERALS) TABS Take 1 tablet by mouth daily.   Yes [provider]  omeprazole (PRILOSEC) 20 MG capsule Take 20 mg by mouth daily.   Yes [provider]  valsartan (DIOVAN) 320 MG tablet TAKE 1 TABLET BY MOUTH EVERY DAY 09/10/19  Yes Hilty, Nadean Corwin, MD    Past Medical History:  Diagnosis Date  . Active smoker   . Anginal pain ([place])   . Arthritis   . Asthma    patient denies  . Bronchitis due to tobacco use   . Burning sensation of feet   . Coronary artery disease    3 stents to RCA  . Dilatation of esophagus   . Gastritis   . GERD (gastroesophageal reflux disease)   . 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 Headache, migraine    daily  . Hyperlipidemia   . Hypertension   . Peptic ulcer disease   . Pneumonia   . Shortness of breath dyspnea    with exertion    Past Surgical History:  Procedure Laterality Date  . BACK SURGERY    . BALLOON DILATION N/A 05/30/2013   Procedure: BALLOON DILATION;  Surgeon: Missy Sabins, MD;  Location[facility] ENDOSCOPY;  Service: Endoscopy;  Laterality: N/A;  .  BALLOON DILATION N/A 08/02/2013   Procedure: BALLOON DILATION;  Surgeon: Missy Sabins, MD;  Location: [facility] ENDOSCOPY;  Service: Endoscopy;  Laterality: N/A;  . BALLOON DILATION N/A 04/29/2016   Procedure: BALLOON DILATION;  Surgeon: Teena Irani, MD;  Location: [facility] ENDOSCOPY;  Service: Endoscopy;  Laterality: N/A;  . CARDIAC CATHETERIZATION  03/07/2002   moderte CAD 80% (mid RCA) - Dr. Myrtice Lauth  . CARDIAC CATHETERIZATION  09/13/2003   Cypher 2.5x62m and 2.5x18 and Taxus 2.75x168mto distal, mid, prox RCA (Dr. R.Gerrie Nordmann . CARDIOLITE MYOCARDIAL PERFUSION STUDY  05/2006   positive bruce protocol, low risk, EF 80%  . CARDIOVASCULAR STRESS TEST  2010  . COLONOSCOPY    . CORONARY ANGIOPLASTY  2004   stents x 3  . DILATATION & CURETTAGE/HYSTEROSCOPY WITH MYOSURE N/A 08/19/2016   Procedure:  DILATATION & CURETTAGE/HYSTEROSCOPY WITH MYOSURE;  Surgeon: ShServando SalinaMD;  [facility];  Service: Gynecology;  Laterality: N/A;  . ESOPHAGOGASTRODUODENOSCOPY N/A 05/30/2013   Procedure: ESOPHAGOGASTRODUODENOSCOPY (EGD);  Surgeon: JoMissy SabinsMD;  [facility];  Service: Endoscopy;  Laterality: N/A;  . ESOPHAGOGASTRODUODENOSCOPY N/A 08/02/2013   Procedure: ESOPHAGOGASTRODUODENOSCOPY (EGD);  Surgeon: JoMissy SabinsMD;  [facility];  Service: Endoscopy;  Laterality: N/A;  barb /jGreggory [name]. ESOPHAGOGASTRODUODENOSCOPY (EGD) WITH PROPOFOL N/A 04/29/2016   Procedure: ESOPHAGOGASTRODUODENOSCOPY (EGD) WITH PROPOFOL;  Surgeon: JoTeena IraniMD;  [facility] ENDOSCOPY;  Service: Endoscopy;  Laterality: N/A;  . FRACTURE SURGERY    . ORIF ELBOW FRACTURE  2006  . TRANSTHORACIC ECHOCARDIOGRAM  07/2008   normal LV function, mild MR, mild TR, trace pulm valve regurg    Social History   Tobacco Use  . Smoking status: Current Every Day Smoker    Packs/day: 0.50    Years: 45.00    Pack years: 22.50    Types: Cigarettes  . Smokeless tobacco: Never Used  Substance Use Topics  . Alcohol use: Yes    Alcohol/week: 0.0 standard drinks    Comment: occ    Family History  Problem Relation Age of Onset  . Diabetes Mother   . CAD Mother        CABG  . Bone cancer Father   . Hypertension Father   . Heart disease Brother   . Kidney disease Sister   . Cancer Sister   . Diabetes Sister   . Hyperlipidemia Sister   . Hypertension Sister     Review of Systems  Constitutional: Negative for chills and fever.  Respiratory: Positive for cough. Negative for shortness of breath.   Cardiovascular: Negative for chest pain, palpitations and leg swelling.  Gastrointestinal: Negative for abdominal pain, nausea and vomiting.     OBJECTIVE:  Today's Vitals   01/31/20 0857  BP: 124/76  Pulse: 91  Temp: 98.4 F (36.9 C)  Weight: 153 lb (69.4 kg)  Height: 5' (1.524 m)   Body mass  index is 29.88 kg/m.   Physical Exam Vitals and nursing note reviewed.  Constitutional:      Appearance: She is well-developed.  HENT:     Head: Normocephalic and atraumatic.     Mouth/Throat:     Pharynx: No oropharyngeal exudate.  Eyes:     General: No scleral icterus.    Conjunctiva/sclera: Conjunctivae normal.     Pupils: Pupils are equal, round, and reactive to light.  Cardiovascular:     Rate and Rhythm: Normal rate and regular rhythm.     Heart sounds: Normal heart sounds. No  murmur. No friction rub. No gallop.   Pulmonary:     Effort: Pulmonary effort is normal.     Breath sounds: Normal breath sounds. No wheezing or rales.  Musculoskeletal:     Cervical back: Neck supple.     Right lower leg: No edema.     Left lower leg: No edema.  Skin:    General: Skin is warm and dry.  Neurological:     Mental Status: She is alert and oriented to person, place, and time.     No results found for this or any previous visit (from the past 24 hour(s)).  No results found.   ASSESSMENT and PLAN  1. Hypertension, unspecified type Controlled. Continue current regime.  - Lipid panel - CMP14+EGFR - TSH  2. Hyperlipidemia LDL goal <70 Checking labs today, medications will be adjusted as needed.  - Lipid panel - CMP14+EGFR  3. H/O pyloric stenosis 4. Screening for colon cancer - Ambulatory referral to Gastroenterology  5. Screening mammogram, encounter for - MM Digital Screening; Future  Other orders   Return in about 6 months (around 07/30/2020).    Rutherford Guys, MD Primary Care at [address], [place] [address] Ph.  [phone] Fax [phone]

## 2020-01-31 NOTE — Patient Instructions (Signed)
° ° ° °  If you have lab work done today you will be contacted with your lab results within the next 2 weeks.  If you have not heard from us then please contact us. The fastest way to get your results is to register for My Chart. ° ° °IF you received an x-ray today, you will receive an invoice from [facility]. Please contact [facility] at [phone] with questions or concerns regarding your invoice.  ° °IF you received labwork today, you will receive an invoice from [facility]. Please contact [facility] at [phone] with questions or concerns regarding your invoice.  ° °Our billing staff will not be able to assist you with questions regarding bills from these companies. ° °You will be contacted with the lab results as soon as they are available. The fastest way to get your results is to activate your My Chart account. Instructions are located on the last page of this paperwork. If you have not heard from us regarding the results in 2 weeks, please contact this office. °  ° ° ° °

## 2020-02-01 LAB — TSH: TSH: 0.627 u[IU]/mL (ref 0.450–4.500)

## 2020-02-01 LAB — CMP14+EGFR
ALT: 12 IU/L (ref 0–32)
AST: 18 IU/L (ref 0–40)
Albumin/Globulin Ratio: 1.3 (ref 1.2–2.2)
Albumin: 3.9 g/dL (ref 3.8–4.8)
Alkaline Phosphatase: 84 IU/L (ref 39–117)
BUN/Creatinine Ratio: 12 (ref 12–28)
BUN: 12 mg/dL (ref 8–27)
Bilirubin Total: <0.2 mg/dL (ref 0.0–1.2)
CO2: 19 mmol/L — ABNORMAL LOW (ref 20–29)
Calcium: 9.9 mg/dL (ref 8.7–10.3)
Chloride: 105 mmol/L (ref 96–106)
Creatinine, Ser: 0.99 mg/dL (ref 0.57–1.00)
GFR calc Af Amer: 67 mL/min/{1.73_m2} (ref >59)
GFR calc non Af Amer: 58 mL/min/{1.73_m2} — ABNORMAL LOW (ref >59)
Globulin, Total: 3 g/dL (ref 1.5–4.5)
Glucose: 96 mg/dL (ref 65–99)
Potassium: 4.1 mmol/L (ref 3.5–5.2)
Sodium: 141 mmol/L (ref 134–144)
Total Protein: 6.9 g/dL (ref 6.0–8.5)

## 2020-02-01 LAB — LIPID PANEL
Chol/HDL Ratio: 4.8 ratio — ABNORMAL HIGH (ref 0.0–4.4)
Cholesterol, Total: 176 mg/dL (ref 100–199)
HDL: 37 mg/dL — ABNORMAL LOW (ref >39)
LDL Chol Calc (NIH): 79 mg/dL (ref 0–99)
Triglycerides: 374 mg/dL — ABNORMAL HIGH (ref 0–149)
VLDL Cholesterol Cal: 60 mg/dL — ABNORMAL HIGH (ref 5–40)

## 2020-02-14 MED ORDER — ATORVASTATIN CALCIUM 40 MG PO TABS: 40.0000 mg | ORAL_TABLET | Freq: Every day | ORAL | 3 refills | Status: DC

## 2020-02-14 NOTE — Addendum Note (Signed)
Addended by: Rutherford Guys on: 02/14/2020 11:32 AM   Modules accepted: Orders

## 2020-03-13 ENCOUNTER — Other Ambulatory Visit: Payer: Self-pay

## 2020-03-13 ENCOUNTER — Other Ambulatory Visit: Payer: Self-pay | Admitting: Internal Medicine

## 2020-03-13 ENCOUNTER — Ambulatory Visit (INDEPENDENT_AMBULATORY_CARE_PROVIDER_SITE_OTHER): Payer: Medicare HMO | Admitting: Adult Health Nurse Practitioner

## 2020-03-13 ENCOUNTER — Ambulatory Visit
Admission: RE | Admit: 2020-03-13 | Discharge: 2020-03-13 | Disposition: A | Discharge status: Home / Self Care | Payer: Medicare HMO | Source: Ambulatory Visit | Attending: Family Medicine | Admitting: Family Medicine

## 2020-03-13 VITALS — BP 148/90 | HR 99 | Temp 98.0°F | Ht 61.0 in | Wt 153.2 lb

## 2020-03-13 DIAGNOSIS — Z1231 Encounter for screening mammogram for malignant neoplasm of breast: Secondary | ICD-10-CM | POA: Diagnosis not present

## 2020-03-13 DIAGNOSIS — R300 Dysuria: Secondary | ICD-10-CM

## 2020-03-13 LAB — POCT URINALYSIS DIP (MANUAL ENTRY)
Bilirubin, UA: NEGATIVE
Glucose, UA: NEGATIVE mg/dL
Ketones, POC UA: NEGATIVE mg/dL
Nitrite, UA: NEGATIVE
Protein Ur, POC: NEGATIVE mg/dL
Spec Grav, UA: 1.01 (ref 1.010–1.025)
Urobilinogen, UA: 0.2 E.U./dL
pH, UA: 5.5 (ref 5.0–8.0)

## 2020-03-13 MED ORDER — NITROFURANTOIN MONOHYD MACRO 100 MG PO CAPS: 100.0000 mg | ORAL_CAPSULE | Freq: Two times a day (BID) | ORAL | 0 refills | Status: AC

## 2020-03-13 MED ORDER — FLUCONAZOLE 150 MG PO TABS: 150.0000 mg | ORAL_TABLET | Freq: Every day | ORAL | 3 refills | Status: DC

## 2020-03-13 NOTE — Patient Instructions (Signed)
° ° ° °  If you have lab work done today you will be contacted with your lab results within the next 2 weeks.  If you have not heard from us then please contact us. The fastest way to get your results is to register for My Chart. ° ° °IF you received an x-ray today, you will receive an invoice from [facility]. Please contact  Radiology at [phone] with questions or concerns regarding your invoice.  ° °IF you received labwork today, you will receive an invoice from [facility]. Please contact [facility] at [phone] with questions or concerns regarding your invoice.  ° °Our billing staff will not be able to assist you with questions regarding bills from these companies. ° °You will be contacted with the lab results as soon as they are available. The fastest way to get your results is to activate your My Chart account. Instructions are located on the last page of this paperwork. If you have not heard from us regarding the results in 2 weeks, please contact this office. °  ° ° ° °

## 2020-03-14 LAB — URINE CULTURE

## 2020-04-11 ENCOUNTER — Other Ambulatory Visit: Payer: Self-pay | Admitting: Gastroenterology

## 2020-04-11 DIAGNOSIS — R635 Abnormal weight gain: Secondary | ICD-10-CM

## 2020-04-11 DIAGNOSIS — R140 Abdominal distension (gaseous): Secondary | ICD-10-CM

## 2020-04-11 DIAGNOSIS — Z1211 Encounter for screening for malignant neoplasm of colon: Secondary | ICD-10-CM | POA: Diagnosis not present

## 2020-04-11 DIAGNOSIS — K311 Adult hypertrophic pyloric stenosis: Secondary | ICD-10-CM

## 2020-04-14 ENCOUNTER — Ambulatory Visit (INDEPENDENT_AMBULATORY_CARE_PROVIDER_SITE_OTHER): Payer: Medicare HMO | Admitting: Family Medicine

## 2020-04-14 ENCOUNTER — Telehealth: Payer: Self-pay | Admitting: *Deleted

## 2020-04-14 VITALS — BP 124/76 | Ht 61.0 in | Wt 153.0 lb

## 2020-04-14 DIAGNOSIS — Z0000 Encounter for general adult medical examination without abnormal findings: Secondary | ICD-10-CM

## 2020-04-14 NOTE — Progress Notes (Signed)
Presents today for [employer] Visit   Date of last exam: 01/31/2020  Interpreter used for this visit? No  I connected with  Meghan Lynn on 04/14/20 by a telephone  and verified that I am speaking with the correct person using two identifiers.   I discussed the limitations of evaluation and management by telemedicine. The patient expressed understanding and agreed to proceed.    Patient Care Team: Rutherford Guys, MD as PCP - General (Family Medicine) Debara Pickett Nadean Corwin, MD as PCP - Cardiology (Cardiology)   Other items to address today:   Discussed immunizations  Discussed Eye/ Dental Will be scheduling colonoscopy after CT scan   Other Screening: Last screening for diabetes: 01/31/2020 Last lipid screening: 08-28-2019  ADVANCE Materials provided  yes On File:no Materials Provided: yes  Immunization status:  Immunization History  Administered Date(s) Administered  . Fluad Quad(high Dose 65+) 12/26/2019  . Influenza, High Dose Seasonal PF 09/26/2018  . Moderna SARS-COVID-2 Vaccination 02/25/2020     Health Maintenance Due  Topic Date Due  . COLONOSCOPY  Never done  . PNA vac Low Risk Adult (1 of 2 - PCV13) Never done     Functional Status Survey: Is the patient deaf or have difficulty hearing?: No Does the patient have difficulty seeing, even when wearing glasses/contacts?: No Does the patient have difficulty concentrating, remembering, or making decisions?: No Does the patient have difficulty walking or climbing stairs?: No Does the patient have difficulty dressing or bathing?: No Does the patient have difficulty doing errands alone such as visiting a doctor's office or shopping?: No   6CIT Screen 04/14/2020  What Year? 0 points  What month? 0 points  What time? 0 points  Count back from 20 0 points  Months in reverse 0 points  Repeat phrase 0 points  Total Score 0        Clinical Support from 04/14/2020 in [facility] at [facility]    AUDIT-C Score  2       Home Environment:   Lives in three story home No trouble climbing stairs No grab bars Adequate lighting/ no clutter Works part-time reception    Patient Active Problem List   Diagnosis Date Noted  . Dysuria 03/13/2020  . H/O pyloric stenosis 01/31/2020  . Acute pain of right wrist 01/21/2020  . Weight gain 07/18/2018  . Hair loss 07/18/2018  . Screening for thyroid disorder 07/18/2018  . Pneumonia   . Peptic ulcer disease   . Hypertension   . Hyperlipidemia   . Headache, migraine   . Gastritis   . Dilatation of esophagus   . Coronary artery disease   . Burning sensation of feet   . Bronchitis due to tobacco use   . Asthma   . Arthritis   . Anginal pain ([place])   . Weakness of both lower extremities 06/22/2017  . Screening for abdominal aortic aneurysm 06/22/2017  . Shortness of breath 09/29/2016  . GERD (gastroesophageal reflux disease) 09/30/2015  . Coronary artery disease due to lipid rich plaque 08/29/2014  . HTN (hypertension) 08/29/2014  . Dyslipidemia 08/29/2014  . Smoker 08/29/2014  . Spondylolisthesis of l3-4 01/27/2013     Past Medical History:  Diagnosis Date  . Active smoker   . Anginal pain ([place])   . Arthritis   . Asthma    patient denies  . Bronchitis due to tobacco use   . Burning sensation of feet   . Coronary artery disease  3 stents to RCA  . Dilatation of esophagus   . Gastritis   . GERD (gastroesophageal reflux disease)   . 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 Headache, migraine    daily  . Hyperlipidemia   . Hypertension   . Peptic ulcer disease   . Pneumonia   . Shortness of breath dyspnea    with exertion     Past Surgical History:  Procedure Laterality Date  . BACK SURGERY    . BALLOON DILATION N/A 05/30/2013   Procedure: BALLOON DILATION;  Surgeon: Missy Sabins, MD;  Location: [facility] ENDOSCOPY;  Service: Endoscopy;  Laterality: N/A;  . BALLOON DILATION N/A 08/02/2013   Procedure: BALLOON  DILATION;  Surgeon: Missy Sabins, MD;  Location: [facility];  Service: Endoscopy;  Laterality: N/A;  . BALLOON DILATION N/A 04/29/2016   Procedure: BALLOON DILATION;  Surgeon: Teena Irani, MD;  Location: [facility] ENDOSCOPY;  Service: Endoscopy;  Laterality: N/A;  . CARDIAC CATHETERIZATION  03/07/2002   moderte CAD 80% (mid RCA) - Dr. Myrtice Lauth  . CARDIAC CATHETERIZATION  09/13/2003   Cypher 2.5x3mm and 2.5x18 and Taxus 2.75x85mm to distal, mid, prox RCA (Dr. Gerrie Nordmann)  . CARDIOLITE MYOCARDIAL PERFUSION STUDY  05/2006   positive bruce protocol, low risk, EF 80%  . CARDIOVASCULAR STRESS TEST  2010  . COLONOSCOPY    . CORONARY ANGIOPLASTY  2004   stents x 3  . DILATATION & CURETTAGE/HYSTEROSCOPY WITH MYOSURE N/A 08/19/2016   Procedure: DILATATION & CURETTAGE/HYSTEROSCOPY WITH MYOSURE;  Surgeon: Servando Salina, MD;  Location: [facility];  Service: Gynecology;  Laterality: N/A;  . ESOPHAGOGASTRODUODENOSCOPY N/A 05/30/2013   Procedure: ESOPHAGOGASTRODUODENOSCOPY (EGD);  Surgeon: Missy Sabins, MD;  Location: [facility] ENDOSCOPY;  Service: Endoscopy;  Laterality: N/A;  . ESOPHAGOGASTRODUODENOSCOPY N/A 08/02/2013   Procedure: ESOPHAGOGASTRODUODENOSCOPY (EGD);  Surgeon: Missy Sabins, MD;  Location: [facility] ENDOSCOPY;  Service: Endoscopy;  Laterality: N/A;  barb Greggory [name]  . ESOPHAGOGASTRODUODENOSCOPY (EGD) WITH PROPOFOL N/A 04/29/2016   Procedure: ESOPHAGOGASTRODUODENOSCOPY (EGD) WITH PROPOFOL;  Surgeon: Teena Irani, MD;  Location: [facility] ENDOSCOPY;  Service: Endoscopy;  Laterality: N/A;  . FRACTURE SURGERY    . ORIF ELBOW FRACTURE  2006  . TRANSTHORACIC ECHOCARDIOGRAM  07/2008   normal LV function, mild MR, mild TR, trace pulm valve regurg     Family History  Problem Relation Age of Onset  . Diabetes Mother   . CAD Mother        CABG  . Bone cancer Father   . Hypertension Father   . Heart disease Brother   . Kidney disease Sister   . Cancer Sister   . Diabetes Sister   . Hyperlipidemia Sister   . Hypertension Sister        Social History   Socioeconomic History  . Marital status: Widowed    Spouse name: Not on file  . Number of children: 3  . Years of education: 53  . Highest education level: Not on file  Occupational History    Employer: [employer]  Tobacco Use  . Smoking status: Current Every Day Smoker    Packs/day: 0.50    Years: 45.00    Pack years: 22.50    Types: Cigarettes  . Smokeless tobacco: Never Used  Substance and Sexual Activity  . Alcohol use: Yes    Alcohol/week: 0.0 standard drinks    Comment: occ  . Drug use: No  . Sexual activity: Yes    Birth control/protection: Abstinence  Other Topics Concern  .  Not on file  Social History Narrative   Epworth Sleepiness scale score: 5   Social Determinants of Health   Financial Resource Strain:   . Difficulty of Paying Living Expenses:   Food Insecurity:   . Worried About Charity fundraiser in the Last Year:   . Arboriculturist in the Last Year:   Transportation Needs:   . Film/video editor (Medical):   [name] Kitchen Lack of Transportation (Non-Medical):   Physical Activity:   . Days of Exercise per Week:   . Minutes of Exercise per Session:   Stress:   . Feeling of Stress :   Social Connections:   . Frequency of Communication with Friends and Family:   . Frequency of Social Gatherings with Friends and Family:   . Attends Religious Services:   . Active Member of Clubs or Organizations:   . Attends Archivist Meetings:   [name] Kitchen Marital Status:   Intimate Partner Violence:   . Fear of Current or Ex-Partner:   . Emotionally Abused:   [name] Kitchen Physically Abused:   . Sexually Abused:      Allergies  Allergen Reactions  . Propoxyphene Other (See Comments)    Darvocet - severe nausea and vomiting  . Latex Itching     Prior to Admission medications   Medication Sig Start Date End Date Taking? Authorizing Provider  aspirin EC 81 MG tablet Take 81 mg by mouth daily.    Yes [provider]  atorvastatin  (LIPITOR) 40 MG tablet Take 1 tablet (40 mg total) by mouth daily. 02/14/20  Yes Rutherford Guys, MD  BIOTIN PO Take 1,000 mg by mouth.   Yes [provider]  hydrochlorothiazide (HYDRODIURIL) 12.5 MG tablet Take 1 tablet (12.5 mg total) by mouth daily. 12/26/19  Yes Posey Boyer, MD  Multiple Vitamin (MULTIVITAMIN WITH MINERALS) TABS Take 1 tablet by mouth daily.   Yes [provider]  omeprazole (PRILOSEC) 20 MG capsule Take 20 mg by mouth daily.   Yes [provider]  valsartan (DIOVAN) 320 MG tablet TAKE 1 TABLET BY MOUTH EVERY DAY 03/14/20  Yes Hilty, Nadean Corwin, MD  COVID-19 mRNA vaccine, Moderna, 100 MCG/0.5ML SUSP Moderna COVID-19 Vaccine (PF) 100 mcg/0.5 mL intramuscular susp. (EUA)  PHARMACY ADMINISTERED    [provider]  fluconazole (DIFLUCAN) 150 MG tablet Take 1 tablet (150 mg total) by mouth daily. 03/13/20   Wendall Mola, NP  metoprolol succinate (TOPROL-XL) 25 MG 24 hr tablet TAKE 1 TABLET BY MOUTH EVERY DAY Patient not taking: Reported on 04/14/2020 12/17/19   Pixie Casino, MD     Depression screen [facility] 2/9 04/14/2020 03/13/2020 01/31/2020 12/26/2019 11/28/2015  Decreased Interest 0 0 0 0 0  Down, Depressed, Hopeless 0 0 0 0 0  PHQ - 2 Score 0 0 0 0 0     Fall Risk  04/14/2020 03/13/2020 01/31/2020 12/26/2019  Falls in the past year? 0 0 0 0  Number falls in past yr: 0 0 0 0  Injury with Fall? 0 0 0 0  Follow up Falls evaluation completed;Education provided Falls evaluation completed - Falls evaluation completed      PHYSICAL EXAM: BP 124/76 Comment: taken from a previous visit  Ht 5\' 1"  (1.549 m)   Wt 153 lb (69.4 kg)   BMI 28.91 kg/m    Wt Readings from Last 3 Encounters:  04/14/20 153 lb (69.4 kg)  03/13/20 153 lb 3.2 oz (69.5 kg)  01/31/20 153 lb (69.4 kg)   Medicare annual wellness visit, subsequent      Education/Counseling provided regarding diet and exercise, prevention of chronic diseases, smoking/tobacco  cessation, if applicable, and reviewed "Covered Medicare Preventive Services."

## 2020-04-14 NOTE — Telephone Encounter (Signed)
No answer for AWV

## 2020-04-14 NOTE — Patient Instructions (Signed)
Thank you for taking time to come for your Medicare Wellness Visit. I appreciate your ongoing commitment to your health goals. Please review the following plan we discussed and let me know if I can assist you in the future.  Jarred Purtee LPN  Preventive Care 70 Years and Older, Female Preventive care refers to lifestyle choices and visits with your health care provider that can promote health and wellness. This includes:  A yearly physical exam. This is also called an annual well check.  Regular dental and eye exams.  Immunizations.  Screening for certain conditions.  Healthy lifestyle choices, such as diet and exercise. What can I expect for my preventive care visit? Physical exam Your health care provider will check:  Height and weight. These may be used to calculate body mass index (BMI), which is a measurement that tells if you are at a healthy weight.  Heart rate and blood pressure.  Your skin for abnormal spots. Counseling Your health care provider may ask you questions about:  Alcohol, tobacco, and drug use.  Emotional well-being.  Home and relationship well-being.  Sexual activity.  Eating habits.  History of falls.  Memory and ability to understand (cognition).  Work and work environment.  Pregnancy and menstrual history. What immunizations do I need?  Influenza (flu) vaccine  This is recommended every year. Tetanus, diphtheria, and pertussis (Tdap) vaccine  You may need a Td booster every 10 years. Varicella (chickenpox) vaccine  You may need this vaccine if you have not already been vaccinated. Zoster (shingles) vaccine  You may need this after age 60. Pneumococcal conjugate (PCV13) vaccine  One dose is recommended after age 70. Pneumococcal polysaccharide (PPSV23) vaccine  One dose is recommended after age 70. Measles, mumps, and rubella (MMR) vaccine  You may need at least one dose of MMR if you were born in 1957 or later. You may also  need a second dose. Meningococcal conjugate (MenACWY) vaccine  You may need this if you have certain conditions. Hepatitis A vaccine  You may need this if you have certain conditions or if you travel or work in places where you may be exposed to hepatitis A. Hepatitis B vaccine  You may need this if you have certain conditions or if you travel or work in places where you may be exposed to hepatitis B. Haemophilus influenzae type b (Hib) vaccine  You may need this if you have certain conditions. You may receive vaccines as individual doses or as more than one vaccine together in one shot (combination vaccines). Talk with your health care provider about the risks and benefits of combination vaccines. What tests do I need? Blood tests  Lipid and cholesterol levels. These may be checked every 5 years, or more frequently depending on your overall health.  Hepatitis C test.  Hepatitis B test. Screening  Lung cancer screening. You may have this screening every year starting at age 55 if you have a 30-pack-year history of smoking and currently smoke or have quit within the past 15 years.  Colorectal cancer screening. All adults should have this screening starting at age 50 and continuing until age 75. Your health care provider may recommend screening at age 45 if you are at increased risk. You will have tests every 1-10 years, depending on your results and the type of screening test.  Diabetes screening. This is done by checking your blood sugar (glucose) after you have not eaten for a while (fasting). You may have this done every 1-3   years.  Mammogram. This may be done every 1-2 years. Talk with your health care provider about how often you should have regular mammograms.  BRCA-related cancer screening. This may be done if you have a family history of breast, ovarian, tubal, or peritoneal cancers. Other tests  Sexually transmitted disease (STD) testing.  Bone density scan. This is done  to screen for osteoporosis. You may have this done starting at age 70. Follow these instructions at home: Eating and drinking  Eat a diet that includes fresh fruits and vegetables, whole grains, lean protein, and low-fat dairy products. Limit your intake of foods with high amounts of sugar, saturated fats, and salt.  Take vitamin and mineral supplements as recommended by your health care provider.  Do not drink alcohol if your health care provider tells you not to drink.  If you drink alcohol: ? Limit how much you have to 0-1 drink a day. ? Be aware of how much alcohol is in your drink. In the [place] one drink equals one 12 oz bottle of beer (355 mL), one 5 oz glass of wine (148 mL), or one 1 oz glass of hard liquor (44 mL). Lifestyle  Take daily care of your teeth and gums.  Stay active. Exercise for at least 30 minutes on 5 or more days each week.  Do not use any products that contain nicotine or tobacco, such as cigarettes, e-cigarettes, and chewing tobacco. If you need help quitting, ask your health care provider.  If you are sexually active, practice safe sex. Use a condom or other form of protection in order to prevent STIs (sexually transmitted infections).  Talk with your health care provider about taking a low-dose aspirin or statin. What's next?  Go to your health care provider once a year for a well check visit.  Ask your health care provider how often you should have your eyes and teeth checked.  Stay up to date on all vaccines. This information is not intended to replace advice given to you by your health care provider. Make sure you discuss any questions you have with your health care provider. Document Revised: 12/07/2018 Document Reviewed: 12/07/2018 Elsevier Patient Education  2020 [employer].

## 2020-04-15 ENCOUNTER — Other Ambulatory Visit: Payer: Self-pay | Admitting: Gastroenterology

## 2020-04-28 ENCOUNTER — Ambulatory Visit
Admission: RE | Admit: 2020-04-28 | Discharge: 2020-04-28 | Disposition: A | Discharge status: Home / Self Care | Payer: Medicare HMO | Source: Ambulatory Visit | Attending: Gastroenterology | Admitting: Gastroenterology

## 2020-04-28 ENCOUNTER — Other Ambulatory Visit: Payer: Self-pay

## 2020-04-28 DIAGNOSIS — K311 Adult hypertrophic pyloric stenosis: Secondary | ICD-10-CM

## 2020-04-28 DIAGNOSIS — R140 Abdominal distension (gaseous): Secondary | ICD-10-CM | POA: Diagnosis not present

## 2020-04-28 DIAGNOSIS — R635 Abnormal weight gain: Secondary | ICD-10-CM

## 2020-04-28 MED ORDER — IOPAMIDOL (ISOVUE-300) INJECTION 61%
100.0000 mL | Freq: Once | INTRAVENOUS | Status: AC | PRN
  Administered 2020-04-28: 100 mL via INTRAVENOUS

## 2020-05-07 NOTE — Progress Notes (Signed)
Chief Complaint  Patient presents with  . Dysuria    Pt stated that she has been experiencing some pain when urinating along with some light blood when she wipes for the past 2 weeks.    HPI  Pain with urination and slight blood noted when she wipes for the past 2 weeks.  Denies headache, fevers, chills.  No systemic symptoms.   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  Medications    Current Outpatient Medications:  .  aspirin EC 81 MG tablet, Take 81 mg by mouth daily. , Disp: , Rfl:  .  atorvastatin (LIPITOR) 40 MG tablet, Take 1 tablet (40 mg total) by mouth daily., Disp: 90 tablet, Rfl: 3 .  BIOTIN PO, Take 1,000 mg by mouth., Disp: , Rfl:  .  COVID-19 mRNA vaccine, Moderna, 100 MCG/0.5ML SUSP, Moderna COVID-19 Vaccine (PF) 100 mcg/0.5 mL intramuscular susp. (EUA)  PHARMACY ADMINISTERED, Disp: , Rfl:  .  hydrochlorothiazide (HYDRODIURIL) 12.5 MG tablet, Take 1 tablet (12.5 mg total) by mouth daily., Disp: 90 tablet, Rfl: 1 .  metoprolol succinate (TOPROL-XL) 25 MG 24 hr tablet, TAKE 1 TABLET BY MOUTH EVERY DAY (Patient not taking: Reported on 04/14/2020), Disp: 90 tablet, Rfl: 3 .  Multiple Vitamin  (MULTIVITAMIN WITH MINERALS) TABS, Take 1 tablet by mouth daily., Disp: , Rfl:  .  omeprazole (PRILOSEC) 20 MG capsule, Take 20 mg by mouth daily., Disp: , Rfl:  .  fluconazole (DIFLUCAN) 150 MG tablet, Take 1 tablet (150 mg total) by mouth daily., Disp: 3 tablet, Rfl: 3 .  valsartan (DIOVAN) 320 MG tablet, TAKE 1 TABLET BY MOUTH EVERY DAY, Disp: 90 tablet, Rfl: 1   Review of Systems    Constitutional: Negative for activity change, appetite change, chills and fever.  HENT: Negative for congestion, nosebleeds, trouble swallowing and voice change.   Respiratory: Negative for cough, shortness of breath and wheezing.   Cardiac:  Negative for chest pain, pressure, syncope  Gastrointestinal: Negative for diarrhea, nausea and vomiting.  Genitourinary: Negative for difficulty urinating, dysuria, flank pain and hematuria.  Musculoskeletal: Negative for back pain, joint swelling and neck pain.  Neurological: Negative for dizziness, speech difficulty, light-headedness and numbness.  See HPI. All other review of systems negative.     Physical Exam:    height is 5\' 1"  (1.549 m) and weight is 153 lb 3.2 oz (69.5 kg). Her temporal temperature is 98 F (36.7 C). Her blood pressure is 148/90 (abnormal) and her pulse is 99. Her oxygen saturation is 96%.   Physical Examination: General appearance - alert, well appearing, and in no distress and oriented to person, place, and time Mental status - normal mood, behavior, speech, dress, motor activity, and thought processes Eyes - PERRL. Extraocular movements intact.  No nystagmus.  Neck - supple, no significant adenopathy, carotids upstroke normal bilaterally, no bruits, thyroid exam: thyroid is normal in size  without nodules or tenderness Chest - clear to auscultation, no wheezes, rales or rhonchi, symmetric air entry  Heart - normal rate, regular rhythm, normal S1, S2, no murmurs, rubs, clicks or gallops Extremities - dependent LE edema without clubbing or  cyanosis Skin - normal coloration and turgor, no rashes, no suspicious skin lesions noted  No hyperpigmentation of skin.  No current hematomas noted   Lab /Imaging Review      Assessment & Plan:  Meghan Lynn is a 69 y.o. female    1. Dysuria    Orders Placed This Encounter  Procedures  . Urine Culture  . POCT urinalysis dipstick   Meds ordered this encounter  Medications  . nitrofurantoin, macrocrystal-monohydrate, (MACROBID) 100 MG capsule    Sig: Take 1 capsule (100 mg total) by mouth 2 (two) times daily for 3 days.    Dispense:  6 capsule    Refill:  0  . fluconazole (DIFLUCAN) 150 MG tablet    Sig: Take 1 tablet (150 mg total) by mouth daily.    Dispense:  3 tablet    Refill:  3     Glyn Ade, NP

## 2020-05-16 NOTE — Progress Notes (Signed)
Patient returned call and LVMM in regards to procedure on 05/22/20 stating she had received her preprocedure instructions already.

## 2020-05-21 NOTE — H&P (Signed)
History of Present Illness  General:  69 year old female, previous patient of Dr. Amedeo Plenty, with pyloric stenosis. EGD was last performed in 2017 with balloon dilation of pyloric stenosis with 10/11 and 20 mm balloon. EGD performed in 2014 for nausea, vomiting, suspected pyloric stenosis, balloon dilation performed. In 2009 she had a balloon dilation up to 15 MM of pyloric stenosis. Last colonoscopy was in 2008. She has been doing ok, she has been gaining weight in her "stomach" abdomen, she feels distended, she eats very small 1-2 meals a day, she takes a laxative and doesn't "empty" well. She drinks a lot of fluids when eating so her "food goes down", she avoids peanuts and broccoli, at times, it feels stuck in upper abdomen and feels full, she continues to drink and she burps and "regurgitates and coughs " food out. She has a BM every day, mostly in the morning, not a "full movement", moderately soft, sometimes later in the evening she may have another one, she takes OTC miralax or stool softner. There is no family history of colon cancer. She denies acid reflux or heartburn.   Current Medications  Taking   Aciphex(RABEprazole Sodium) 20 MG Tablet Delayed Release 1 tablet Orally twice a day   Aspirin 81 MG Tablet Delayed Release 1 tablet Orally Once a day   MVI 1 tab Oral daily   Niaspan(Niacin ER (Antihyperlipidemic)) 750 MG Tablet Extended Release 1 tablet at bedtime Orally Once a day   Valsartan 320 MG Tablet 1 tablet Orally Once a day   Not-Taking   Atorvastatin Calcium 20 MG Tablet 1 tablet Orally Once a day   Diovan(Valsartan) 320 MG Tablet 1 tablet Orally Once a day   Metoprolol Tartrate 25 mg Tablet 1 tablet Orally once a day   Medication List reviewed and reconciled with the patient    Past Medical History  Chest pain.   Headache.   Hypertension.   Abdominal pain.   Reflux.   Dysphagia.   Peptic ulcer disease.   Asthma.   Hemoptysis.   Irregular heart beat.    Bronchitis.   Coronary artery disease.   Depression.   Gastritis.    Surgical History  cardiac stent 2004  tubal ligation   elbow surgery, right 2011  EGD 07/1995,11/2007,01/2008  back surgery 12/2012   Family History  Father: deceased, Bone cancer, diagnosed with Hypertension  Mother: deceased, Colon polyps, Diabetes  2 brother(s) , 4 sister(s) . 3daughter(s) .   neg for colon cancer or liver disease.   Social History  General:  Tobacco use  cigarettes: Current smoker Tobacco history last updated 04/11/2020 EXPOSURE TO PASSIVE SMOKE: yes, 1 PPD.  Alcohol: yes, Rare, beer, 1-2 per month or year.  no Recreational drug use.    Allergies  Darvon: GI upset - Allergy   Hospitalization/Major Diagnostic Procedure  back surgery 12/2012  not in past yr 03/2016   Review of Systems  GI PROCEDURE:  no Pacemaker/ AICD, no. no Artificial heart valves. no MI/heart attack. no Abnormal heart rhythm. no Angina. no CVA. Hypertension YES. no Hypotension. no Asthma, COPD. no Sleep apnea. no Seizure disorders. no Artificial joints. no Severe DJD. no Diabetes. no Significant headaches. no Vertigo. no Depression/anxiety. no Abnormal bleeding. no Kidney Disease. no Liver disease, no. no Blood transfusion.       Vital Signs  Wt 154, Wt change 10.8 lb, Ht 60, BMI 30.07, Temp 97.7, Pulse sitting 82, BP sitting 172/100 l arm, Repeat BP 162/92 l arm Wt per  pt/HL,CMA.   Examination  Gastroenterology:: GENERAL APPEARANCE: Well developed, well nourished, no active distress, pleasant.  SCLERA: anicteric.  CARDIOVASCULAR PMI LS border. Normal RRR w/o murmers or gallops. No peripheral edema.  RESPIRATORY Breath sounds normal. Respiration even and unlabored.  ABDOMEN No masses palpated. Liver and spleen not palpated, normal. Bowel sounds normal, Abdomen distended.  EXTREMITIES: No edema.  NEURO: alert,oriented to time,place and person,normal gait.  PSYCH: mood/affect normal.     Assessments      1. Pyloric stenosis in adult - K31.1 (Primary)   2. Screen for colon cancer - Z12.11   3. Abdominal distension - R14.0   4. Weight gain - R63.5   Treatment  1. Pyloric stenosis in adult  IMAGING: EGD w/ DILATATION CT ABD PEL WTH IV CON- no acute findings noted Notes: Recommend diagnostic EGD with balloon dilation of pylorus. The risks and the benefits of the procedure were discussed with the patient in details.  She understands and verbalizes consent.    2. Screen for colon cancer  IMAGING: Colonoscopy Notes: The risks and the benefits of the procedure were discussed with the patient in details.  She understands and verbalizes consent.  She will be given written instructions,prescription for preparation and will be scheduled for the same.    3. Abdominal distension   Notes: SHe has gained 30 lbs in the past 1-2 years and is very worried about abdominal distension. WIll get a CT of abdomen and pelvis to r/o ascites or other cause of abdominal distension and weight gain(very unsual in a patient with pyloric stenosis and she takes small meals).    4. Weight gain   Notes: Will evaluate with CT as it is unusual for her to gain wieght wih pyloric stenosis and consumption of small meals. R/O ascites.

## 2020-05-22 ENCOUNTER — Encounter (HOSPITAL_COMMUNITY): Admission: RE | Payer: Self-pay | Source: Home / Self Care

## 2020-05-22 ENCOUNTER — Ambulatory Visit (HOSPITAL_COMMUNITY): Admission: RE | Admit: 2020-05-22 | Payer: Medicare HMO | Source: Home / Self Care | Admitting: Gastroenterology

## 2020-05-22 SURGERY — COLONOSCOPY WITH PROPOFOL
Anesthesia: Monitor Anesthesia Care

## 2020-06-04 DIAGNOSIS — Z809 Family history of malignant neoplasm, unspecified: Secondary | ICD-10-CM | POA: Diagnosis not present

## 2020-06-04 DIAGNOSIS — Z7982 Long term (current) use of aspirin: Secondary | ICD-10-CM | POA: Diagnosis not present

## 2020-06-04 DIAGNOSIS — E663 Overweight: Secondary | ICD-10-CM | POA: Diagnosis not present

## 2020-06-04 DIAGNOSIS — Z6829 Body mass index (BMI) 29.0-29.9, adult: Secondary | ICD-10-CM | POA: Diagnosis not present

## 2020-06-04 DIAGNOSIS — E785 Hyperlipidemia, unspecified: Secondary | ICD-10-CM | POA: Diagnosis not present

## 2020-06-04 DIAGNOSIS — K219 Gastro-esophageal reflux disease without esophagitis: Secondary | ICD-10-CM | POA: Diagnosis not present

## 2020-06-04 DIAGNOSIS — Z720 Tobacco use: Secondary | ICD-10-CM | POA: Diagnosis not present

## 2020-06-04 DIAGNOSIS — I2510 Atherosclerotic heart disease of native coronary artery without angina pectoris: Secondary | ICD-10-CM | POA: Diagnosis not present

## 2020-06-04 DIAGNOSIS — Z008 Encounter for other general examination: Secondary | ICD-10-CM | POA: Diagnosis not present

## 2020-06-04 DIAGNOSIS — M1990 Unspecified osteoarthritis, unspecified site: Secondary | ICD-10-CM | POA: Diagnosis not present

## 2020-06-04 DIAGNOSIS — I10 Essential (primary) hypertension: Secondary | ICD-10-CM | POA: Diagnosis not present

## 2020-06-12 ENCOUNTER — Other Ambulatory Visit (HOSPITAL_COMMUNITY)
Admission: RE | Admit: 2020-06-12 | Discharge: 2020-06-12 | Disposition: A | Discharge status: Home / Self Care | Payer: Medicare HMO | Source: Ambulatory Visit | Attending: Gastroenterology | Admitting: Gastroenterology

## 2020-06-12 DIAGNOSIS — Z20822 Contact with and (suspected) exposure to covid-19: Secondary | ICD-10-CM | POA: Diagnosis not present

## 2020-06-12 DIAGNOSIS — Z01812 Encounter for preprocedural laboratory examination: Secondary | ICD-10-CM | POA: Insufficient documentation

## 2020-06-12 LAB — SARS CORONAVIRUS 2 (TAT 6-24 HRS): SARS Coronavirus 2: NEGATIVE

## 2020-06-13 ENCOUNTER — Other Ambulatory Visit: Payer: Self-pay

## 2020-06-13 ENCOUNTER — Encounter (HOSPITAL_COMMUNITY): Payer: Self-pay | Admitting: Gastroenterology

## 2020-06-13 DIAGNOSIS — Z1211 Encounter for screening for malignant neoplasm of colon: Secondary | ICD-10-CM | POA: Diagnosis not present

## 2020-06-13 DIAGNOSIS — K2950 Unspecified chronic gastritis without bleeding: Secondary | ICD-10-CM | POA: Diagnosis not present

## 2020-06-13 DIAGNOSIS — F17210 Nicotine dependence, cigarettes, uncomplicated: Secondary | ICD-10-CM | POA: Diagnosis not present

## 2020-06-13 DIAGNOSIS — R140 Abdominal distension (gaseous): Secondary | ICD-10-CM | POA: Diagnosis not present

## 2020-06-13 DIAGNOSIS — K219 Gastro-esophageal reflux disease without esophagitis: Secondary | ICD-10-CM | POA: Diagnosis not present

## 2020-06-13 DIAGNOSIS — J45909 Unspecified asthma, uncomplicated: Secondary | ICD-10-CM | POA: Diagnosis not present

## 2020-06-13 DIAGNOSIS — K633 Ulcer of intestine: Secondary | ICD-10-CM | POA: Diagnosis not present

## 2020-06-13 DIAGNOSIS — Z885 Allergy status to narcotic agent status: Secondary | ICD-10-CM | POA: Diagnosis not present

## 2020-06-13 DIAGNOSIS — R635 Abnormal weight gain: Secondary | ICD-10-CM | POA: Diagnosis not present

## 2020-06-13 DIAGNOSIS — Z955 Presence of coronary angioplasty implant and graft: Secondary | ICD-10-CM | POA: Diagnosis not present

## 2020-06-13 DIAGNOSIS — I2510 Atherosclerotic heart disease of native coronary artery without angina pectoris: Secondary | ICD-10-CM | POA: Diagnosis not present

## 2020-06-13 DIAGNOSIS — Z79899 Other long term (current) drug therapy: Secondary | ICD-10-CM | POA: Diagnosis not present

## 2020-06-13 DIAGNOSIS — I10 Essential (primary) hypertension: Secondary | ICD-10-CM | POA: Diagnosis not present

## 2020-06-13 DIAGNOSIS — Z7982 Long term (current) use of aspirin: Secondary | ICD-10-CM | POA: Diagnosis not present

## 2020-06-13 DIAGNOSIS — D122 Benign neoplasm of ascending colon: Secondary | ICD-10-CM | POA: Diagnosis not present

## 2020-06-13 DIAGNOSIS — K311 Adult hypertrophic pyloric stenosis: Secondary | ICD-10-CM | POA: Diagnosis not present

## 2020-06-13 NOTE — Progress Notes (Signed)
Pre call complete for endoscopy procedure on Monday 06/16/20. Patient confirms she has been quarantined since COVID test and will remain so over weekend, will follow colon prep orders and has a ride taking her home post procedure. All questions addressed.

## 2020-06-16 ENCOUNTER — Encounter (HOSPITAL_COMMUNITY)
Admission: RE | Disposition: A | Discharge status: Home / Self Care | Payer: Self-pay | Source: Home / Self Care | Attending: Gastroenterology

## 2020-06-16 ENCOUNTER — Ambulatory Visit (HOSPITAL_COMMUNITY): Payer: Medicare HMO | Admitting: Certified Registered Nurse Anesthetist

## 2020-06-16 ENCOUNTER — Other Ambulatory Visit: Payer: Self-pay

## 2020-06-16 ENCOUNTER — Encounter (HOSPITAL_COMMUNITY): Payer: Self-pay | Admitting: Gastroenterology

## 2020-06-16 ENCOUNTER — Other Ambulatory Visit: Payer: Self-pay | Admitting: Physician Assistant

## 2020-06-16 ENCOUNTER — Ambulatory Visit (HOSPITAL_COMMUNITY)
Admission: RE | Admit: 2020-06-16 | Discharge: 2020-06-16 | Disposition: A | Discharge status: Home / Self Care | Payer: Medicare HMO | Attending: Gastroenterology | Admitting: Gastroenterology

## 2020-06-16 DIAGNOSIS — D122 Benign neoplasm of ascending colon: Secondary | ICD-10-CM | POA: Diagnosis not present

## 2020-06-16 DIAGNOSIS — Z885 Allergy status to narcotic agent status: Secondary | ICD-10-CM | POA: Insufficient documentation

## 2020-06-16 DIAGNOSIS — K633 Ulcer of intestine: Secondary | ICD-10-CM | POA: Insufficient documentation

## 2020-06-16 DIAGNOSIS — K311 Adult hypertrophic pyloric stenosis: Secondary | ICD-10-CM | POA: Insufficient documentation

## 2020-06-16 DIAGNOSIS — I10 Essential (primary) hypertension: Secondary | ICD-10-CM | POA: Diagnosis not present

## 2020-06-16 DIAGNOSIS — K2950 Unspecified chronic gastritis without bleeding: Secondary | ICD-10-CM | POA: Diagnosis not present

## 2020-06-16 DIAGNOSIS — K635 Polyp of colon: Secondary | ICD-10-CM | POA: Diagnosis not present

## 2020-06-16 DIAGNOSIS — K219 Gastro-esophageal reflux disease without esophagitis: Secondary | ICD-10-CM | POA: Diagnosis not present

## 2020-06-16 DIAGNOSIS — R635 Abnormal weight gain: Secondary | ICD-10-CM | POA: Diagnosis not present

## 2020-06-16 DIAGNOSIS — Z1211 Encounter for screening for malignant neoplasm of colon: Secondary | ICD-10-CM | POA: Insufficient documentation

## 2020-06-16 DIAGNOSIS — Z79899 Other long term (current) drug therapy: Secondary | ICD-10-CM | POA: Insufficient documentation

## 2020-06-16 DIAGNOSIS — Z955 Presence of coronary angioplasty implant and graft: Secondary | ICD-10-CM | POA: Insufficient documentation

## 2020-06-16 DIAGNOSIS — F17210 Nicotine dependence, cigarettes, uncomplicated: Secondary | ICD-10-CM | POA: Insufficient documentation

## 2020-06-16 DIAGNOSIS — Z7982 Long term (current) use of aspirin: Secondary | ICD-10-CM | POA: Insufficient documentation

## 2020-06-16 DIAGNOSIS — Z120 Encounter for screening for malignant neoplasm of stomach: Secondary | ICD-10-CM | POA: Diagnosis not present

## 2020-06-16 DIAGNOSIS — J45909 Unspecified asthma, uncomplicated: Secondary | ICD-10-CM | POA: Diagnosis not present

## 2020-06-16 DIAGNOSIS — I2510 Atherosclerotic heart disease of native coronary artery without angina pectoris: Secondary | ICD-10-CM | POA: Insufficient documentation

## 2020-06-16 DIAGNOSIS — R140 Abdominal distension (gaseous): Secondary | ICD-10-CM | POA: Insufficient documentation

## 2020-06-16 DIAGNOSIS — K3189 Other diseases of stomach and duodenum: Secondary | ICD-10-CM | POA: Diagnosis not present

## 2020-06-16 HISTORY — PX: COLONOSCOPY WITH PROPOFOL: SHX5780

## 2020-06-16 HISTORY — PX: BALLOON DILATION: SHX5330

## 2020-06-16 HISTORY — PX: POLYPECTOMY: SHX5525

## 2020-06-16 HISTORY — PX: ESOPHAGOGASTRODUODENOSCOPY (EGD) WITH PROPOFOL: SHX5813

## 2020-06-16 HISTORY — PX: BIOPSY: SHX5522

## 2020-06-16 LAB — POCT I-STAT, CHEM 8
BUN: 9 mg/dL (ref 8–23)
Calcium, Ion: 1.14 mmol/L — ABNORMAL LOW (ref 1.15–1.40)
Chloride: 109 mmol/L (ref 98–111)
Creatinine, Ser: 0.8 mg/dL (ref 0.44–1.00)
Glucose, Bld: 98 mg/dL (ref 70–99)
HCT: 53 % — ABNORMAL HIGH (ref 36.0–46.0)
Hemoglobin: 18 g/dL — ABNORMAL HIGH (ref 12.0–15.0)
Potassium: 3.5 mmol/L (ref 3.5–5.1)
Sodium: 143 mmol/L (ref 135–145)
TCO2: 22 mmol/L (ref 22–32)

## 2020-06-16 SURGERY — COLONOSCOPY WITH PROPOFOL
Anesthesia: Monitor Anesthesia Care

## 2020-06-16 MED ORDER — LACTATED RINGERS IV SOLN: INTRAVENOUS | Status: DC

## 2020-06-16 MED ORDER — IPRATROPIUM-ALBUTEROL 0.5-2.5 (3) MG/3ML IN SOLN
3.0000 mL | Freq: Once | RESPIRATORY_TRACT | Status: AC
  Administered 2020-06-16: 3 mL via RESPIRATORY_TRACT

## 2020-06-16 MED ORDER — ONDANSETRON HCL 4 MG/2ML IJ SOLN
INTRAMUSCULAR | Status: DC | PRN
  Administered 2020-06-16: 4 mg via INTRAVENOUS

## 2020-06-16 MED ORDER — ALBUTEROL SULFATE (2.5 MG/3ML) 0.083% IN NEBU
INHALATION_SOLUTION | RESPIRATORY_TRACT | Status: AC
  Filled 2020-06-16: qty 3

## 2020-06-16 MED ORDER — LIDOCAINE HCL (CARDIAC) PF 100 MG/5ML IV SOSY
PREFILLED_SYRINGE | INTRAVENOUS | Status: DC | PRN
  Administered 2020-06-16: 80 mg via INTRAVENOUS

## 2020-06-16 MED ORDER — FENTANYL CITRATE (PF) 100 MCG/2ML IJ SOLN
INTRAMUSCULAR | Status: AC
  Filled 2020-06-16: qty 2

## 2020-06-16 MED ORDER — ALBUTEROL SULFATE HFA 108 (90 BASE) MCG/ACT IN AERS
INHALATION_SPRAY | RESPIRATORY_TRACT | Status: DC | PRN
  Administered 2020-06-16 (×2): 5 via RESPIRATORY_TRACT

## 2020-06-16 MED ORDER — SUCCINYLCHOLINE CHLORIDE 20 MG/ML IJ SOLN
INTRAMUSCULAR | Status: DC | PRN
  Administered 2020-06-16: 120 mg via INTRAVENOUS

## 2020-06-16 MED ORDER — PROPOFOL 10 MG/ML IV BOLUS
INTRAVENOUS | Status: DC | PRN
  Administered 2020-06-16: 20 mg via INTRAVENOUS
  Administered 2020-06-16: 150 mg via INTRAVENOUS
  Administered 2020-06-16: 30 mg via INTRAVENOUS

## 2020-06-16 MED ORDER — FENTANYL CITRATE (PF) 100 MCG/2ML IJ SOLN
INTRAMUSCULAR | Status: DC | PRN
  Administered 2020-06-16 (×2): 50 ug via INTRAVENOUS

## 2020-06-16 MED ORDER — DEXAMETHASONE SODIUM PHOSPHATE 10 MG/ML IJ SOLN
INTRAMUSCULAR | Status: DC | PRN
Start: 2020-06-16 — End: 2020-06-16
  Administered 2020-06-16: 10 mg via INTRAVENOUS

## 2020-06-16 SURGICAL SUPPLY — 25 items

## 2020-06-16 NOTE — Anesthesia Preprocedure Evaluation (Addendum)
Anesthesia Evaluation  Patient identified by MRN, date of birth, ID band Patient awake    Reviewed: Allergy & Precautions, NPO status , Patient's Chart, lab work & pertinent test results  Airway Mallampati: II  TM Distance: >3 FB     Dental   Pulmonary shortness of breath, asthma , pneumonia, Current Smoker and Patient abstained from smoking.,    breath sounds clear to auscultation       Cardiovascular hypertension, + angina + CAD   Rhythm:Regular Rate:Normal     Neuro/Psych  Headaches,    GI/Hepatic Neg liver ROS, PUD, GERD  ,  Endo/Other  negative endocrine ROS  Renal/GU negative Renal ROS     Musculoskeletal  (+) Arthritis ,   Abdominal   Peds  Hematology   Anesthesia Other Findings   Reproductive/Obstetrics                             Anesthesia Physical Anesthesia Plan  ASA: III  Anesthesia Plan: General   Post-op Pain Management:    Induction: Intravenous  PONV Risk Score and Plan: 1 and Ondansetron, Dexamethasone and Midazolam  Airway Management Planned: Oral ETT  Additional Equipment:   Intra-op Plan:   Post-operative Plan: Extubation in OR  Informed Consent: I have reviewed the patients History and Physical, chart, labs and discussed the procedure including the risks, benefits and alternatives for the proposed anesthesia with the patient or authorized representative who has indicated his/her understanding and acceptance.     Dental advisory given  Plan Discussed with: CRNA and Anesthesiologist  Anesthesia Plan Comments:        Anesthesia Quick Evaluation

## 2020-06-16 NOTE — Interval H&P Note (Signed)
History and Physical Interval Note: 69/female with history of pyloric stenosis for an EGD with pyloric channel dilation and screening colonoscopy with propofol.  06/16/2020 10:37 AM  Norval Gable  has presented today for EGD with balloon dilation and screening colonoscopy with the diagnosis of Screening, Pyloric stenosis.  The various methods of treatment have been discussed with the patient and family. After consideration of risks, benefits and other options for treatment, the patient has consented to  Procedure(s): COLONOSCOPY WITH PROPOFOL (N/A) ESOPHAGOGASTRODUODENOSCOPY (EGD) WITH PROPOFOL (N/A) BALLOON DILATION (N/A) as a surgical intervention.  The patient's history has been reviewed, patient examined, no change in status, stable for surgery.  I have reviewed the patient's chart and labs.  Questions were answered to the patient's satisfaction.     Ronnette Juniper

## 2020-06-16 NOTE — Transfer of Care (Signed)
Immediate Anesthesia Transfer of Care Note  Patient: Meghan Lynn  Procedure(s) Performed: COLONOSCOPY WITH PROPOFOL (N/A ) ESOPHAGOGASTRODUODENOSCOPY (EGD) WITH PROPOFOL (N/A ) BALLOON DILATION (N/A ) BIOPSY POLYPECTOMY  Patient Location: PACU and Endoscopy Unit  Anesthesia Type:General  Level of Consciousness: patient cooperative and responds to stimulation  Airway & Oxygen Therapy: Patient Spontanous Breathing, Patient connected to face mask oxygen and Albuterol neb ordered per Dr Nyoka Cowden  Post-op Assessment: Report given to RN and Post -op Vital signs reviewed and stable  Post vital signs: Reviewed and stable  Last Vitals:  Vitals Value Taken Time  BP    Temp    Pulse 92 06/16/20 1201  Resp 21 06/16/20 1201  SpO2 100 % 06/16/20 1201  Vitals shown include unvalidated device data.  Last Pain:  Vitals:   06/16/20 0928  TempSrc: Oral  PainSc: 10-Worst pain ever         Complications: No complications documented.

## 2020-06-16 NOTE — Op Note (Signed)
[facility] Patient Name: Meghan Lynn Procedure Date : 06/16/2020 MRN: [id number] Attending MD: Ronnette Juniper , MD Date of Birth: [birth year] CSN: [id number] Age: 69 Admit Type: Inpatient Procedure:                Colonoscopy Indications:              Screening for colorectal malignant neoplasm, Last                            colonoscopy: 2009 Providers:                Ronnette Juniper, MD, Angus Seller, Theodora Blow,                            Technician Referring MD:             Grant Fontana, MD Medicines:                Monitored Anesthesia Care Complications:            No immediate complications. Estimated blood loss:                            Minimal. Estimated Blood Loss:     Estimated blood loss was minimal. Procedure:                Pre-Anesthesia Assessment:                           - Prior to the procedure, a History and Physical                            was performed, and patient medications and                            allergies were reviewed. The patient's tolerance of                            previous anesthesia was also reviewed. The risks                            and benefits of the procedure and the sedation                            options and risks were discussed with the patient.                            All questions were answered, and informed consent                            was obtained. Prior Anticoagulants: The patient has                            taken no previous anticoagulant or antiplatelet                            agents. ASA Grade Assessment: III -  A patient with                            severe systemic disease. After reviewing the risks                            and benefits, the patient was deemed in                            satisfactory condition to undergo the procedure.                           - Prior to the procedure, a History and Physical                            was performed, and patient medications and                             allergies were reviewed. The patient's tolerance of                            previous anesthesia was also reviewed. The risks                            and benefits of the procedure and the sedation                            options and risks were discussed with the patient.                            All questions were answered, and informed consent                            was obtained. Prior Anticoagulants: The patient has                            taken no previous anticoagulant or antiplatelet                            agents. ASA Grade Assessment: III - A patient with                            severe systemic disease. After reviewing the risks                            and benefits, the patient was deemed in                            satisfactory condition to undergo the procedure.                           After obtaining informed consent, the colonoscope  was passed under direct vision. Throughout the                            procedure, the patient's blood pressure, pulse, and                            oxygen saturations were monitored continuously. The                            PCF-H190DL ([id number] Olympus pediatric colonoscope                            was introduced through the anus and advanced to the                            the cecum, identified by appendiceal orifice and                            ileocecal valve. The colonoscopy was performed                            without difficulty. The patient tolerated the                            procedure well. The quality of the bowel                            preparation was adequate to identify polyps 6 mm                            and larger in size. Scope In: 11:27:44 AM Scope Out: 11:41:42 AM Scope Withdrawal Time: 0 hours 9 minutes 48 seconds  Total Procedure Duration: 0 hours 13 minutes 58 seconds  Findings:      The perianal and digital rectal  examinations were normal.      A 8 mm polyp was found in the ascending colon. The polyp was sessile.       The polyp was removed with a hot snare. Resection and retrieval were       complete.      A localized area of moderately ulcerated mucosa was found in the cecum.       Biopsies were taken with a cold forceps for histology.      The exam was otherwise without abnormality on direct and retroflexion       views. Impression:               - One 8 mm polyp in the ascending colon, removed                            with a hot snare. Resected and retrieved.                           - Ulcerated mucosa in the cecum. Biopsied.                           -  The examination was otherwise normal on direct                            and retroflexion views. Moderate Sedation:      Patient did not receive moderate sedation for this procedure, but       instead received monitored anesthesia care. Recommendation:           - Patient has a contact number available for                            emergencies. The signs and symptoms of potential                            delayed complications were discussed with the                            patient. Return to normal activities tomorrow.                            Written discharge instructions were provided to the                            patient.                           - Resume regular diet.                           - Continue present medications.                           - Await pathology results.                           - Repeat colonoscopy for surveillance based on                            pathology results. Procedure Code(s):        --- Professional ---                           [phone], Colonoscopy, flexible; with removal of                            tumor(s), polyp(s), or other lesion(s) by snare                            technique                           73532, 59, Colonoscopy, flexible; with biopsy,                             single or multiple Diagnosis Code(s):        --- Professional ---  Z12.11, Encounter for screening for malignant                            neoplasm of colon                           K63.5, Polyp of colon                           K63.3, Ulcer of intestine CPT copyright 2019 [employer]. All rights reserved. The codes documented in this report are preliminary and upon coder review may  be revised to meet current compliance requirements. Ronnette Juniper, MD 06/16/2020 11:53:35 AM This report has been signed electronically. Number of Addenda: 0

## 2020-06-16 NOTE — Op Note (Signed)
[facility] Patient Name: Meghan Lynn Procedure Date : 06/16/2020 MRN: [id number] Attending MD: Ronnette Juniper , MD Date of Birth: 21-Mar-1951 CSN: [id number] Age: 69 Admit Type: Inpatient Procedure:                Upper GI endoscopy Indications:              For therapy of pyloric stenosis Providers:                Ronnette Juniper, MD, Angus Seller, Theodora Blow,                            Technician Referring MD:             Grant Fontana, MD Medicines:                Monitored Anesthesia Care Complications:            No immediate complications. Estimated blood loss:                            Minimal. Estimated Blood Loss:     Estimated blood loss was minimal. Procedure:                Pre-Anesthesia Assessment:                           - Prior to the procedure, a History and Physical                            was performed, and patient medications and                            allergies were reviewed. The patient's tolerance of                            previous anesthesia was also reviewed. The risks                            and benefits of the procedure and the sedation                            options and risks were discussed with the patient.                            All questions were answered, and informed consent                            was obtained. Prior Anticoagulants: The patient has                            taken no previous anticoagulant or antiplatelet                            agents. ASA Grade Assessment: III - A patient with  severe systemic disease. After reviewing the risks                            and benefits, the patient was deemed in                            satisfactory condition to undergo the procedure.                           After obtaining informed consent, the endoscope was                            passed under direct vision. Throughout the                            procedure, the patient's  blood pressure, pulse, and                            oxygen saturations were monitored continuously. The                            [id number]-H190 ([id number]) Olympus gastroscope was                            introduced through the mouth, and advanced to the                            second part of duodenum. The upper GI endoscopy was                            accomplished without difficulty. The patient                            tolerated the procedure well. Scope In: Scope Out: Findings:      The examined esophagus was normal.      The Z-line was regular and was found 35 cm from the incisors.      Diffuse moderately erythematous mucosa without bleeding was found in the       entire examined stomach. Biopsies were taken with a cold forceps for       Helicobacter pylori testing.      The cardia and gastric fundus were normal on retroflexion.      A small amount of food (residue) was found in the gastric fundus.      A benign-appearing, intrinsic severe stenosis was found at the pylorus.       This could not be traversed with an adult gastroscope(9.2 mm diameter).       A TTS dilator was passed through the scope. Dilation with a 12-13.5-15       mm pyloric balloon dilator was performed, with 12, 13.5 and then 15 mm       balloon each for 1 minutes. The dilation site was examined following       endoscope reinsertion and showed moderate mucosal disruption and       complete resolution of luminal narrowing. The scope could thereafter be  advanced into the duodenal bulb.      The examined duodenum was normal. Impression:               - Normal esophagus.                           - Z-line regular, 35 cm from the incisors.                           - Erythematous mucosa in the stomach. Biopsied.                           - A small amount of food (residue) in the stomach.                           - Gastric stenosis was found at the pylorus.                            Dilated.                            - Normal examined duodenum. Moderate Sedation:      Patient did not receive moderate sedation for this procedure, but       instead received monitored anesthesia care. Recommendation:           - Patient has a contact number available for                            emergencies. The signs and symptoms of potential                            delayed complications were discussed with the                            patient. Return to normal activities tomorrow.                            Written discharge instructions were provided to the                            patient.                           - Resume regular diet.                           - Continue present medications.                           - Await pathology results. Procedure Code(s):        --- Professional ---                           [phone], Esophagogastroduodenoscopy, flexible,                            transoral; with dilation of  gastric/duodenal                            stricture(s) (eg, balloon, bougie)                           43239, 9, Esophagogastroduodenoscopy, flexible,                            transoral; with biopsy, single or multiple Diagnosis Code(s):        --- Professional ---                           K31.89, Other diseases of stomach and duodenum                           K31.1, Adult hypertrophic pyloric stenosis CPT copyright 2019 American [employer] Association. All rights reserved. The codes documented in this report are preliminary and upon coder review may  be revised to meet current compliance requirements. Ronnette Juniper, MD 06/16/2020 11:51:00 AM This report has been signed electronically. Number of Addenda: 0

## 2020-06-16 NOTE — Anesthesia Postprocedure Evaluation (Signed)
Anesthesia Post Note  Patient: Meghan Lynn  Procedure(s) Performed: COLONOSCOPY WITH PROPOFOL (N/A ) ESOPHAGOGASTRODUODENOSCOPY (EGD) WITH PROPOFOL (N/A ) BALLOON DILATION (N/A ) BIOPSY POLYPECTOMY     Patient location during evaluation: Endoscopy Anesthesia Type: General Level of consciousness: awake Pain management: pain level controlled Vital Signs Assessment: post-procedure vital signs reviewed and stable Respiratory status: spontaneous breathing Cardiovascular status: stable Postop Assessment: no apparent nausea or vomiting Anesthetic complications: no   No complications documented.  Last Vitals:  Vitals:   06/16/20 1210 06/16/20 1220  BP: (!) 139/95 (!) 150/85  Pulse: 95 87  Resp: 16 15  Temp:    SpO2: 100% 97%    Last Pain:  Vitals:   06/16/20 1220  TempSrc:   PainSc: 0-No pain                 Sukaina Toothaker

## 2020-06-16 NOTE — Brief Op Note (Signed)
06/16/2020  11:53 AM  PATIENT:  Meghan Lynn  69 y.o. female  PRE-OPERATIVE DIAGNOSIS:  Screening, Pyloric stenosis  POST-OPERATIVE DIAGNOSIS:  diffuse gastritis, pyloric stenosis, balloon dilation 12,13.5,15 Colon: polyps removed in ascending colon with hot snare, cecal ulcer, biopsied  PROCEDURE:  Procedure(s): COLONOSCOPY WITH PROPOFOL (N/A) ESOPHAGOGASTRODUODENOSCOPY (EGD) WITH PROPOFOL (N/A) BALLOON DILATION (N/A) BIOPSY POLYPECTOMY  SURGEON:  Surgeon(s) and Role:    Ronnette Juniper, MD - Primary  PHYSICIAN ASSISTANT:   ASSISTANTS:Shanice Antionette Poles, RN, Theodora Blow, Tech  ANESTHESIA:   MAC  EBL:  Minimal  BLOOD ADMINISTERED:none  DRAINS: none   LOCAL MEDICATIONS USED:  NONE  SPECIMEN:  Biopsy / Limited Resection  DISPOSITION OF SPECIMEN:  PATHOLOGY  COUNTS:  YES  TOURNIQUET:  * No tourniquets in log *  DICTATION: .Dragon Dictation  PLAN OF CARE: Discharge to home after PACU  PATIENT DISPOSITION:  PACU - hemodynamically stable.   Delay start of Pharmacological VTE agent (>24hrs) due to surgical blood loss or risk of bleeding: not applicable

## 2020-06-16 NOTE — H&P (Signed)
History of Present Illness  General:  69 year old female, previous patient of Dr. Amedeo Plenty, with pyloric stenosis. EGD was last performed in 2017 with balloon dilation of pyloric stenosis with 10/11 and 20 mm balloon. EGD performed in 2014 for nausea, vomiting, suspected pyloric stenosis, balloon dilation performed. In 2009 she had a balloon dilation up to 15 MM of pyloric stenosis. Last colonoscopy was in 2008. She has been doing ok, she has been gaining weight in her "stomach" abdomen, she feels distended, she eats very small 1-2 meals a day, she takes a laxative and doesn't "empty" well. She drinks a lot of fluids when eating so her "food goes down", she avoids peanuts and broccoli, at times, it feels stuck in upper abdomen and feels full, she continues to drink and she burps and "regurgitates and coughs " food out. She has a BM every day, mostly in the morning, not a "full movement", moderately soft, sometimes later in the evening she may have another one, she takes OTC miralax or stool softner. There is no family history of colon cancer. She denies acid reflux or heartburn.     Current Medications  Taking   Aciphex(RABEprazole Sodium) 20 MG Tablet Delayed Release 1 tablet Orally twice a day   Aspirin 81 MG Tablet Delayed Release 1 tablet Orally Once a day   MVI 1 tab Oral daily   Niaspan(Niacin ER (Antihyperlipidemic)) 750 MG Tablet Extended Release 1 tablet at bedtime Orally Once a day   Valsartan 320 MG Tablet 1 tablet Orally Once a day   Not-Taking   GaviLyte-N with Flavor Pack(PEG 3350-KCl-Na Bicarb-NaCl) 420 GM Solution Reconstituted 1069ml Orally as directed   Dulcolax(Docusate Sodium) 5 MG Tablet Delayed Release 4 tablets Orally Once a day   Atorvastatin Calcium 20 MG Tablet 1 tablet Orally Once a day   Diovan(Valsartan) 320 MG Tablet 1 tablet Orally Once a day   Metoprolol Tartrate 25 mg Tablet 1 tablet Orally once a day   Medication List reviewed and reconciled with the  patient    Past Medical History  Chest pain.   Headache.   Hypertension.   Abdominal pain.   Reflux.   Dysphagia.   Peptic ulcer disease.   Asthma.   Hemoptysis.   Irregular heart beat.   Bronchitis.   Coronary artery disease.   Depression.   Gastritis.    Surgical History  cardiac stent 2004  tubal ligation   elbow surgery, right 2011  EGD 07/1995,11/2007,01/2008  back surgery 12/2012   Family History  Father: deceased, Bone cancer, diagnosed with Hypertension  Mother: deceased, Colon polyps, Diabetes  2 brother(s) , 4 sister(s) . 3daughter(s) .   neg for colon cancer or liver disease.   Social History  General:  Tobacco use  cigarettes: Current smoker Tobacco history last updated 04/11/2020 EXPOSURE TO PASSIVE SMOKE: yes, 1 PPD.  Alcohol: yes, Rare, beer, 1-2 per month or year.  no Recreational drug use.    Allergies  Darvon: GI upset - Allergy   Hospitalization/Major Diagnostic Procedure  back surgery 12/2012  not in past yr 04/2020   Review of Systems  GI PROCEDURE:  no Pacemaker/ AICD, no. no Artificial heart valves. no MI/heart attack. no Abnormal heart rhythm. no Angina. no CVA. Hypertension YES. no Hypotension. no Asthma, COPD. no Sleep apnea. no Seizure disorders. no Artificial joints. no Severe DJD. no Diabetes. no Significant headaches. no Vertigo. no Depression/anxiety. no Abnormal bleeding. no Kidney Disease. no Liver disease, no. no Blood transfusion.  Vital Signs  Wt 154, Wt change 10.8 lb, Ht 60, BMI 30.07, Temp 97.7, Pulse sitting 82, BP sitting 172/100 l arm, Repeat BP 162/92 l arm Wt per pt/HL,CMA.   Examination  Gastroenterology:: GENERAL APPEARANCE: Well developed, well nourished, no active distress, pleasant.  SCLERA: anicteric.  CARDIOVASCULAR PMI LS border. Normal RRR w/o murmers or gallops. No peripheral edema.  RESPIRATORY Breath sounds normal. Respiration even and unlabored.  ABDOMEN No masses palpated.  Liver and spleen not palpated, normal. Bowel sounds normal, Abdomen distended.  EXTREMITIES: No edema.  NEURO: alert,oriented to time,place and person,normal gait.  PSYCH: mood/affect normal.     Assessments     1. Pyloric stenosis in adult - K31.1 (Primary)   2. Screen for colon cancer - Z12.11   3. Abdominal distension - R14.0   4. Weight gain - R63.5   Treatment  1. Pyloric stenosis in adult  IMAGING: EGD w/ DILATATION IMAGING: CT ABD PEL WTH IV CON Notes: Recommend diagnostic EGD with balloon dilation of pylorus. The risks and the benefits of the procedure were discussed with the patient in details.  She understands and verbalizes consent.    2. Screen for colon cancer  IMAGING: Colonoscopy Notes: The risks and the benefits of the procedure were discussed with the patient in details.  She understands and verbalizes consent.  She will be given written instructions,prescription for preparation and will be scheduled for the same.    3. Abdominal distension  IMAGING: CT ABD PEL WTH IV CON Notes: SHe has gained 30 lbs in the past 1-2 years and is very worried about abdominal distension. WIll get a CT of abdomen and pelvis to r/o ascites or other cause of abdominal distension and weight gain(very unsual in a patient with pyloric stenosis and she takes small meals).    4. Weight gain  IMAGING: CT ABD PEL WTH IV CON Notes: Will evaluate with CT as it is unusual for her to gain wieght wih pyloric stenosis and consumption of small meals. R/O ascites.

## 2020-06-16 NOTE — Discharge Instructions (Signed)

## 2020-06-17 LAB — SURGICAL PATHOLOGY

## 2020-06-20 ENCOUNTER — Encounter (HOSPITAL_COMMUNITY): Payer: Self-pay

## 2020-06-20 ENCOUNTER — Other Ambulatory Visit: Payer: Self-pay

## 2020-06-20 ENCOUNTER — Inpatient Hospital Stay (HOSPITAL_COMMUNITY)
Admission: EM | Admit: 2020-06-20 | Discharge: 2020-06-22 | DRG: 920 | Disposition: A | Discharge status: Home / Self Care | Payer: Medicare HMO | Attending: Internal Medicine | Admitting: Internal Medicine

## 2020-06-20 DIAGNOSIS — Z79899 Other long term (current) drug therapy: Secondary | ICD-10-CM

## 2020-06-20 DIAGNOSIS — K922 Gastrointestinal hemorrhage, unspecified: Secondary | ICD-10-CM | POA: Diagnosis not present

## 2020-06-20 DIAGNOSIS — E785 Hyperlipidemia, unspecified: Secondary | ICD-10-CM | POA: Diagnosis present

## 2020-06-20 DIAGNOSIS — Z83438 Family history of other disorder of lipoprotein metabolism and other lipidemia: Secondary | ICD-10-CM

## 2020-06-20 DIAGNOSIS — Z20822 Contact with and (suspected) exposure to covid-19: Secondary | ICD-10-CM | POA: Diagnosis not present

## 2020-06-20 DIAGNOSIS — M1990 Unspecified osteoarthritis, unspecified site: Secondary | ICD-10-CM | POA: Diagnosis present

## 2020-06-20 DIAGNOSIS — K648 Other hemorrhoids: Secondary | ICD-10-CM | POA: Diagnosis present

## 2020-06-20 DIAGNOSIS — D649 Anemia, unspecified: Secondary | ICD-10-CM

## 2020-06-20 DIAGNOSIS — D72829 Elevated white blood cell count, unspecified: Secondary | ICD-10-CM | POA: Diagnosis present

## 2020-06-20 DIAGNOSIS — I2510 Atherosclerotic heart disease of native coronary artery without angina pectoris: Secondary | ICD-10-CM | POA: Diagnosis present

## 2020-06-20 DIAGNOSIS — Z91040 Latex allergy status: Secondary | ICD-10-CM

## 2020-06-20 DIAGNOSIS — D62 Acute posthemorrhagic anemia: Secondary | ICD-10-CM | POA: Diagnosis present

## 2020-06-20 DIAGNOSIS — F17210 Nicotine dependence, cigarettes, uncomplicated: Secondary | ICD-10-CM | POA: Diagnosis present

## 2020-06-20 DIAGNOSIS — D123 Benign neoplasm of transverse colon: Secondary | ICD-10-CM | POA: Diagnosis present

## 2020-06-20 DIAGNOSIS — Z8711 Personal history of peptic ulcer disease: Secondary | ICD-10-CM

## 2020-06-20 DIAGNOSIS — K91840 Postprocedural hemorrhage and hematoma of a digestive system organ or structure following a digestive system procedure: Secondary | ICD-10-CM | POA: Diagnosis not present

## 2020-06-20 DIAGNOSIS — R0602 Shortness of breath: Secondary | ICD-10-CM | POA: Diagnosis not present

## 2020-06-20 DIAGNOSIS — D124 Benign neoplasm of descending colon: Secondary | ICD-10-CM | POA: Diagnosis present

## 2020-06-20 DIAGNOSIS — Z885 Allergy status to narcotic agent status: Secondary | ICD-10-CM

## 2020-06-20 DIAGNOSIS — D125 Benign neoplasm of sigmoid colon: Secondary | ICD-10-CM | POA: Diagnosis present

## 2020-06-20 DIAGNOSIS — Z8701 Personal history of pneumonia (recurrent): Secondary | ICD-10-CM

## 2020-06-20 DIAGNOSIS — G43909 Migraine, unspecified, not intractable, without status migrainosus: Secondary | ICD-10-CM | POA: Diagnosis present

## 2020-06-20 DIAGNOSIS — Y838 Other surgical procedures as the cause of abnormal reaction of the patient, or of later complication, without mention of misadventure at the time of the procedure: Secondary | ICD-10-CM | POA: Diagnosis present

## 2020-06-20 DIAGNOSIS — Z7982 Long term (current) use of aspirin: Secondary | ICD-10-CM

## 2020-06-20 DIAGNOSIS — Z86010 Personal history of colonic polyps: Secondary | ICD-10-CM

## 2020-06-20 DIAGNOSIS — K2950 Unspecified chronic gastritis without bleeding: Secondary | ICD-10-CM | POA: Diagnosis present

## 2020-06-20 DIAGNOSIS — Z8249 Family history of ischemic heart disease and other diseases of the circulatory system: Secondary | ICD-10-CM

## 2020-06-20 DIAGNOSIS — Z9861 Coronary angioplasty status: Secondary | ICD-10-CM

## 2020-06-20 DIAGNOSIS — K625 Hemorrhage of anus and rectum: Secondary | ICD-10-CM | POA: Diagnosis not present

## 2020-06-20 DIAGNOSIS — K633 Ulcer of intestine: Secondary | ICD-10-CM | POA: Diagnosis present

## 2020-06-20 DIAGNOSIS — K219 Gastro-esophageal reflux disease without esophagitis: Secondary | ICD-10-CM | POA: Diagnosis present

## 2020-06-20 DIAGNOSIS — J45909 Unspecified asthma, uncomplicated: Secondary | ICD-10-CM | POA: Diagnosis present

## 2020-06-20 DIAGNOSIS — I10 Essential (primary) hypertension: Secondary | ICD-10-CM | POA: Diagnosis present

## 2020-06-20 HISTORY — DX: Gastrointestinal hemorrhage, unspecified: K92.2

## 2020-06-20 LAB — CBC WITH DIFFERENTIAL/PLATELET
Abs Immature Granulocytes: 0 10*3/uL (ref 0.00–0.07)
Basophils Absolute: 0.3 10*3/uL — ABNORMAL HIGH (ref 0.0–0.1)
Basophils Relative: 2 %
Eosinophils Absolute: 0 10*3/uL (ref 0.0–0.5)
Eosinophils Relative: 0 %
HCT: 31.2 % — ABNORMAL LOW (ref 36.0–46.0)
Hemoglobin: 9.9 g/dL — ABNORMAL LOW (ref 12.0–15.0)
Lymphocytes Relative: 41 %
Lymphs Abs: 6.6 10*3/uL — ABNORMAL HIGH (ref 0.7–4.0)
MCH: 25.2 pg — ABNORMAL LOW (ref 26.0–34.0)
MCHC: 31.7 g/dL (ref 30.0–36.0)
MCV: 79.4 fL — ABNORMAL LOW (ref 80.0–100.0)
Monocytes Absolute: 0.2 10*3/uL (ref 0.1–1.0)
Monocytes Relative: 1 %
Neutro Abs: 9 10*3/uL — ABNORMAL HIGH (ref 1.7–7.7)
Neutrophils Relative %: 56 %
Platelets: 217 10*3/uL (ref 150–400)
RBC: 3.93 MIL/uL (ref 3.87–5.11)
RDW: 14 % (ref 11.5–15.5)
WBC: 16.1 10*3/uL — ABNORMAL HIGH (ref 4.0–10.5)
nRBC: 0 % (ref 0.0–0.2)
nRBC: 0 /100 WBC

## 2020-06-20 LAB — CBC
HCT: 34.4 % — ABNORMAL LOW (ref 36.0–46.0)
Hemoglobin: 10.8 g/dL — ABNORMAL LOW (ref 12.0–15.0)
MCH: 25.3 pg — ABNORMAL LOW (ref 26.0–34.0)
MCHC: 31.4 g/dL (ref 30.0–36.0)
MCV: 80.6 fL (ref 80.0–100.0)
Platelets: 228 10*3/uL (ref 150–400)
RBC: 4.27 MIL/uL (ref 3.87–5.11)
RDW: 14.3 % (ref 11.5–15.5)
WBC: 12.7 10*3/uL — ABNORMAL HIGH (ref 4.0–10.5)
nRBC: 0 % (ref 0.0–0.2)

## 2020-06-20 LAB — COMPREHENSIVE METABOLIC PANEL
ALT: 22 U/L (ref 0–44)
AST: 25 U/L (ref 15–41)
Albumin: 3 g/dL — ABNORMAL LOW (ref 3.5–5.0)
Alkaline Phosphatase: 52 U/L (ref 38–126)
Anion gap: 8 (ref 5–15)
BUN: 13 mg/dL (ref 8–23)
CO2: 25 mmol/L (ref 22–32)
Calcium: 8.9 mg/dL (ref 8.9–10.3)
Chloride: 107 mmol/L (ref 98–111)
Creatinine, Ser: 0.84 mg/dL (ref 0.44–1.00)
GFR calc Af Amer: >60 mL/min (ref >60)
GFR calc non Af Amer: >60 mL/min (ref >60)
Glucose, Bld: 141 mg/dL — ABNORMAL HIGH (ref 70–99)
Potassium: 3.9 mmol/L (ref 3.5–5.1)
Sodium: 140 mmol/L (ref 135–145)
Total Bilirubin: 0.3 mg/dL (ref 0.3–1.2)
Total Protein: 5.3 g/dL — ABNORMAL LOW (ref 6.5–8.1)

## 2020-06-20 LAB — SARS CORONAVIRUS 2 BY RT PCR (HOSPITAL ORDER, PERFORMED IN ~~LOC~~ HOSPITAL LAB): SARS Coronavirus 2: NEGATIVE

## 2020-06-20 LAB — POC OCCULT BLOOD, ED: Fecal Occult Bld: POSITIVE — AB

## 2020-06-20 MED ORDER — PEG 3350-KCL-NA BICARB-NACL 420 G PO SOLR
4000.0000 mL | Freq: Once | ORAL | Status: AC
  Administered 2020-06-20: 4000 mL via ORAL
  Filled 2020-06-20: qty 4000

## 2020-06-20 MED ORDER — LACTATED RINGERS IV SOLN: INTRAVENOUS | Status: AC

## 2020-06-20 MED ORDER — DEXTROSE IN LACTATED RINGERS 5 % IV SOLN: INTRAVENOUS | Status: DC

## 2020-06-20 NOTE — H&P (Signed)
Date: 06/20/2020               Patient Name:  Meghan Lynn MRN: [id number]  DOB: 21-Aug-1951 Age / Sex: 69 y.o., female   PCP: Rutherford Guys, MD         Medical Service: Internal Medicine Teaching Service         Attending Physician: Dr. Blanchie Dessert, MD    First Contact: Marianna Payment, DO, [facility] Pager: [facility] [phone])  Second Contact: Sharon Seller, DO, Jaimie PagerJari Pigg [phone])       After Hours (After 5p/  First Contact Pager: [phone]  weekends / holidays): Second Contact Pager: [phone]   Chief Complaint: Rectal bleeding   History of Present Illness:   Meghan Lynn is a 69 y.o. female with a pertinent PMH of tobacco-use disorder, CAD (s/p 3x in RCA), GERD, HLD, PUD, who presented with a 4 day history of bright red blood per rectum.   She first noted a small amount of blood since her colonoscopy and then it was increasing more and more. It was occurring even when she wasn't using the bathroom. She would feel like she had to defecate but only blood would come out, which has occurred repeatedly.   Then she started feeling short of breath, dizzy, and had feelings of palpitations. Even going to shower or moving around in one room her chest will get heavy and she'll feel fatigued. She has had some nausea. She treated this with her protonix, which only mildly improved her symptoms. She denies symptoms of reflux.   No one around her has been ill. She hasn't been on aspirin since before the colonoscopy. She has had her covid vaccinations. She denies any recent changes in her medications.  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 This was just done last week as well.   She rarely drinks, maybe every six months. She smokes about a pack a day. No recreational drug use. She is craving a cigarette now but does not want nicotine patch as it makes her heart race. Chantix does help her but it raises her blood pressure a lot twice now.     ED  Course:  In the ED patient was found to have a hemoglobin of 10.6 which is almost a five-point decrease from her baseline at 16.   Lab Orders     SARS Coronavirus 2 by RT PCR (hospital order, performed in [facility] hospital lab) Nasopharyngeal Nasopharyngeal Swab     Comprehensive metabolic panel     CBC     POC occult blood, ED     POC occult blood, ED   Meds:  Current Meds  Medication Sig  . aspirin EC 81 MG tablet Take 81 mg by mouth daily.   . Aspirin-Acetaminophen-Caffeine (GOODY HEADACHE PO) Take 1 Package by mouth as needed (headache).  . Biotin 1 MG CAPS Take 1 mg by mouth daily.   [name] Kitchen ibuprofen (ADVIL) 200 MG tablet Take 400 mg by mouth every 6 (six) hours as needed for moderate pain.  . metoprolol succinate (TOPROL-XL) 25 MG 24 hr tablet TAKE 1 TABLET BY MOUTH EVERY DAY (Patient taking differently: Take 25 mg by mouth daily. )  . Multiple Vitamin (MULTIVITAMIN WITH MINERALS) TABS Take 1 tablet by mouth daily.  [name] Kitchen omeprazole (PRILOSEC) 20 MG capsule Take 20 mg by mouth daily.  . valsartan (DIOVAN) 320 MG tablet TAKE 1 TABLET BY MOUTH EVERY DAY (Patient taking  differently: Take 320 mg by mouth daily. )    Social:  Social History   Socioeconomic History  . Marital status: Widowed    Spouse name: Not on file  . Number of children: 3  . Years of education: 59  . Highest education level: Not on file  Occupational History    Employer: [employer]  Tobacco Use  . Smoking status: Current Every Day Smoker    Packs/day: 0.60    Years: 45.00    Pack years: 27.00    Types: Cigarettes  . Smokeless tobacco: Never Used  Vaping Use  . Vaping Use: Never used  Substance and Sexual Activity  . Alcohol use: Yes    Alcohol/week: 0.0 standard drinks    Comment: occ  . Drug use: No  . Sexual activity: Yes    Birth control/protection: Abstinence  Other Topics Concern  . Not on file  Social History Narrative   Epworth Sleepiness scale score: 5   Family History:  Family  History  Problem Relation Age of Onset  . Diabetes Mother   . CAD Mother        CABG  . Bone cancer Father   . Hypertension Father   . Heart disease Brother   . Kidney disease Sister   . Cancer Sister   . Diabetes Sister   . Hyperlipidemia Sister   . Hypertension Sister    Allergies: Allergies as of 06/20/2020 - Review Complete 06/20/2020  Allergen Reaction Noted  . Propoxyphene Nausea And Vomiting 04/21/2016  . Latex Itching 04/21/2016   Past Medical History:  Diagnosis Date  . Active smoker   . Anginal pain ([place])   . Arthritis   . Asthma    patient denies  . Bronchitis due to tobacco use   . Burning sensation of feet   . Complication of anesthesia 2017   "Mouth was swollen, neck swollen, black and blue, fInger prints in neck."  . Coronary artery disease    3 stents to RCA  . Dilatation of esophagus   . Gastritis   . GERD (gastroesophageal reflux disease)   . 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 Headache, migraine    daily  . Hyperlipidemia   . Hypertension   . Peptic ulcer disease   . Pneumonia   . Shortness of breath dyspnea    with exertion     Review of Systems: A complete ROS was negative except as per HPI.   Physical Exam: Blood pressure 121/82, pulse 99, temperature 98.7 F (37.1 C), temperature source Oral, resp. rate 20, height 5\' 1"  (1.549 m), weight 69.4 kg, SpO2 100 %. Physical Exam Constitutional:      Appearance: Normal appearance.  HENT:     Head: Normocephalic and atraumatic.  Eyes:     Extraocular Movements: Extraocular movements intact.  Cardiovascular:     Rate and Rhythm: Tachycardia present.     Pulses: Normal pulses.     Heart sounds: Normal heart sounds.  Pulmonary:     Effort: Pulmonary effort is normal.     Breath sounds: Normal breath sounds.  Abdominal:     General: Bowel sounds are normal. There is distension.     Palpations: Abdomen is soft.     Tenderness: There is no abdominal tenderness.  Musculoskeletal:          General: Normal range of motion.     Cervical back: Normal range of motion.     Right lower leg: No  edema.     Left lower leg: No edema.  Skin:    General: Skin is warm and dry.     Capillary Refill: Capillary refill takes 2 to 3 seconds.  Neurological:     Mental Status: She is alert and oriented to person, place, and time. Mental status is at baseline.  Psychiatric:        Mood and Affect: Mood normal.      Labs: CBC    Component Value Date/Time   WBC 12.7 (H) 06/20/2020 1120   RBC 4.27 06/20/2020 1120   HGB 10.8 (L) 06/20/2020 1120   HCT 34.4 (L) 06/20/2020 1120   PLT 228 06/20/2020 1120   MCV 80.6 06/20/2020 1120   MCV 84.7 01/22/2014 0905   MCH 25.3 (L) 06/20/2020 1120   MCHC 31.4 06/20/2020 1120   RDW 14.3 06/20/2020 1120   LYMPHSABS 3.0 01/18/2013 0910   MONOABS 0.6 01/18/2013 0910   EOSABS 0.0 01/18/2013 0910   BASOSABS 0.1 01/18/2013 0910     CMP     Component Value Date/Time   NA 140 06/20/2020 1120   NA 141 01/31/2020 1009   K 3.9 06/20/2020 1120   CL 107 06/20/2020 1120   CO2 25 06/20/2020 1120   GLUCOSE 141 (H) 06/20/2020 1120   BUN 13 06/20/2020 1120   BUN 12 01/31/2020 1009   CREATININE 0.84 06/20/2020 1120   CREATININE 0.94 01/03/2017 1228   CALCIUM 8.9 06/20/2020 1120   PROT 5.3 (L) 06/20/2020 1120   PROT 6.9 01/31/2020 1009   ALBUMIN 3.0 (L) 06/20/2020 1120   ALBUMIN 3.9 01/31/2020 1009   AST 25 06/20/2020 1120   ALT 22 06/20/2020 1120   ALKPHOS 52 06/20/2020 1120   BILITOT 0.3 06/20/2020 1120   BILITOT <0.2 01/31/2020 1009   GFRNONAA >60 06/20/2020 1120   GFRAA >60 06/20/2020 1120    Imaging:  Colonoscopy (06/16/20): - One 8 mm polyp in the ascending colon, removed with a hot snare. Resected and retrieved. - Ulcerated mucosa in the cecum. Biopsied. - The examination was otherwise normal on direct and retroflexion views.  EGD (06/16/20): - Normal esophagus. - Z-line regular, 35 cm from the incisors. - Erythematous mucosa  in the stomach. Biopsied. - A small amount of food (residue) in the stomach. - Gastric stenosis was found at the pylorus. Dilated. - Normal examined duodenum.  A benign-appearing, intrinsic severe stenosis was found at the pylorus. This could not be traversed with an adult gastroscope(9.2 mm diameter). A TTS dilator was passed through the scope. Dilation with a 12- 13.5-15 mm pyloric balloon dilator was performed, with 12, 13.5 and then 15 mm balloon each for 1 minutes. The dilation site was examined following endoscope reinsertion and showed moderate mucosal disruption and complete resolution of luminal narrowing. The scope could thereafter be advanced into the duodenal bulb.  CT Abdomen w/ contrast (04/28/2020) No acute findings or other significant abnormality within the abdomen or pelvis. Aortic Atherosclerosis (ICD10-I70.0).  EKG: personally reviewed my interpretation is sinus rhythm   Assessment & Plan by Problem: Active Problems:   * No active hospital problems. *   Meghan Lynn is a 69 y.o. with pertinent PMH of tobacco-use disorder, CAD (s/p 3x in RCA), GERD, HLD, PUD who presented with a 4 day hx. of BRBPR with associated shortness of breath, fatigue, and dizziness s/p colonoscopy and admit for GI bleed with symptomatic anemia on hospital day 0  1. GI Bleed Patient presented with acute drop in hemoglobin  from 16 to 10.8 in the setting of bright red blood per rectum.  She does admit to experiencing signs and symptoms of anemia.  - GI consulted in the ED and plan to perform colonoscopy tomorrow morning. - NPO. today and start prep for colonoscopy tomorrow. - Patient states that she would not want a blood transfusion. She states that she would really only consider getting one if it would a matter of life and death.  - Will get a ferritin level and reticulocyte count with AM labs tomorrow.  - We will give iron transfusion Marisue Brooklyn) today. - Will repeat CBC this evening to  trend Hgb level.  - Start LR with D5 at 50 cc/hr  2. CAD s/p x3 DES RCA - Patient takes ASA 81 mg for 2/2 prevention. - She has not taken this since prior to her colonoscopy on Monday. Will continue to hold ASA.  3. HTN - Patient takes HCTZ 12.5 mg daily, metoprolol 25 mg daily, and valsartan 320 mg daily.  - Will hold these in the setting of acute GI bleed.  4. Leukocytosis: - Patient has no other signs of infection. - Continue to monitor.    Diet: NPO VTE: SCDs IVF: D5,50cc/hr Code: Full  Prior to Admission Living Arrangement: Home, living alone Anticipated Discharge Location: Home Barriers to Discharge: further work-up  Dispo: Admit patient to Inpatient with expected length of stay greater than 2 midnights.  Signed: Marianna Payment, MD 06/20/2020, 1:29 PM  Pager: [phone] On Call Pager: [phone]

## 2020-06-20 NOTE — Consult Note (Signed)
Referring Provider: Dr. Blanchie Dessert (ED) Primary Care Physician:  Rutherford Guys, MD Primary Gastroenterologist:  Dr. Therisa Doyne [facility])  Reason for Consultation:  Post-polypectomy bleeding  HPI: Meghan Lynn is a 69 y.o. female with past medical history noted below to include CAD and pyloric stenosis presenting with suspected post-polypectomy bleeding.  Patient underwent colonoscopy on Monday 6/21 with removal of an 37mm ascending colon polyp.  On 6/22, patient started noticing "a lot" of rectal bleeding.  She states she has been having bleeding every day since that time.  She has been having 4 to 5 bloody bowel movements per day.  She describes BMs as frank red blood mixed with dark clots.  She denies any abdominal pain, though she notes some lower abdominal cramping.     Patient has been having some shortness of breath and chest discomfort for the last day or 2, thus she presented to the ED today.  She denies any nausea, vomiting, hematemesis, dysphagia, GERD, changes in appetite, or melena.  Patient takes 81mg  aspirin daily but hasn't taken it in over 1 week.  Takes 400mg  ibuprofen approximately once per day due to right arm pain.  Denies anticoagulation use.  Records reviewed: -Colonoscopy 06/16/20: 62mm ascending colon polyp, removed (bx: tubular adenoma, no dysplasia or malignancy).  Patient was also noted to have some ulcerated mucosa in the cecum (bx: benign with reactive changes, no inflammation or microscopic colitis, no malignancy or dysplasia). Patient also went  -EGD 06/16/20: Pyloric stenosis, dilated.  Erythematous mucosa in the stomach (bx: mild chronic gastritis with reactive changes.)  Past Medical History:  Diagnosis Date  . Active smoker   . Anginal pain ([place])   . Arthritis   . Asthma    patient denies  . Bronchitis due to tobacco use   . Burning sensation of feet   . Complication of anesthesia 2017   "Mouth was swollen, neck swollen, black and blue, fInger prints  in neck."  . Coronary artery disease    3 stents to RCA  . Dilatation of esophagus   . Gastritis   . GERD (gastroesophageal reflux disease)   . 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 Headache, migraine    daily  . Hyperlipidemia   . Hypertension   . Peptic ulcer disease   . Pneumonia   . Shortness of breath dyspnea    with exertion    Past Surgical History:  Procedure Laterality Date  . BACK SURGERY    . BALLOON DILATION N/A 05/30/2013   Procedure: BALLOON DILATION;  Surgeon: Missy Sabins, MD;  Location: [facility] ENDOSCOPY;  Service: Endoscopy;  Laterality: N/A;  . BALLOON DILATION N/A 08/02/2013   Procedure: BALLOON DILATION;  Surgeon: Missy Sabins, MD;  Location: [place];  Service: Endoscopy;  Laterality: N/A;  . BALLOON DILATION N/A 04/29/2016   Procedure: BALLOON DILATION;  Surgeon: Teena Irani, MD;  Location: [facility] ENDOSCOPY;  Service: Endoscopy;  Laterality: N/A;  . BALLOON DILATION N/A 06/16/2020   Procedure: BALLOON DILATION;  Surgeon: Ronnette Juniper, MD;  Location: [place];  Service: Gastroenterology;  Laterality: N/A;  . BIOPSY  06/16/2020   Procedure: BIOPSY;  Surgeon: Ronnette Juniper, MD;  Location: [place];  Service: Gastroenterology;;  . CARDIAC CATHETERIZATION  03/07/2002   moderte CAD 80% (mid RCA) - Dr. Myrtice Lauth  . CARDIAC CATHETERIZATION  09/13/2003   Cypher 2.5x17mm and 2.5x18 and Taxus 2.75x7mm to distal, mid, prox RCA (Dr. Gerrie Nordmann)  . CARDIOLITE MYOCARDIAL PERFUSION  STUDY  05/2006   positive bruce protocol, low risk, EF 80%  . CARDIOVASCULAR STRESS TEST  2010  . COLONOSCOPY    . COLONOSCOPY WITH PROPOFOL N/A 06/16/2020   Procedure: COLONOSCOPY WITH PROPOFOL;  Surgeon: Ronnette Juniper, MD;  Location: [place];  Service: Gastroenterology;  Laterality: N/A;  . CORONARY ANGIOPLASTY  2004   stents x 3  . DILATATION & CURETTAGE/HYSTEROSCOPY WITH MYOSURE N/A 08/19/2016   Procedure: DILATATION & CURETTAGE/HYSTEROSCOPY WITH MYOSURE;  Surgeon: Servando Salina,  MD;  Location: [place] [facility];  Service: Gynecology;  Laterality: N/A;  . ESOPHAGOGASTRODUODENOSCOPY N/A 05/30/2013   Procedure: ESOPHAGOGASTRODUODENOSCOPY (EGD);  Surgeon: Missy Sabins, MD;  Location: [facility] ENDOSCOPY;  Service: Endoscopy;  Laterality: N/A;  . ESOPHAGOGASTRODUODENOSCOPY N/A 08/02/2013   Procedure: ESOPHAGOGASTRODUODENOSCOPY (EGD);  Surgeon: Missy Sabins, MD;  Location: [facility] ENDOSCOPY;  Service: Endoscopy;  Laterality: N/A;  barb Greggory [name]  . ESOPHAGOGASTRODUODENOSCOPY (EGD) WITH PROPOFOL N/A 04/29/2016   Procedure: ESOPHAGOGASTRODUODENOSCOPY (EGD) WITH PROPOFOL;  Surgeon: Teena Irani, MD;  Location: [facility] ENDOSCOPY;  Service: Endoscopy;  Laterality: N/A;  . ESOPHAGOGASTRODUODENOSCOPY (EGD) WITH PROPOFOL N/A 06/16/2020   Procedure: ESOPHAGOGASTRODUODENOSCOPY (EGD) WITH PROPOFOL;  Surgeon: Ronnette Juniper, MD;  Location: [place];  Service: Gastroenterology;  Laterality: N/A;  . FRACTURE SURGERY    . ORIF ELBOW FRACTURE  2006  . POLYPECTOMY  06/16/2020   Procedure: POLYPECTOMY;  Surgeon: Ronnette Juniper, MD;  Location: [facility] ENDOSCOPY;  Service: Gastroenterology;;  . TRANSTHORACIC ECHOCARDIOGRAM  07/2008   normal LV function, mild MR, mild TR, trace pulm valve regurg    Prior to Admission medications   Medication Sig Start Date End Date Taking? Authorizing Provider  aspirin EC 81 MG tablet Take 81 mg by mouth daily.    Yes [provider]  Aspirin-Acetaminophen-Caffeine (GOODY HEADACHE PO) Take 1 Package by mouth as needed (headache).   Yes [provider]  Biotin 1 MG CAPS Take 1 mg by mouth daily.    Yes [provider]  ibuprofen (ADVIL) 200 MG tablet Take 400 mg by mouth every 6 (six) hours as needed for moderate pain.   Yes [provider]  metoprolol succinate (TOPROL-XL) 25 MG 24 hr tablet TAKE 1 TABLET BY MOUTH EVERY DAY Patient taking differently: Take 25 mg by mouth daily.  12/17/19  Yes Hilty, Nadean Corwin, MD  Multiple Vitamin (MULTIVITAMIN WITH MINERALS) TABS Take 1  tablet by mouth daily.   Yes [provider]  omeprazole (PRILOSEC) 20 MG capsule Take 20 mg by mouth daily.   Yes [provider]  valsartan (DIOVAN) 320 MG tablet TAKE 1 TABLET BY MOUTH EVERY DAY Patient taking differently: Take 320 mg by mouth daily.  03/14/20  Yes Hilty, Nadean Corwin, MD  fluconazole (DIFLUCAN) 150 MG tablet Take 1 tablet (150 mg total) by mouth daily. Patient not taking: Reported on 05/13/2020 03/13/20   Wendall Mola, NP  hydrochlorothiazide (HYDRODIURIL) 12.5 MG tablet Take 1 tablet (12.5 mg total) by mouth daily. Patient not taking: Reported on 05/13/2020 12/26/19   Posey Boyer, MD    Scheduled Meds: . polyethylene glycol-electrolytes  4,000 mL Oral Once   Continuous Infusions: PRN Meds:.  Allergies as of 06/20/2020 - Review Complete 06/20/2020  Allergen Reaction Noted  . Propoxyphene Nausea And Vomiting 04/21/2016  . Latex Itching 04/21/2016    Family History  Problem Relation Age of Onset  . Diabetes Mother   . CAD Mother        CABG  . Bone cancer Father   .  Hypertension Father   . Heart disease Brother   . Kidney disease Sister   . Cancer Sister   . Diabetes Sister   . Hyperlipidemia Sister   . Hypertension Sister     Social History   Socioeconomic History  . Marital status: Widowed    Spouse name: Not on file  . Number of children: 3  . Years of education: 70  . Highest education level: Not on file  Occupational History    Employer: [employer]  Tobacco Use  . Smoking status: Current Every Day Smoker    Packs/day: 0.60    Years: 45.00    Pack years: 27.00    Types: Cigarettes  . Smokeless tobacco: Never Used  Vaping Use  . Vaping Use: Never used  Substance and Sexual Activity  . Alcohol use: Yes    Alcohol/week: 0.0 standard drinks    Comment: occ  . Drug use: No  . Sexual activity: Yes    Birth control/protection: Abstinence  Other Topics Concern  . Not on file  Social History Narrative   Epworth  Sleepiness scale score: 5   Social Determinants of Health   Financial Resource Strain:   . Difficulty of Paying Living Expenses:   Food Insecurity:   . Worried About Charity fundraiser in the Last Year:   . Arboriculturist in the Last Year:   Transportation Needs:   . Film/video editor (Medical):   [name] Kitchen Lack of Transportation (Non-Medical):   Physical Activity:   . Days of Exercise per Week:   . Minutes of Exercise per Session:   Stress:   . Feeling of Stress :   Social Connections:   . Frequency of Communication with Friends and Family:   . Frequency of Social Gatherings with Friends and Family:   . Attends Religious Services:   . Active Member of Clubs or Organizations:   . Attends Archivist Meetings:   [name] Kitchen Marital Status:   Intimate Partner Violence:   . Fear of Current or Ex-Partner:   . Emotionally Abused:   [name] Kitchen Physically Abused:   . Sexually Abused:     Review of Systems: Review of Systems  Constitutional: Positive for malaise/fatigue. Negative for chills, fever and weight loss.  HENT: Negative for hearing loss and tinnitus.   Eyes: Negative for pain and redness.  Respiratory: Positive for shortness of breath. Negative for cough.   Cardiovascular: Positive for chest pain. Negative for palpitations.  Gastrointestinal: Positive for blood in stool. Negative for abdominal pain, constipation, diarrhea, heartburn, melena, nausea and vomiting.  Genitourinary: Negative for flank pain and hematuria.  Musculoskeletal: Negative for back pain and falls.  Skin: Negative for itching and rash.  Neurological: Negative for seizures and loss of consciousness.  Endo/Heme/Allergies: Negative for polydipsia. Does not bruise/bleed easily.  Psychiatric/Behavioral: Negative for substance abuse. The patient is not nervous/anxious.      Physical Exam: Vital signs: Vitals:   06/20/20 1150 06/20/20 1157  BP:    Pulse: 94 99  Resp: 19 20  Temp:    SpO2: 100% 100%      Physical Exam Vitals reviewed.  Constitutional:      General: She is not in acute distress.    Appearance: Normal appearance.  HENT:     Head: Normocephalic and atraumatic.     Nose: Nose normal.     Mouth/Throat:     Mouth: Mucous membranes are moist.     Pharynx: Oropharynx is clear.  Eyes:     General: No scleral icterus.    Extraocular Movements: Extraocular movements intact.     Conjunctiva/sclera: Conjunctivae normal.  Cardiovascular:     Rate and Rhythm: Normal rate and regular rhythm.     Pulses: Normal pulses.     Heart sounds: Normal heart sounds.  Pulmonary:     Effort: Pulmonary effort is normal. No respiratory distress.     Breath sounds: Normal breath sounds.  Abdominal:     General: Bowel sounds are normal. There is no distension.     Palpations: Abdomen is soft. There is no mass.     Tenderness: There is no abdominal tenderness. There is no guarding or rebound.     Hernia: No hernia is present.  Musculoskeletal:        General: No swelling or tenderness.     Cervical back: Normal range of motion and neck supple.     Left lower leg: No edema.  Skin:    General: Skin is warm and dry.  Neurological:     General: No focal deficit present.     Mental Status: She is alert and oriented to person, place, and time.  Psychiatric:        Mood and Affect: Mood normal.        Behavior: Behavior normal.    GI:  Lab Results: Recent Labs    06/20/20 1120  WBC 12.7*  HGB 10.8*  HCT 34.4*  PLT 228   BMET Recent Labs    06/20/20 1120  NA 140  K 3.9  CL 107  CO2 25  GLUCOSE 141*  BUN 13  CREATININE 0.84  CALCIUM 8.9   LFT Recent Labs    06/20/20 1120  PROT 5.3*  ALBUMIN 3.0*  AST 25  ALT 22  ALKPHOS 52  BILITOT 0.3   PT/INR No results for input(s): LABPROT, INR in the last 72 hours.   Studies/Results: No results found.  Impression: Post-polypectomy bleeding (s/p colonoscopy 06/16/20 with removal of 65mm ascending colon polyp) -Hgb 10.8  (compared to 18.0 on 6/21) -BUN 13/ Cr 0.84 -Hemodynamically stable (normotensive, HR 90s-100)  Plan: Colonoscopy tomorrow with Dr. Alessandra Bevels.  I thoroughly discussed the procedure, nature, alternatives, benefits, and risks with the patient.  Patient verbalized understanding and gave verbal consent to proceed with colonoscopy tomorrow.  Continue to monitor H&H with transfusion as needed to maintain Hgb >7-8.  [facility] will follow.   LOS: 0 days   Salley Slaughter  PA-C 06/20/2020, 1:23 PM  Contact #  [phone]

## 2020-06-20 NOTE — ED Provider Notes (Signed)
[place] EMERGENCY DEPARTMENT Provider Note   CSN: [id number] Arrival date & time: 06/20/20  1010     History Chief Complaint  Patient presents with  . Rectal Bleeding    Meghan Lynn is a 69 y.o. female.  The history is provided by the patient.  Rectal Bleeding Quality:  Maroon Amount:  Copious Duration:  4 days Timing:  Intermittent Chronicity:  New Context comment:  Patient had an endoscopy and colonoscopy done on Monday.  She did have a polyp removed on colonoscopy and reports since Tuesday has had worsening rectal bleeding with more frequent episodes with dark blood and clot Similar prior episodes: no   Relieved by:  None tried Worsened by:  Nothing Ineffective treatments:  None tried Associated symptoms: dizziness and light-headedness   Associated symptoms: no abdominal pain, no fever, no hematemesis, no loss of consciousness and no vomiting   Associated symptoms comment:  Patient reports now she is becoming winded and getting very out of breath even with going up stairs in her home which has only started in the last few days. Risk factors: no anticoagulant use   Risk factors comment:  Does take aspirin but has been off of the aspirin for over a week      Past Medical History:  Diagnosis Date  . Active smoker   . Anginal pain ([place])   . Arthritis   . Asthma    patient denies  . Bronchitis due to tobacco use   . Burning sensation of feet   . Complication of anesthesia 2017   "Mouth was swollen, neck swollen, black and blue, fInger prints in neck."  . Coronary artery disease    3 stents to RCA  . Dilatation of esophagus   . Gastritis   . GERD (gastroesophageal reflux disease)   . 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 Headache, migraine    daily  . Hyperlipidemia   . Hypertension   . Peptic ulcer disease   . Pneumonia   . Shortness of breath dyspnea    with exertion    Patient Active Problem List   Diagnosis Date Noted  .  Dysuria 03/13/2020  . H/O pyloric stenosis 01/31/2020  . Acute pain of right wrist 01/21/2020  . Weight gain 07/18/2018  . Hair loss 07/18/2018  . Screening for thyroid disorder 07/18/2018  . Pneumonia   . Peptic ulcer disease   . Hypertension   . Hyperlipidemia   . Headache, migraine   . Gastritis   . Dilatation of esophagus   . Coronary artery disease   . Burning sensation of feet   . Bronchitis due to tobacco use   . Asthma   . Arthritis   . Anginal pain ([place])   . Weakness of both lower extremities 06/22/2017  . Screening for abdominal aortic aneurysm 06/22/2017  . Shortness of breath 09/29/2016  . GERD (gastroesophageal reflux disease) 09/30/2015  . Coronary artery disease due to lipid rich plaque 08/29/2014  . HTN (hypertension) 08/29/2014  . Dyslipidemia 08/29/2014  . Smoker 08/29/2014  . Spondylolisthesis of l3-4 01/27/2013    Past Surgical History:  Procedure Laterality Date  . BACK SURGERY    . BALLOON DILATION N/A 05/30/2013   Procedure: BALLOON DILATION;  Surgeon: Missy Sabins, MD;  Location[facility] ENDOSCOPY;  Service: Endoscopy;  Laterality: N/A;  . BALLOON DILATION N/A 08/02/2013   Procedure: BALLOON DILATION;  Surgeon: Missy Sabins, MD;  Location: [place];  Service:  Endoscopy;  Laterality: N/A;  . BALLOON DILATION N/A 04/29/2016   Procedure: BALLOON DILATION;  Surgeon: Teena Irani, MD;  Location: [facility] ENDOSCOPY;  Service: Endoscopy;  Laterality: N/A;  . BALLOON DILATION N/A 06/16/2020   Procedure: BALLOON DILATION;  Surgeon: Ronnette Juniper, MD;  Location: [place];  Service: Gastroenterology;  Laterality: N/A;  . BIOPSY  06/16/2020   Procedure: BIOPSY;  Surgeon: Ronnette Juniper, MD;  Location: [place];  Service: Gastroenterology;;  . CARDIAC CATHETERIZATION  03/07/2002   moderte CAD 80% (mid RCA) - Dr. Myrtice Lauth  . CARDIAC CATHETERIZATION  09/13/2003   Cypher 2.5x54mm and 2.5x18 and Taxus 2.75x8mm to distal, mid, prox RCA (Dr. Gerrie Nordmann)  . CARDIOLITE  MYOCARDIAL PERFUSION STUDY  05/2006   positive bruce protocol, low risk, EF 80%  . CARDIOVASCULAR STRESS TEST  2010  . COLONOSCOPY    . COLONOSCOPY WITH PROPOFOL N/A 06/16/2020   Procedure: COLONOSCOPY WITH PROPOFOL;  Surgeon: Ronnette Juniper, MD;  Location: [place];  Service: Gastroenterology;  Laterality: N/A;  . CORONARY ANGIOPLASTY  2004   stents x 3  . DILATATION & CURETTAGE/HYSTEROSCOPY WITH MYOSURE N/A 08/19/2016   Procedure: DILATATION & CURETTAGE/HYSTEROSCOPY WITH MYOSURE;  Surgeon: Servando Salina, MD;  Location: [facility];  Service: Gynecology;  Laterality: N/A;  . ESOPHAGOGASTRODUODENOSCOPY N/A 05/30/2013   Procedure: ESOPHAGOGASTRODUODENOSCOPY (EGD);  Surgeon: Missy Sabins, MD;  Location: [facility] ENDOSCOPY;  Service: Endoscopy;  Laterality: N/A;  . ESOPHAGOGASTRODUODENOSCOPY N/A 08/02/2013   Procedure: ESOPHAGOGASTRODUODENOSCOPY (EGD);  Surgeon: Missy Sabins, MD;  Location: [facility] ENDOSCOPY;  Service: Endoscopy;  Laterality: N/A;  barb Greggory [name]  . ESOPHAGOGASTRODUODENOSCOPY (EGD) WITH PROPOFOL N/A 04/29/2016   Procedure: ESOPHAGOGASTRODUODENOSCOPY (EGD) WITH PROPOFOL;  Surgeon: Teena Irani, MD;  Location: [facility] ENDOSCOPY;  Service: Endoscopy;  Laterality: N/A;  . ESOPHAGOGASTRODUODENOSCOPY (EGD) WITH PROPOFOL N/A 06/16/2020   Procedure: ESOPHAGOGASTRODUODENOSCOPY (EGD) WITH PROPOFOL;  Surgeon: Ronnette Juniper, MD;  Location: [place];  Service: Gastroenterology;  Laterality: N/A;  . FRACTURE SURGERY    . ORIF ELBOW FRACTURE  2006  . POLYPECTOMY  06/16/2020   Procedure: POLYPECTOMY;  Surgeon: Ronnette Juniper, MD;  Location: [facility] ENDOSCOPY;  Service: Gastroenterology;;  . TRANSTHORACIC ECHOCARDIOGRAM  07/2008   normal LV function, mild MR, mild TR, trace pulm valve regurg     OB History   No obstetric history on file.     Family History  Problem Relation Age of Onset  . Diabetes Mother   . CAD Mother        CABG  . Bone cancer Father   . Hypertension Father   . Heart disease Brother   . Kidney disease  Sister   . Cancer Sister   . Diabetes Sister   . Hyperlipidemia Sister   . Hypertension Sister     Social History   Tobacco Use  . Smoking status: Current Every Day Smoker    Packs/day: 0.60    Years: 45.00    Pack years: 27.00    Types: Cigarettes  . Smokeless tobacco: Never Used  Vaping Use  . Vaping Use: Never used  Substance Use Topics  . Alcohol use: Yes    Alcohol/week: 0.0 standard drinks    Comment: occ  . Drug use: No    Home Medications Prior to Admission medications   Medication Sig Start Date End Date Taking? Authorizing Provider  aspirin EC 81 MG tablet Take 81 mg by mouth daily.     [provider]  atorvastatin (LIPITOR) 20 MG tablet Take 20 mg by  mouth daily.    [provider]  Biotin 1 MG CAPS Take 1 mg by mouth daily.     [provider]  fluconazole (DIFLUCAN) 150 MG tablet Take 1 tablet (150 mg total) by mouth daily. Patient not taking: Reported on 05/13/2020 03/13/20   Wendall Mola, NP  hydrochlorothiazide (HYDRODIURIL) 12.5 MG tablet Take 1 tablet (12.5 mg total) by mouth daily. Patient not taking: Reported on 05/13/2020 12/26/19   Posey Boyer, MD  metoprolol succinate (TOPROL-XL) 25 MG 24 hr tablet TAKE 1 TABLET BY MOUTH EVERY DAY Patient taking differently: Take 25 mg by mouth daily.  12/17/19   Hilty, Nadean Corwin, MD  Multiple Vitamin (MULTIVITAMIN WITH MINERALS) TABS Take 1 tablet by mouth daily.    [provider]  omeprazole (PRILOSEC) 20 MG capsule Take 20 mg by mouth daily.    [provider]  valsartan (DIOVAN) 320 MG tablet TAKE 1 TABLET BY MOUTH EVERY DAY Patient taking differently: Take 320 mg by mouth daily.  03/14/20   Hilty, Nadean Corwin, MD    Allergies    Propoxyphene and Latex  Review of Systems   Review of Systems  Constitutional: Negative for fever.  Cardiovascular:       With exertion in the last few days she will feel some chest tightness and shortness of breath that resolves  with sitting down and resting  Gastrointestinal: Positive for hematochezia. Negative for abdominal pain, hematemesis and vomiting.  Neurological: Positive for dizziness and light-headedness. Negative for loss of consciousness.  All other systems reviewed and are negative.   Physical Exam Updated Vital Signs BP 121/82   Pulse 99   Temp 98.7 F (37.1 C) (Oral)   Resp 20   Ht 5\' 1"  (1.549 m)   Wt 69.4 kg   SpO2 100%   BMI 28.91 kg/m   Physical Exam Vitals and nursing note reviewed.  Constitutional:      General: She is not in acute distress.    Appearance: Normal appearance. She is well-developed and normal weight.  HENT:     Head: Normocephalic and atraumatic.  Eyes:     Pupils: Pupils are equal, round, and reactive to light.  Cardiovascular:     Rate and Rhythm: Regular rhythm. Tachycardia present.     Heart sounds: Normal heart sounds. No murmur heard.  No friction rub.  Pulmonary:     Effort: Pulmonary effort is normal.     Breath sounds: Normal breath sounds. No wheezing or rales.  Abdominal:     General: Bowel sounds are normal. There is no distension.     Palpations: Abdomen is soft.     Tenderness: There is no abdominal tenderness. There is no guarding or rebound.  Genitourinary:    Rectum: Guaiac result positive.     Comments: With rectal exam dark blood present on exam finger Musculoskeletal:        General: No tenderness. Normal range of motion.     Comments: No edema  Skin:    General: Skin is warm and dry.     Findings: No rash.  Neurological:     Mental Status: She is alert and oriented to person, place, and time.     Cranial Nerves: No cranial nerve deficit.  Psychiatric:        Behavior: Behavior normal.      ED Results / Procedures / Treatments   Labs (all labs ordered are listed, but only abnormal results are displayed) Labs Reviewed  COMPREHENSIVE METABOLIC PANEL - Abnormal; Notable for the following components:      Result Value    Glucose, Bld 141 (*)    Total Protein 5.3 (*)    Albumin 3.0 (*)    All other components within normal limits  CBC - Abnormal; Notable for the following components:   WBC 12.7 (*)    Hemoglobin 10.8 (*)    HCT 34.4 (*)    MCH 25.3 (*)    All other components within normal limits  POC OCCULT BLOOD, ED - Abnormal; Notable for the following components:   Fecal Occult Bld POSITIVE (*)    All other components within normal limits  SARS CORONAVIRUS 2 BY RT PCR (HOSPITAL ORDER, [place] [facility])  CBC WITH DIFFERENTIAL/PLATELET  POC OCCULT BLOOD, ED  TYPE AND SCREEN    EKG EKG Interpretation  Date/Time:  Friday June 20 2020 13:11:45 EDT Ventricular Rate:  95 PR Interval:    QRS Duration: 88 QT Interval:  353 QTC Calculation: 444 R Axis:   44 Text Interpretation: Sinus rhythm Nonspecific repol abnormality, lateral leads No significant change since last tracing Confirmed by Blanchie Dessert [phone]) on 06/20/2020 1:56:06 PM   Radiology No results found.  Procedures Procedures (including critical care time)  Medications Ordered in ED Medications - No data to display  ED Course  I have reviewed the triage vital signs and the nursing notes.  Pertinent labs & imaging results that were available during my care of the patient were reviewed by me and considered in my medical decision making (see chart for details).    MDM Rules/Calculators/A&P                          Elderly female presenting today with persistent rectal bleeding after colonoscopy on Monday.  Patient is now starting to have symptoms concerning for anemia with exertional dyspnea, fatigue and weakness.  She will occasionally get chest discomfort with exertion but denies any of those symptoms at this time.  Patient has had lightheadedness but no episodes of syncope.  Patient is not on any anticoagulation at this time.  On exam she is awake and alert and speaking.  She is mildly tachycardic.  She  does have bloody stool on rectal exam.  Initial hemoglobin is ten from her prior at eighteen.  Will discuss with the [facility].  At this time patient is refusing blood transfusion.  When asked if it was the only thing that would save her life she reports I do want someone else's blood.  Will have consultant come and evaluate the patient.  1:05 PM Heme positive stool and drop  Of hb.  CMP without acute findings. [facility] to see the pt.  Dr. Penelope Coop recommended admission and they will most likely do a colonoscopy in the morning.  EKG without acute findings and pt admitted for further care.  MDM Number of Diagnoses or Management Options   Amount and/or Complexity of Data Reviewed Clinical lab tests: ordered and reviewed Tests in the medicine section of CPT: ordered and reviewed Decide to obtain previous medical records or to obtain history from someone other than the patient: yes Obtain history from someone other than the patient: no Review and summarize past medical records: yes Discuss the patient with other providers: yes Independent visualization of images, tracings, or specimens: yes  Risk of Complications, Morbidity, and/or Mortality Presenting problems: high Diagnostic procedures: low Management options: low  Patient Progress Patient progress: stable  Final Clinical Impression(s) / ED Diagnoses Final diagnoses:  Lower GI bleed  Symptomatic anemia    Rx / DC Orders ED Discharge Orders    None       Blanchie Dessert, MD 06/20/20 1635

## 2020-06-20 NOTE — ED Notes (Addendum)
GI consulting provider at bedside

## 2020-06-20 NOTE — ED Triage Notes (Signed)
Pt reports endoscopy and colonoscopy on Monday and on Tuesday she started having rectal bleeding, bright red with clots. Pt now starting to feel weak and dizzy at times. States she does not want a blood transfusion if she needed one. Pt a.o, nad noted.

## 2020-06-20 NOTE — H&P (View-Only) (Signed)
Referring Provider: Dr. Blanchie Dessert (ED) Primary Care Physician:  Rutherford Guys, MD Primary Gastroenterologist:  Dr. Therisa Doyne [facility] GI)  Reason for Consultation:  Post-polypectomy bleeding  HPI: Meghan Lynn is a 69 y.o. female with past medical history noted below to include CAD and pyloric stenosis presenting with suspected post-polypectomy bleeding.  Patient underwent colonoscopy on Monday 6/21 with removal of an 55mm ascending colon polyp.  On 6/22, patient started noticing "a lot" of rectal bleeding.  She states she has been having bleeding every day since that time.  She has been having 4 to 5 bloody bowel movements per day.  She describes BMs as frank red blood mixed with dark clots.  She denies any abdominal pain, though she notes some lower abdominal cramping.     Patient has been having some shortness of breath and chest discomfort for the last day or 2, thus she presented to the ED today.  She denies any nausea, vomiting, hematemesis, dysphagia, GERD, changes in appetite, or melena.  Patient takes 81mg  aspirin daily but hasn't taken it in over 1 week.  Takes 400mg  ibuprofen approximately once per day due to right arm pain.  Denies anticoagulation use.  Records reviewed: -Colonoscopy 06/16/20: 19mm ascending colon polyp, removed (bx: tubular adenoma, no dysplasia or malignancy).  Patient was also noted to have some ulcerated mucosa in the cecum (bx: benign with reactive changes, no inflammation or microscopic colitis, no malignancy or dysplasia). Patient also went  -EGD 06/16/20: Pyloric stenosis, dilated.  Erythematous mucosa in the stomach (bx: mild chronic gastritis with reactive changes.)  Past Medical History:  Diagnosis Date  . Active smoker   . Anginal pain ([place])   . Arthritis   . Asthma    patient denies  . Bronchitis due to tobacco use   . Burning sensation of feet   . Complication of anesthesia 2017   "Mouth was swollen, neck swollen, black and blue, fInger prints  in neck."  . Coronary artery disease    3 stents to RCA  . Dilatation of esophagus   . Gastritis   . GERD (gastroesophageal reflux disease)   . 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 Headache, migraine    daily  . Hyperlipidemia   . Hypertension   . Peptic ulcer disease   . Pneumonia   . Shortness of breath dyspnea    with exertion    Past Surgical History:  Procedure Laterality Date  . BACK SURGERY    . BALLOON DILATION N/A 05/30/2013   Procedure: BALLOON DILATION;  Surgeon: Missy Sabins, MD;  Location: [facility] ENDOSCOPY;  Service: Endoscopy;  Laterality: N/A;  . BALLOON DILATION N/A 08/02/2013   Procedure: BALLOON DILATION;  Surgeon: Missy Sabins, MD;  Location: [place];  Service: Endoscopy;  Laterality: N/A;  . BALLOON DILATION N/A 04/29/2016   Procedure: BALLOON DILATION;  Surgeon: Teena Irani, MD;  Location: [facility] ENDOSCOPY;  Service: Endoscopy;  Laterality: N/A;  . BALLOON DILATION N/A 06/16/2020   Procedure: BALLOON DILATION;  Surgeon: Ronnette Juniper, MD;  Location: [place];  Service: Gastroenterology;  Laterality: N/A;  . BIOPSY  06/16/2020   Procedure: BIOPSY;  Surgeon: Ronnette Juniper, MD;  Location: [place];  Service: Gastroenterology;;  . CARDIAC CATHETERIZATION  03/07/2002   moderte CAD 80% (mid RCA) - Dr. Myrtice Lauth  . CARDIAC CATHETERIZATION  09/13/2003   Cypher 2.5x65mm and 2.5x18 and Taxus 2.75x4mm to distal, mid, prox RCA (Dr. Gerrie Nordmann)  . CARDIOLITE MYOCARDIAL PERFUSION  STUDY  05/2006   positive bruce protocol, low risk, EF 80%  . CARDIOVASCULAR STRESS TEST  2010  . COLONOSCOPY    . COLONOSCOPY WITH PROPOFOL N/A 06/16/2020   Procedure: COLONOSCOPY WITH PROPOFOL;  Surgeon: Ronnette Juniper, MD;  Location: [place];  Service: Gastroenterology;  Laterality: N/A;  . CORONARY ANGIOPLASTY  2004   stents x 3  . DILATATION & CURETTAGE/HYSTEROSCOPY WITH MYOSURE N/A 08/19/2016   Procedure: DILATATION & CURETTAGE/HYSTEROSCOPY WITH MYOSURE;  Surgeon: Servando Salina,  MD;  Location: [place] [facility];  Service: Gynecology;  Laterality: N/A;  . ESOPHAGOGASTRODUODENOSCOPY N/A 05/30/2013   Procedure: ESOPHAGOGASTRODUODENOSCOPY (EGD);  Surgeon: Missy Sabins, MD;  Location: [facility] ENDOSCOPY;  Service: Endoscopy;  Laterality: N/A;  . ESOPHAGOGASTRODUODENOSCOPY N/A 08/02/2013   Procedure: ESOPHAGOGASTRODUODENOSCOPY (EGD);  Surgeon: Missy Sabins, MD;  Location: [facility] ENDOSCOPY;  Service: Endoscopy;  Laterality: N/A;  barb Greggory [name]  . ESOPHAGOGASTRODUODENOSCOPY (EGD) WITH PROPOFOL N/A 04/29/2016   Procedure: ESOPHAGOGASTRODUODENOSCOPY (EGD) WITH PROPOFOL;  Surgeon: Teena Irani, MD;  Location: [facility] ENDOSCOPY;  Service: Endoscopy;  Laterality: N/A;  . ESOPHAGOGASTRODUODENOSCOPY (EGD) WITH PROPOFOL N/A 06/16/2020   Procedure: ESOPHAGOGASTRODUODENOSCOPY (EGD) WITH PROPOFOL;  Surgeon: Ronnette Juniper, MD;  Location: [place];  Service: Gastroenterology;  Laterality: N/A;  . FRACTURE SURGERY    . ORIF ELBOW FRACTURE  2006  . POLYPECTOMY  06/16/2020   Procedure: POLYPECTOMY;  Surgeon: Ronnette Juniper, MD;  Location: [facility] ENDOSCOPY;  Service: Gastroenterology;;  . TRANSTHORACIC ECHOCARDIOGRAM  07/2008   normal LV function, mild MR, mild TR, trace pulm valve regurg    Prior to Admission medications   Medication Sig Start Date End Date Taking? Authorizing Provider  aspirin EC 81 MG tablet Take 81 mg by mouth daily.    Yes [provider]  Aspirin-Acetaminophen-Caffeine (GOODY HEADACHE PO) Take 1 Package by mouth as needed (headache).   Yes [provider]  Biotin 1 MG CAPS Take 1 mg by mouth daily.    Yes [provider]  ibuprofen (ADVIL) 200 MG tablet Take 400 mg by mouth every 6 (six) hours as needed for moderate pain.   Yes [provider]  metoprolol succinate (TOPROL-XL) 25 MG 24 hr tablet TAKE 1 TABLET BY MOUTH EVERY DAY Patient taking differently: Take 25 mg by mouth daily.  12/17/19  Yes Hilty, Nadean Corwin, MD  Multiple Vitamin (MULTIVITAMIN WITH MINERALS) TABS Take 1  tablet by mouth daily.   Yes [provider]  omeprazole (PRILOSEC) 20 MG capsule Take 20 mg by mouth daily.   Yes [provider]  valsartan (DIOVAN) 320 MG tablet TAKE 1 TABLET BY MOUTH EVERY DAY Patient taking differently: Take 320 mg by mouth daily.  03/14/20  Yes Hilty, Nadean Corwin, MD  fluconazole (DIFLUCAN) 150 MG tablet Take 1 tablet (150 mg total) by mouth daily. Patient not taking: Reported on 05/13/2020 03/13/20   Wendall Mola, NP  hydrochlorothiazide (HYDRODIURIL) 12.5 MG tablet Take 1 tablet (12.5 mg total) by mouth daily. Patient not taking: Reported on 05/13/2020 12/26/19   Posey Boyer, MD    Scheduled Meds: . polyethylene glycol-electrolytes  4,000 mL Oral Once   Continuous Infusions: PRN Meds:.  Allergies as of 06/20/2020 - Review Complete 06/20/2020  Allergen Reaction Noted  . Propoxyphene Nausea And Vomiting 04/21/2016  . Latex Itching 04/21/2016    Family History  Problem Relation Age of Onset  . Diabetes Mother   . CAD Mother        CABG  . Bone cancer Father   .  Hypertension Father   . Heart disease Brother   . Kidney disease Sister   . Cancer Sister   . Diabetes Sister   . Hyperlipidemia Sister   . Hypertension Sister     Social History   Socioeconomic History  . Marital status: Widowed    Spouse name: Not on file  . Number of children: 3  . Years of education: 43  . Highest education level: Not on file  Occupational History    Employer: [employer]  Tobacco Use  . Smoking status: Current Every Day Smoker    Packs/day: 0.60    Years: 45.00    Pack years: 27.00    Types: Cigarettes  . Smokeless tobacco: Never Used  Vaping Use  . Vaping Use: Never used  Substance and Sexual Activity  . Alcohol use: Yes    Alcohol/week: 0.0 standard drinks    Comment: occ  . Drug use: No  . Sexual activity: Yes    Birth control/protection: Abstinence  Other Topics Concern  . Not on file  Social History Narrative   [place]  Sleepiness scale score: 5   Social Determinants of Health   Financial Resource Strain:   . Difficulty of Paying Living Expenses:   Food Insecurity:   . Worried About Charity fundraiser in the Last Year:   . Arboriculturist in the Last Year:   Transportation Needs:   . Film/video editor (Medical):   [name] Kitchen Lack of Transportation (Non-Medical):   Physical Activity:   . Days of Exercise per Week:   . Minutes of Exercise per Session:   Stress:   . Feeling of Stress :   Social Connections:   . Frequency of Communication with Friends and Family:   . Frequency of Social Gatherings with Friends and Family:   . Attends Religious Services:   . Active Member of Clubs or Organizations:   . Attends Archivist Meetings:   [name] Kitchen Marital Status:   Intimate Partner Violence:   . Fear of Current or Ex-Partner:   . Emotionally Abused:   [name] Kitchen Physically Abused:   . Sexually Abused:     Review of Systems: Review of Systems  Constitutional: Positive for malaise/fatigue. Negative for chills, fever and weight loss.  HENT: Negative for hearing loss and tinnitus.   Eyes: Negative for pain and redness.  Respiratory: Positive for shortness of breath. Negative for cough.   Cardiovascular: Positive for chest pain. Negative for palpitations.  Gastrointestinal: Positive for blood in stool. Negative for abdominal pain, constipation, diarrhea, heartburn, melena, nausea and vomiting.  Genitourinary: Negative for flank pain and hematuria.  Musculoskeletal: Negative for back pain and falls.  Skin: Negative for itching and rash.  Neurological: Negative for seizures and loss of consciousness.  Endo/Heme/Allergies: Negative for polydipsia. Does not bruise/bleed easily.  Psychiatric/Behavioral: Negative for substance abuse. The patient is not nervous/anxious.      Physical Exam: Vital signs: Vitals:   06/20/20 1150 06/20/20 1157  BP:    Pulse: 94 99  Resp: 19 20  Temp:    SpO2: 100% 100%      Physical Exam Vitals reviewed.  Constitutional:      General: She is not in acute distress.    Appearance: Normal appearance.  HENT:     Head: Normocephalic and atraumatic.     Nose: Nose normal.     Mouth/Throat:     Mouth: Mucous membranes are moist.     Pharynx: Oropharynx is clear.  Eyes:     General: No scleral icterus.    Extraocular Movements: Extraocular movements intact.     Conjunctiva/sclera: Conjunctivae normal.  Cardiovascular:     Rate and Rhythm: Normal rate and regular rhythm.     Pulses: Normal pulses.     Heart sounds: Normal heart sounds.  Pulmonary:     Effort: Pulmonary effort is normal. No respiratory distress.     Breath sounds: Normal breath sounds.  Abdominal:     General: Bowel sounds are normal. There is no distension.     Palpations: Abdomen is soft. There is no mass.     Tenderness: There is no abdominal tenderness. There is no guarding or rebound.     Hernia: No hernia is present.  Musculoskeletal:        General: No swelling or tenderness.     Cervical back: Normal range of motion and neck supple.     Left lower leg: No edema.  Skin:    General: Skin is warm and dry.  Neurological:     General: No focal deficit present.     Mental Status: She is alert and oriented to person, place, and time.  Psychiatric:        Mood and Affect: Mood normal.        Behavior: Behavior normal.    GI:  Lab Results: Recent Labs    06/20/20 1120  WBC 12.7*  HGB 10.8*  HCT 34.4*  PLT 228   BMET Recent Labs    06/20/20 1120  NA 140  K 3.9  CL 107  CO2 25  GLUCOSE 141*  BUN 13  CREATININE 0.84  CALCIUM 8.9   LFT Recent Labs    06/20/20 1120  PROT 5.3*  ALBUMIN 3.0*  AST 25  ALT 22  ALKPHOS 52  BILITOT 0.3   PT/INR No results for input(s): LABPROT, INR in the last 72 hours.   Studies/Results: No results found.  Impression: Post-polypectomy bleeding (s/p colonoscopy 06/16/20 with removal of 27mm ascending colon polyp) -Hgb 10.8  (compared to 18.0 on 6/21) -BUN 13/ Cr 0.84 -Hemodynamically stable (normotensive, HR 90s-100)  Plan: Colonoscopy tomorrow with Dr. Alessandra Bevels.  I thoroughly discussed the procedure, nature, alternatives, benefits, and risks with the patient.  Patient verbalized understanding and gave verbal consent to proceed with colonoscopy tomorrow.  Continue to monitor H&H with transfusion as needed to maintain Hgb >7-8.  [facility] will follow.   LOS: 0 days   Salley Slaughter  PA-C 06/20/2020, 1:23 PM  Contact #  [phone]

## 2020-06-21 ENCOUNTER — Observation Stay (HOSPITAL_COMMUNITY): Payer: Medicare HMO | Admitting: Certified Registered Nurse Anesthetist

## 2020-06-21 ENCOUNTER — Encounter (HOSPITAL_COMMUNITY): Payer: Self-pay | Admitting: Internal Medicine

## 2020-06-21 ENCOUNTER — Encounter (HOSPITAL_COMMUNITY)
Admission: EM | Disposition: A | Discharge status: Home / Self Care | Payer: Self-pay | Source: Home / Self Care | Attending: Internal Medicine

## 2020-06-21 DIAGNOSIS — K633 Ulcer of intestine: Secondary | ICD-10-CM | POA: Diagnosis not present

## 2020-06-21 DIAGNOSIS — I10 Essential (primary) hypertension: Secondary | ICD-10-CM | POA: Diagnosis not present

## 2020-06-21 DIAGNOSIS — I2510 Atherosclerotic heart disease of native coronary artery without angina pectoris: Secondary | ICD-10-CM | POA: Diagnosis not present

## 2020-06-21 DIAGNOSIS — K625 Hemorrhage of anus and rectum: Secondary | ICD-10-CM | POA: Diagnosis not present

## 2020-06-21 DIAGNOSIS — Z20822 Contact with and (suspected) exposure to covid-19: Secondary | ICD-10-CM | POA: Diagnosis not present

## 2020-06-21 DIAGNOSIS — K922 Gastrointestinal hemorrhage, unspecified: Secondary | ICD-10-CM | POA: Diagnosis not present

## 2020-06-21 DIAGNOSIS — K91840 Postprocedural hemorrhage and hematoma of a digestive system organ or structure following a digestive system procedure: Secondary | ICD-10-CM | POA: Diagnosis not present

## 2020-06-21 DIAGNOSIS — D649 Anemia, unspecified: Secondary | ICD-10-CM

## 2020-06-21 DIAGNOSIS — K219 Gastro-esophageal reflux disease without esophagitis: Secondary | ICD-10-CM | POA: Diagnosis not present

## 2020-06-21 DIAGNOSIS — D124 Benign neoplasm of descending colon: Secondary | ICD-10-CM | POA: Diagnosis not present

## 2020-06-21 DIAGNOSIS — D123 Benign neoplasm of transverse colon: Secondary | ICD-10-CM | POA: Diagnosis not present

## 2020-06-21 DIAGNOSIS — D125 Benign neoplasm of sigmoid colon: Secondary | ICD-10-CM | POA: Diagnosis not present

## 2020-06-21 DIAGNOSIS — D62 Acute posthemorrhagic anemia: Secondary | ICD-10-CM | POA: Diagnosis not present

## 2020-06-21 DIAGNOSIS — K648 Other hemorrhoids: Secondary | ICD-10-CM | POA: Diagnosis not present

## 2020-06-21 HISTORY — PX: POLYPECTOMY: SHX5525

## 2020-06-21 HISTORY — PX: HEMOSTASIS CLIP PLACEMENT: SHX6857

## 2020-06-21 HISTORY — PX: COLONOSCOPY WITH PROPOFOL: SHX5780

## 2020-06-21 LAB — BASIC METABOLIC PANEL
Anion gap: 11 (ref 5–15)
BUN: 6 mg/dL — ABNORMAL LOW (ref 8–23)
CO2: 25 mmol/L (ref 22–32)
Calcium: 8.9 mg/dL (ref 8.9–10.3)
Chloride: 106 mmol/L (ref 98–111)
Creatinine, Ser: 0.88 mg/dL (ref 0.44–1.00)
GFR calc Af Amer: >60 mL/min (ref >60)
GFR calc non Af Amer: >60 mL/min (ref >60)
Glucose, Bld: 130 mg/dL — ABNORMAL HIGH (ref 70–99)
Potassium: 3.8 mmol/L (ref 3.5–5.1)
Sodium: 142 mmol/L (ref 135–145)

## 2020-06-21 LAB — CBC
HCT: 37.6 % (ref 36.0–46.0)
Hemoglobin: 12.1 g/dL (ref 12.0–15.0)
MCH: 25.9 pg — ABNORMAL LOW (ref 26.0–34.0)
MCHC: 32.2 g/dL (ref 30.0–36.0)
MCV: 80.3 fL (ref 80.0–100.0)
Platelets: 209 10*3/uL (ref 150–400)
RBC: 4.68 MIL/uL (ref 3.87–5.11)
RDW: 15.1 % (ref 11.5–15.5)
WBC: 13.5 10*3/uL — ABNORMAL HIGH (ref 4.0–10.5)
nRBC: 0 % (ref 0.0–0.2)

## 2020-06-21 LAB — RETICULOCYTES
Immature Retic Fract: 23.7 % — ABNORMAL HIGH (ref 2.3–15.9)
RBC.: 4.72 MIL/uL (ref 3.87–5.11)
Retic Count, Absolute: 97.2 10*3/uL (ref 19.0–186.0)
Retic Ct Pct: 2.1 % (ref 0.4–3.1)

## 2020-06-21 LAB — PREPARE RBC (CROSSMATCH)

## 2020-06-21 LAB — MAGNESIUM: Magnesium: 1.9 mg/dL (ref 1.7–2.4)

## 2020-06-21 LAB — FERRITIN: Ferritin: 78 ng/mL (ref 11–307)

## 2020-06-21 SURGERY — COLONOSCOPY WITH PROPOFOL
Anesthesia: Monitor Anesthesia Care

## 2020-06-21 MED ORDER — PROPOFOL 10 MG/ML IV BOLUS
INTRAVENOUS | Status: DC | PRN
  Administered 2020-06-21 (×2): 20 mg via INTRAVENOUS

## 2020-06-21 MED ORDER — LACTATED RINGERS IV SOLN: INTRAVENOUS | Status: DC | PRN

## 2020-06-21 MED ORDER — SODIUM CHLORIDE 0.9% IV SOLUTION: Freq: Once | INTRAVENOUS | Status: DC

## 2020-06-21 MED ORDER — PROPOFOL 500 MG/50ML IV EMUL
INTRAVENOUS | Status: DC | PRN
  Administered 2020-06-21: 100 ug/kg/min via INTRAVENOUS

## 2020-06-21 MED ORDER — SODIUM CHLORIDE 0.9 % IV SOLN
510.0000 mg | Freq: Once | INTRAVENOUS | Status: AC
  Administered 2020-06-21: 510 mg via INTRAVENOUS
  Filled 2020-06-21: qty 17

## 2020-06-21 SURGICAL SUPPLY — 22 items

## 2020-06-21 NOTE — Progress Notes (Signed)
HD#0 Subjective:  Overnight Events: Symptomatic anemia overnight and had blood transfusion now s/p 1U.    She is feeling well this morning, ready to get her procedure done. Discussed colonoscopy procedure and possible clamping and how to close of the bleed. She is still passing blood and clots in her stool.  She is frustrated about the bleed. Discussed that her polyp is quite big and that these are more likely to bleed.  Objective:  Vital signs in last 24 hours: Vitals:   06/21/20 0201 06/21/20 0203 06/21/20 0225 06/21/20 0519  BP:  (!) 108/51 (!) 118/53 103/61  Pulse: 93 90 87 96  Resp:  18 16 16   Temp: 98.4 F (36.9 C)  98.4 F (36.9 C) 98.7 F (37.1 C)  TempSrc: Oral  Oral Oral  SpO2: 100% 100% 100% 100%  Weight:      Height:       Supplemental O2: Room Air SpO2: 100 %   Physical Exam:  Physical Exam Constitutional:      Appearance: Normal appearance.  HENT:     Head: Normocephalic and atraumatic.  Eyes:     Extraocular Movements: Extraocular movements intact.  Cardiovascular:     Rate and Rhythm: Normal rate.     Pulses: Normal pulses.     Heart sounds: Normal heart sounds.  Pulmonary:     Effort: Pulmonary effort is normal.     Breath sounds: Normal breath sounds.  Abdominal:     General: Bowel sounds are normal. There is distension.     Palpations: Abdomen is soft.     Tenderness: There is abdominal tenderness (diffuse).  Musculoskeletal:        General: Normal range of motion.     Cervical back: Normal range of motion.     Right lower leg: No edema.     Left lower leg: No edema.  Skin:    General: Skin is warm and dry.  Neurological:     Mental Status: She is alert and oriented to person, place, and time. Mental status is at baseline.  Psychiatric:        Mood and Affect: Mood normal.     Filed Weights   06/20/20 1017  Weight: 69.4 kg     Intake/Output Summary (Last 24 hours) at 06/21/2020 0614 Last data filed at 06/21/2020 0519 Gross per  24 hour  Intake 783.54 ml  Output --  Net 783.54 ml   Net IO Since Admission: 783.54 mL [06/21/20 0614]  Pertinent Labs: CBC Latest Ref Rng & Units 06/20/2020 06/20/2020 06/16/2020  WBC 4.0 - 10.5 K/uL 16.1(H) 12.7(H) -  Hemoglobin 12.0 - 15.0 g/dL 9.9(L) 10.8(L) 18.0(H)  Hematocrit 36 - 46 % 31.2(L) 34.4(L) 53.0(H)  Platelets 150 - 400 K/uL 217 228 -    CMP Latest Ref Rng & Units 06/20/2020 06/16/2020 01/31/2020  Glucose 70 - 99 mg/dL 141(H) 98 96  BUN 8 - 23 mg/dL 13 9 12   Creatinine 0.44 - 1.00 mg/dL 0.84 0.80 0.99  Sodium 135 - 145 mmol/L 140 143 141  Potassium 3.5 - 5.1 mmol/L 3.9 3.5 4.1  Chloride 98 - 111 mmol/L 107 109 105  CO2 22 - 32 mmol/L 25 - 19(L)  Calcium 8.9 - 10.3 mg/dL 8.9 - 9.9  Total Protein 6.5 - 8.1 g/dL 5.3(L) - 6.9  Total Bilirubin 0.3 - 1.2 mg/dL 0.3 - <0.2  Alkaline Phos 38 - 126 U/L 52 - 84  AST 15 - 41 U/L 25 - 18  ALT 0 - 44 U/L 22 - 12    Pending Labs: none  Imaging: No results found.  Assessment/Plan:   Active Problems:   Lower GI bleed     Patient Summary: Meghan Lynn is a 69 y.o. with a pertinent PMH of  PMH of tobacco-use disorder, CAD (s/p 3x in RCA), GERD, HLD, PUD, who presented with 4 day hx. of BRBPR with associated shortness of breath, fatigue, and dizziness s/p colonoscopy  and admitted for GI bleed with symptomatic anemia.   She is on hospital day 1 and doing well. She experienced worsenign symptoms associated with her anemia overnight requiring 1 unit of PRBCs. She states that sh tolerated her bowel prep well, but had continued passage of blood throuhgout the night. Post transfusion CBC showed a Hgb of 12.1.   1. GI Bleed - S/p 1 unit PRBC with improvement of symptoms and Hgb of 12.2 - Finished bowel prep - Colonoscopy pending  - Keep NPO - Appreciate GI recommendation. - Ferraheme transfusion today.   2. CAD s/p x3 DES RCA - Continue to hold ASA  3. HTN - Continue to hold home blood pressure medicaitons  4.  Leukocytosis: - Trending downward.  - Continue to monitor for signs of infection.   Diet: NPO IVF: None,None VTE: SCDs Code: Full PT/OT recs: Pending, TOC recs: pending   Dispo: Anticipated discharge to Home in 1 days.    Marianna Payment, D.O. [facility], PGY-1 Date 06/21/2020 Time 6:14 AM Pager: [phone] Please contact the on call pager after 5 pm and on weekends at [phone].

## 2020-06-21 NOTE — Anesthesia Preprocedure Evaluation (Addendum)
Anesthesia Evaluation  Patient identified by MRN, date of birth, ID band Patient awake    Reviewed: Allergy & Precautions, H&P , NPO status , Patient's Chart, lab work & pertinent test results  Airway Mallampati: II  TM Distance: >3 FB Neck ROM: Full    Dental no notable dental hx. (+) Edentulous Upper, Partial Lower, Dental Advisory Given   Pulmonary asthma , Current Smoker and Patient abstained from smoking.,    Pulmonary exam normal breath sounds clear to auscultation       Cardiovascular hypertension, Pt. on medications and Pt. on home beta blockers + CAD and + Cardiac Stents   Rhythm:Regular Rate:Normal     Neuro/Psych  Headaches, negative psych ROS   GI/Hepatic Neg liver ROS, PUD, GERD  Medicated,  Endo/Other  negative endocrine ROS  Renal/GU negative Renal ROS  negative genitourinary   Musculoskeletal  (+) Arthritis , Osteoarthritis,    Abdominal   Peds  Hematology negative hematology ROS (+)   Anesthesia Other Findings   Reproductive/Obstetrics negative OB ROS                            Anesthesia Physical Anesthesia Plan  ASA: III  Anesthesia Plan: MAC   Post-op Pain Management:    Induction: Intravenous  PONV Risk Score and Plan: 1 and Propofol infusion  Airway Management Planned: Simple Face Mask  Additional Equipment:   Intra-op Plan:   Post-operative Plan:   Informed Consent: I have reviewed the patients History and Physical, chart, labs and discussed the procedure including the risks, benefits and alternatives for the proposed anesthesia with the patient or authorized representative who has indicated his/her understanding and acceptance.     Dental advisory given  Plan Discussed with: CRNA  Anesthesia Plan Comments:         Anesthesia Quick Evaluation

## 2020-06-21 NOTE — Transfer of Care (Signed)
Immediate Anesthesia Transfer of Care Note  Patient: Meghan Lynn  Procedure(s) Performed: COLONOSCOPY WITH PROPOFOL (N/A ) POLYPECTOMY HOT HEMOSTASIS (ARGON PLASMA COAGULATION/BICAP) (N/A )  Patient Location: PACU  Anesthesia Type:MAC  Level of Consciousness: awake, alert  and oriented  Airway & Oxygen Therapy: Patient Spontanous Breathing  Post-op Assessment: Report given to RN and Post -op Vital signs reviewed and stable  Post vital signs: Reviewed and stable  Last Vitals:  Vitals Value Taken Time  BP 133/74 06/21/20 1151  Temp 36.6 C 06/21/20 1150  Pulse 84 06/21/20 1155  Resp 18 06/21/20 1155  SpO2 99 % 06/21/20 1155  Vitals shown include unvalidated device data.  Last Pain:  Vitals:   06/21/20 1150  TempSrc:   PainSc: 0-No pain         Complications: No complications documented.

## 2020-06-21 NOTE — Interval H&P Note (Signed)
History and Physical Interval Note:  06/21/2020 11:00 AM  Meghan Lynn  has presented today for surgery, with the diagnosis of post-polypectomy bleeding.  The various methods of treatment have been discussed with the patient and family. After consideration of risks, benefits and other options for treatment, the patient has consented to  Procedure(s): COLONOSCOPY WITH PROPOFOL (N/A) as a surgical intervention.  The patient's history has been reviewed, patient examined, no change in status, stable for surgery.  I have reviewed the patient's chart and labs.  Questions were answered to the patient's satisfaction.    Patient had a hard time finishing prep for colonoscopy.  Continues to have bleeding throughout the prep.  Received blood transfusion yesterday with improvement in hemoglobin.  Had some abdominal discomfort and nausea while drinking prep which has resolved now.  Risks (bleeding, infection, bowel perforation that could require surgery, sedation-related changes in cardiopulmonary systems), benefits (identification and possible treatment of source of symptoms, exclusion of certain causes of symptoms), and alternatives (watchful waiting, radiographic imaging studies, empiric medical treatment)  were explained to patient/family in detail and patient wishes to proceed.   Meghan Lynn

## 2020-06-21 NOTE — Anesthesia Postprocedure Evaluation (Signed)
Anesthesia Post Note  Patient: Norval Gable  Procedure(s) Performed: COLONOSCOPY WITH PROPOFOL (N/A ) POLYPECTOMY HOT HEMOSTASIS (ARGON PLASMA COAGULATION/BICAP) (N/A ) HEMOSTASIS CLIP PLACEMENT     Patient location during evaluation: Endoscopy Anesthesia Type: MAC Level of consciousness: awake and alert Pain management: pain level controlled Vital Signs Assessment: post-procedure vital signs reviewed and stable Respiratory status: spontaneous breathing, nonlabored ventilation and respiratory function stable Cardiovascular status: stable and blood pressure returned to baseline Postop Assessment: no apparent nausea or vomiting Anesthetic complications: no   No complications documented.  Last Vitals:  Vitals:   06/21/20 1206 06/21/20 1222  BP: (!) 145/87 (!) 154/81  Pulse: 79 83  Resp: 16 16  Temp:  37.1 C  SpO2: 100% 99%    Last Pain:  Vitals:   06/21/20 1222  TempSrc: Oral  PainSc:                  Mikah Poss,W. EDMOND

## 2020-06-21 NOTE — Progress Notes (Signed)
  Date: 06/21/2020  Patient name: [place] record number: [id number]  Date of birth: 08-30-1951   I have seen and evaluated Meghan Lynn and discussed their care with the Residency Team. Briefly, Meghan Lynn presented with Lower GI bleeding 4 days after colonoscopy with polypectomy, symptomatic anemia and required blood transfusion.  GI is consulted  And will repeat colonoscopy.   Vitals:   06/21/20 0519 06/21/20 1044  BP: 103/61 133/63  Pulse: 96 88  Resp: 16 18  Temp: 98.7 F (37.1 C) 98.8 F (37.1 C)  SpO2: 100% 95%   General: Awake, alert, NAD Eyes: no conjunctival pallor HENT: MMM CV: RR, NR, no murmur Pulm: CTAB, no wheezing Abd: Soft, NT, ND, +BS MSK: No LE edema, normal tone  Assessment and Plan: I have seen and evaluated the patient as outlined above. I agree with the formulated Assessment and Plan as detailed in the residents' note, with the following changes:   1. Lower GI bleeding - Source could be biopsy or polypectomy site or previously unseen site - Gi consulted, will take for colonoscopy today - Follow results - Monitor symptoms, telemetry - Follow up pathology  Other issues per Dr. Sammie Bench note.   Sid Falcon, MD 6/26/202111:44 AM

## 2020-06-21 NOTE — Care Management Obs Status (Signed)
[facility] NOTIFICATION   Patient Details  Name: Meghan Lynn MRN: [id number] Date of Birth: 09/26/1951   Medicare Observation Status Notification Given:  Yes    Claudie Leach, RN 06/21/2020, 4:40 PM

## 2020-06-21 NOTE — Op Note (Signed)
[facility] Patient Name: Meghan Lynn Procedure Date : 06/21/2020 MRN: [id number] Attending MD: Otis Brace , MD Date of Birth: [birth year] CSN: [id number] Age: 69 Admit Type: Inpatient Procedure:                Colonoscopy Indications:              Last colonoscopy one week ago, Rectal bleeding Providers:                Otis Brace, MD, Wynonia Sours, RN, Vista Lawman, RN, William Dalton, Technician Referring MD:              Medicines:                Sedation Administered by an Anesthesia Professional Complications:            No immediate complications. Estimated Blood Loss:     Estimated blood loss was minimal. Procedure:                Pre-Anesthesia Assessment:                           - Prior to the procedure, a History and Physical                            was performed, and patient medications and                            allergies were reviewed. The patient's tolerance of                            previous anesthesia was also reviewed. The risks                            and benefits of the procedure and the sedation                            options and risks were discussed with the patient.                            All questions were answered, and informed consent                            was obtained. Prior Anticoagulants: The patient has                            taken no previous anticoagulant or antiplatelet                            agents. ASA Grade Assessment: III - A patient with                            severe systemic disease. After reviewing the risks  and benefits, the patient was deemed in                            satisfactory condition to undergo the procedure.                           After obtaining informed consent, the colonoscope                            was passed under direct vision. Throughout the                            procedure, the patient's blood  pressure, pulse, and                            oxygen saturations were monitored continuously. The                            PCF-H190DL ([id number] Olympus pediatric colonscope                            was introduced through the anus and advanced to the                            the cecum, identified by appendiceal orifice and                            ileocecal valve. The colonoscopy was performed                            without difficulty. The patient tolerated the                            procedure well. The quality of the bowel                            preparation was fair. Scope In: 11:15:12 AM Scope Out: 11:40:45 AM Scope Withdrawal Time: 0 hours 18 minutes 21 seconds  Total Procedure Duration: 0 hours 25 minutes 33 seconds  Findings:      The perianal and digital rectal examinations were normal.      Hematin (altered blood/coffee-ground-like material) was found in the       entire colon. The quality of the bowel preparation was fair.      A single (solitary) five mm ulcer was found in the proximal ascending       colon (likely prior polypectomy site). No bleeding was present. Stigmata       of recent bleeding was present with pigmented spot. For hemostasis, one       hemostatic clip was successfully placed. There was no bleeding at the       end of the procedure.      A 5 mm polyp was found in the hepatic flexure. The polyp was sessile.       The polyp was removed with a cold snare. Resection and retrieval were       complete.  A 8 mm polyp was found in the descending colon. The polyp was sessile.       The polyp was removed with a hot snare. Resection and retrieval were       complete. To close a defect after polypectomy, one hemostatic clip was       successfully placed. There was no bleeding at the end of the procedure.      Two sessile polyps were found in the descending colon. The polyps were       diminutive in size. These polyps were removed with a cold  snare.       Resection and retrieval were complete.      Two polyps were found in the sigmoid colon. The polyps were diminutive       in size. These polyps were removed with a cold biopsy forceps. Resection       and retrieval were complete.      Internal hemorrhoids were found during retroflexion. The hemorrhoids       were small. Impression:               - Preparation of the colon was fair.                           - Blood in the entire examined colon.                           - A single (solitary) ulcer in the proximal                            ascending colon. Clip was placed.                           - One 5 mm polyp at the hepatic flexure, removed                            with a cold snare. Resected and retrieved.                           - One 8 mm polyp in the descending colon, removed                            with a hot snare. Resected and retrieved. Clip was                            placed.                           - Two diminutive polyps in the descending colon,                            removed with a cold snare. Resected and retrieved.                           - Two diminutive polyps in the sigmoid colon,  removed with a cold biopsy forceps. Resected and                            retrieved.                           - Internal hemorrhoids. Recommendation:           - Return patient to hospital ward for ongoing care.                           - Soft diet.                           - Continue present medications.                           - No aspirin, ibuprofen, naproxen, or other                            non-steroidal anti-inflammatory drugs for 2 weeks                            after polyp removal.                           - Await pathology results.                           - Repeat colonoscopy date to be determined after                            pending pathology results are reviewed for                             surveillance of multiple polyps. Procedure Code(s):        --- Professional ---                           [phone], 59, Colonoscopy, flexible; with control of                            bleeding, any method                           45385, Colonoscopy, flexible; with removal of                            tumor(s), polyp(s), or other lesion(s) by snare                            technique                           45380, 59, Colonoscopy, flexible; with biopsy,                            single or multiple  Diagnosis Code(s):        --- Professional ---                           K64.8, Other hemorrhoids                           K92.2, Gastrointestinal hemorrhage, unspecified                           K63.3, Ulcer of intestine                           K63.5, Polyp of colon                           K62.5, Hemorrhage of anus and rectum CPT copyright 2019 American Medical Association. All rights reserved. The codes documented in this report are preliminary and upon coder review may  be revised to meet current compliance requirements. Otis Brace, MD Otis Brace, MD 06/21/2020 11:51:05 AM Number of Addenda: 0

## 2020-06-21 NOTE — Brief Op Note (Signed)
06/20/2020 - 06/21/2020  11:54 AM  PATIENT:  Meghan Lynn  69 y.o. female  PRE-OPERATIVE DIAGNOSIS:  post-polypectomy bleeding  POST-OPERATIVE DIAGNOSIS:  colonoscopy: hepatic connection and descending colon, sigmoid colon polypectomy, no active bleeding  PROCEDURE:  Procedure(s): COLONOSCOPY WITH PROPOFOL (N/A) POLYPECTOMY HOT HEMOSTASIS (ARGON PLASMA COAGULATION/BICAP) (N/A)  SURGEON:  Surgeon(s) and Role:    * Natajah Derderian, MD - Primary  Findings ----------- -Colonoscopy showed fair prep and scattered old blood throughout the colon. -There was one ulcer in the proximal ascending colon with pigmented spot.  Most likely source of bleeding.  One clip was placed. -There was one 8 mm polyp in the descending colon which was removed with hot snare and one clip was placed. -Few other small polyps removed with cold snare and biopsy forceps.  Recommendations ------------------------ -Start soft diet -Avoid aspirin and NSAID for at least 10 to 14 days -Findings discussed with patient's daughter over the phone. -I think it would be reasonable to keep her overnight for observation -Repeat CBC in the morning -GI will follow  Otis Brace MD, [place] 06/21/2020, 11:56 AM  Contact #  [phone]

## 2020-06-22 DIAGNOSIS — D72829 Elevated white blood cell count, unspecified: Secondary | ICD-10-CM | POA: Diagnosis present

## 2020-06-22 DIAGNOSIS — Z83438 Family history of other disorder of lipoprotein metabolism and other lipidemia: Secondary | ICD-10-CM | POA: Diagnosis not present

## 2020-06-22 DIAGNOSIS — D125 Benign neoplasm of sigmoid colon: Secondary | ICD-10-CM | POA: Diagnosis not present

## 2020-06-22 DIAGNOSIS — D123 Benign neoplasm of transverse colon: Secondary | ICD-10-CM | POA: Diagnosis not present

## 2020-06-22 DIAGNOSIS — K2950 Unspecified chronic gastritis without bleeding: Secondary | ICD-10-CM | POA: Diagnosis present

## 2020-06-22 DIAGNOSIS — K91840 Postprocedural hemorrhage and hematoma of a digestive system organ or structure following a digestive system procedure: Secondary | ICD-10-CM | POA: Diagnosis not present

## 2020-06-22 DIAGNOSIS — G43909 Migraine, unspecified, not intractable, without status migrainosus: Secondary | ICD-10-CM | POA: Diagnosis present

## 2020-06-22 DIAGNOSIS — Z8701 Personal history of pneumonia (recurrent): Secondary | ICD-10-CM | POA: Diagnosis not present

## 2020-06-22 DIAGNOSIS — K625 Hemorrhage of anus and rectum: Secondary | ICD-10-CM | POA: Diagnosis present

## 2020-06-22 DIAGNOSIS — K219 Gastro-esophageal reflux disease without esophagitis: Secondary | ICD-10-CM | POA: Diagnosis present

## 2020-06-22 DIAGNOSIS — M1990 Unspecified osteoarthritis, unspecified site: Secondary | ICD-10-CM | POA: Diagnosis present

## 2020-06-22 DIAGNOSIS — Z86010 Personal history of colonic polyps: Secondary | ICD-10-CM | POA: Diagnosis not present

## 2020-06-22 DIAGNOSIS — D124 Benign neoplasm of descending colon: Secondary | ICD-10-CM | POA: Diagnosis not present

## 2020-06-22 DIAGNOSIS — Z8711 Personal history of peptic ulcer disease: Secondary | ICD-10-CM | POA: Diagnosis not present

## 2020-06-22 DIAGNOSIS — Z20822 Contact with and (suspected) exposure to covid-19: Secondary | ICD-10-CM | POA: Diagnosis not present

## 2020-06-22 DIAGNOSIS — Y838 Other surgical procedures as the cause of abnormal reaction of the patient, or of later complication, without mention of misadventure at the time of the procedure: Secondary | ICD-10-CM | POA: Diagnosis not present

## 2020-06-22 DIAGNOSIS — J45909 Unspecified asthma, uncomplicated: Secondary | ICD-10-CM | POA: Diagnosis present

## 2020-06-22 DIAGNOSIS — I2510 Atherosclerotic heart disease of native coronary artery without angina pectoris: Secondary | ICD-10-CM | POA: Diagnosis not present

## 2020-06-22 DIAGNOSIS — Z9861 Coronary angioplasty status: Secondary | ICD-10-CM | POA: Diagnosis not present

## 2020-06-22 DIAGNOSIS — K648 Other hemorrhoids: Secondary | ICD-10-CM | POA: Diagnosis not present

## 2020-06-22 DIAGNOSIS — E785 Hyperlipidemia, unspecified: Secondary | ICD-10-CM | POA: Diagnosis present

## 2020-06-22 DIAGNOSIS — D62 Acute posthemorrhagic anemia: Secondary | ICD-10-CM | POA: Diagnosis not present

## 2020-06-22 DIAGNOSIS — F17210 Nicotine dependence, cigarettes, uncomplicated: Secondary | ICD-10-CM | POA: Diagnosis present

## 2020-06-22 DIAGNOSIS — Z8249 Family history of ischemic heart disease and other diseases of the circulatory system: Secondary | ICD-10-CM | POA: Diagnosis not present

## 2020-06-22 DIAGNOSIS — K633 Ulcer of intestine: Secondary | ICD-10-CM | POA: Diagnosis not present

## 2020-06-22 DIAGNOSIS — I10 Essential (primary) hypertension: Secondary | ICD-10-CM | POA: Diagnosis present

## 2020-06-22 LAB — COMPREHENSIVE METABOLIC PANEL
ALT: 20 U/L (ref 0–44)
AST: 20 U/L (ref 15–41)
Albumin: 2.9 g/dL — ABNORMAL LOW (ref 3.5–5.0)
Alkaline Phosphatase: 52 U/L (ref 38–126)
Anion gap: 8 (ref 5–15)
BUN: 7 mg/dL — ABNORMAL LOW (ref 8–23)
CO2: 25 mmol/L (ref 22–32)
Calcium: 8.9 mg/dL (ref 8.9–10.3)
Chloride: 109 mmol/L (ref 98–111)
Creatinine, Ser: 0.95 mg/dL (ref 0.44–1.00)
GFR calc Af Amer: >60 mL/min (ref >60)
GFR calc non Af Amer: >60 mL/min (ref >60)
Glucose, Bld: 110 mg/dL — ABNORMAL HIGH (ref 70–99)
Potassium: 3.6 mmol/L (ref 3.5–5.1)
Sodium: 142 mmol/L (ref 135–145)
Total Bilirubin: 0.8 mg/dL (ref 0.3–1.2)
Total Protein: 5.4 g/dL — ABNORMAL LOW (ref 6.5–8.1)

## 2020-06-22 LAB — TYPE AND SCREEN
ABO/RH(D): O POS
Antibody Screen: NEGATIVE
Unit division: 0

## 2020-06-22 LAB — BPAM RBC
Blood Product Expiration Date: 202107152359
ISSUE DATE / TIME: 202106260147
Unit Type and Rh: 5100

## 2020-06-22 LAB — CBC
HCT: 35.6 % — ABNORMAL LOW (ref 36.0–46.0)
Hemoglobin: 11.5 g/dL — ABNORMAL LOW (ref 12.0–15.0)
MCH: 26.2 pg (ref 26.0–34.0)
MCHC: 32.3 g/dL (ref 30.0–36.0)
MCV: 81.1 fL (ref 80.0–100.0)
Platelets: 213 10*3/uL (ref 150–400)
RBC: 4.39 MIL/uL (ref 3.87–5.11)
RDW: 15.1 % (ref 11.5–15.5)
WBC: 12.7 10*3/uL — ABNORMAL HIGH (ref 4.0–10.5)
nRBC: 0 % (ref 0.0–0.2)

## 2020-06-22 NOTE — Progress Notes (Signed)
   Subjective:   Seen on rounds today and she is doing well. She denies dizziness, shortness of breath, chest pressure, and nausea. She has not yet had a BM but has not had any further bleeding as well.   Discussed that there may be a small amount of blood in her BM but if she continues to have BM she would need to return to the ER  Objective:  Vital signs in last 24 hours: Vitals:   06/21/20 1206 06/21/20 1222 06/21/20 2016 06/22/20 0439  BP: (!) 145/87 (!) 154/81 127/69 125/82  Pulse: 79 83 88 87  Resp: 16 16 18 17   Temp:  98.7 F (37.1 C) 98.6 F (37 C) 98.3 F (36.8 C)  TempSrc:  Oral    SpO2: 100% 99% 97% 98%  Weight:      Height:        Constitution: NAD, appears stated age Eyes: no icterus or injection  Cardio: RRR, no m/r/g, no LE edema  Abdominal: NTTP, soft, non-distended, normal BS Neuro: normal affect, a&ox3 Skin: c/d/i   Assessment/Plan:  Active Problems:   Lower GI bleed   Symptomatic anemia  Meghan Lynn is a 69 y.o. with a pertinent PMH of  PMH of tobacco-use disorder, CAD (s/p 3x in RCA), GERD, HLD, PUD, who presented with 4 day hx. of BRBPR with associated shortness of breath, fatigue, and dizziness s/p colonoscopy and admitted for GI bleed with symptomatic anemia.   Lower GI bleed 2/2 Polypectomy Colonoscopy yesterday with clip placed over bleeding ulcer. Several other polyps were removed during the colonoscopy. She was kept overnight for observation. She has not had any additional bleeding. Hemoglobin (12.1 > 11.5) is stable and she will follow-up with her PCP later this week.   -  F/u with GI  - f/u with PCP later this week for repeat CBC. Return precautions given  - advance diet to regular   CAD s/p x3 DES RCA - will need to hold ASA for two weeks   HTN - can resume home BP medications   VTE: SCDs IVF: none Diet: regular  Code: full   Dispo: Anticipated discharge today.   Lenny Fiumara A, DO 06/22/2020, 6:55 AM Pager:  [phone] After 5pm on weekdays and 1pm on weekends: On Call Pager: [phone]

## 2020-06-22 NOTE — Discharge Summary (Signed)
Name: Meghan Lynn MRN: [id number] DOB: 1951-11-29 69 y.o. PCP: Rutherford Guys, MD  Date of Admission: 06/20/2020 10:59 AM Date of Discharge: 06/22/2020 Attending Physician: Dr. Daryll Drown  Discharge Diagnosis: 1. Lower GI Bleed 2. Symptomatic Anemia   Discharge Medications: Allergies as of 06/22/2020      Reactions   Propoxyphene Nausea And Vomiting   Darvocet   Latex Itching      Medication List    STOP taking these medications   aspirin EC 81 MG tablet   fluconazole 150 MG tablet Commonly known as: DIFLUCAN   GOODY HEADACHE PO   ibuprofen 200 MG tablet Commonly known as: ADVIL     TAKE these medications   Biotin 1 MG Caps Take 1 mg by mouth daily.   hydrochlorothiazide 12.5 MG tablet Commonly known as: HYDRODIURIL Take 1 tablet (12.5 mg total) by mouth daily.   metoprolol succinate 25 MG 24 hr tablet Commonly known as: TOPROL-XL TAKE 1 TABLET BY MOUTH EVERY DAY   multivitamin with minerals Tabs tablet Take 1 tablet by mouth daily.   omeprazole 20 MG capsule Commonly known as: PRILOSEC Take 20 mg by mouth daily.   valsartan 320 MG tablet Commonly known as: DIOVAN TAKE 1 TABLET BY MOUTH EVERY DAY       Disposition and follow-up:   Ms.Morganne A Schwall was discharged from [facility] in Good condition.  At the hospital follow up visit please address:  1.  Follow up:  A. GI bleed - make sure patient has follow up set up with GI, Needs another transfusion of feraheme. Repeat CBC  B. HTN - make sure patient has restarted her BP medications.  2.  Labs / imaging needed at time of follow-up: CBC, BMP  3.  Pending labs/ test needing follow-up: none  Follow-up Appointments:   Hospital Course by problem list: 1. Lower GI bleed 2/2 Polypectomy - Patient presented to [facility] with  Lower GI bleed for a week after having a coloscopy with polypectomy in the OP setting. Patient received 1 units PRBCs with improvement of her anemia symptoms.  Patient had a repeat colonoscopy showing a single (solitary) ulcer in the proximal ascending colon. Clip was placed. Patient had resolution of her hematochezia and stable f/u Hgb.   Discharge Vitals:   BP 125/82 (BP Location: Right Arm)   Pulse 87   Temp 98.3 F (36.8 C)   Resp 17   Ht 5\' 1"  (1.549 m)   Wt 69.4 kg   SpO2 98%   BMI 28.91 kg/m   Pertinent Labs, Studies, and Procedures:  CBC Latest Ref Rng & Units 06/22/2020 06/21/2020 06/20/2020  WBC 4.0 - 10.5 K/uL 12.7(H) 13.5(H) 16.1(H)  Hemoglobin 12.0 - 15.0 g/dL 11.5(L) 12.1 9.9(L)  Hematocrit 36 - 46 % 35.6(L) 37.6 31.2(L)  Platelets 150 - 400 K/uL 213 209 217   CMP Latest Ref Rng & Units 06/22/2020 06/21/2020 06/20/2020  Glucose 70 - 99 mg/dL 110(H) 130(H) 141(H)  BUN 8 - 23 mg/dL 7(L) 6(L) 13  Creatinine 0.44 - 1.00 mg/dL 0.95 0.88 0.84  Sodium 135 - 145 mmol/L 142 142 140  Potassium 3.5 - 5.1 mmol/L 3.6 3.8 3.9  Chloride 98 - 111 mmol/L 109 106 107  CO2 22 - 32 mmol/L 25 25 25   Calcium 8.9 - 10.3 mg/dL 8.9 8.9 8.9  Total Protein 6.5 - 8.1 g/dL 5.4(L) - 5.3(L)  Total Bilirubin 0.3 - 1.2 mg/dL 0.8 - 0.3  Alkaline Phos 38 -  126 U/L 52 - 52  AST 15 - 41 U/L 20 - 25  ALT 0 - 44 U/L 20 - 22    Colonoscopy:  Impression: - Preparation of the colon was fair. - Blood in the entire examined colon. - A single (solitary) ulcer in the proximal ascending colon. Clip was placed. - One 5 mm polyp at the hepatic flexure, removed with a cold snare. Resected and retrieved. - One 8 mm polyp in the descending colon, removed with a hot snare. Resected and retrieved. Clip was placed. - Two diminutive polyps in the descending colon, removed with a cold snare. Resected and retrieved. - Two diminutive polyps in the sigmoid colon, removed with a cold biopsy forceps. Resected and retrieved. - Internal hemorrhoids.  Recommendation: - Return patient to hospital ward for ongoing care. - Soft diet. - Continue present medications. - No  aspirin, ibuprofen, naproxen, or other non-steroidal anti-inflammatory drugs for 2 weeks after polyp removal. - Await pathology results. - Repeat colonoscopy date to be determined after pending pathology results are reviewed for surveillance of multiple polyps.  Findings: Hematin (altered blood/coffee-ground-like material) was found in the entire colon. The quality of the bowel preparation was fair. A single (solitary) five mm ulcer was found in the proximal ascending colon (likely prior polypectomy site). No bleeding was present. Stigmata of recent bleeding was present with pigmented spot. For hemostasis, one hemostatic clip was successfully placed. There was no bleeding at the end of the procedure. A 5 mm polyp was found in the hepatic flexure. The polyp was sessile. The polyp was removed with a A 8 mm polyp was found in the descending colon. The polyp was sessile. The polyp was removed with a hot snare. Resection and retrieval were complete. To close a defect after polypectomy, one hemostatic clip was successfully placed. There was no bleeding at the end of the procedure. Two sessile polyps were found in the descending colon. The polyps were diminutive in size. These polyps were removed with a cold snare. Resection and retrieval were complete. Two polyps were found in the sigmoid colon. The polyps were diminutive in size. These polyps were removed with a cold biopsy forceps. Resection and retrieval were complete. Internal hemorrhoids were found during retroflexion. The hemorrhoids were small. Discharge Instructions: Discharge Instructions    Diet - low sodium heart healthy   Complete by: As directed    Discharge instructions   Complete by: As directed    You were hospitalized for lower GI bleed. Thank you for allowing [place] to be part of your care.   Please follow-up with your primary care provider later this week for labs.   Please note these changes made to your medications:    Please DO NOT TAKE aspirin 81 mg, goody powders, ibuprofen, or other NSAID medications for the next two weeks as this can increase your risk of bleeding. You can take tylenol if needed.   After two weeks, you can resume your daily aspirin.   Increase activity slowly   Complete by: As directed       Signed: Marianna Payment, MD 06/24/2020, 5:53 AM   Pager: [phone]

## 2020-06-22 NOTE — Plan of Care (Signed)
D/c to home with daughter VSS pain denied breathing regular and unlabored on Room air

## 2020-06-23 ENCOUNTER — Telehealth: Payer: Self-pay | Admitting: Family Medicine

## 2020-06-23 ENCOUNTER — Encounter (HOSPITAL_COMMUNITY): Payer: Self-pay | Admitting: Gastroenterology

## 2020-06-23 ENCOUNTER — Telehealth: Payer: Self-pay | Admitting: Internal Medicine

## 2020-06-23 DIAGNOSIS — Z1211 Encounter for screening for malignant neoplasm of colon: Secondary | ICD-10-CM

## 2020-06-23 DIAGNOSIS — Z8719 Personal history of other diseases of the digestive system: Secondary | ICD-10-CM

## 2020-06-23 NOTE — Telephone Encounter (Signed)
Atorvastatin NOT on current medication list and discharge note remains incomplete.   Noted patient on atorvastatin for few years prior to GI bleed. DR Pamella Pert adjusted therapy on 01/2020, please contact prescribed for further recommendations.

## 2020-06-23 NOTE — Telephone Encounter (Signed)
  Pt c/o medication issue:  1. Name of Medication:   atorvastatin (LIPITOR) 20 MG tablet     2. How are you currently taking this medication (dosage and times per day)? 1 time daily  3. Are you having a reaction (difficulty breathing--STAT)?  NA  4. What is your medication issue? Patient would like to know if she is supposed to be taking this medication or not. She states it makes her have frequent bowel movements and she was just in the hospital due to a GI bleed.

## 2020-06-23 NOTE — Telephone Encounter (Signed)
Returned cal to pt notified that this medication is not on her medication list and will need to call PCP for more information. She states that this call is not for frequent bowel movements, she is not having any bowel movement right no, she just had a colonoscopy and that "cleaned her out". She will call PCP. She states she stopped the HCTZ, should she be taking? Should we be taking, she states that this makes her have leg cramps. She states that she will makesure she stays hydrated. She states that she will call GI. She states that she stopped the valsartan due to GI bleed she states that she will take today she "feels like her BP is high" she will re-start today.

## 2020-06-23 NOTE — Telephone Encounter (Signed)
Pt called and stated she is needing another referral to a different gastro Doctor. Please advise.

## 2020-06-24 LAB — SURGICAL PATHOLOGY

## 2020-06-25 ENCOUNTER — Ambulatory Visit: Payer: Self-pay | Admitting: *Deleted

## 2020-06-25 ENCOUNTER — Telehealth: Payer: Self-pay | Admitting: Family Medicine

## 2020-06-25 NOTE — Telephone Encounter (Signed)
Pt called with complaints of SOB and chst heaviness with exertion; her symptoms started on 06/22/20; she had colonoscopy on and 06/16/20 and 06/21/20 due to multiple bloody stools; her last bloody stool was 06/22/20; the pt states she did receive a transfusion; recommendations made per nurse triage protocol; she verbalized understanding,and will go to urgent care; the pt states she has not received a call to schedule her hospital follow up appt; she is seen by Dr Pamella Pert, [place]; the pt can be contacted at [phone]; will route to office for notification. Reason for Disposition . [1] MILD difficulty breathing (e.g., minimal/no SOB at rest, SOB with walking, pulse <100) AND [2] NEW-onset or WORSE than normal  Answer Assessment - Initial Assessment Questions 1. RESPIRATORY STATUS: "Describe your breathing?" (e.g., wheezing, shortness of breath, unable to speak, severe coughing)      Shortness of breath 2. ONSET: "When did this breathing problem begin?"      06/22/20 3. PATTERN "Does the difficult breathing come and go, or has it been constant since it started?"      Intermittent 4. SEVERITY: "How bad is your breathing?" (e.g., mild, moderate, severe)    - MILD: No SOB at rest, mild SOB with walking, speaks normally in sentences, can lay down, no retractions, pulse < 100.    - MODERATE: SOB at rest, SOB with minimal exertion and prefers to sit, cannot lie down flat, speaks in phrases, mild retractions, audible wheezing, pulse 100-120.    - SEVERE: Very SOB at rest, speaks in single words, struggling to breathe, sitting hunched forward, retractions, pulse > 120    Mild; occurs with exertion 5. RECURRENT SYMPTOM: "Have you had difficulty breathing before?" If Yes, ask: "When was the last time?" and "What happened that time?"      no 6. CARDIAC HISTORY: "Do you have any history of heart disease?" (e.g., heart attack, angina, bypass surgery, angioplasty)      multiple stents 7. LUNG HISTORY: "Do you have  any history of lung disease?"  (e.g., pulmonary embolus, asthma, emphysema)     Hx bronchitis and pneumonia 8. CAUSE: "What do you think is causing the breathing problem?"    no 9. OTHER SYMPTOMS: "Do you have any other symptoms? (e.g., dizziness, runny nose, cough, chest pain, fever)     Chest tightness 10. PREGNANCY: "Is there any chance you are pregnant?" "When was your last menstrual period?" no 11. TRAVEL: "Have you traveled out of the country in the last month?" (e.g., travel history, exposures)     no  Protocols used: BREATHING DIFFICULTY-A-AH

## 2020-06-25 NOTE — Telephone Encounter (Signed)
Pt was returning [place] message regarding that telephone incounter. Pt stated she will look at her messages to see what was said. Please advise.

## 2020-06-25 NOTE — Telephone Encounter (Signed)
Dr Pamella Pert, called to check on patient and to let her know you were out of the office today (msg was left in machine) I did leave on the msg that I do recommend for her to go to the ED [facility] Urgent care for her SOB and Heaviness in the chest. Patient is schedule for a hospital f/u on 07/07/20.

## 2020-06-26 ENCOUNTER — Other Ambulatory Visit: Payer: Self-pay

## 2020-06-26 NOTE — Patient Outreach (Signed)
[facility]) Care Management  06/26/2020  VIRGILIA QUIGG May 14, 1951 [id number]   Red Emmi: Date of call: 06/25/20 Reason for red alert:   Scheduled follow up:   No                                      Other questions or concerns:    Yes   Placed call to patient and reviewed reason for call. Patient reports she has her scheduled follow up with Primary MD and she is also making an appointment with cardiology.   Other questions:  Patient denies additional questions.  Patient denies any additional bleeding. Reports she has had some shortness of breath and chest heaviness.  She call MD office and was advised to go to the emergency department but declined to go.  I reviewed with patient the importance of close observation of bleeding and sometimes bleeding can cause CP and SOB.  Patient voiced understanding. I encouraged patient to call MD for change or worsening sign and symptoms.  PLAN: close Emmi case as no needs identified.  Tomasa Rand, RN, BSN, CEN [facility] [employer] [phone]

## 2020-07-01 ENCOUNTER — Telehealth: Payer: Self-pay | Admitting: Family Medicine

## 2020-07-01 NOTE — Telephone Encounter (Signed)
Meghan Lynn with  [facility] calling , Dr.Karki is requesting that Dr. Pamella Pert order some labs while pt is  Here. On her HFU coming up. dx fatigue and  anemia   Orders would be CBC,  iron and ferrerritn    If possible if not please call Meghan Lynn at  [phone]

## 2020-07-01 NOTE — Telephone Encounter (Signed)
"  Meghan Lynn with  [facility] calling , Dr.Karki is requesting that Dr. Pamella Pert order some labs while pt is  Here. On her HFU coming up. dx fatigue and  anemia    Orders would be CBC,  iron and ferrerritn "    Is this acceptable or would you prefer these labs done /ordered elsewhere?

## 2020-07-03 NOTE — Telephone Encounter (Signed)
Will order when I see her on July 07 2020

## 2020-07-07 ENCOUNTER — Ambulatory Visit (INDEPENDENT_AMBULATORY_CARE_PROVIDER_SITE_OTHER): Payer: Medicare HMO | Admitting: Family Medicine

## 2020-07-07 ENCOUNTER — Encounter: Payer: Self-pay | Admitting: Family Medicine

## 2020-07-07 ENCOUNTER — Other Ambulatory Visit: Payer: Self-pay

## 2020-07-07 VITALS — BP 145/84 | HR 89 | Temp 97.2°F | Ht 61.0 in | Wt 145.0 lb

## 2020-07-07 DIAGNOSIS — R1319 Other dysphagia: Secondary | ICD-10-CM

## 2020-07-07 DIAGNOSIS — Z8719 Personal history of other diseases of the digestive system: Secondary | ICD-10-CM | POA: Diagnosis not present

## 2020-07-07 DIAGNOSIS — R0600 Dyspnea, unspecified: Secondary | ICD-10-CM | POA: Diagnosis not present

## 2020-07-07 DIAGNOSIS — R0609 Other forms of dyspnea: Secondary | ICD-10-CM

## 2020-07-07 DIAGNOSIS — R1310 Dysphagia, unspecified: Secondary | ICD-10-CM

## 2020-07-07 DIAGNOSIS — I10 Essential (primary) hypertension: Secondary | ICD-10-CM | POA: Diagnosis not present

## 2020-07-07 LAB — POCT CBC
Granulocyte percent: 66.6 %G (ref 37–80)
HCT, POC: 41.9 % — AB (ref 29–41)
Hemoglobin: 13.5 g/dL (ref 11–14.6)
Lymph, poc: 3 (ref 0.6–3.4)
MCH, POC: 26.5 pg — AB (ref 27–31.2)
MCHC: 32.2 g/dL (ref 31.8–35.4)
MCV: 82.4 fL (ref 76–111)
MID (cbc): 0.4 (ref 0–0.9)
MPV: 7.3 fL (ref 0–99.8)
POC Granulocyte: 6.8 (ref 2–6.9)
POC LYMPH PERCENT: 29.9 %L (ref 10–50)
POC MID %: 4.1 %M (ref 0–12)
Platelet Count, POC: 283 10*3/uL (ref 142–424)
RBC: 5.09 M/uL (ref 4.04–5.48)
RDW, POC: 16.8 %
WBC: 10.2 10*3/uL (ref 4.6–10.2)

## 2020-07-07 MED ORDER — ATORVASTATIN CALCIUM 40 MG PO TABS
40.0000 mg | ORAL_TABLET | Freq: Every day | ORAL | 3 refills | Status: DC
Start: 2020-07-07 — End: 2021-03-19

## 2020-07-07 NOTE — Patient Instructions (Signed)
° ° ° °  If you have lab work done today you will be contacted with your lab results within the next 2 weeks.  If you have not heard from us then please contact us. The fastest way to get your results is to register for My Chart. ° ° °IF you received an x-ray today, you will receive an invoice from [facility]. Please contact [facility] at [phone] with questions or concerns regarding your invoice.  ° °IF you received labwork today, you will receive an invoice from [facility]. Please contact [facility] at [phone] with questions or concerns regarding your invoice.  ° °Our billing staff will not be able to assist you with questions regarding bills from these companies. ° °You will be contacted with the lab results as soon as they are available. The fastest way to get your results is to activate your My Chart account. Instructions are located on the last page of this paperwork. If you have not heard from us regarding the results in 2 weeks, please contact this office. °  ° ° ° °

## 2020-07-07 NOTE — Progress Notes (Signed)
7/12/20214:24 PM  Meghan Lynn 1951/02/06, 69 y.o., female [id number]  Chief Complaint  Patient presents with  . Hospitalization Follow-up    colonoscopy and dialation. Pt says this visit turn into horror, Had to return to er for bleeding and 6 more polyps to be removed. Was given lipitor 20mg , not taking for body cramping. Now feeling fatigue and doing lots of belching. Told that excessive belching should not happen after dialation    HPI:   Patient is a 69 y.o. female with past medical history significant for HTN, CAD s/p stents, PAD, HLP, pyloric stenosis and smoker who presents today for hosp fullowup  hosp 6/25-27 for lower GI with symptomatic anemia from polypectomy during routine colonoscopy/EGD on June 21 transfused 1prbc She had a repeat colonoscopy that showed single ulcer which was clipped and more polyps which were removed, bleeding resolved, hgb stable - she denies any more bleeding BP meds were stopped during hosp - she has resumed  Patient had esophageal dilatation on June 21st for severe pyloric stenosis - she is feeling worse since then, more bloating, feels food getting stuck, nausea, rare vomting Requesting new GI   Path - tubular adenoma  lipitor increase to 40mg  a day in feb 2021 - had cramping but she was taking 60mg   Having DOE with going upstairs since GI bleed Needs to stop and sit down to rest which resolve symptoms Has not seen cards for this  CBC Latest Ref Rng & Units 07/07/2020 06/22/2020 06/21/2020  WBC 4.6 - 10.2 K/uL 10.2 12.7(H) 13.5(H)  Hemoglobin 11 - 14.6 g/dL 13.5 11.5(L) 12.1  Hematocrit 29 - 41 % 41.9(A) 35.6(L) 37.6  Platelets 150 - 400 K/uL - 213 209    Depression screen [facility] 2/9 04/14/2020 03/13/2020 01/31/2020  Decreased Interest 0 0 0  Down, Depressed, Hopeless 0 0 0  PHQ - 2 Score 0 0 0    Fall Risk  07/07/2020 04/14/2020 03/13/2020 01/31/2020 12/26/2019  Falls in the past year? 0 0 0 0 0  Number falls in past yr: 0 0 0 0 0  Injury  with Fall? 0 0 0 0 0  Follow up - Falls evaluation completed;Education provided Falls evaluation completed - Falls evaluation completed     Allergies  Allergen Reactions  . Propoxyphene Nausea And Vomiting    Darvocet  . Latex Itching    Prior to Admission medications   Medication Sig Start Date End Date Taking? Authorizing Provider  Biotin 1 MG CAPS Take 1 mg by mouth daily.    Yes [provider]  metoprolol succinate (TOPROL-XL) 25 MG 24 hr tablet TAKE 1 TABLET BY MOUTH EVERY DAY Patient taking differently: Take 25 mg by mouth daily.  12/17/19  Yes Hilty, Nadean Corwin, MD  Multiple Vitamin (MULTIVITAMIN WITH MINERALS) TABS Take 1 tablet by mouth daily.   Yes [provider]  omeprazole (PRILOSEC) 20 MG capsule Take 20 mg by mouth daily.   Yes [provider]  valsartan (DIOVAN) 320 MG tablet TAKE 1 TABLET BY MOUTH EVERY DAY Patient taking differently: Take 320 mg by mouth daily.  03/14/20  Yes Hilty, Nadean Corwin, MD    Past Medical History:  Diagnosis Date  . Active smoker   . Anginal pain ([place])   . Arthritis   . Asthma    patient denies  . Bronchitis due to tobacco use   . Burning sensation of feet   . Coronary artery disease    3 stents to RCA  .  Dilatation of esophagus   . Gastritis   . GERD (gastroesophageal reflux disease)   . 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 Headache, migraine    daily  . Hyperlipidemia   . Hypertension   . Peptic ulcer disease   . Pneumonia   . Shortness of breath dyspnea    with exertion    Past Surgical History:  Procedure Laterality Date  . BACK SURGERY    . BALLOON DILATION N/A 05/30/2013   Procedure: BALLOON DILATION;  Surgeon: Missy Sabins, MD;  Location[facility] ENDOSCOPY;  Service: Endoscopy;  Laterality: N/A;  . BALLOON DILATION N/A 08/02/2013   Procedure: BALLOON DILATION;  Surgeon: Missy Sabins, MD;  Location: [place];  Service: Endoscopy;  Laterality: N/A;  . BALLOON DILATION N/A 04/29/2016    Procedure: BALLOON DILATION;  Surgeon: Teena Irani, MD;  Location: [facility] ENDOSCOPY;  Service: Endoscopy;  Laterality: N/A;  . BALLOON DILATION N/A 06/16/2020   Procedure: BALLOON DILATION;  Surgeon: Ronnette Juniper, MD;  Location: [place];  Service: Gastroenterology;  Laterality: N/A;  . BIOPSY  06/16/2020   Procedure: BIOPSY;  Surgeon: Ronnette Juniper, MD;  Location: [place];  Service: Gastroenterology;;  . CARDIAC CATHETERIZATION  03/07/2002   moderte CAD 80% (mid RCA) - Dr. Myrtice Lauth  . CARDIAC CATHETERIZATION  09/13/2003   Cypher 2.5x30mm and 2.5x18 and Taxus 2.75x72mm to distal, mid, prox RCA (Dr. Gerrie Nordmann)  . CARDIOLITE MYOCARDIAL PERFUSION STUDY  05/2006   positive bruce protocol, low risk, EF 80%  . CARDIOVASCULAR STRESS TEST  2010  . COLONOSCOPY    . COLONOSCOPY WITH PROPOFOL N/A 06/16/2020   Procedure: COLONOSCOPY WITH PROPOFOL;  Surgeon: Ronnette Juniper, MD;  Location: [place];  Service: Gastroenterology;  Laterality: N/A;  . COLONOSCOPY WITH PROPOFOL N/A 06/21/2020   Procedure: COLONOSCOPY WITH PROPOFOL;  Surgeon: Otis Brace, MD;  Location: [place];  Service: Gastroenterology;  Laterality: N/A;  . CORONARY ANGIOPLASTY  2004   stents x 3  . DILATATION & CURETTAGE/HYSTEROSCOPY WITH MYOSURE N/A 08/19/2016   Procedure: DILATATION & CURETTAGE/HYSTEROSCOPY WITH MYOSURE;  Surgeon: Servando Salina, MD;  Location: [facility];  Service: Gynecology;  Laterality: N/A;  . ESOPHAGOGASTRODUODENOSCOPY N/A 05/30/2013   Procedure: ESOPHAGOGASTRODUODENOSCOPY (EGD);  Surgeon: Missy Sabins, MD;  Location[facility] ENDOSCOPY;  Service: Endoscopy;  Laterality: N/A;  . ESOPHAGOGASTRODUODENOSCOPY N/A 08/02/2013   Procedure: ESOPHAGOGASTRODUODENOSCOPY (EGD);  Surgeon: Missy Sabins, MD;  Location: [facility] ENDOSCOPY;  Service: Endoscopy;  Laterality: N/A;  barb Greggory [name]  . ESOPHAGOGASTRODUODENOSCOPY (EGD) WITH PROPOFOL N/A 04/29/2016   Procedure: ESOPHAGOGASTRODUODENOSCOPY (EGD) WITH PROPOFOL;  Surgeon: Teena Irani, MD;   Location: [facility] ENDOSCOPY;  Service: Endoscopy;  Laterality: N/A;  . ESOPHAGOGASTRODUODENOSCOPY (EGD) WITH PROPOFOL N/A 06/16/2020   Procedure: ESOPHAGOGASTRODUODENOSCOPY (EGD) WITH PROPOFOL;  Surgeon: Ronnette Juniper, MD;  Location: [place];  Service: Gastroenterology;  Laterality: N/A;  . FRACTURE SURGERY    . HEMOSTASIS CLIP PLACEMENT  06/21/2020   Procedure: HEMOSTASIS CLIP PLACEMENT;  Surgeon: Otis Brace, MD;  Location: [place] ENDOSCOPY;  Service: Gastroenterology;;  . ORIF ELBOW FRACTURE  2006  . POLYPECTOMY  06/16/2020   Procedure: POLYPECTOMY;  Surgeon: Ronnette Juniper, MD;  Location: [place];  Service: Gastroenterology;;  . POLYPECTOMY  06/21/2020   Procedure: POLYPECTOMY;  Surgeon: Otis Brace, MD;  Location: [place] ENDOSCOPY;  Service: Gastroenterology;;  . TRANSTHORACIC ECHOCARDIOGRAM  07/2008   normal LV function, mild MR, mild TR, trace pulm valve regurg    Social History   Tobacco Use  . Smoking  status: Current Every Day Smoker    Packs/day: 0.60    Years: 45.00    Pack years: 27.00    Types: Cigarettes  . Smokeless tobacco: Never Used  Substance Use Topics  . Alcohol use: Yes    Alcohol/week: 0.0 standard drinks    Comment: occ    Family History  Problem Relation Age of Onset  . Diabetes Mother   . CAD Mother        CABG  . Bone cancer Father   . Hypertension Father   . Heart disease Brother   . Kidney disease Sister   . Cancer Sister   . Diabetes Sister   . Hyperlipidemia Sister   . Hypertension Sister     Review of Systems  Constitutional: Negative for chills and fever.  Respiratory: Positive for shortness of breath. Negative for cough.   Cardiovascular: Negative for chest pain, palpitations and leg swelling.  Gastrointestinal: Positive for abdominal pain, constipation and nausea. Negative for blood in stool and melena.  per hpi   OBJECTIVE:  Today's Vitals   07/07/20 1615  BP: (!) 145/84  Pulse: 89  Temp: (!) 97.2 F (36.2 C)  SpO2: 100%   Weight: 145 lb (65.8 kg)  Height: 5\' 1"  (1.549 m)   Body mass index is 27.4 kg/m.   Physical Exam Vitals and nursing note reviewed.  Constitutional:      Appearance: She is well-developed.  HENT:     Head: Normocephalic and atraumatic.     Mouth/Throat:     Pharynx: No oropharyngeal exudate.  Eyes:     General: No scleral icterus.    Conjunctiva/sclera: Conjunctivae normal.     Pupils: Pupils are equal, round, and reactive to light.  Cardiovascular:     Rate and Rhythm: Normal rate and regular rhythm.     Heart sounds: Normal heart sounds. No murmur heard.  No friction rub. No gallop.   Pulmonary:     Effort: Pulmonary effort is normal.     Breath sounds: Normal breath sounds. No wheezing, rhonchi or rales.  Abdominal:     General: Bowel sounds are normal. There is distension.     Palpations: Abdomen is soft.     Tenderness: There is abdominal tenderness (mild epigasrtic). There is no guarding or rebound.  Musculoskeletal:     Cervical back: Neck supple.  Skin:    General: Skin is warm and dry.  Neurological:     Mental Status: She is alert and oriented to person, place, and time.     No results found for this or any previous visit (from the past 24 hour(s)).  No results found.   ASSESSMENT and PLAN  1. History of lower GI bleeding Resolved. CBC improved/normal.  - POCT CBC - Comprehensive metabolic panel  2. Esophageal dysphagia Referring to different GI practice per patient request - Ambulatory referral to Gastroenterology  3. History of pyloric stenosis - Ambulatory referral to Gastroenterology  4. DOE (dyspnea on exertion) Concerning in patient with CAD, advised see her cardiologist Dr Debara Pickett, ER precautions reviewed. Discussed patient restarting atorvastatin 40mg  (not 60mg ).  Return in about 3 months (around 10/07/2020).    Rutherford Guys, MD Primary Care at [place], [place] [address] Ph.  [phone] Fax  [phone]

## 2020-07-08 LAB — COMPREHENSIVE METABOLIC PANEL
ALT: 12 IU/L (ref 0–32)
AST: 19 IU/L (ref 0–40)
Albumin/Globulin Ratio: 1.5 (ref 1.2–2.2)
Albumin: 4 g/dL (ref 3.8–4.8)
Alkaline Phosphatase: 85 IU/L (ref 48–121)
BUN/Creatinine Ratio: 10 — ABNORMAL LOW (ref 12–28)
BUN: 9 mg/dL (ref 8–27)
Bilirubin Total: <0.2 mg/dL (ref 0.0–1.2)
CO2: 19 mmol/L — ABNORMAL LOW (ref 20–29)
Calcium: 9.4 mg/dL (ref 8.7–10.3)
Chloride: 105 mmol/L (ref 96–106)
Creatinine, Ser: 0.94 mg/dL (ref 0.57–1.00)
GFR calc Af Amer: 71 mL/min/{1.73_m2} (ref >59)
GFR calc non Af Amer: 62 mL/min/{1.73_m2} (ref >59)
Globulin, Total: 2.6 g/dL (ref 1.5–4.5)
Glucose: 99 mg/dL (ref 65–99)
Potassium: 3.5 mmol/L (ref 3.5–5.2)
Sodium: 141 mmol/L (ref 134–144)
Total Protein: 6.6 g/dL (ref 6.0–8.5)

## 2020-09-10 ENCOUNTER — Other Ambulatory Visit: Payer: Self-pay | Admitting: Internal Medicine

## 2020-09-15 DIAGNOSIS — R69 Illness, unspecified: Secondary | ICD-10-CM | POA: Diagnosis not present

## 2020-10-10 ENCOUNTER — Other Ambulatory Visit: Payer: Self-pay | Admitting: Internal Medicine

## 2020-10-24 ENCOUNTER — Telehealth: Payer: Self-pay | Admitting: Internal Medicine

## 2020-10-24 NOTE — Telephone Encounter (Signed)
Pt c/o medication issue:  1. Name of Medication: atorvastatin (LIPITOR) 40 MG tablet  2. How are you currently taking this medication (dosage and times per day)? 1 tablet daily  3. Are you having a reaction (difficulty breathing--STAT)? no  4. What is your medication issue? Malachy Mood from [facility] states the patient is overdue for a refill of the medication. She states she last had it filled 05/13/20. She states she has been trying to reach the patient, but has been unsuccessful. She states she is not sure if the patient is still taking the medication, but would like someone to look into it. She states she does not need a call back

## 2020-10-24 NOTE — Telephone Encounter (Signed)
Spoke with the patient. She stated that she was on the Atorvastatin as prescribed by Dr. Pamella Pert.

## 2020-11-02 ENCOUNTER — Other Ambulatory Visit: Payer: Self-pay | Admitting: Internal Medicine

## 2020-11-10 ENCOUNTER — Telehealth: Payer: Self-pay | Admitting: Internal Medicine

## 2020-11-10 DIAGNOSIS — I10 Essential (primary) hypertension: Secondary | ICD-10-CM

## 2020-11-10 MED ORDER — CARVEDILOL 6.25 MG PO TABS: 6.2500 mg | ORAL_TABLET | Freq: Two times a day (BID) | ORAL | 2 refills | Status: DC
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
## 2020-11-10 NOTE — Telephone Encounter (Signed)
    Pt c/o BP issue: STAT if pt c/o blurred vision, one-sided weakness or slurred speech  1. What are your last 5 BP readings?   11 /14/21 Morning:  203/115 HR 101 Mid day:  189/109 HR 96 Evening: 188/109 HR 90  11/10/20 Morning:  117/104 HR 94 Mid day:   172/112 HR 88  2. Are you having any other symptoms (ex. Dizziness, headache, blurred vision, passed out)? Pt said she can hear her heartbeat in her ear  3. What is your BP issue? Pt said she is not sure what else she can do to lower her BP. It is high but she already takes a high dose of medication

## 2020-11-11 NOTE — Telephone Encounter (Signed)
Thanks Callie - will review with her next week.  -[place]

## 2020-11-18 ENCOUNTER — Other Ambulatory Visit: Payer: Self-pay

## 2020-11-18 ENCOUNTER — Encounter: Payer: Self-pay | Admitting: Internal Medicine

## 2020-11-18 ENCOUNTER — Ambulatory Visit: Payer: Medicare HMO | Admitting: Internal Medicine

## 2020-11-18 VITALS — BP 145/92 | HR 80 | Temp 96.8°F | Ht 61.0 in | Wt 153.6 lb

## 2020-11-18 DIAGNOSIS — F17200 Nicotine dependence, unspecified, uncomplicated: Secondary | ICD-10-CM

## 2020-11-18 DIAGNOSIS — R69 Illness, unspecified: Secondary | ICD-10-CM | POA: Diagnosis not present

## 2020-11-18 DIAGNOSIS — I10 Essential (primary) hypertension: Secondary | ICD-10-CM | POA: Diagnosis not present

## 2020-11-18 DIAGNOSIS — E785 Hyperlipidemia, unspecified: Secondary | ICD-10-CM

## 2020-11-18 MED ORDER — CARVEDILOL 12.5 MG PO TABS: 12.5000 mg | ORAL_TABLET | Freq: Two times a day (BID) | ORAL | 3 refills | Status: DC

## 2020-11-18 NOTE — Patient Instructions (Addendum)
Medication Instructions:  INCREASE carvedilol to 12.5mg  twice daily CONTINUE all other current medications  *If you need a refill on your cardiac medications before your next appointment, please call your pharmacy*  Testing/Procedures: Chest CT @ [facility]   Follow-Up: At [facility], you and your health needs are our priority.  As part of our continuing mission to provide you with exceptional heart care, we have created designated Provider Care Teams.  These Care Teams include your primary Cardiologist (physician) and Advanced Practice Providers (APPs -  Physician Assistants and Nurse Practitioners) who all work together to provide you with the care you need, when you need it.  We recommend signing up for the patient portal called "MyChart".  Sign up information is provided on this After Visit Summary.  MyChart is used to connect with patients for Virtual Visits (Telemedicine).  Patients are able to view lab/test results, encounter notes, upcoming appointments, etc.  Non-urgent messages can be sent to your provider as well.   To learn more about what you can do with MyChart, go to [email or web address].    Your next appointment:   3 month(s)  The format for your next appointment:   In Person  Provider:   You may see Pixie Casino, MD or one of the following Advanced Practice Providers on your designated Care Team:    Almyra Deforest, PA-C  Fabian Sharp, PA-C or   Roby Lofts, [place]    Other Instructions  [place] [place], [place] [address] [address]: [place], MD Pricilla Holm, MD Cathlean Cower, MD Scarlette Calico, MD Lew Dawes, MD  [facility] [address] [place], [place] Phone: [phone] Letta Median, DO Abelino Derrick, MD Wilfred Lacy, AGNP-C

## 2020-11-18 NOTE — Progress Notes (Signed)
OFFICE NOTE  Chief Complaint:  No complaints   Primary Care Physician: Patient, No Pcp Per  HPI:  Meghan Lynn  is a 69 year old female with a history of coronary artery disease status post stenting of the right coronary with 2 Cypher stents and a Taxus stent to the proximal right coronary in 2004. She had a negative stress test in 2007 and otherwise has done pretty well, has been asymptomatic. She has had problems with ongoing tobacco abuse and continues to smoke, however, she says this has been a particularly difficult year. She reported that her husband died this year as well as her sister and brother-in-law. Given these multiple problems she has had a difficult time with grieving and has of course resorted back to smoking. Before she has tried Chantix but has failed that. She is doing some activity, including walking on a treadmill about 3 times a week. Her smoking is less than 1 pack per day, denies any alcohol or street drugs.    She reports since I last saw her that she underwent back surgery and has reported a marked improvement in her back pain. She is continuing to have some difficulty since her husband died and has started smoking again. I have provided her with Wellbutrin however she ran out of that but says that it was very helpful for her.  I saw Meghan Lynn back in the office today. Overall she is doing fairly well. Unfortunate she's had 3 Desyrel with the past year and has battled some depression. She ran out of her Wellbutrin and has started smoking again. She wishes to get back off the cigarettes and restart Wellbutrin. From a cardiac standpoint she denies any chest pain or worsening shortness of breath. She's only taking aspirin once a week due to some stomach upset. She's also concerned about the cost of valsartan and due to being on Medicare.  09/29/2016  Meghan Lynn returns today for follow-up. Recently she's been describing some more shortness of breath,  particularly with exertion. She is not exercising as much but does mow her lawn and do trimming and a number of physical activities for which she has to stop more frequently to catch her breath. She does not report any chest tightness with this but is fatigued reports her legs get weak somewhat and feel like they're going to give out on her. She has continued to smoke unfortunately. She does have a coronary artery disease history with PCI in 2007.  11/16/2016  Meghan Lynn returns for follow-up today. She underwent a nuclear stress test which was negative for ischemia with an EF of 67%. He's had a marked improvement in her blood pressure today 140/88. In general blood pressure runs between 242-683 systolic, unfortunately she's had some cramping in her legs, particularly at night. This is become somewhat intolerable however she did not contact the office. Her renal profile one week after starting the medication showed slight increase in creatinine 0.97 from 0.84 but within normal limits. Potassium was 4.6.  06/22/2017  Meghan Lynn returns today for follow-up. Overall she seems to be doing well although she is reported some leg heaviness. She's had some trouble with this for a while noted recently when trying to push mower lawn she's had some weakness and fatigue in her legs. She seen her neurosurgeon who felt that this was not related to any significant spinal stenosis or other neurologic pathology. She does have a long-standing smoking history. She reports discomfort that starts in around the lower  back and radiates around both hips to the anterior thighs. She says is not burning in quality and is able to walk on a treadmill for a certain period of time before having to stop. She continues to smoke. Blood pressure is well-controlled today.  12/26/2017  Meghan Lynn returns today for follow-up.  She reports her legs have improved somewhat.  She is walking more now is back to work.  She recently has been having some  problems with cramping in her legs.  She cut her blood pressure medication in half which is a combination of irbesartan HCTZ.  She says that has improved her cramping, which suggest to me that she is sensitive to the diuretic.  Unfortunately the blood pressures not at goal today.  Is been about a year since she has had her last lipid profile.  07/18/2018  Meghan Lynn returns today for follow-up.  She reports leg stickiness.  She denied any worsening leg cramps after stopping her thiazide.  Blood pressure was elevated today 154/94 of her repeat cumulative 627 systolic.  She reports recent hair loss and notably that her hair was very fragile.  Is due for repeat lipid profile.  She denies any chest pain.  08/28/2019  Meghan Lynn is seen today in follow-up.  She says she still having some leg discomfort when walking on the treadmill.  This is proximal primarily in both legs and in the hip areas.  It is worse when she is walking up or down inclines.  She does have a history of prior lumbar spine surgery.  She had previous lower extremity arterial Dopplers which showed some atherosclerosis but no significant reduction in ABI.  She continues to smoke and is at risk for PAD.  11/18/2020  Meghan Lynn returns today for follow-up.  Unfortunately she continues to smoke.  She has a 50+ pack year smoking history.  She is not seen her PCP recently, in fact she is in the market for new primary.  She denies any symptoms of claudication but does have some known PAD.  Lower extremity arterial Dopplers were recently performed.  She denies any chest pain.  Recent lipids in February 21 showed total cholesterol 176, HDL 37, LDL 79 and triglycerides at 374.  She is on atorvastatin 40 mg daily.  PMHx:  Past Medical History:  Diagnosis Date  . Active smoker   . Anginal pain ([place])   . Arthritis   . Asthma    patient denies  . Bronchitis due to tobacco use   . Burning sensation of feet   . Coronary artery disease     3 stents to RCA  . Dilatation of esophagus   . Gastritis   . GERD (gastroesophageal reflux disease)   . 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 Headache, migraine    daily  . Hyperlipidemia   . Hypertension   . Peptic ulcer disease   . Pneumonia   . Shortness of breath dyspnea    with exertion    Past Surgical History:  Procedure Laterality Date  . BACK SURGERY    . BALLOON DILATION N/A 05/30/2013   Procedure: BALLOON DILATION;  Surgeon: Missy Sabins, MD;  [facility];  Service: Endoscopy;  Laterality: N/A;  . BALLOON DILATION N/A 08/02/2013   Procedure: BALLOON DILATION;  Surgeon: Missy Sabins, MD;  Location: [place];  Service: Endoscopy;  Laterality: N/A;  . BALLOON DILATION N/A 04/29/2016   Procedure: BALLOON DILATION;  Surgeon: Teena Irani,  MD;  Location: [facility] ENDOSCOPY;  Service: Endoscopy;  Laterality: N/A;  . BALLOON DILATION N/A 06/16/2020   Procedure: BALLOON DILATION;  Surgeon: Ronnette Juniper, MD;  Location: [place];  Service: Gastroenterology;  Laterality: N/A;  . BIOPSY  06/16/2020   Procedure: BIOPSY;  Surgeon: Ronnette Juniper, MD;  Location: [place];  Service: Gastroenterology;;  . CARDIAC CATHETERIZATION  03/07/2002   moderte CAD 80% (mid RCA) - Dr. Myrtice Lauth  . CARDIAC CATHETERIZATION  09/13/2003   Cypher 2.5x64mm and 2.5x18 and Taxus 2.75x37mm to distal, mid, prox RCA (Dr. Gerrie Nordmann)  . CARDIOLITE MYOCARDIAL PERFUSION STUDY  05/2006   positive bruce protocol, low risk, EF 80%  . CARDIOVASCULAR STRESS TEST  2010  . COLONOSCOPY    . COLONOSCOPY WITH PROPOFOL N/A 06/16/2020   Procedure: COLONOSCOPY WITH PROPOFOL;  Surgeon: Ronnette Juniper, MD;  Location: [place];  Service: Gastroenterology;  Laterality: N/A;  . COLONOSCOPY WITH PROPOFOL N/A 06/21/2020   Procedure: COLONOSCOPY WITH PROPOFOL;  Surgeon: Otis Brace, MD;  Location: [place];  Service: Gastroenterology;  Laterality: N/A;  . CORONARY ANGIOPLASTY  2004   stents x 3  . DILATATION &  CURETTAGE/HYSTEROSCOPY WITH MYOSURE N/A 08/19/2016   Procedure: DILATATION & CURETTAGE/HYSTEROSCOPY WITH MYOSURE;  Surgeon: Servando Salina, MD;  Location: [place] [facility];  Service: Gynecology;  Laterality: N/A;  . ESOPHAGOGASTRODUODENOSCOPY N/A 05/30/2013   Procedure: ESOPHAGOGASTRODUODENOSCOPY (EGD);  Surgeon: Missy Sabins, MD;  Location[facility];  Service: Endoscopy;  Laterality: N/A;  . ESOPHAGOGASTRODUODENOSCOPY N/A 08/02/2013   Procedure: ESOPHAGOGASTRODUODENOSCOPY (EGD);  Surgeon: Missy Sabins, MD;  Location: [facility] ENDOSCOPY;  Service: Endoscopy;  Laterality: N/A;  barb Greggory [name]  . ESOPHAGOGASTRODUODENOSCOPY (EGD) WITH PROPOFOL N/A 04/29/2016   Procedure: ESOPHAGOGASTRODUODENOSCOPY (EGD) WITH PROPOFOL;  Surgeon: Teena Irani, MD;  Location: [facility];  Service: Endoscopy;  Laterality: N/A;  . ESOPHAGOGASTRODUODENOSCOPY (EGD) WITH PROPOFOL N/A 06/16/2020   Procedure: ESOPHAGOGASTRODUODENOSCOPY (EGD) WITH PROPOFOL;  Surgeon: Ronnette Juniper, MD;  Location: [place];  Service: Gastroenterology;  Laterality: N/A;  . FRACTURE SURGERY    . HEMOSTASIS CLIP PLACEMENT  06/21/2020   Procedure: HEMOSTASIS CLIP PLACEMENT;  Surgeon: Otis Brace, MD;  Location: [place] ENDOSCOPY;  Service: Gastroenterology;;  . ORIF ELBOW FRACTURE  2006  . POLYPECTOMY  06/16/2020   Procedure: POLYPECTOMY;  Surgeon: Ronnette Juniper, MD;  Location: [place];  Service: Gastroenterology;;  . POLYPECTOMY  06/21/2020   Procedure: POLYPECTOMY;  Surgeon: Otis Brace, MD;  Location: [facility] ENDOSCOPY;  Service: Gastroenterology;;  . TRANSTHORACIC ECHOCARDIOGRAM  07/2008   normal LV function, mild MR, mild TR, trace pulm valve regurg    FAMHx:  Family History  Problem Relation Age of Onset  . Diabetes Mother   . CAD Mother        CABG  . Bone cancer Father   . Hypertension Father   . Heart disease Brother   . Kidney disease Sister   . Cancer Sister   . Diabetes Sister   . Hyperlipidemia Sister   . Hypertension Sister     SOCHx:    reports that she has been smoking cigarettes. She has a 27.00 pack-year smoking history. She has never used smokeless tobacco. She reports current alcohol use. She reports that she does not use drugs.  ALLERGIES:  Allergies  Allergen Reactions  . Propoxyphene Nausea And Vomiting    Darvocet  . Latex Itching    ROS: Pertinent items noted in HPI and remainder of comprehensive ROS otherwise negative.  HOME MEDS: Current Outpatient Medications  Medication Sig  Dispense Refill  . atorvastatin (LIPITOR) 40 MG tablet Take 1 tablet (40 mg total) by mouth daily. 90 tablet 3  . Biotin 1 MG CAPS Take 1 mg by mouth daily.     . carvedilol (COREG) 6.25 MG tablet Take 1 tablet (6.25 mg total) by mouth 2 (two) times daily. 60 tablet 2  . Multiple Vitamin (MULTIVITAMIN WITH MINERALS) TABS Take 1 tablet by mouth daily.    [name] Kitchen omeprazole (PRILOSEC) 20 MG capsule Take 20 mg by mouth daily.    . valsartan (DIOVAN) 320 MG tablet TAKE 1 TABLET DAILY. PLEASE CONTACT OFFICE TO SCHEDULE FOLLOW UP APPOINTMENT PRIOR TO ANY REFILLS 30 tablet 0   No current facility-administered medications for this visit.    LABS/IMAGING: No results found for this or any previous visit (from the past 48 hour(s)). No results found.  VITALS: BP (!) 145/92   Pulse 80   Temp (!) 96.8 F (36 C)   Ht 5\' 1"  (1.549 m)   Wt 153 lb 9.6 oz (69.7 kg)   SpO2 96%   BMI 29.02 kg/m   EXAM: General appearance: alert and no distress Neck: no carotid bruit and no JVD Lungs: clear to auscultation bilaterally Heart: regular rate and rhythm, S1, S2 normal, no murmur, click, rub or gallop Abdomen: soft, non-tender; bowel sounds normal; no masses,  no organomegaly Extremities: extremities normal, atraumatic, no cyanosis or edema Pulses: 1+ DP/PT pulses bilaterally Skin: Skin color, texture, turgor normal. No rashes or lesions Neurologic: Grossly normal Psych: Pleasant  EKG: Normal sinus rhythm at 80, ST and T wave changes  laterally-personally reviewed  ASSESSMENT: 1. Bilateral leg pain with walking - PAD with no significant obstruction 2. CAD status post PCI x3 to the right coronary artery 2007 3. Hypertension 4. Dyslipidemia 5. Tobacco abuse 6. GERD 7. Weight gain, hair loss  PLAN:  1.   Meghan Lynn continues to smoke and does have some PAD without significant obstruction.  Blood pressure is still little higher than ideal.  Her cholesterol is also not at goal with very high triglycerides.  She continues to smoke and has not had any screening evaluation to rule out a lung cancer evaluation for possible COPD.  I would recommend a low dose chest CT to evaluate this.  She also need to establish with a PCP and if there is any evidence of emphysema, would refer her to pulmonary.  Have a course discussed smoking cessation with her however she is not interested at this time.  We will need to monitor blood pressure closely at home which she says is generally much lower.  Finally her cholesterol is very high including triglycerides.  May need to consider additional therapies given her history of coronary disease.  She would be a candidate for Vascepa however cost may be an issue.  As her blood pressure is elevated, would recommend an increase in her carvedilol to 12.5 mg twice daily.  We may need to consider adding amlodipine as well at follow-up.  Follow-up with me in 3 months.  Pixie Casino, MD, [facility], [place] Director of the Advanced Lipid Disorders &  Cardiovascular Risk Reduction Clinic Attending Cardiologist  Direct Dial: [phone]  Fax: [phone]  Website:  [email or web address] 11/18/2020, 9:38 AM

## 2020-11-26 NOTE — Telephone Encounter (Signed)
Left message for patient to call to discuss scheduling the chest ct ordered by Dr. Debara Pickett

## 2020-11-27 NOTE — Telephone Encounter (Signed)
Spoke with patient regarding appointment for CT chest (lung cancer screening) scheduled 12/18/20 at 9:00 am---arrival time is 8:45 am at [facility] for check in---will mail information to patient and she voiced her understanding.

## 2020-12-01 ENCOUNTER — Other Ambulatory Visit: Payer: Self-pay | Admitting: Internal Medicine

## 2020-12-11 ENCOUNTER — Other Ambulatory Visit: Payer: Self-pay | Admitting: Internal Medicine

## 2020-12-18 ENCOUNTER — Other Ambulatory Visit: Payer: Self-pay

## 2020-12-18 ENCOUNTER — Ambulatory Visit
Admission: RE | Admit: 2020-12-18 | Discharge: 2020-12-18 | Disposition: A | Discharge status: Home / Self Care | Payer: Medicare HMO | Source: Ambulatory Visit | Attending: Internal Medicine | Admitting: Internal Medicine

## 2020-12-18 DIAGNOSIS — Z87891 Personal history of nicotine dependence: Secondary | ICD-10-CM | POA: Diagnosis not present

## 2020-12-18 DIAGNOSIS — I2510 Atherosclerotic heart disease of native coronary artery without angina pectoris: Secondary | ICD-10-CM | POA: Diagnosis not present

## 2020-12-18 DIAGNOSIS — F17200 Nicotine dependence, unspecified, uncomplicated: Secondary | ICD-10-CM

## 2020-12-18 DIAGNOSIS — J432 Centrilobular emphysema: Secondary | ICD-10-CM | POA: Diagnosis not present

## 2020-12-18 DIAGNOSIS — I700 Atherosclerosis of aorta: Secondary | ICD-10-CM | POA: Diagnosis not present

## 2020-12-24 ENCOUNTER — Telehealth: Payer: Self-pay | Admitting: Internal Medicine

## 2020-12-24 DIAGNOSIS — R918 Other nonspecific abnormal finding of lung field: Secondary | ICD-10-CM

## 2020-12-24 NOTE — Telephone Encounter (Signed)
Spoke with patient about chest CT results. She voiced understanding. Repeat chest CT ordered

## 2020-12-24 NOTE — Telephone Encounter (Signed)
Patient is returning call to go over CT results. She states she was at work yesterday when reviewing the results with the nurse and she was unable to discuss further during that time. Please call.

## 2020-12-24 NOTE — Telephone Encounter (Signed)
Sandi Mariscal, RN  12/23/2020 10:55 AM EST      Patient made aware of results and verbalized understanding.   Chrystie Nose, MD  12/18/2020 7:51 PM EST      Tiny nodules thought to be benign. Emphysema and aortic atherosclerosis. Repeat screening low dose chest CT without contrast in 1 year.  Dr Rexene Edison

## 2021-02-01 ENCOUNTER — Other Ambulatory Visit: Payer: Self-pay | Admitting: Student

## 2021-02-01 DIAGNOSIS — I10 Essential (primary) hypertension: Secondary | ICD-10-CM

## 2021-02-19 ENCOUNTER — Other Ambulatory Visit: Payer: Self-pay

## 2021-02-19 ENCOUNTER — Ambulatory Visit: Payer: Medicare HMO | Admitting: Internal Medicine

## 2021-02-19 VITALS — BP 152/90 | HR 77 | Ht 61.0 in | Wt 150.0 lb

## 2021-02-19 DIAGNOSIS — F17200 Nicotine dependence, unspecified, uncomplicated: Secondary | ICD-10-CM

## 2021-02-19 DIAGNOSIS — I10 Essential (primary) hypertension: Secondary | ICD-10-CM | POA: Diagnosis not present

## 2021-02-19 DIAGNOSIS — I739 Peripheral vascular disease, unspecified: Secondary | ICD-10-CM | POA: Diagnosis not present

## 2021-02-19 DIAGNOSIS — R918 Other nonspecific abnormal finding of lung field: Secondary | ICD-10-CM | POA: Diagnosis not present

## 2021-02-19 DIAGNOSIS — J439 Emphysema, unspecified: Secondary | ICD-10-CM | POA: Diagnosis not present

## 2021-02-19 DIAGNOSIS — E785 Hyperlipidemia, unspecified: Secondary | ICD-10-CM

## 2021-02-19 DIAGNOSIS — R69 Illness, unspecified: Secondary | ICD-10-CM | POA: Diagnosis not present

## 2021-02-19 MED ORDER — AMLODIPINE BESYLATE 5 MG PO TABS: 5.0000 mg | ORAL_TABLET | Freq: Every day | ORAL | 3 refills | Status: DC

## 2021-02-19 NOTE — Patient Instructions (Signed)
Medication Instructions:  START amlodipine 5mg  daily for blood pressure Continue all other current medications  *If you need a refill on your cardiac medications before your next appointment, please call your pharmacy*   Lab Work: CMET, Lipid Panel   If you have labs (blood work) drawn today and your tests are completely normal, you will receive your results only by: [name] Kitchen MyChart Message (if you have MyChart) OR . A paper copy in the mail If you have any lab test that is abnormal or we need to change your treatment, we will call you to review the results.   Testing/Procedures: Lower Extremity Arterial Doppler @ Dr. Lysbeth Penner office  Chest CT due in Dec 2022   Follow-Up: At [facility], you and your health needs are our priority.  As part of our continuing mission to provide you with exceptional heart care, we have created designated Provider Care Teams.  These Care Teams include your primary Cardiologist (physician) and Advanced Practice Providers (APPs -  Physician Assistants and Nurse Practitioners) who all work together to provide you with the care you need, when you need it.  We recommend signing up for the patient portal called "MyChart".  Sign up information is provided on this After Visit Summary.  MyChart is used to connect with patients for Virtual Visits (Telemedicine).  Patients are able to view lab/test results, encounter notes, upcoming appointments, etc.  Non-urgent messages can be sent to your provider as well.   To learn more about what you can do with MyChart, go to [email or web address].    Your next appointment:   3 month(s)  The format for your next appointment:   In Person  Provider:   You may see Pixie Casino, MD or one of the following Advanced Practice Providers on your designated Care Team:    Almyra Deforest, PA-C  Fabian Sharp, PA-C or   Roby Lofts, [place]    Other Instructions You have been referred to Ernest Mallick, DO, MPH (pulmonary) [place], [place] [address] [phone]): [place] @ [facility] [place], [place] Phone: [phone]  Providers Letta Median, DO Caroline Sauger, MSW, [place], [facility] Abelino Derrick, MD Wilfred Lacy, AGNP-C

## 2021-02-19 NOTE — Progress Notes (Signed)
OFFICE NOTE  Chief Complaint:  Follow-up   Primary Care Physician: Patient, No Pcp Per  HPI:  Meghan Lynn  is a 70 year old female with a history of coronary artery disease status post stenting of the right coronary with 2 Cypher stents and a Taxus stent to the proximal right coronary in 2004. She had a negative stress test in 2007 and otherwise has done pretty well, has been asymptomatic. She has had problems with ongoing tobacco abuse and continues to smoke, however, she says this has been a particularly difficult year. She reported that her husband died this year as well as her sister and brother-in-law. Given these multiple problems she has had a difficult time with grieving and has of course resorted back to smoking. Before she has tried Chantix but has failed that. She is doing some activity, including walking on a treadmill about 3 times a week. Her smoking is less than 1 pack per day, denies any alcohol or street drugs.    She reports since I last saw her that she underwent back surgery and has reported a marked improvement in her back pain. She is continuing to have some difficulty since her husband died and has started smoking again. I have provided her with Wellbutrin however she ran out of that but says that it was very helpful for her.  I saw Meghan Lynn back in the office today. Overall she is doing fairly well. Unfortunate she's had 3 Desyrel with the past year and has battled some depression. She ran out of her Wellbutrin and has started smoking again. She wishes to get back off the cigarettes and restart Wellbutrin. From a cardiac standpoint she denies any chest pain or worsening shortness of breath. She's only taking aspirin once a week due to some stomach upset. She's also concerned about the cost of valsartan and due to being on Medicare.  09/29/2016  Meghan Lynn returns today for follow-up. Recently she's been describing some more shortness of breath, particularly  with exertion. She is not exercising as much but does mow her lawn and do trimming and a number of physical activities for which she has to stop more frequently to catch her breath. She does not report any chest tightness with this but is fatigued reports her legs get weak somewhat and feel like they're going to give out on her. She has continued to smoke unfortunately. She does have a coronary artery disease history with PCI in 2007.  11/16/2016  Meghan Lynn returns for follow-up today. She underwent a nuclear stress test which was negative for ischemia with an EF of 67%. He's had a marked improvement in her blood pressure today 140/88. In general blood pressure runs between 833-825 systolic, unfortunately she's had some cramping in her legs, particularly at night. This is become somewhat intolerable however she did not contact the office. Her renal profile one week after starting the medication showed slight increase in creatinine 0.97 from 0.84 but within normal limits. Potassium was 4.6.  06/22/2017  Meghan Lynn returns today for follow-up. Overall she seems to be doing well although she is reported some leg heaviness. She's had some trouble with this for a while noted recently when trying to push mower lawn she's had some weakness and fatigue in her legs. She seen her neurosurgeon who felt that this was not related to any significant spinal stenosis or other neurologic pathology. She does have a long-standing smoking history. She reports discomfort that starts in around the lower back  and radiates around both hips to the anterior thighs. She says is not burning in quality and is able to walk on a treadmill for a certain period of time before having to stop. She continues to smoke. Blood pressure is well-controlled today.  12/26/2017  Meghan Lynn returns today for follow-up.  She reports her legs have improved somewhat.  She is walking more now is back to work.  She recently has been having some problems with  cramping in her legs.  She cut her blood pressure medication in half which is a combination of irbesartan HCTZ.  She says that has improved her cramping, which suggest to me that she is sensitive to the diuretic.  Unfortunately the blood pressures not at goal today.  Is been about a year since she has had her last lipid profile.  07/18/2018  Meghan Lynn returns today for follow-up.  She reports leg stickiness.  She denied any worsening leg cramps after stopping her thiazide.  Blood pressure was elevated today 154/94 of her repeat cumulative 818 systolic.  She reports recent hair loss and notably that her hair was very fragile.  Is due for repeat lipid profile.  She denies any chest pain.  08/28/2019  Meghan Lynn is seen today in follow-up.  She says she still having some leg discomfort when walking on the treadmill.  This is proximal primarily in both legs and in the hip areas.  It is worse when she is walking up or down inclines.  She does have a history of prior lumbar spine surgery.  She had previous lower extremity arterial Dopplers which showed some atherosclerosis but no significant reduction in ABI.  She continues to smoke and is at risk for PAD.  11/18/2020  Meghan Lynn returns today for follow-up.  Unfortunately she continues to smoke.  She has a 50+ pack year smoking history.  She is not seen her PCP recently, in fact she is in the market for new primary.  She denies any symptoms of claudication but does have some known PAD.  Lower extremity arterial Dopplers were recently performed.  She denies any chest pain.  Recent lipids in February 21 showed total cholesterol 176, HDL 37, LDL 79 and triglycerides at 374.  She is on atorvastatin 40 mg daily.  02/19/2021  Meghan Lynn returns today for follow-up.  She has not stop smoking.  She had a CT lung cancer screening study which showed lung RADS 2 benign tiny pulmonary nodules up to 3.8 mm.  Repeat study was recommended in 1 year.  Emphysema  and aortic atherosclerosis was noted.  She is due for repeat lab work.  Her PCP had stopped practicing and therefore she has no primary.  She does report shortness of breath with exertion.  She also starts to complain about some symptoms concerning for claudication.  She has known PAD without obstruction by lower extremity arterial Dopplers in 2020.  Blood pressure remains elevated.  PMHx:  Past Medical History:  Diagnosis Date  . Active smoker   . Anginal pain ([place])   . Arthritis   . Asthma    patient denies  . Bronchitis due to tobacco use   . Burning sensation of feet   . Coronary artery disease    3 stents to RCA  . Dilatation of esophagus   . Gastritis   . GERD (gastroesophageal reflux disease)   . 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 Headache, migraine    daily  . Hyperlipidemia   .  Hypertension   . Peptic ulcer disease   . Pneumonia   . Shortness of breath dyspnea    with exertion    Past Surgical History:  Procedure Laterality Date  . BACK SURGERY    . BALLOON DILATION N/A 05/30/2013   Procedure: BALLOON DILATION;  Surgeon: Missy Sabins, MD;  Location: [facility] ENDOSCOPY;  Service: Endoscopy;  Laterality: N/A;  . BALLOON DILATION N/A 08/02/2013   Procedure: BALLOON DILATION;  Surgeon: Missy Sabins, MD;  Location: [place];  Service: Endoscopy;  Laterality: N/A;  . BALLOON DILATION N/A 04/29/2016   Procedure: BALLOON DILATION;  Surgeon: Teena Irani, MD;  Location: [facility] ENDOSCOPY;  Service: Endoscopy;  Laterality: N/A;  . BALLOON DILATION N/A 06/16/2020   Procedure: BALLOON DILATION;  Surgeon: Ronnette Juniper, MD;  Location: [place];  Service: Gastroenterology;  Laterality: N/A;  . BIOPSY  06/16/2020   Procedure: BIOPSY;  Surgeon: Ronnette Juniper, MD;  Location: [place];  Service: Gastroenterology;;  . CARDIAC CATHETERIZATION  03/07/2002   moderte CAD 80% (mid RCA) - Dr. Myrtice Lauth  . CARDIAC CATHETERIZATION  09/13/2003   Cypher 2.5x86mm and 2.5x18 and Taxus 2.75x62mm to  distal, mid, prox RCA (Dr. Gerrie Nordmann)  . CARDIOLITE MYOCARDIAL PERFUSION STUDY  05/2006   positive bruce protocol, low risk, EF 80%  . CARDIOVASCULAR STRESS TEST  2010  . COLONOSCOPY    . COLONOSCOPY WITH PROPOFOL N/A 06/16/2020   Procedure: COLONOSCOPY WITH PROPOFOL;  Surgeon: Ronnette Juniper, MD;  Location: [place];  Service: Gastroenterology;  Laterality: N/A;  . COLONOSCOPY WITH PROPOFOL N/A 06/21/2020   Procedure: COLONOSCOPY WITH PROPOFOL;  Surgeon: Otis Brace, MD;  Location: [facility];  Service: Gastroenterology;  Laterality: N/A;  . CORONARY ANGIOPLASTY  2004   stents x 3  . DILATATION & CURETTAGE/HYSTEROSCOPY WITH MYOSURE N/A 08/19/2016   Procedure: DILATATION & CURETTAGE/HYSTEROSCOPY WITH MYOSURE;  Surgeon: Servando Salina, MD;  Location: [facility];  Service: Gynecology;  Laterality: N/A;  . ESOPHAGOGASTRODUODENOSCOPY N/A 05/30/2013   Procedure: ESOPHAGOGASTRODUODENOSCOPY (EGD);  Surgeon: Missy Sabins, MD;  Location: [facility] ENDOSCOPY;  Service: Endoscopy;  Laterality: N/A;  . ESOPHAGOGASTRODUODENOSCOPY N/A 08/02/2013   Procedure: ESOPHAGOGASTRODUODENOSCOPY (EGD);  Surgeon: Missy Sabins, MD;  Location: [facility] ENDOSCOPY;  Service: Endoscopy;  Laterality: N/A;  barb Greggory [name]  . ESOPHAGOGASTRODUODENOSCOPY (EGD) WITH PROPOFOL N/A 04/29/2016   Procedure: ESOPHAGOGASTRODUODENOSCOPY (EGD) WITH PROPOFOL;  Surgeon: Teena Irani, MD;  Location: [facility];  Service: Endoscopy;  Laterality: N/A;  . ESOPHAGOGASTRODUODENOSCOPY (EGD) WITH PROPOFOL N/A 06/16/2020   Procedure: ESOPHAGOGASTRODUODENOSCOPY (EGD) WITH PROPOFOL;  Surgeon: Ronnette Juniper, MD;  Location: [place];  Service: Gastroenterology;  Laterality: N/A;  . FRACTURE SURGERY    . HEMOSTASIS CLIP PLACEMENT  06/21/2020   Procedure: HEMOSTASIS CLIP PLACEMENT;  Surgeon: Otis Brace, MD;  Location: [facility];  Service: Gastroenterology;;  . ORIF ELBOW FRACTURE  2006  . POLYPECTOMY  06/16/2020   Procedure: POLYPECTOMY;  Surgeon: Ronnette Juniper,  MD;  Location: [place];  Service: Gastroenterology;;  . POLYPECTOMY  06/21/2020   Procedure: POLYPECTOMY;  Surgeon: Otis Brace, MD;  Location: [facility] ENDOSCOPY;  Service: Gastroenterology;;  . TRANSTHORACIC ECHOCARDIOGRAM  07/2008   normal LV function, mild MR, mild TR, trace pulm valve regurg    FAMHx:  Family History  Problem Relation Age of Onset  . Diabetes Mother   . CAD Mother        CABG  . Bone cancer Father   . Hypertension Father   . Heart disease Brother   . Kidney  disease Sister   . Cancer Sister   . Diabetes Sister   . Hyperlipidemia Sister   . Hypertension Sister     SOCHx:   reports that she has been smoking cigarettes. She has a 27.00 pack-year smoking history. She has never used smokeless tobacco. She reports current alcohol use. She reports that she does not use drugs.  ALLERGIES:  Allergies  Allergen Reactions  . Propoxyphene Nausea And Vomiting    Darvocet  . Latex Itching    ROS: Pertinent items noted in HPI and remainder of comprehensive ROS otherwise negative.  HOME MEDS: Current Outpatient Medications  Medication Sig Dispense Refill  . atorvastatin (LIPITOR) 40 MG tablet Take 1 tablet (40 mg total) by mouth daily. 90 tablet 3  . Biotin 1 MG CAPS Take 1 mg by mouth daily.     . carvedilol (COREG) 12.5 MG tablet Take 1 tablet (12.5 mg total) by mouth 2 (two) times daily. 180 tablet 3  . Multiple Vitamin (MULTIVITAMIN WITH MINERALS) TABS Take 1 tablet by mouth daily.    [name] Kitchen omeprazole (PRILOSEC) 20 MG capsule Take 20 mg by mouth daily.    . valsartan (DIOVAN) 320 MG tablet Take 1 tablet (320 mg total) by mouth daily. 90 tablet 3   No current facility-administered medications for this visit.    LABS/IMAGING: No results found for this or any previous visit (from the past 48 hour(s)). No results found.  VITALS: BP (!) 152/90   Pulse 77   Ht 5\' 1"  (1.549 m)   Wt 150 lb (68 kg)   SpO2 97%   BMI 28.34 kg/m   EXAM: General appearance:  alert and no distress Neck: no carotid bruit and no JVD Lungs: clear to auscultation bilaterally Heart: regular rate and rhythm, S1, S2 normal, no murmur, click, rub or gallop Abdomen: soft, non-tender; bowel sounds normal; no masses,  no organomegaly Extremities: extremities normal, atraumatic, no cyanosis or edema Pulses: 1+ DP/PT pulses bilaterally Skin: Skin color, texture, turgor normal. No rashes or lesions Neurologic: Grossly normal Psych: Pleasant  EKG: Normal sinus rhythm 77, possible left atrial enlargement, ST and T wave abnormalities inferolaterally-personally reviewed  ASSESSMENT: 1. Bilateral leg pain with walking - PAD with no significant obstruction 2. CAD status post PCI x3 to the right coronary artery 2007 3. Hypertension 4. Dyslipidemia 5. Tobacco abuse 6. GERD 7. Weight gain, hair loss 8. Emphysema  PLAN:  1.   Mrs. Negrete has not been able to stop smoking.  She had success with Chantix in the past but took it before only for a few days and her blood pressure went out.  She does not want to "waste the money" again.  Blood pressure is elevated.  Recommend starting amlodipine 5 mg daily in addition to her current medicines.  She is complaining of symptoms concerning for PAD.  She had nonobstructive disease by lower extremity arterial Dopplers in 2020.  I like to repeat those.  In addition her CT showed very tiny nodules with a repeat CT scheduled in 1 year.  Also there is emphysema.  I would refer her to pulmonary for this.  She does not have a primary care provider.  We will provide her with some names for that as well.  Repeat lipids and a metabolic profile today.  We may need to further adjust her cholesterol medications.    Follow-up with me in 3 months.  Pixie Casino, MD, [facility], [place]  Director of the [facility] Attending Cardiologist  Direct Dial: [phone]   Fax: [phone]  Website:  [email or web address] 02/19/2021, 8:23 AM

## 2021-03-06 ENCOUNTER — Other Ambulatory Visit: Payer: Self-pay | Admitting: Internal Medicine

## 2021-03-06 DIAGNOSIS — I739 Peripheral vascular disease, unspecified: Secondary | ICD-10-CM

## 2021-03-09 ENCOUNTER — Other Ambulatory Visit: Payer: Self-pay

## 2021-03-09 ENCOUNTER — Ambulatory Visit (HOSPITAL_COMMUNITY)
Admission: RE | Admit: 2021-03-09 | Discharge: 2021-03-09 | Disposition: A | Discharge status: Home / Self Care | Payer: Medicare HMO | Source: Ambulatory Visit | Attending: Internal Medicine | Admitting: Internal Medicine

## 2021-03-09 DIAGNOSIS — I739 Peripheral vascular disease, unspecified: Secondary | ICD-10-CM | POA: Diagnosis not present

## 2021-03-12 DIAGNOSIS — I951 Orthostatic hypotension: Secondary | ICD-10-CM | POA: Diagnosis not present

## 2021-03-12 DIAGNOSIS — Z6828 Body mass index (BMI) 28.0-28.9, adult: Secondary | ICD-10-CM | POA: Diagnosis not present

## 2021-03-12 DIAGNOSIS — I2510 Atherosclerotic heart disease of native coronary artery without angina pectoris: Secondary | ICD-10-CM | POA: Diagnosis not present

## 2021-03-12 DIAGNOSIS — E663 Overweight: Secondary | ICD-10-CM | POA: Diagnosis not present

## 2021-03-12 DIAGNOSIS — E785 Hyperlipidemia, unspecified: Secondary | ICD-10-CM | POA: Diagnosis not present

## 2021-03-12 DIAGNOSIS — I10 Essential (primary) hypertension: Secondary | ICD-10-CM | POA: Diagnosis not present

## 2021-03-12 DIAGNOSIS — K219 Gastro-esophageal reflux disease without esophagitis: Secondary | ICD-10-CM | POA: Diagnosis not present

## 2021-03-12 DIAGNOSIS — R69 Illness, unspecified: Secondary | ICD-10-CM | POA: Diagnosis not present

## 2021-03-12 DIAGNOSIS — G8929 Other chronic pain: Secondary | ICD-10-CM | POA: Diagnosis not present

## 2021-03-12 DIAGNOSIS — M79601 Pain in right arm: Secondary | ICD-10-CM | POA: Diagnosis not present

## 2021-03-19 ENCOUNTER — Ambulatory Visit: Payer: Medicare HMO | Admitting: Pulmonary Disease

## 2021-03-19 ENCOUNTER — Other Ambulatory Visit: Payer: Self-pay

## 2021-03-19 ENCOUNTER — Encounter: Payer: Self-pay | Admitting: Pulmonary Disease

## 2021-03-19 VITALS — BP 124/74 | HR 95 | Temp 97.4°F | Ht 61.0 in | Wt 146.6 lb

## 2021-03-19 DIAGNOSIS — J439 Emphysema, unspecified: Secondary | ICD-10-CM | POA: Diagnosis not present

## 2021-03-19 DIAGNOSIS — R0600 Dyspnea, unspecified: Secondary | ICD-10-CM

## 2021-03-19 DIAGNOSIS — R0609 Other forms of dyspnea: Secondary | ICD-10-CM

## 2021-03-19 MED ORDER — ANORO ELLIPTA 62.5-25 MCG/INH IN AEPB: 1.0000 | INHALATION_SPRAY | Freq: Every day | RESPIRATORY_TRACT | 0 refills | Status: DC

## 2021-03-19 MED ORDER — ANORO ELLIPTA 62.5-25 MCG/INH IN AEPB: 1.0000 | INHALATION_SPRAY | Freq: Every day | RESPIRATORY_TRACT | 5 refills | Status: DC

## 2021-03-19 NOTE — Progress Notes (Signed)
@Patient  ID: Meghan Lynn, female    DOB: 06/30/1951, 70 y.o.   MRN: [id number]  Chief Complaint  Patient presents with  . Consult    Referred by cardiologist for emphysema. States she has only has SOB while cutting the grass.     Referring provider: Pixie Casino, MD  HPI:   70 year old whom we are seeing in consultation for evaluation of dyspnea on exertion, lung cancer screening.  Note from cardiologist, referring provider reviewed.  Overall, patient feels well.  She is not sure why she is here.  Discussed finding of emphysema on CT scan as well as small tiny nodules.  She endorsed yes this is likely the reason she is here.  Her chief reason for seeing me is dyspnea exertion.  Dyspnea exertion described as mild.  Occurs with extreme exertion such as yard work.  She rest for a few minutes and goes back and complete her task.  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  Otherwise, on day-to-day basis, she denies any dyspnea or respiratory symptoms.  No timing during day when things are better or worse.  No environmental or seasonal changes.  No other alleviating or exacerbating factors.  Discussed finding of recent lung cancer CT screening 11/2020 which on my review reveals scattered small pulmonary nodules as well as mild emphysematous changes primarily at the apices.  Discussed at length the rationale for lung cancer screening.  She states she does not want any more screening.  No more CT scans.  Discussed following the small nodules.  She understands.  We can discuss at a later date.  However, she is on a fixed income, does not see benefit to doing these tests and it causes quite a financial burden which is understandable.  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  MMRC: mMRC Dyspnea Scale mMRC Score  03/19/2021 1    Epworth:  No flowsheet data found.  Tests:   FENO:  No results found for: NITRICOXIDE  PFT: No flowsheet data found.  WALK:  No flowsheet data found.  Imaging: Personally reviewed and as per EMR and discussion in this note VAS [place] ABI WITH/WO TBI  Result Date: 03/10/2021 LOWER EXTREMITY DOPPLER STUDY Indications: Peripheral artery disease, and the patient complains of stable              bilateral leg pain and weakness after using the treadmill about 15              to 20 minutes. She states she can sometimes push it to the 30              minutes or more before she would have to stop and rest due to              heaviness and weakness in both legs. She reports she has been              dealing with this for several years along with the same symptoms  when doing yard work. She is unable to report any significant              change with her symptoms regarding yard work since she has not been              doing anything with it right now; however, she denies any worsening              symptoms since prior exam. High Risk Factors: Hypertension, hyperlipidemia, current smoker, coronary artery                    disease.  Comparison Study: In 08/2019, a lower arterial Doppler showed an ABI of 1.01 on                   the right and 1.05 on the left. Performing Technologist: Sharlett Iles RVT  Examination Guidelines: A complete evaluation includes at minimum, Doppler waveform signals and systolic blood pressure reading at the level of bilateral brachial, anterior tibial, and posterior tibial arteries, when vessel segments are accessible. Bilateral  testing is considered an integral part of a complete examination. Photoelectric Plethysmograph (PPG) waveforms and toe systolic pressure readings are included as required and additional duplex testing as needed. Limited examinations for reoccurring indications may be performed as noted.  ABI Findings: +---------+------------------+-----+---------+---------------------------------+ Right    Rt Pressure (mmHg)IndexWaveform Comment                           +---------+------------------+-----+---------+---------------------------------+ Brachial                                 Patient refused applying pressure                                          to arm due to rods placed in them +---------+------------------+-----+---------+---------------------------------+ ATA      168                    triphasic                                  +---------+------------------+-----+---------+---------------------------------+ PTA      155                    triphasic                                  +---------+------------------+-----+---------+---------------------------------+ PERO     165                    triphasic                                  +---------+------------------+-----+---------+---------------------------------+ [place] Primus                    Normal                                     +---------+------------------+-----+---------+---------------------------------+ +---------+------------------+-----+---------+-------+ Left     Lt  Pressure (mmHg)IndexWaveform Comment +---------+------------------+-----+---------+-------+ ATA      158                    triphasic        +---------+------------------+-----+---------+-------+ PTA      153                    triphasic        +---------+------------------+-----+---------+-------+ PERO     154                    triphasic        +---------+------------------+-----+---------+-------+ Drucilla Chalet                     Normal           +---------+------------------+-----+---------+-------+ +-------+-----------+-----------+------------+------------+ ABI/TBIToday's ABIToday's TBIPrevious ABIPrevious TBI +-------+-----------+-----------+------------+------------+ Right  1.12       1.13                                +-------+-----------+-----------+------------+------------+ Left   1.05       1.06                                +-------+-----------+-----------+------------+------------+  Bilateral ABIs and TBIs appear essentially unchanged compared to prior study on 09/06/2019.  Summary: Right: Resting right ankle-brachial index is within normal range. No evidence of significant right lower extremity arterial disease. The right toe-brachial index is normal. Left: Resting left ankle-brachial index is within normal range. No evidence of significant left lower extremity arterial disease. The left toe-brachial index is normal.  *See table(s) above for measurements and observations.  Electronically signed by Quay Burow MD on 03/10/2021 at 11:12:43 AM.    Final     Lab Results: Personally reviewed, no significant elevation of eosinophils, no anemia CBC    Component Value Date/Time   WBC 10.2 07/07/2020 1644   WBC 12.7 (H) 06/22/2020 0328   RBC 5.09 07/07/2020 1644   RBC 4.39 06/22/2020 0328   HGB 13.5 07/07/2020 1644   HGB 11.5 (L) 06/22/2020 0328   HCT 41.9 (A) 07/07/2020 1644   HCT 35.6 (L) 06/22/2020 0328   PLT 213 06/22/2020 0328   MCV 82.4 07/07/2020 1644   MCH 26.5 (A) 07/07/2020 1644   MCH 26.2 06/22/2020 0328   MCHC 32.2 07/07/2020 1644   MCHC 32.3 06/22/2020 0328   RDW 15.1 06/22/2020 0328   LYMPHSABS 6.6 (H) 06/20/2020 1824   MONOABS 0.2 06/20/2020 1824   EOSABS 0.0 06/20/2020 1824   BASOSABS 0.3 (H) 06/20/2020 1824    BMET    Component Value Date/Time   NA 141 07/07/2020 1647   K 3.5 07/07/2020 1647   CL 105 07/07/2020 1647   CO2 19 (L) 07/07/2020 1647    GLUCOSE 99 07/07/2020 1647   GLUCOSE 110 (H) 06/22/2020 0328   BUN 9 07/07/2020 1647   CREATININE 0.94 07/07/2020 1647   CREATININE 0.94 01/03/2017 1228   CALCIUM 9.4 07/07/2020 1647   GFRNONAA 62 07/07/2020 1647   GFRAA 71 07/07/2020 1647    BNP No results found for: BNP  ProBNP No results found for: PROBNP  Specialty Problems      Pulmonary Problems   Shortness of breath   Asthma    patient denies      Bronchitis due to tobacco use  Pneumonia      Allergies  Allergen Reactions  . Propoxyphene Nausea And Vomiting    Darvocet  . Latex Itching    Immunization History  Administered Date(s) Administered  . Fluad Quad(high Dose 65+) 12/26/2019  . Influenza, High Dose Seasonal PF 09/26/2018  . Moderna Sars-Covid-2 Vaccination 02/02/2020, 02/25/2020, 01/07/2021    Past Medical History:  Diagnosis Date  . Active smoker   . Anginal pain ([place])   . Arthritis   . Asthma    patient denies  . Bronchitis due to tobacco use   . Burning sensation of feet   . Coronary artery disease    3 stents to RCA  . Dilatation of esophagus   . Gastritis   . GERD (gastroesophageal reflux disease)   . 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 Headache, migraine    daily  . Hyperlipidemia   . Hypertension   . Peptic ulcer disease   . Pneumonia   . Shortness of breath dyspnea    with exertion    Tobacco History: Social History   Tobacco Use  Smoking Status Current Every Day Smoker  . Packs/day: 0.60  . Years: 45.00  . Pack years: 27.00  . Types: Cigarettes  Smokeless Tobacco Never Used   Ready to quit: Not Answered Counseling given: Not Answered   Continue to not smoke  Outpatient Encounter Medications as of 03/19/2021  Medication Sig  . amLODipine (NORVASC) 5 MG tablet Take 1 tablet (5 mg total) by mouth daily.  . Biotin 1 MG CAPS Take 1 mg by mouth daily.   . carvedilol (COREG) 12.5 MG tablet Take 1 tablet (12.5 mg total) by mouth 2 (two) times daily.  .  Multiple Vitamin (MULTIVITAMIN WITH MINERALS) TABS Take 1 tablet by mouth daily.  [name] Kitchen omeprazole (PRILOSEC) 20 MG capsule Take 20 mg by mouth daily.  [name] Kitchen umeclidinium-vilanterol (ANORO ELLIPTA) 62.5-25 MCG/INH AEPB Inhale 1 puff into the lungs daily.  . valsartan (DIOVAN) 320 MG tablet Take 1 tablet (320 mg total) by mouth daily.  . [DISCONTINUED] atorvastatin (LIPITOR) 40 MG tablet Take 1 tablet (40 mg total) by mouth daily.   No facility-administered encounter medications on file as of 03/19/2021.     Review of Systems  Review of Systems  No chest pain with exertion.  No orthopnea or PND.  Comprehensive review of systems otherwise negative.  Physical Exam  BP 124/74   Pulse 95   Temp (!) 97.4 F (36.3 C) (Temporal)   Ht 5\' 1"  (1.549 m)   Wt 146 lb 9.6 oz (66.5 kg)   SpO2 100% Comment: on RA  BMI 27.70 kg/m   Wt Readings from Last 5 Encounters:  03/19/21 146 lb 9.6 oz (66.5 kg)  02/19/21 150 lb (68 kg)  11/18/20 153 lb 9.6 oz (69.7 kg)  07/07/20 145 lb (65.8 kg)  06/20/20 153 lb (69.4 kg)    BMI Readings from Last 5 Encounters:  03/19/21 27.70 kg/m  02/19/21 28.34 kg/m  11/18/20 29.02 kg/m  07/07/20 27.40 kg/m  06/20/20 28.91 kg/m     Physical Exam General: Well-appearing, sitting up in chair Eyes: EOMI, no icterus Neck: Supple, no JVP appreciated Cardiovascular: Warm, no edema Pulmonary: Clear to oscillation bilaterally, no wheezes Abdomen: Nondistended MSK: No synovitis, joint effusion Neuro: Normal gait, no weakness Psych: Normal mood, full affect   Assessment & Plan:   DOE: mild, with extreme exertion, yard work. Suspect related to emphysema, gas trapping, possible COPD (no PFTs). Trial  of Anoro.  If symptoms not improve, or worsen over time, we will pursue PFTs.  Tobacco abuse: Cutting back on cigarette smoking.  She was congratulated this.  Encouraged to continue this up to the point of full abstinence.  Lung cancer screening: She qualifies for  lung cancer screening given her pack-year history as well as age.  She is undergoing smoking cessation.  I had a long discussion with the aid of decision tool, [employer].  She states she does not wish to continue lung cancer screening at this time.  We discussed the risks and benefits and after shared decision making came to the conclusion to not pursue additional lung cancer screening.  Pulmonary nodule: Seen on CT lung cancer screening 11/2020, lung RADS 2.  Largest about 3 mm.  She does not want any screening.  She does not want any more CT scans.  Primary driver is cost which is unfortunate that understandable.   Return in about 6 months (around 09/19/2021).   Lanier Clam, MD 03/19/2021

## 2021-03-19 NOTE — Patient Instructions (Signed)
Nice to meet you  Try Anoro 1 puff daily.  I am hopeful this will improve your shortness of breath particularly with yard work, extreme exertion.  If is not helping after a few weeks, it is okay to stop this.  Please let me know if you stop the medication.  In the future, if symptoms not improving, or worsening, we we will again discuss obtaining pulmonary function or breathing test to further characterize the function of your lungs.  Return to clinic in 6 months for follow-up with Dr. Silas Flood,

## 2021-05-22 DIAGNOSIS — N6311 Unspecified lump in the right breast, upper outer quadrant: Secondary | ICD-10-CM | POA: Diagnosis not present

## 2021-05-22 DIAGNOSIS — N6310 Unspecified lump in the right breast, unspecified quadrant: Secondary | ICD-10-CM | POA: Diagnosis not present

## 2021-05-22 DIAGNOSIS — N644 Mastodynia: Secondary | ICD-10-CM | POA: Diagnosis not present

## 2021-05-29 ENCOUNTER — Other Ambulatory Visit: Payer: Self-pay | Admitting: Obstetrics and Gynecology

## 2021-05-29 DIAGNOSIS — N6310 Unspecified lump in the right breast, unspecified quadrant: Secondary | ICD-10-CM

## 2021-06-11 ENCOUNTER — Ambulatory Visit: Payer: Medicare HMO | Admitting: Internal Medicine

## 2021-06-11 ENCOUNTER — Encounter: Payer: Self-pay | Admitting: Internal Medicine

## 2021-06-11 ENCOUNTER — Other Ambulatory Visit: Payer: Self-pay

## 2021-06-11 VITALS — BP 130/84 | HR 81 | Ht 61.0 in | Wt 143.8 lb

## 2021-06-11 DIAGNOSIS — E785 Hyperlipidemia, unspecified: Secondary | ICD-10-CM

## 2021-06-11 DIAGNOSIS — F17200 Nicotine dependence, unspecified, uncomplicated: Secondary | ICD-10-CM | POA: Diagnosis not present

## 2021-06-11 DIAGNOSIS — I2510 Atherosclerotic heart disease of native coronary artery without angina pectoris: Secondary | ICD-10-CM | POA: Diagnosis not present

## 2021-06-11 DIAGNOSIS — J439 Emphysema, unspecified: Secondary | ICD-10-CM

## 2021-06-11 DIAGNOSIS — I10 Essential (primary) hypertension: Secondary | ICD-10-CM

## 2021-06-11 DIAGNOSIS — R69 Illness, unspecified: Secondary | ICD-10-CM | POA: Diagnosis not present

## 2021-06-11 LAB — COMPREHENSIVE METABOLIC PANEL
ALT: 11 IU/L (ref 0–32)
AST: 16 IU/L (ref 0–40)
Albumin/Globulin Ratio: 1.5 (ref 1.2–2.2)
Albumin: 4.1 g/dL (ref 3.7–4.7)
Alkaline Phosphatase: 86 IU/L (ref 44–121)
BUN/Creatinine Ratio: 17 (ref 12–28)
BUN: 15 mg/dL (ref 8–27)
Bilirubin Total: <0.2 mg/dL (ref 0.0–1.2)
CO2: 23 mmol/L (ref 20–29)
Calcium: 9.4 mg/dL (ref 8.7–10.3)
Chloride: 105 mmol/L (ref 96–106)
Creatinine, Ser: 0.87 mg/dL (ref 0.57–1.00)
Globulin, Total: 2.7 g/dL (ref 1.5–4.5)
Glucose: 91 mg/dL (ref 65–99)
Potassium: 3.8 mmol/L (ref 3.5–5.2)
Sodium: 143 mmol/L (ref 134–144)
Total Protein: 6.8 g/dL (ref 6.0–8.5)
eGFR: 71 mL/min/{1.73_m2} (ref >59)

## 2021-06-11 LAB — LIPID PANEL
Chol/HDL Ratio: 6.1 ratio — ABNORMAL HIGH (ref 0.0–4.4)
Cholesterol, Total: 221 mg/dL — ABNORMAL HIGH (ref 100–199)
HDL: 36 mg/dL — ABNORMAL LOW (ref >39)
LDL Chol Calc (NIH): 148 mg/dL — ABNORMAL HIGH (ref 0–99)
Triglycerides: 201 mg/dL — ABNORMAL HIGH (ref 0–149)
VLDL Cholesterol Cal: 37 mg/dL (ref 5–40)

## 2021-06-11 NOTE — Progress Notes (Signed)
OFFICE NOTE  Chief Complaint:  Follow-up   Primary Care Physician: Patient, No Pcp Per (Inactive)  HPI:  Meghan Lynn  is a 70 year old female with a history of coronary artery disease status post stenting of the right coronary with 2 Cypher stents and a Taxus stent to the proximal right coronary in 2004. She had a negative stress test in 2007 and otherwise has done pretty well, has been asymptomatic. She has had problems with ongoing tobacco abuse and continues to smoke, however, she says this has been a particularly difficult year. She reported that her husband died this year as well as her sister and brother-in-law. Given these multiple problems she has had a difficult time with grieving and has of course resorted back to smoking. Before she has tried Chantix but has failed that. She is doing some activity, including walking on a treadmill about 3 times a week. Her smoking is less than 1 pack per day, denies any alcohol or street drugs.    She reports since I last saw her that she underwent back surgery and has reported a marked improvement in her back pain. She is continuing to have some difficulty since her husband died and has started smoking again. I have provided her with Wellbutrin however she ran out of that but says that it was very helpful for her.  I saw Meghan Lynn back in the office today. Overall she is doing fairly well. Unfortunate she's had 3 Desyrel with the past year and has battled some depression. She ran out of her Wellbutrin and has started smoking again. She wishes to get back off the cigarettes and restart Wellbutrin. From a cardiac standpoint she denies any chest pain or worsening shortness of breath. She's only taking aspirin once a week due to some stomach upset. She's also concerned about the cost of valsartan and due to being on Medicare.  09/29/2016  Meghan Lynn returns today for follow-up. Recently she's been describing some more shortness of breath,  particularly with exertion. She is not exercising as much but does mow her lawn and do trimming and a number of physical activities for which she has to stop more frequently to catch her breath. She does not report any chest tightness with this but is fatigued reports her legs get weak somewhat and feel like they're going to give out on her. She has continued to smoke unfortunately. She does have a coronary artery disease history with PCI in 2007.  11/16/2016  [name] returns for follow-up today. She underwent a nuclear stress test which was negative for ischemia with an EF of 67%. He's had a marked improvement in her blood pressure today 140/88. In general blood pressure runs between 762-263 systolic, unfortunately she's had some cramping in her legs, particularly at night. This is become somewhat intolerable however she did not contact the office. Her renal profile one week after starting the medication showed slight increase in creatinine 0.97 from 0.84 but within normal limits. Potassium was 4.6.  06/22/2017  Meghan Lynn returns today for follow-up. Overall she seems to be doing well although she is reported some leg heaviness. She's had some trouble with this for a while noted recently when trying to push mower lawn she's had some weakness and fatigue in her legs. She seen her neurosurgeon who felt that this was not related to any significant spinal stenosis or other neurologic pathology. She does have a long-standing smoking history. She reports discomfort that starts in around the lower  back and radiates around both hips to the anterior thighs. She says is not burning in quality and is able to walk on a treadmill for a certain period of time before having to stop. She continues to smoke. Blood pressure is well-controlled today.  12/26/2017  Meghan Lynn returns today for follow-up.  She reports her legs have improved somewhat.  She is walking more now is back to work.  She recently has been having some  problems with cramping in her legs.  She cut her blood pressure medication in half which is a combination of irbesartan HCTZ.  She says that has improved her cramping, which suggest to me that she is sensitive to the diuretic.  Unfortunately the blood pressures not at goal today.  Is been about a year since she has had her last lipid profile.  07/18/2018  Meghan Lynn returns today for follow-up.  She reports leg stickiness.  She denied any worsening leg cramps after stopping her thiazide.  Blood pressure was elevated today 154/94 of her repeat cumulative 850 systolic.  She reports recent hair loss and notably that her hair was very fragile.  Is due for repeat lipid profile.  She denies any chest pain.  08/28/2019  Meghan Lynn is seen today in follow-up.  She says she still having some leg discomfort when walking on the treadmill.  This is proximal primarily in both legs and in the hip areas.  It is worse when she is walking up or down inclines.  She does have a history of prior lumbar spine surgery.  She had previous lower extremity arterial Dopplers which showed some atherosclerosis but no significant reduction in ABI.  She continues to smoke and is at risk for PAD.  11/18/2020  Meghan Lynn returns today for follow-up.  Unfortunately she continues to smoke.  She has a 50+ pack year smoking history.  She is not seen her PCP recently, in fact she is in the market for new primary.  She denies any symptoms of claudication but does have some known PAD.  Lower extremity arterial Dopplers were recently performed.  She denies any chest pain.  Recent lipids in February 21 showed total cholesterol 176, HDL 37, LDL 79 and triglycerides at 374.  She is on atorvastatin 40 mg daily.  02/19/2021  Meghan Lynn returns today for follow-up.  She has not stop smoking.  She had a CT lung cancer screening study which showed lung RADS 2 benign tiny pulmonary nodules up to 3.8 mm.  Repeat study was recommended in 1  year.  Emphysema and aortic atherosclerosis was noted.  She is due for repeat lab work.  Her PCP had stopped practicing and therefore she has no primary.  She does report shortness of breath with exertion.  She also starts to complain about some symptoms concerning for claudication.  She has known PAD without obstruction by lower extremity arterial Dopplers in 2020.  Blood pressure remains elevated.  06/11/2021  Mrs. Gabrys is seen today in follow-up.  Overall she seems she is doing pretty well from a cardiac standpoint.  Denies any chest pain or worsening shortness of breath.  I did send her to pulmonary and they felt that she had mild COPD.  They prescribed an Anoro Ellipta inhaler however this was too expensive for her.  She denies any significant pain with walking.  She had repeat Dopplers which showed no lower extremity arterial obstruction.  Blood pressure was elevated however after her adding amlodipine her blood pressure  is now better controlled.  She is overdue for repeat lipids in the past has had high triglycerides.  She also complained of some right breast tenderness.  This is been going on for a while and she has been trying to get a mammogram however apparently due to insurance she has been denied.  I advised her to reach out to her primary as this would be indicated for an acute concern not just for screening.  PMHx:  Past Medical History:  Diagnosis Date   Active smoker    Anginal pain ([place])    Arthritis    Asthma    patient denies   Bronchitis due to tobacco use    Burning sensation of feet    Coronary artery disease    3 stents to RCA   Dilatation of esophagus    Gastritis    GERD (gastroesophageal reflux disease)    Headache(784.0)    cluster every day   Headache, migraine    daily   Hyperlipidemia    Hypertension    Peptic ulcer disease    Pneumonia    Shortness of breath dyspnea    with exertion    Past Surgical History:  Procedure Laterality Date   BACK  SURGERY     BALLOON DILATION N/A 05/30/2013   Procedure: BALLOON DILATION;  Surgeon: Missy Sabins, MD;  Location: [facility] ENDOSCOPY;  Service: Endoscopy;  Laterality: N/A;   BALLOON DILATION N/A 08/02/2013   Procedure: BALLOON DILATION;  Surgeon: Missy Sabins, MD;  Location: [facility];  Service: Endoscopy;  Laterality: N/A;   BALLOON DILATION N/A 04/29/2016   Procedure: BALLOON DILATION;  Surgeon: Teena Irani, MD;  Location: [facility] ENDOSCOPY;  Service: Endoscopy;  Laterality: N/A;   BALLOON DILATION N/A 06/16/2020   Procedure: BALLOON DILATION;  Surgeon: Ronnette Juniper, MD;  Location: [facility];  Service: Gastroenterology;  Laterality: N/A;   BIOPSY  06/16/2020   Procedure: BIOPSY;  Surgeon: Ronnette Juniper, MD;  [facility] ENDOSCOPY;  Service: Gastroenterology;;   CARDIAC CATHETERIZATION  03/07/2002   moderte CAD 80% (mid RCA) - Dr. Myrtice Lauth   CARDIAC CATHETERIZATION  09/13/2003   Cypher 2.5x62mm and 2.5x18 and Taxus 2.75x8mm to distal, mid, prox RCA (Dr. Gerrie Nordmann)   [place]  05/2006   positive bruce protocol, low risk, EF 80%   CARDIOVASCULAR STRESS TEST  2010   COLONOSCOPY     COLONOSCOPY WITH PROPOFOL N/A 06/16/2020   Procedure: COLONOSCOPY WITH PROPOFOL;  Surgeon: Ronnette Juniper, MD;  Location: [facility];  Service: Gastroenterology;  Laterality: N/A;   COLONOSCOPY WITH PROPOFOL N/A 06/21/2020   Procedure: COLONOSCOPY WITH PROPOFOL;  Surgeon: Otis Brace, MD;  Location: [place];  Service: Gastroenterology;  Laterality: N/A;   CORONARY ANGIOPLASTY  2004   stents x 3   DILATATION & CURETTAGE/HYSTEROSCOPY WITH MYOSURE N/A 08/19/2016   Procedure: [place];  Surgeon: Servando Salina, MD;  Location: [facility];  Service: Gynecology;  Laterality: N/A;   ESOPHAGOGASTRODUODENOSCOPY N/A 05/30/2013   Procedure: ESOPHAGOGASTRODUODENOSCOPY (EGD);  Surgeon: Missy Sabins, MD;  Location: [facility] ENDOSCOPY;  Service: Endoscopy;  Laterality:  N/A;   ESOPHAGOGASTRODUODENOSCOPY N/A 08/02/2013   Procedure: ESOPHAGOGASTRODUODENOSCOPY (EGD);  Surgeon: Missy Sabins, MD;  Location: [facility] ENDOSCOPY;  Service: Endoscopy;  Laterality: N/A;  barb /ja   ESOPHAGOGASTRODUODENOSCOPY (EGD) WITH PROPOFOL N/A 04/29/2016   Procedure: ESOPHAGOGASTRODUODENOSCOPY (EGD) WITH PROPOFOL;  Surgeon: Teena Irani, MD;  Location: [facility] ENDOSCOPY;  Service: Endoscopy;  Laterality: N/A;   ESOPHAGOGASTRODUODENOSCOPY (  EGD) WITH PROPOFOL N/A 06/16/2020   Procedure: ESOPHAGOGASTRODUODENOSCOPY (EGD) WITH PROPOFOL;  Surgeon: Ronnette Juniper, MD;  Location: [place];  Service: Gastroenterology;  Laterality: N/A;   FRACTURE SURGERY     HEMOSTASIS CLIP PLACEMENT  06/21/2020   Procedure: HEMOSTASIS CLIP PLACEMENT;  Surgeon: Otis Brace, MD;  Location: [facility] ENDOSCOPY;  Service: Gastroenterology;;   ORIF ELBOW FRACTURE  2006   POLYPECTOMY  06/16/2020   Procedure: POLYPECTOMY;  Surgeon: Ronnette Juniper, MD;  Location: [place];  Service: Gastroenterology;;   POLYPECTOMY  06/21/2020   Procedure: POLYPECTOMY;  Surgeon: Otis Brace, MD;  Location: [facility] ENDOSCOPY;  Service: Gastroenterology;;   TRANSTHORACIC ECHOCARDIOGRAM  07/2008   normal LV function, mild MR, mild TR, trace pulm valve regurg    FAMHx:  Family History  Problem Relation Age of Onset   Diabetes Mother    CAD Mother        CABG   Bone cancer Father    Hypertension Father    Heart disease Brother    Kidney disease Sister    Cancer Sister    Diabetes Sister    Hyperlipidemia Sister    Hypertension Sister     SOCHx:   reports that she has been smoking cigarettes. She has a 27.00 pack-year smoking history. She has never used smokeless tobacco. She reports current alcohol use. She reports that she does not use drugs.  ALLERGIES:  Allergies  Allergen Reactions   Propoxyphene Nausea And Vomiting    Darvocet   Latex Itching    ROS: Pertinent items noted in HPI and remainder of comprehensive ROS otherwise  negative.  HOME MEDS: Current Outpatient Medications  Medication Sig Dispense Refill   amLODipine (NORVASC) 5 MG tablet Take 1 tablet (5 mg total) by mouth daily. 90 tablet 3   Biotin 1 MG CAPS Take 1 mg by mouth daily.      carvedilol (COREG) 12.5 MG tablet Take 1 tablet (12.5 mg total) by mouth 2 (two) times daily. 180 tablet 3   Multiple Vitamin (MULTIVITAMIN WITH MINERALS) TABS Take 1 tablet by mouth daily.     omeprazole (PRILOSEC) 20 MG capsule Take 20 mg by mouth daily.     valsartan (DIOVAN) 320 MG tablet Take 1 tablet (320 mg total) by mouth daily. 90 tablet 3   umeclidinium-vilanterol (ANORO ELLIPTA) 62.5-25 MCG/INH AEPB Inhale 1 puff into the lungs daily. (Patient not taking: Reported on 06/11/2021) 60 each 5   umeclidinium-vilanterol (ANORO ELLIPTA) 62.5-25 MCG/INH AEPB Inhale 1 puff into the lungs daily. 2 each 0   No current facility-administered medications for this visit.    LABS/IMAGING: No results found for this or any previous visit (from the past 48 hour(s)). No results found.  VITALS: BP 130/84 (BP Location: Left Arm, Patient Position: Sitting)   Pulse 81   Ht 5\' 1"  (1.549 m)   Wt 143 lb 12.8 oz (65.2 kg)   SpO2 95%   BMI 27.17 kg/m   EXAM: General appearance: alert and no distress Neck: no carotid bruit and no JVD Lungs: clear to auscultation bilaterally Heart: regular rate and rhythm, S1, S2 normal, no murmur, click, rub or gallop Abdomen: soft, non-tender; bowel sounds normal; no masses,  no organomegaly Extremities: extremities normal, atraumatic, no cyanosis or edema Pulses: 1+ DP/PT pulses bilaterally Skin: Skin color, texture, turgor normal. No rashes or lesions Neurologic: Grossly normal Psych: Pleasant  EKG: Deferred  ASSESSMENT: Bilateral leg pain with walking - PAD with no significant obstruction CAD status post PCI x3  to the right coronary artery 2007 Hypertension Dyslipidemia Tobacco abuse GERD Weight gain, hair  loss Emphysema  PLAN:  1.   Mrs. Mccreery seems to be doing well from a cardiac standpoint.  She denies any chest pain or worsening shortness of breath.  She reports improvement in her leg pain and was found to have no obstruction on lower extremity arterial Dopplers.  Blood pressure is now better controlled.  She is due for repeat lipids which we will get today.  She has been sent to pulmonary for mild emphysema.  She is not currently using her inhaler due to cost.  Follow-up with me annually or sooner as necessary.  Pixie Casino, MD, [facility] [place] Director of the Advanced Lipid Disorders &  Cardiovascular Risk Reduction Clinic Attending Cardiologist  Direct Dial: [phone]  Fax: [phone]  Website:  [email or web address] Taylen Wendland 06/11/2021, 8:09 AM

## 2021-06-11 NOTE — Patient Instructions (Signed)
Medication Instructions:  Your physician recommends that you continue on your current medications as directed. Please refer to the Current Medication list given to you today.  *If you need a refill on your cardiac medications before your next appointment, please call your pharmacy*   Lab Work: [place] today   If you have labs (blood work) drawn today and your tests are completely normal, you will receive your results only by: [place] (if you have MyChart) OR A paper copy in the mail If you have any lab test that is abnormal or we need to change your treatment, we will call you to review the results.   Testing/Procedures: NONE   Follow-Up: At [facility], you and your health needs are our priority.  As part of our continuing mission to provide you with exceptional heart care, we have created designated Provider Care Teams.  These Care Teams include your primary Cardiologist (physician) and Advanced Practice Providers (APPs -  Physician Assistants and Nurse Practitioners) who all work together to provide you with the care you need, when you need it.  We recommend signing up for the patient portal called "MyChart".  Sign up information is provided on this After Visit Summary.  MyChart is used to connect with patients for Virtual Visits (Telemedicine).  Patients are able to view lab/test results, encounter notes, upcoming appointments, etc.  Non-urgent messages can be sent to your provider as well.   To learn more about what you can do with MyChart, go to [email or web address].    Your next appointment:   12 month(s)  The format for your next appointment:   In Person  Provider:   You may see Pixie Casino, MD or one of the following Advanced Practice Providers on your designated Care Team:   Almyra Deforest, PA-C Fabian Sharp, PA-C or  Roby Lofts, [place]

## 2021-06-22 ENCOUNTER — Other Ambulatory Visit: Payer: Self-pay | Admitting: *Deleted

## 2021-06-22 ENCOUNTER — Telehealth: Payer: Self-pay

## 2021-06-22 ENCOUNTER — Encounter: Payer: Self-pay | Admitting: *Deleted

## 2021-06-22 DIAGNOSIS — N898 Other specified noninflammatory disorders of vagina: Secondary | ICD-10-CM | POA: Diagnosis not present

## 2021-06-22 DIAGNOSIS — Z114 Encounter for screening for human immunodeficiency virus [HIV]: Secondary | ICD-10-CM | POA: Diagnosis not present

## 2021-06-22 DIAGNOSIS — Z13820 Encounter for screening for osteoporosis: Secondary | ICD-10-CM | POA: Diagnosis not present

## 2021-06-22 DIAGNOSIS — Z113 Encounter for screening for infections with a predominantly sexual mode of transmission: Secondary | ICD-10-CM | POA: Diagnosis not present

## 2021-06-22 DIAGNOSIS — E785 Hyperlipidemia, unspecified: Secondary | ICD-10-CM

## 2021-06-22 DIAGNOSIS — R69 Illness, unspecified: Secondary | ICD-10-CM | POA: Diagnosis not present

## 2021-06-22 DIAGNOSIS — Z9189 Other specified personal risk factors, not elsewhere classified: Secondary | ICD-10-CM

## 2021-06-22 DIAGNOSIS — Z779 Other contact with and (suspected) exposures hazardous to health: Secondary | ICD-10-CM | POA: Diagnosis not present

## 2021-06-22 DIAGNOSIS — A5901 Trichomonal vulvovaginitis: Secondary | ICD-10-CM | POA: Diagnosis not present

## 2021-06-22 DIAGNOSIS — N760 Acute vaginitis: Secondary | ICD-10-CM | POA: Diagnosis not present

## 2021-06-22 DIAGNOSIS — Z124 Encounter for screening for malignant neoplasm of cervix: Secondary | ICD-10-CM | POA: Diagnosis not present

## 2021-06-22 DIAGNOSIS — Z01419 Encounter for gynecological examination (general) (routine) without abnormal findings: Secondary | ICD-10-CM | POA: Diagnosis not present

## 2021-06-22 DIAGNOSIS — Z01411 Encounter for gynecological examination (general) (routine) with abnormal findings: Secondary | ICD-10-CM | POA: Diagnosis not present

## 2021-06-22 DIAGNOSIS — Z789 Other specified health status: Secondary | ICD-10-CM

## 2021-06-22 DIAGNOSIS — Z1159 Encounter for screening for other viral diseases: Secondary | ICD-10-CM | POA: Diagnosis not present

## 2021-06-22 DIAGNOSIS — Z6827 Body mass index (BMI) 27.0-27.9, adult: Secondary | ICD-10-CM | POA: Diagnosis not present

## 2021-06-22 MED ORDER — ROSUVASTATIN CALCIUM 20 MG PO TABS: 20.0000 mg | ORAL_TABLET | Freq: Every day | ORAL | 3 refills | Status: DC

## 2021-06-22 NOTE — Progress Notes (Signed)
[facility])                                            [place] Team                                        Statin Quality Measure Assessment    06/22/2021  Meghan Lynn 07-20-1951 [id number]  Per review of chart and payor information, this patient has been flagged for non-adherence to the following CMS Quality Measure:   []  Statin Use in Persons with Diabetes  [x]  Statin Use in Persons with Cardiovascular Disease  The 10-year ASCVD risk score [name] [name], et al., 2013) is: 24.8%   Values used to calculate the score:     Age: 11 years     Sex: Female     Is Non-Hispanic African American: Yes     Diabetic: No     Tobacco smoker: Yes     Systolic Blood Pressure: 676 mmHg     Is BP treated: Yes     HDL Cholesterol: 36 mg/dL     Total Cholesterol: 221 mg/dL LDL 148 mg/dL 06/11/2021  Currently prescribed statin:  []  Yes [x]  No     Comments: N/A   History of statin use:            [x]  Yes []  No   Comments: Atorvastatin 40 mg qdaily and atorvastatin 20 mg Qdaily that her former PCP prescribed - currently patient does not have a PCP. Patient reported prior statin-induced muscle cramps (in the legs, arms, and chest) and self discontinued atorvastatin for that reason. Per discussion with patient, it seems her previous PCP  may not have been informed of the issue.   Patient reported that she wasn't aware of recent LDL results. I discussed the recent LDL being elevated and that Dr. Debara Pickett wanted to restart atorvastatin 40 mg QHS unless she was intolerant and if she was then atorvastatin 20 mg daily. She seemed opened to restarting atorvastatin if is needed, but hesitant about higher strengths. I did inform the patient to reach out to the cardiologist re: statin restart and educated the patient of alternative dosing if she is hesitant about restarting. Of note, she still has atorvastatin 40 mg (expires 06/2021) and atorvastatin  20 mg (expired 04/2021) at home. I provided education re: how to properly dispose expired medication.     Please consider ONE of the following recommendations highlighted in blue:   Initiate high intensity statin Atorvastatin 40mg  once daily, #90, 3 refills   Rosuvastatin 20mg  once daily, #90, 3 refills    Initiate moderate intensity          statin with reduced frequency if prior          statin intolerance 1x weekly, #13, 3 refills   2x weekly, #26, 3 refills   3x weekly, #39, 3 refills    Code for past statin intolerance or other exclusions (required annually)  Drug Induced Myopathy G72.0   Myalgia M79.1   Rhabdomyolysis M62.82   Prediabetes R73.03   Adverse effect of antihyperlipidemic and antiarteriosclerotic drugs, initial encounter P95.0D3O    Thank you for your time,  Kristeen Miss, [place] Cell: [phone]

## 2021-06-23 ENCOUNTER — Other Ambulatory Visit: Payer: Self-pay | Admitting: Obstetrics and Gynecology

## 2021-06-23 DIAGNOSIS — Z13820 Encounter for screening for osteoporosis: Secondary | ICD-10-CM

## 2021-06-25 ENCOUNTER — Other Ambulatory Visit: Payer: Self-pay | Admitting: *Deleted

## 2021-06-25 DIAGNOSIS — E785 Hyperlipidemia, unspecified: Secondary | ICD-10-CM

## 2021-07-08 ENCOUNTER — Ambulatory Visit: Payer: Medicare HMO

## 2021-07-08 ENCOUNTER — Ambulatory Visit
Admission: RE | Admit: 2021-07-08 | Discharge: 2021-07-08 | Disposition: A | Discharge status: Home / Self Care | Payer: Medicare HMO | Source: Ambulatory Visit | Attending: Obstetrics and Gynecology | Admitting: Obstetrics and Gynecology

## 2021-07-08 ENCOUNTER — Other Ambulatory Visit: Payer: Self-pay

## 2021-07-08 DIAGNOSIS — N644 Mastodynia: Secondary | ICD-10-CM | POA: Diagnosis not present

## 2021-07-08 DIAGNOSIS — R922 Inconclusive mammogram: Secondary | ICD-10-CM | POA: Diagnosis not present

## 2021-07-08 DIAGNOSIS — N6310 Unspecified lump in the right breast, unspecified quadrant: Secondary | ICD-10-CM

## 2021-11-21 ENCOUNTER — Other Ambulatory Visit: Payer: Self-pay | Admitting: Internal Medicine

## 2021-11-21 DIAGNOSIS — I10 Essential (primary) hypertension: Secondary | ICD-10-CM

## 2021-12-24 ENCOUNTER — Other Ambulatory Visit: Payer: Self-pay

## 2021-12-24 ENCOUNTER — Ambulatory Visit
Admission: RE | Admit: 2021-12-24 | Discharge: 2021-12-24 | Disposition: A | Discharge status: Home / Self Care | Payer: Medicare HMO | Source: Ambulatory Visit | Attending: Internal Medicine | Admitting: Internal Medicine

## 2021-12-24 DIAGNOSIS — R918 Other nonspecific abnormal finding of lung field: Secondary | ICD-10-CM

## 2021-12-24 DIAGNOSIS — F17210 Nicotine dependence, cigarettes, uncomplicated: Secondary | ICD-10-CM | POA: Diagnosis not present

## 2021-12-24 DIAGNOSIS — R69 Illness, unspecified: Secondary | ICD-10-CM | POA: Diagnosis not present

## 2022-02-19 ENCOUNTER — Other Ambulatory Visit: Payer: Self-pay | Admitting: Internal Medicine

## 2022-02-19 DIAGNOSIS — R918 Other nonspecific abnormal finding of lung field: Secondary | ICD-10-CM

## 2022-02-19 DIAGNOSIS — F17200 Nicotine dependence, unspecified, uncomplicated: Secondary | ICD-10-CM

## 2022-02-26 ENCOUNTER — Other Ambulatory Visit: Payer: Self-pay | Admitting: Internal Medicine

## 2022-03-08 DIAGNOSIS — E785 Hyperlipidemia, unspecified: Secondary | ICD-10-CM | POA: Diagnosis not present

## 2022-03-08 DIAGNOSIS — Z6828 Body mass index (BMI) 28.0-28.9, adult: Secondary | ICD-10-CM | POA: Diagnosis not present

## 2022-03-08 DIAGNOSIS — Z008 Encounter for other general examination: Secondary | ICD-10-CM | POA: Diagnosis not present

## 2022-03-08 DIAGNOSIS — I2510 Atherosclerotic heart disease of native coronary artery without angina pectoris: Secondary | ICD-10-CM | POA: Diagnosis not present

## 2022-03-08 DIAGNOSIS — Z87892 Personal history of anaphylaxis: Secondary | ICD-10-CM | POA: Diagnosis not present

## 2022-03-08 DIAGNOSIS — E663 Overweight: Secondary | ICD-10-CM | POA: Diagnosis not present

## 2022-03-08 DIAGNOSIS — K219 Gastro-esophageal reflux disease without esophagitis: Secondary | ICD-10-CM | POA: Diagnosis not present

## 2022-03-08 DIAGNOSIS — R69 Illness, unspecified: Secondary | ICD-10-CM | POA: Diagnosis not present

## 2022-03-08 DIAGNOSIS — R32 Unspecified urinary incontinence: Secondary | ICD-10-CM | POA: Diagnosis not present

## 2022-03-08 DIAGNOSIS — I10 Essential (primary) hypertension: Secondary | ICD-10-CM | POA: Diagnosis not present

## 2022-03-08 DIAGNOSIS — I739 Peripheral vascular disease, unspecified: Secondary | ICD-10-CM | POA: Diagnosis not present

## 2022-03-08 DIAGNOSIS — Z8249 Family history of ischemic heart disease and other diseases of the circulatory system: Secondary | ICD-10-CM | POA: Diagnosis not present

## 2022-03-08 DIAGNOSIS — Z833 Family history of diabetes mellitus: Secondary | ICD-10-CM | POA: Diagnosis not present

## 2022-05-31 ENCOUNTER — Other Ambulatory Visit: Payer: Self-pay | Admitting: Internal Medicine

## 2022-07-04 ENCOUNTER — Other Ambulatory Visit: Payer: Self-pay | Admitting: Internal Medicine

## 2022-08-11 ENCOUNTER — Other Ambulatory Visit: Payer: Self-pay | Admitting: Obstetrics and Gynecology

## 2022-08-11 DIAGNOSIS — Z1231 Encounter for screening mammogram for malignant neoplasm of breast: Secondary | ICD-10-CM

## 2022-09-01 ENCOUNTER — Ambulatory Visit: Payer: Medicare HMO

## 2022-09-03 ENCOUNTER — Other Ambulatory Visit: Payer: Self-pay | Admitting: Internal Medicine

## 2022-09-14 ENCOUNTER — Ambulatory Visit: Payer: Medicare HMO

## 2022-09-16 DIAGNOSIS — M79642 Pain in left hand: Secondary | ICD-10-CM | POA: Diagnosis not present

## 2022-09-16 DIAGNOSIS — J45909 Unspecified asthma, uncomplicated: Secondary | ICD-10-CM | POA: Insufficient documentation

## 2022-10-06 ENCOUNTER — Ambulatory Visit: Payer: Medicare HMO

## 2022-10-06 DIAGNOSIS — G5602 Carpal tunnel syndrome, left upper limb: Secondary | ICD-10-CM | POA: Diagnosis not present

## 2022-10-06 DIAGNOSIS — G5612 Other lesions of median nerve, left upper limb: Secondary | ICD-10-CM | POA: Diagnosis not present

## 2022-10-10 ENCOUNTER — Other Ambulatory Visit: Payer: Self-pay | Admitting: Internal Medicine

## 2022-10-18 DIAGNOSIS — G5602 Carpal tunnel syndrome, left upper limb: Secondary | ICD-10-CM | POA: Diagnosis not present

## 2022-10-18 DIAGNOSIS — G5622 Lesion of ulnar nerve, left upper limb: Secondary | ICD-10-CM | POA: Diagnosis not present

## 2022-10-26 ENCOUNTER — Ambulatory Visit
Admission: RE | Admit: 2022-10-26 | Discharge: 2022-10-26 | Disposition: A | Discharge status: Home / Self Care | Payer: Medicare HMO | Source: Ambulatory Visit | Attending: Obstetrics and Gynecology | Admitting: Obstetrics and Gynecology

## 2022-10-26 DIAGNOSIS — Z1231 Encounter for screening mammogram for malignant neoplasm of breast: Secondary | ICD-10-CM

## 2022-11-07 ENCOUNTER — Other Ambulatory Visit: Payer: Self-pay | Admitting: Internal Medicine

## 2022-12-01 ENCOUNTER — Other Ambulatory Visit: Payer: Self-pay | Admitting: Internal Medicine

## 2022-12-01 DIAGNOSIS — I10 Essential (primary) hypertension: Secondary | ICD-10-CM

## 2022-12-28 ENCOUNTER — Ambulatory Visit
Admission: RE | Admit: 2022-12-28 | Discharge: 2022-12-28 | Disposition: A | Discharge status: Home / Self Care | Payer: Medicare HMO | Source: Ambulatory Visit | Attending: Internal Medicine | Admitting: Internal Medicine

## 2022-12-28 DIAGNOSIS — F17210 Nicotine dependence, cigarettes, uncomplicated: Secondary | ICD-10-CM | POA: Diagnosis not present

## 2022-12-28 DIAGNOSIS — R69 Illness, unspecified: Secondary | ICD-10-CM | POA: Diagnosis not present

## 2022-12-28 DIAGNOSIS — R918 Other nonspecific abnormal finding of lung field: Secondary | ICD-10-CM

## 2022-12-28 DIAGNOSIS — F17200 Nicotine dependence, unspecified, uncomplicated: Secondary | ICD-10-CM

## 2023-01-13 ENCOUNTER — Other Ambulatory Visit: Payer: Self-pay | Admitting: *Deleted

## 2023-01-13 DIAGNOSIS — F17200 Nicotine dependence, unspecified, uncomplicated: Secondary | ICD-10-CM

## 2023-01-13 DIAGNOSIS — R918 Other nonspecific abnormal finding of lung field: Secondary | ICD-10-CM

## 2023-02-27 ENCOUNTER — Other Ambulatory Visit: Payer: Self-pay | Admitting: Internal Medicine

## 2023-02-27 DIAGNOSIS — I10 Essential (primary) hypertension: Secondary | ICD-10-CM

## 2023-03-24 ENCOUNTER — Other Ambulatory Visit: Payer: Self-pay | Admitting: Internal Medicine

## 2023-03-24 DIAGNOSIS — I10 Essential (primary) hypertension: Secondary | ICD-10-CM

## 2023-03-29 ENCOUNTER — Encounter: Payer: Self-pay | Admitting: Internal Medicine

## 2023-03-29 NOTE — Telephone Encounter (Signed)
Error

## 2023-04-01 ENCOUNTER — Other Ambulatory Visit: Payer: Self-pay | Admitting: Internal Medicine

## 2023-04-01 DIAGNOSIS — I10 Essential (primary) hypertension: Secondary | ICD-10-CM

## 2023-04-14 ENCOUNTER — Other Ambulatory Visit: Payer: Self-pay | Admitting: Internal Medicine

## 2023-04-14 DIAGNOSIS — I10 Essential (primary) hypertension: Secondary | ICD-10-CM

## 2023-04-20 DIAGNOSIS — Z008 Encounter for other general examination: Secondary | ICD-10-CM | POA: Diagnosis not present

## 2023-04-20 DIAGNOSIS — F17210 Nicotine dependence, cigarettes, uncomplicated: Secondary | ICD-10-CM | POA: Diagnosis not present

## 2023-04-20 DIAGNOSIS — K5900 Constipation, unspecified: Secondary | ICD-10-CM | POA: Diagnosis not present

## 2023-04-20 DIAGNOSIS — R32 Unspecified urinary incontinence: Secondary | ICD-10-CM | POA: Diagnosis not present

## 2023-04-20 DIAGNOSIS — Z8249 Family history of ischemic heart disease and other diseases of the circulatory system: Secondary | ICD-10-CM | POA: Diagnosis not present

## 2023-04-20 DIAGNOSIS — I951 Orthostatic hypotension: Secondary | ICD-10-CM | POA: Diagnosis not present

## 2023-04-20 DIAGNOSIS — E785 Hyperlipidemia, unspecified: Secondary | ICD-10-CM | POA: Diagnosis not present

## 2023-04-20 DIAGNOSIS — I10 Essential (primary) hypertension: Secondary | ICD-10-CM | POA: Diagnosis not present

## 2023-04-20 DIAGNOSIS — I2510 Atherosclerotic heart disease of native coronary artery without angina pectoris: Secondary | ICD-10-CM | POA: Diagnosis not present

## 2023-04-20 DIAGNOSIS — M1990 Unspecified osteoarthritis, unspecified site: Secondary | ICD-10-CM | POA: Diagnosis not present

## 2023-04-20 DIAGNOSIS — I700 Atherosclerosis of aorta: Secondary | ICD-10-CM | POA: Diagnosis not present

## 2023-04-20 DIAGNOSIS — K219 Gastro-esophageal reflux disease without esophagitis: Secondary | ICD-10-CM | POA: Diagnosis not present
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
## 2023-04-26 ENCOUNTER — Encounter: Payer: Self-pay | Admitting: Student

## 2023-04-26 ENCOUNTER — Ambulatory Visit: Payer: Medicare HMO | Attending: Student | Admitting: Student

## 2023-04-26 VITALS — BP 134/82 | HR 86 | Ht 61.0 in | Wt 145.0 lb

## 2023-04-26 DIAGNOSIS — I10 Essential (primary) hypertension: Secondary | ICD-10-CM

## 2023-04-26 DIAGNOSIS — J449 Chronic obstructive pulmonary disease, unspecified: Secondary | ICD-10-CM | POA: Diagnosis not present

## 2023-04-26 DIAGNOSIS — I2583 Coronary atherosclerosis due to lipid rich plaque: Secondary | ICD-10-CM

## 2023-04-26 DIAGNOSIS — Z720 Tobacco use: Secondary | ICD-10-CM | POA: Diagnosis not present

## 2023-04-26 DIAGNOSIS — I2510 Atherosclerotic heart disease of native coronary artery without angina pectoris: Secondary | ICD-10-CM | POA: Diagnosis not present

## 2023-04-26 DIAGNOSIS — E785 Hyperlipidemia, unspecified: Secondary | ICD-10-CM | POA: Diagnosis not present

## 2023-04-26 MED ORDER — NITROGLYCERIN 0.4 MG SL SUBL: 0.4000 mg | SUBLINGUAL_TABLET | SUBLINGUAL | 3 refills | Status: AC | PRN

## 2023-04-26 NOTE — Patient Instructions (Signed)
Medication Instructions:  Your physician has recommended you make the following change in your medication:  START: Nitroglycerin 0.4 mg place 1 tablet under your tongue every 5 minutes (up to three times) as needed for chest pain.  *If you need a refill on your cardiac medications before your next appointment, please call your pharmacy*   Lab Work: Your physician recommends that you have the following labs drawn today: Lipid panel, Direct LDL and CMP  If you have labs (blood work) drawn today and your tests are completely normal, you will receive your results only by: MyChart Message (if you have MyChart) OR A paper copy in the mail If you have any lab test that is abnormal or we need to change your treatment, we will call you to review the results.   Testing/Procedures: NONE   Follow-Up: At [facility], you and your health needs are our priority.  As part of our continuing mission to provide you with exceptional heart care, we have created designated Provider Care Teams.  These Care Teams include your primary Cardiologist (physician) and Advanced Practice Providers (APPs -  Physician Assistants and Nurse Practitioners) who all work together to provide you with the care you need, when you need it.  We recommend signing up for the patient portal called "MyChart".  Sign up information is provided on this After Visit Summary.  MyChart is used to connect with patients for Virtual Visits (Telemedicine).  Patients are able to view lab/test results, encounter notes, upcoming appointments, etc.  Non-urgent messages can be sent to your provider as well.   To learn more about what you can do with MyChart, go to [email or web address].    Your next appointment:   1 year(s)  Provider:   Chrystie Nose, MD

## 2023-04-27 ENCOUNTER — Other Ambulatory Visit: Payer: Self-pay | Admitting: Internal Medicine

## 2023-04-27 DIAGNOSIS — I10 Essential (primary) hypertension: Secondary | ICD-10-CM

## 2023-04-27 LAB — COMPREHENSIVE METABOLIC PANEL
ALT: 15 IU/L (ref 0–32)
AST: 18 IU/L (ref 0–40)
Albumin/Globulin Ratio: 1.4 (ref 1.2–2.2)
Albumin: 4.1 g/dL (ref 3.8–4.8)
Alkaline Phosphatase: 88 IU/L (ref 44–121)
BUN/Creatinine Ratio: 10 — ABNORMAL LOW (ref 12–28)
BUN: 8 mg/dL (ref 8–27)
Bilirubin Total: <0.2 mg/dL (ref 0.0–1.2)
CO2: 22 mmol/L (ref 20–29)
Calcium: 9.5 mg/dL (ref 8.7–10.3)
Chloride: 104 mmol/L (ref 96–106)
Creatinine, Ser: 0.77 mg/dL (ref 0.57–1.00)
Globulin, Total: 3 g/dL (ref 1.5–4.5)
Glucose: 119 mg/dL — ABNORMAL HIGH (ref 70–99)
Potassium: 3.7 mmol/L (ref 3.5–5.2)
Sodium: 142 mmol/L (ref 134–144)
Total Protein: 7.1 g/dL (ref 6.0–8.5)
eGFR: 82 mL/min/{1.73_m2} (ref >59)

## 2023-04-27 LAB — LIPID PANEL
Chol/HDL Ratio: 4.6 ratio — ABNORMAL HIGH (ref 0.0–4.4)
Cholesterol, Total: 178 mg/dL (ref 100–199)
HDL: 39 mg/dL — ABNORMAL LOW (ref >39)
LDL Chol Calc (NIH): 85 mg/dL (ref 0–99)
Triglycerides: 331 mg/dL — ABNORMAL HIGH (ref 0–149)
VLDL Cholesterol Cal: 54 mg/dL — ABNORMAL HIGH (ref 5–40)

## 2023-04-27 LAB — LDL CHOLESTEROL, DIRECT: LDL Direct: 82 mg/dL (ref 0–99)

## 2023-05-02 ENCOUNTER — Other Ambulatory Visit: Payer: Self-pay

## 2023-05-02 DIAGNOSIS — E785 Hyperlipidemia, unspecified: Secondary | ICD-10-CM

## 2023-05-07 ENCOUNTER — Other Ambulatory Visit: Payer: Self-pay | Admitting: Internal Medicine

## 2023-05-11 ENCOUNTER — Ambulatory Visit: Payer: Medicare HMO | Admitting: Pulmonary Disease

## 2023-05-11 ENCOUNTER — Encounter: Payer: Self-pay | Admitting: Pulmonary Disease

## 2023-05-11 VITALS — BP 130/84 | HR 77 | Ht 61.0 in | Wt 147.4 lb

## 2023-05-11 DIAGNOSIS — R0609 Other forms of dyspnea: Secondary | ICD-10-CM | POA: Diagnosis not present

## 2023-05-11 MED ORDER — ANORO ELLIPTA 62.5-25 MCG/ACT IN AEPB: 1.0000 | INHALATION_SPRAY | Freq: Every day | RESPIRATORY_TRACT | 11 refills | Status: DC

## 2023-05-11 MED ORDER — ALBUTEROL SULFATE HFA 108 (90 BASE) MCG/ACT IN AERS: 2.0000 | INHALATION_SPRAY | Freq: Four times a day (QID) | RESPIRATORY_TRACT | 6 refills | Status: DC | PRN

## 2023-05-11 NOTE — Progress Notes (Signed)
@Patient  ID: Meghan Lynn, female    DOB: [birth year], 72 y.o.   MRN: [id number]  Chief Complaint  Patient presents with   Follow-up    CT results     Referring provider: Carlos Levering, NP  HPI:   72 y.o. whom we are seeing follow up for evaluation of dyspnea on exertion, emphysema.  Note from cardiologist reviewed.  Doing okay.  Last seen 2 years ago.  At that time she no longer wants to go through with lung cancer screening.  We tried Anoro.  Seem to help.  Never followed up.  Says meds most to experience.  First time.  Of this 2 years later.  She does continue to have some dyspnea particularly walking long distances mowing the lawn etc.  Discussed role and rationale for inhaler therapy as it seemed to help in the past.  We again discussed lung cancer screening, she is okay doing this, wants to continue with Dr. Blanchie Dessert office.  HPI at initial visit: Overall, patient feels well.  She is not sure why she is here.  Discussed finding of emphysema on CT scan as well as small tiny nodules.  She endorsed yes this is likely the reason she is here.  Her chief reason for seeing me is dyspnea exertion.  Dyspnea exertion described as mild.  Occurs with extreme exertion such as yard work.  She rest for a few minutes and goes back and complete her task.  She has a system where she does all her yard work, Dietitian, bush trimming, weed eating/edging, melena over the course of 3 days.  Otherwise, on day-to-day basis, she denies any dyspnea or respiratory symptoms.  No timing during day when things are better or worse.  No environmental or seasonal changes.  No other alleviating or exacerbating factors.  Discussed finding of recent lung cancer CT screening 11/2020 which on my review reveals scattered small pulmonary nodules as well as mild emphysematous changes primarily at the apices.  Discussed at length the rationale for lung cancer screening.  She states she does not want any more screening.  No  more CT scans.  Discussed following the small nodules.  She understands.  We can discuss at a later date.  However, she is on a fixed income, does not see benefit to doing these tests and it causes quite a financial burden which is understandable.  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   Lab Results: Personally reviewed, no significant elevation of eosinophils, no  anemia CBC    Component Value Date/Time   WBC 10.2 07/07/2020 1644   WBC 12.7 (H) 06/22/2020 0328   RBC 5.09 07/07/2020 1644   RBC 4.39 06/22/2020 0328   HGB 13.5 07/07/2020 1644   HGB 11.5 (L) 06/22/2020 0328   HCT 41.9 (A) 07/07/2020 1644   HCT 35.6 (L) 06/22/2020 0328   PLT 213 06/22/2020 0328   MCV 82.4 07/07/2020 1644   MCH 26.5 (A) 07/07/2020 1644   MCH 26.2 06/22/2020 0328   MCHC 32.2 07/07/2020 1644   MCHC 32.3 06/22/2020 0328   RDW 15.1 06/22/2020 0328   LYMPHSABS 6.6 (H) 06/20/2020 1824   MONOABS 0.2 06/20/2020 1824   EOSABS 0.0 06/20/2020 1824   BASOSABS 0.3 (H) 06/20/2020 1824    BMET    Component Value Date/Time   NA 142 04/26/2023 1406   K 3.7 04/26/2023 1406   CL 104 04/26/2023 1406   CO2 22 04/26/2023 1406   GLUCOSE 119 (H)  04/26/2023 1406   GLUCOSE 110 (H) 06/22/2020 0328   BUN 8 04/26/2023 1406   CREATININE 0.77 04/26/2023 1406   CREATININE 0.94 01/03/2017 1228   CALCIUM 9.5 04/26/2023 1406   GFRNONAA 62 07/07/2020 1647   GFRAA 71 07/07/2020 1647    BNP No results found for: "BNP"  ProBNP No results found for: "PROBNP"  Specialty Problems       Pulmonary Problems   Shortness of breath   Asthma    patient denies      Bronchitis due to tobacco use   Pneumonia    Allergies  Allergen Reactions   Atorvastatin Other (See Comments)    Muscle cramping   Propoxyphene Nausea And Vomiting    Darvocet   Latex Itching    Immunization History  Administered Date(s) Administered   COVID-19, mRNA, vaccine(Comirnaty)12 years and older 11/22/2022   Fluad Quad(high Dose 65+) 12/26/2019, 09/06/2022   Influenza, High Dose Seasonal PF 09/26/2018   Moderna Sars-Covid-2 Vaccination 02/02/2020, 02/25/2020, 01/07/2021    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.0)    cluster every day   Headache, migraine    daily   Hyperlipidemia    Hypertension    Peptic ulcer disease    Pneumonia    Shortness of breath dyspnea    with exertion    Tobacco History: Social History   Tobacco Use  Smoking Status Every Day   Packs/day: 0.60   Years: 45.00   Additional pack years: 0.00   Total pack years: 27.00   Types: Cigarettes  Smokeless Tobacco Never  Tobacco Comments   Smokes 7 packs a week of cigarettes. 05/11/23 Tay   Ready to quit: Not Answered Counseling given: Not Answered Tobacco comments: Smokes 7 packs a week of cigarettes. 05/11/23 Tay   Continue to not smoke  Outpatient Encounter Medications as of  05/11/2023  Medication Sig   albuterol (VENTOLIN HFA) 108 (90 Base) MCG/ACT inhaler Inhale 2 puffs into the lungs every 6 (six) hours as needed for wheezing or shortness of breath.   amLODipine (NORVASC) 5 MG tablet Take 1 tablet (5 mg  total) by mouth daily. Please call our office to schedule an appt for future refills. Thank you 3rd and final attempt.   Biotin 1 MG CAPS Take 1 mg by mouth daily.    carvedilol (COREG) 12.5 MG tablet TAKE 1 TABLET (12.5 MG TOTAL) BY MOUTH 2 (TWO) TIMES DAILY WITH A MEAL. KEEP OV.   Multiple Vitamin (MULTIVITAMIN WITH MINERALS) TABS Take 1 tablet by mouth daily.   nitroGLYCERIN (NITROSTAT) 0.4 MG SL tablet Place 1 tablet (0.4 mg total) under the tongue every 5 (five) minutes as needed for chest pain.   omeprazole (PRILOSEC) 20 MG capsule Take 20 mg by mouth daily.   rosuvastatin (CRESTOR) 20 MG tablet TAKE 1 TABLET BY MOUTH DAILY. PATIENT MUST SCHEDULE APPOINTMENT FOR FUTURE REFILLS FIRST ATTEMPT   umeclidinium-vilanterol (ANORO ELLIPTA) 62.5-25 MCG/ACT AEPB Inhale 1 puff into the lungs daily.   valsartan (DIOVAN) 320 MG tablet TAKE 1 TABLET (320 MG TOTAL) BY MOUTH DAILY. KEEP OV.   [DISCONTINUED] umeclidinium-vilanterol (ANORO ELLIPTA) 62.5-25 MCG/INH AEPB Inhale 1 puff into the lungs daily.   [DISCONTINUED] umeclidinium-vilanterol (ANORO ELLIPTA) 62.5-25 MCG/INH AEPB Inhale 1 puff into the lungs daily.   No facility-administered encounter medications on file as of 05/11/2023.     Review of Systems  Review of Systems  N/a  Physical Exam  BP 130/84 (BP Location: Left Arm)   Pulse 77   Ht 5\' 1"  (1.549 m)   Wt 147 lb 6.4 oz (66.9 kg)   SpO2 95%   BMI 27.85 kg/m   Wt Readings from Last 5 Encounters:  05/11/23 147 lb 6.4 oz (66.9 kg)  04/26/23 145 lb (65.8 kg)  06/11/21 143 lb 12.8 oz (65.2 kg)  03/19/21 146 lb 9.6 oz (66.5 kg)  02/19/21 150 lb (68 kg)    BMI Readings from Last 5 Encounters:  05/11/23 27.85 kg/m  04/26/23 27.40 kg/m  06/11/21  27.17 kg/m  03/19/21 27.70 kg/m  02/19/21 28.34 kg/m     Physical Exam General: Well-appearing, sitting up in chair Eyes: EOMI, no icterus Neck: Supple, no JVP appreciated Cardiovascular: Warm, no edema Pulmonary: Clear to auscultation bilaterally, normal work of breathing Abdomen: Nondistended MSK: No synovitis, joint effusion Neuro: Normal gait, no weakness Psych: Normal mood, full affect   Assessment & Plan:   DOE: mild, with extreme exertion, yard work. Suspect related to emphysema, gas trapping, possible COPD (no PFTs). Trial of Anoro seem to help but too expensive in the past.  Anoro prescribed today, albuterol prescribed to be used as needed, encouraged use prior to exertion, walking long distances to work via the parking lot or mowing the grass etc.  Tobacco abuse: Qualifies for lung cancer screening.  She prefers to continue this via Dr. Rennis Golden, cardiologist office.  Encouraged to continue yearly CT scans.   Return in about 3 months (around 08/11/2023).   Karren Burly, MD 05/11/2023

## 2023-05-11 NOTE — Patient Instructions (Signed)
Nice to see you again  Use Anoro 1 puff once a day - if too expensive send me a message and I will look into an alternative  Use albuterol 2 puff as needed - think of using it before you go to work or do yard work  Return to clinic in 3 months or sooner as needed

## 2023-05-18 ENCOUNTER — Telehealth: Payer: Self-pay | Admitting: Internal Medicine

## 2023-05-18 NOTE — Telephone Encounter (Signed)
Pt c/o medication issue:  1. Name of Medication: Nervive   2. How are you currently taking this medication (dosage and times per day)?   3. Are you having a reaction (difficulty breathing--STAT)?   4. What is your medication issue? Patient wants to make sure this is okay to take, that is won't interact with any of the other medication is currently taking.

## 2023-05-18 NOTE — Telephone Encounter (Signed)
Spoke with patient to clarify what Angus Seller was. She said this is a supplement that her pharmacist recommended for sciatic pain. Will send to Pharmacy team to review if OK to take w/current medications

## 2023-05-18 NOTE — Telephone Encounter (Signed)
Ok to take. If she doesn't notice an improvement in her symptoms in 1-2 months would probably stop the supplement though.

## 2023-05-18 NOTE — Telephone Encounter (Signed)
Patient called w/advice from PharmD She voiced understanding

## 2023-05-26 ENCOUNTER — Other Ambulatory Visit: Payer: Self-pay

## 2023-05-26 DIAGNOSIS — E785 Hyperlipidemia, unspecified: Secondary | ICD-10-CM | POA: Diagnosis not present

## 2023-05-26 LAB — LIPID PANEL
Chol/HDL Ratio: 4.5 ratio — ABNORMAL HIGH (ref 0.0–4.4)
Cholesterol, Total: 180 mg/dL (ref 100–199)
HDL: 40 mg/dL (ref >39)
LDL Chol Calc (NIH): 103 mg/dL — ABNORMAL HIGH (ref 0–99)
Triglycerides: 215 mg/dL — ABNORMAL HIGH (ref 0–149)
VLDL Cholesterol Cal: 37 mg/dL (ref 5–40)

## 2023-08-08 ENCOUNTER — Encounter: Payer: Self-pay | Admitting: Obstetrics and Gynecology

## 2023-08-08 DIAGNOSIS — Z1231 Encounter for screening mammogram for malignant neoplasm of breast: Secondary | ICD-10-CM

## 2023-08-08 DIAGNOSIS — N644 Mastodynia: Secondary | ICD-10-CM | POA: Diagnosis not present

## 2023-08-10 ENCOUNTER — Other Ambulatory Visit: Payer: Self-pay | Admitting: Obstetrics and Gynecology

## 2023-08-10 DIAGNOSIS — N644 Mastodynia: Secondary | ICD-10-CM

## 2023-08-15 ENCOUNTER — Other Ambulatory Visit: Payer: Self-pay | Admitting: Obstetrics and Gynecology

## 2023-08-15 DIAGNOSIS — N644 Mastodynia: Secondary | ICD-10-CM

## 2023-08-18 ENCOUNTER — Ambulatory Visit
Admission: RE | Admit: 2023-08-18 | Discharge: 2023-08-18 | Disposition: A | Discharge status: Home / Self Care | Payer: Medicare HMO | Source: Ambulatory Visit | Attending: Obstetrics and Gynecology | Admitting: Obstetrics and Gynecology

## 2023-08-18 DIAGNOSIS — N644 Mastodynia: Secondary | ICD-10-CM

## 2023-09-21 DIAGNOSIS — Z01419 Encounter for gynecological examination (general) (routine) without abnormal findings: Secondary | ICD-10-CM | POA: Diagnosis not present

## 2023-09-21 DIAGNOSIS — Z0000 Encounter for general adult medical examination without abnormal findings: Secondary | ICD-10-CM | POA: Diagnosis not present

## 2023-09-21 DIAGNOSIS — N841 Polyp of cervix uteri: Secondary | ICD-10-CM | POA: Diagnosis not present

## 2023-09-21 DIAGNOSIS — Z124 Encounter for screening for malignant neoplasm of cervix: Secondary | ICD-10-CM | POA: Diagnosis not present

## 2023-09-21 DIAGNOSIS — Z01411 Encounter for gynecological examination (general) (routine) with abnormal findings: Secondary | ICD-10-CM | POA: Diagnosis not present

## 2023-09-21 DIAGNOSIS — Z113 Encounter for screening for infections with a predominantly sexual mode of transmission: Secondary | ICD-10-CM | POA: Diagnosis not present

## 2023-09-23 ENCOUNTER — Telehealth: Payer: Self-pay | Admitting: Gastroenterology

## 2023-09-23 NOTE — Telephone Encounter (Signed)
Good morning Dr. Tomasa Rand,    Supervising Provider 09/23/2023 AM   This patient is requesting to transfer her care over to [facility]. Patient is wishing to be seen for endoscopy and colonoscopy. Patient has history with [facility] and last had procedures on 06/16/2020. Patient also had a repeat colonoscopy on 06/21/2020. Patient is wishing to transfer her care due to stating she had a bad and frightening experience during her last procedure with [facility] and does not wish to return for her care. Patient's previous records are in EPIC Please review and advise on scheduling.    Thank you.

## 2023-09-27 NOTE — Telephone Encounter (Signed)
Called patient to schedule. Patient did not wish to continue scheduling at this time.

## 2023-09-28 ENCOUNTER — Other Ambulatory Visit: Payer: Self-pay | Admitting: Internal Medicine

## 2023-09-29 DIAGNOSIS — N841 Polyp of cervix uteri: Secondary | ICD-10-CM | POA: Diagnosis not present

## 2023-09-29 DIAGNOSIS — L989 Disorder of the skin and subcutaneous tissue, unspecified: Secondary | ICD-10-CM | POA: Diagnosis not present

## 2023-09-29 DIAGNOSIS — B372 Candidiasis of skin and nail: Secondary | ICD-10-CM | POA: Diagnosis not present

## 2023-10-05 DIAGNOSIS — N939 Abnormal uterine and vaginal bleeding, unspecified: Secondary | ICD-10-CM | POA: Diagnosis not present

## 2023-10-13 DIAGNOSIS — B3731 Acute candidiasis of vulva and vagina: Secondary | ICD-10-CM | POA: Diagnosis not present

## 2023-10-13 DIAGNOSIS — B372 Candidiasis of skin and nail: Secondary | ICD-10-CM | POA: Diagnosis not present

## 2023-12-26 ENCOUNTER — Other Ambulatory Visit: Payer: Self-pay | Admitting: Obstetrics and Gynecology

## 2023-12-26 DIAGNOSIS — Z1231 Encounter for screening mammogram for malignant neoplasm of breast: Secondary | ICD-10-CM

## 2023-12-30 ENCOUNTER — Ambulatory Visit
Admission: RE | Admit: 2023-12-30 | Discharge: 2023-12-30 | Disposition: A | Discharge status: Home / Self Care | Payer: Medicare HMO | Source: Ambulatory Visit | Attending: Internal Medicine | Admitting: Internal Medicine

## 2023-12-30 DIAGNOSIS — F17210 Nicotine dependence, cigarettes, uncomplicated: Secondary | ICD-10-CM | POA: Diagnosis not present

## 2023-12-30 DIAGNOSIS — F17200 Nicotine dependence, unspecified, uncomplicated: Secondary | ICD-10-CM

## 2023-12-30 DIAGNOSIS — R918 Other nonspecific abnormal finding of lung field: Secondary | ICD-10-CM

## 2024-01-12 ENCOUNTER — Ambulatory Visit: Payer: Medicare HMO

## 2024-01-16 ENCOUNTER — Other Ambulatory Visit: Payer: Self-pay | Admitting: *Deleted

## 2024-01-16 ENCOUNTER — Encounter: Payer: Self-pay | Admitting: Internal Medicine

## 2024-01-16 DIAGNOSIS — F17200 Nicotine dependence, unspecified, uncomplicated: Secondary | ICD-10-CM

## 2024-01-16 DIAGNOSIS — R918 Other nonspecific abnormal finding of lung field: Secondary | ICD-10-CM

## 2024-01-26 ENCOUNTER — Ambulatory Visit
Admission: RE | Admit: 2024-01-26 | Discharge: 2024-01-26 | Disposition: A | Discharge status: Home / Self Care | Payer: Medicare HMO | Source: Ambulatory Visit | Attending: Obstetrics and Gynecology | Admitting: Obstetrics and Gynecology

## 2024-01-26 DIAGNOSIS — Z1231 Encounter for screening mammogram for malignant neoplasm of breast: Secondary | ICD-10-CM | POA: Diagnosis not present

## 2024-02-01 ENCOUNTER — Telehealth: Payer: Self-pay | Admitting: Student

## 2024-02-01 ENCOUNTER — Other Ambulatory Visit: Payer: Self-pay

## 2024-02-01 ENCOUNTER — Encounter (HOSPITAL_COMMUNITY): Payer: Self-pay | Admitting: *Deleted

## 2024-02-01 ENCOUNTER — Emergency Department (HOSPITAL_COMMUNITY)
Admission: EM | Admit: 2024-02-01 | Discharge: 2024-02-02 | Discharge status: Against Medical Advice | Payer: Medicare HMO | Attending: Emergency Medicine | Admitting: Emergency Medicine

## 2024-02-01 ENCOUNTER — Emergency Department (HOSPITAL_COMMUNITY): Payer: Medicare HMO

## 2024-02-01 DIAGNOSIS — R079 Chest pain, unspecified: Secondary | ICD-10-CM | POA: Insufficient documentation

## 2024-02-01 DIAGNOSIS — Z5321 Procedure and treatment not carried out due to patient leaving prior to being seen by health care provider: Secondary | ICD-10-CM | POA: Insufficient documentation

## 2024-02-01 DIAGNOSIS — I2510 Atherosclerotic heart disease of native coronary artery without angina pectoris: Secondary | ICD-10-CM | POA: Insufficient documentation

## 2024-02-01 DIAGNOSIS — R0789 Other chest pain: Secondary | ICD-10-CM | POA: Diagnosis not present

## 2024-02-01 DIAGNOSIS — I700 Atherosclerosis of aorta: Secondary | ICD-10-CM | POA: Diagnosis not present

## 2024-02-01 DIAGNOSIS — R918 Other nonspecific abnormal finding of lung field: Secondary | ICD-10-CM | POA: Diagnosis not present

## 2024-02-01 DIAGNOSIS — R0602 Shortness of breath: Secondary | ICD-10-CM | POA: Diagnosis not present

## 2024-02-01 LAB — TROPONIN I (HIGH SENSITIVITY): Troponin I (High Sensitivity): 4 ng/L (ref <18)

## 2024-02-01 LAB — BASIC METABOLIC PANEL
Anion gap: 12 (ref 5–15)
BUN: 8 mg/dL (ref 8–23)
CO2: 23 mmol/L (ref 22–32)
Calcium: 9.5 mg/dL (ref 8.9–10.3)
Chloride: 106 mmol/L (ref 98–111)
Creatinine, Ser: 0.95 mg/dL (ref 0.44–1.00)
GFR, Estimated: >60 mL/min (ref >60)
Glucose, Bld: 110 mg/dL — ABNORMAL HIGH (ref 70–99)
Potassium: 3.8 mmol/L (ref 3.5–5.1)
Sodium: 141 mmol/L (ref 135–145)

## 2024-02-01 LAB — CBC
HCT: 54.4 % — ABNORMAL HIGH (ref 36.0–46.0)
Hemoglobin: 17.3 g/dL — ABNORMAL HIGH (ref 12.0–15.0)
MCH: 25.8 pg — ABNORMAL LOW (ref 26.0–34.0)
MCHC: 31.8 g/dL (ref 30.0–36.0)
MCV: 81.1 fL (ref 80.0–100.0)
Platelets: 230 10*3/uL (ref 150–400)
RBC: 6.71 MIL/uL — ABNORMAL HIGH (ref 3.87–5.11)
RDW: 16 % — ABNORMAL HIGH (ref 11.5–15.5)
WBC: 8.6 10*3/uL (ref 4.0–10.5)
nRBC: 0 % (ref 0.0–0.2)

## 2024-02-01 NOTE — ED Triage Notes (Signed)
 The pt has had chest pain intermitently for 5 days  and the pain radiates down her lt arm

## 2024-02-01 NOTE — Telephone Encounter (Signed)
 Patient identification verified by 2 forms. Meghan Cooks, RN    Called and spoke to patient  Patient states:   -chest pain on going for 1 week   -chest pain has become more frequent   -pain occurs with activity and exertions   -twice this week developed pain in left arm as well   -last episode of chest pain occurred this morning   -has NTG but has not taken it because she does not want migraine  -chest pain will last 5-10 minutes   -has cardiac history, has three stents   -unsure if this is cardiac or if her esophagus needs to be dilated  Advised patient to present to ED for evaluation  Patient agreeable, no questions at this time

## 2024-02-01 NOTE — Telephone Encounter (Signed)
   Pt c/o of Chest Pain: STAT if active CP, including tightness, pressure, jaw pain, radiating pain to shoulder/upper arm/back, CP unrelieved by Nitro. Symptoms reported of SOB, nausea, vomiting, sweating.  1. Are you having CP right now?  No    2. Are you experiencing any other symptoms (ex. SOB, nausea, vomiting, sweating)? SOB but nothing currently    3. Is your CP continuous or coming and going? Coming and going    4. Have you taken Nitroglycerin ? No    5. How long have you been experiencing CP? A week     6. If NO CP at time of call then end call with telling Pt to call back or call 911 if Chest pain returns prior to return call from triage team.

## 2024-02-01 NOTE — ED Provider Triage Note (Signed)
 Emergency Medicine Provider Triage Evaluation Note  Meghan Lynn , a 73 y.o. female  was evaluated in triage.  Pt complains of .  Chest pain, known history of CAD.  Patient has been having intermittent mostly exertional chest pain however has been getting progressively worse.  She states that she got very short of breath trying to cook last night.  She has pain radiating down her left arm.  Tonight she had onset of the same symptoms without exertion.  She did not take nitroglycerin  because gives me a migraine for 3 days afterward.  Review of Systems  Positive: Chest pain of breath Negative: Fever  Physical Exam  BP (!) 181/99 (BP Location: Left Arm)   Pulse 79   Temp 97.6 F (36.4 C) (Oral)   Resp 18   Ht 5' 1 (1.549 m)   Wt 66.9 kg   SpO2 100%   BMI 27.87 kg/m  Gen:   Awake, no distress   Resp:  Normal effort  MSK:   Moves extremities without difficulty  Other:    Medical Decision Making  Medically screening exam initiated at 11:17 PM.  Appropriate orders placed.  Meghan Lynn was informed that the remainder of the evaluation will be completed by another provider, this initial triage assessment does not replace that evaluation, and the importance of remaining in the ED until their evaluation is complete.  Patient's history is highly concerning for unstable angina.  Workup is pending.   Meghan Chroman, PA-C 02/01/24 2326

## 2024-02-02 LAB — TROPONIN I (HIGH SENSITIVITY): Troponin I (High Sensitivity): 4 ng/L (ref <18)

## 2024-02-02 NOTE — Telephone Encounter (Signed)
 Patient did go to the ER on last night but she states it was taken to long. She ended up leaving because she didn't want to catch any of the virus that was going on.  Schedule her for tomorrow with Rueben Cote at 10:05.Please advise

## 2024-02-02 NOTE — ED Notes (Signed)
 Pt stated "id rather die at home than die in the lobby."  Pt exited the ED.
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
## 2024-02-03 ENCOUNTER — Telehealth: Payer: Self-pay | Admitting: Internal Medicine

## 2024-02-03 ENCOUNTER — Telehealth: Payer: Self-pay | Admitting: Pharmacy Technician

## 2024-02-03 ENCOUNTER — Other Ambulatory Visit (HOSPITAL_COMMUNITY): Payer: Self-pay

## 2024-02-03 ENCOUNTER — Encounter: Payer: Self-pay | Admitting: Physician Assistant

## 2024-02-03 ENCOUNTER — Ambulatory Visit: Payer: Medicare HMO | Attending: Physician Assistant | Admitting: Physician Assistant

## 2024-02-03 ENCOUNTER — Other Ambulatory Visit: Payer: Self-pay | Admitting: Physician Assistant

## 2024-02-03 VITALS — BP 122/82 | HR 88 | Resp 16 | Ht 61.0 in | Wt 140.0 lb

## 2024-02-03 DIAGNOSIS — Z8719 Personal history of other diseases of the digestive system: Secondary | ICD-10-CM | POA: Diagnosis not present

## 2024-02-03 DIAGNOSIS — J449 Chronic obstructive pulmonary disease, unspecified: Secondary | ICD-10-CM

## 2024-02-03 DIAGNOSIS — R079 Chest pain, unspecified: Secondary | ICD-10-CM | POA: Diagnosis not present

## 2024-02-03 DIAGNOSIS — E785 Hyperlipidemia, unspecified: Secondary | ICD-10-CM

## 2024-02-03 DIAGNOSIS — I2583 Coronary atherosclerosis due to lipid rich plaque: Secondary | ICD-10-CM

## 2024-02-03 DIAGNOSIS — Z9889 Other specified postprocedural states: Secondary | ICD-10-CM | POA: Diagnosis not present

## 2024-02-03 DIAGNOSIS — I10 Essential (primary) hypertension: Secondary | ICD-10-CM | POA: Diagnosis not present

## 2024-02-03 DIAGNOSIS — Z720 Tobacco use: Secondary | ICD-10-CM

## 2024-02-03 DIAGNOSIS — I2510 Atherosclerotic heart disease of native coronary artery without angina pectoris: Secondary | ICD-10-CM | POA: Diagnosis not present

## 2024-02-03 MED ORDER — NEXLIZET 180-10 MG PO TABS: 1.0000 | ORAL_TABLET | Freq: Every day | ORAL | 3 refills | Status: DC

## 2024-02-03 MED ORDER — RANOLAZINE ER 500 MG PO TB12: 500.0000 mg | ORAL_TABLET | Freq: Two times a day (BID) | ORAL | 3 refills | Status: DC

## 2024-02-03 NOTE — Telephone Encounter (Signed)
 Pharmacy Patient Advocate Encounter   Received notification from Physician's Office that prior authorization for Ranolazine  is required/requested.   Insurance verification completed.   The patient is insured through [employer] .   Per test claim: The current 02/03/24 day co-pay is, $24.37- 3 months .  No PA needed at this time. This test claim was processed through [facility]- copay amounts may vary at other pharmacies due to pharmacy/plan contracts, or as the patient moves through the different stages of their insurance plan.

## 2024-02-03 NOTE — Telephone Encounter (Signed)
  Patient states she cannot afford to pick up the new medications that were prescribed for her today. She wants to know what to do about her ongoing chest pain until she can get the medication. Please advise.

## 2024-02-03 NOTE — Telephone Encounter (Signed)
 Pt c/o medication issue:  1. Name of Medication:   ranolazine  (RANEXA ) 500 MG 12 hr tablet  Bempedoic Acid -Ezetimibe  (NEXLIZET ) 180-10 MG TABS   2. How are you currently taking this medication (dosage and times per day)? Not currently taking  3. Are you having a reaction (difficulty breathing--STAT)? No   4. What is your medication issue? Patient is calling in regards to this med change request. She reports the pharmacy advised her it would cost $378 to pick up these medications which she cannot afford. Please advise.

## 2024-02-03 NOTE — Patient Instructions (Addendum)
 Medication Instructions:  Your physician has recommended you make the following change in your medication:  1) Stop crestor  2) Start Nexlizet  1 tablet daily 3) Start Ranexa  500mg  twice daily  Lab Work: Lipids and LFT--8 weeks est. 03/30/24  If you have labs (blood work) drawn today and your tests are completely normal, you will receive your results only by: MyChart Message (if you have MyChart) OR A paper copy in the mail If you have any lab test that is abnormal or we need to change your treatment, we will call you to review the results.  Testing/Procedures: Your physician has requested that you have a lexiscan  myoview . For further information please visit [email or web address] Please follow instruction sheet, as given.   Follow-Up:  Referral to Gastrointestinal  To be determined after testing  At [facility], you and your health needs are our priority.  As part of our continuing mission to provide you with exceptional heart care, we have created designated Provider Care Teams.  These Care Teams include your primary Cardiologist (physician) and Advanced Practice Providers (APPs -  Physician Assistants and Nurse Practitioners) who all work together to provide you with the care you need, when you need it.  We recommend signing up for the patient portal called MyChart.  Sign up information is provided on this After Visit Summary.  MyChart is used to connect with patients for Virtual Visits (Telemedicine).  Patients are able to view lab/test results, encounter notes, upcoming appointments, etc.  Non-urgent messages can be sent to your provider as well.   To learn more about what you can do with MyChart, go to [email or web address].            Important Information About Sugar

## 2024-02-03 NOTE — Telephone Encounter (Signed)
 Pharmacy Patient Advocate Encounter   Received notification from Physician's Office that prior authorization for Nexlizet  is required/requested.   Insurance verification completed.   The patient is insured through [employer] .   Per test claim: The current 02/03/24 day co-pay is, $309.95- 3 months.  No PA needed at this time. This test claim was processed through [facility]- copay amounts may vary at other pharmacies due to pharmacy/plan contracts, or as the patient moves through the different stages of their insurance plan.

## 2024-02-03 NOTE — Telephone Encounter (Signed)
 Patient identification verified by 2 forms. Shirl Ellen, RN     Called and spoke to patient  Patient states:  - RANEXA  not covered by her insurance per the pharmacy the prescription was sent to. - Still experiencing chest pain during call -patient would like information on switching to a different medication    Patient denies:  - SOB, fatigue, headache, blurred vision, difficulty with speech or movement.              Interventions/Plan: - Explained new deductible policy that some companies have implemented.  - recommended patient call her insurance company to clarify if the $300 is her deductible or if the medication is not covered and that will be an out of pocket cost.  - Reviewed nitroglycerin  use with patient as prescribed and urged patient to seek emergency evaluation if chest pain is not relieved after third dose.  - Patient refused to go to ER and stated I would rather die here then go to the emergency room again.  - will forward to primary cardiologist for further recommendations.    Reviewed ED warning signs/precautions   Patient agrees with plan, no questions at this time

## 2024-02-04 ENCOUNTER — Telehealth: Payer: Self-pay | Admitting: Cardiology

## 2024-02-04 MED ORDER — RANOLAZINE ER 500 MG PO TB12: 500.0000 mg | ORAL_TABLET | Freq: Two times a day (BID) | ORAL | 0 refills | Status: DC

## 2024-02-04 NOTE — Telephone Encounter (Signed)
 Outpatient service line:  Patient called requesting a smaller quantity of the Ranexa  as this is more affordable for her and only wanted a 30-day supply.  Reported that the pharmacy canceled this prescription.  Appears that she has outpatient stress test to further evaluate her chest pain.  Sending message to prescribing provider for further plan if she might qualify for different antianginal or other recommendations.

## 2024-02-05 ENCOUNTER — Telehealth: Payer: Self-pay | Admitting: Cardiology

## 2024-02-05 NOTE — Telephone Encounter (Signed)
 Outpatient service line: Chest pain  Patient calling back reporting that she had worsening chest pain after using the Ranexa  and actually called EMS. She refused to come to the emergency room.  She is reporting worsening and progressive chest pain with radiation but is very adamant about not coming to the emergency room because she waited so long before and had left AMA.  Again I recommended emergency evaluation but she has made it clear she is not to do this.  Sending message to primary provider to see if we can expedite stress test and uptitrate antianginals.  She feels okay though on aspirin  and nitroglycerin  though.

## 2024-02-06 ENCOUNTER — Telehealth (HOSPITAL_COMMUNITY): Payer: Self-pay

## 2024-02-06 ENCOUNTER — Other Ambulatory Visit: Payer: Self-pay

## 2024-02-06 ENCOUNTER — Emergency Department (HOSPITAL_COMMUNITY): Payer: Medicare HMO

## 2024-02-06 ENCOUNTER — Encounter (HOSPITAL_COMMUNITY): Payer: Self-pay | Admitting: *Deleted

## 2024-02-06 ENCOUNTER — Inpatient Hospital Stay (HOSPITAL_COMMUNITY)
Admission: EM | Admit: 2024-02-06 | Discharge: 2024-02-21 | DRG: 233 | Disposition: A | Discharge status: Home / Self Care | Payer: Medicare HMO | Attending: Thoracic Surgery (Cardiothoracic Vascular Surgery) | Admitting: Thoracic Surgery (Cardiothoracic Vascular Surgery)

## 2024-02-06 DIAGNOSIS — Z7982 Long term (current) use of aspirin: Secondary | ICD-10-CM | POA: Diagnosis not present

## 2024-02-06 DIAGNOSIS — J189 Pneumonia, unspecified organism: Secondary | ICD-10-CM | POA: Diagnosis not present

## 2024-02-06 DIAGNOSIS — I110 Hypertensive heart disease with heart failure: Secondary | ICD-10-CM | POA: Diagnosis present

## 2024-02-06 DIAGNOSIS — I255 Ischemic cardiomyopathy: Secondary | ICD-10-CM | POA: Diagnosis present

## 2024-02-06 DIAGNOSIS — D62 Acute posthemorrhagic anemia: Secondary | ICD-10-CM | POA: Diagnosis not present

## 2024-02-06 DIAGNOSIS — Z955 Presence of coronary angioplasty implant and graft: Secondary | ICD-10-CM

## 2024-02-06 DIAGNOSIS — E876 Hypokalemia: Secondary | ICD-10-CM | POA: Diagnosis present

## 2024-02-06 DIAGNOSIS — I5021 Acute systolic (congestive) heart failure: Secondary | ICD-10-CM | POA: Diagnosis not present

## 2024-02-06 DIAGNOSIS — Z884 Allergy status to anesthetic agent status: Secondary | ICD-10-CM

## 2024-02-06 DIAGNOSIS — Z9889 Other specified postprocedural states: Secondary | ICD-10-CM | POA: Diagnosis not present

## 2024-02-06 DIAGNOSIS — N289 Disorder of kidney and ureter, unspecified: Secondary | ICD-10-CM | POA: Diagnosis not present

## 2024-02-06 DIAGNOSIS — I2583 Coronary atherosclerosis due to lipid rich plaque: Secondary | ICD-10-CM | POA: Diagnosis not present

## 2024-02-06 DIAGNOSIS — I5023 Acute on chronic systolic (congestive) heart failure: Secondary | ICD-10-CM | POA: Diagnosis not present

## 2024-02-06 DIAGNOSIS — J439 Emphysema, unspecified: Secondary | ICD-10-CM | POA: Diagnosis not present

## 2024-02-06 DIAGNOSIS — R1030 Lower abdominal pain, unspecified: Secondary | ICD-10-CM | POA: Diagnosis present

## 2024-02-06 DIAGNOSIS — D259 Leiomyoma of uterus, unspecified: Secondary | ICD-10-CM | POA: Diagnosis not present

## 2024-02-06 DIAGNOSIS — Z0389 Encounter for observation for other suspected diseases and conditions ruled out: Secondary | ICD-10-CM | POA: Diagnosis not present

## 2024-02-06 DIAGNOSIS — Z91040 Latex allergy status: Secondary | ICD-10-CM

## 2024-02-06 DIAGNOSIS — J984 Other disorders of lung: Secondary | ICD-10-CM | POA: Diagnosis not present

## 2024-02-06 DIAGNOSIS — Q453 Other congenital malformations of pancreas and pancreatic duct: Secondary | ICD-10-CM

## 2024-02-06 DIAGNOSIS — R54 Age-related physical debility: Secondary | ICD-10-CM | POA: Diagnosis present

## 2024-02-06 DIAGNOSIS — F17210 Nicotine dependence, cigarettes, uncomplicated: Secondary | ICD-10-CM | POA: Diagnosis present

## 2024-02-06 DIAGNOSIS — Z8711 Personal history of peptic ulcer disease: Secondary | ICD-10-CM

## 2024-02-06 DIAGNOSIS — N179 Acute kidney failure, unspecified: Secondary | ICD-10-CM | POA: Diagnosis not present

## 2024-02-06 DIAGNOSIS — I214 Non-ST elevation (NSTEMI) myocardial infarction: Secondary | ICD-10-CM | POA: Diagnosis not present

## 2024-02-06 DIAGNOSIS — R0781 Pleurodynia: Secondary | ICD-10-CM | POA: Diagnosis not present

## 2024-02-06 DIAGNOSIS — Z7902 Long term (current) use of antithrombotics/antiplatelets: Secondary | ICD-10-CM | POA: Diagnosis not present

## 2024-02-06 DIAGNOSIS — I509 Heart failure, unspecified: Secondary | ICD-10-CM | POA: Diagnosis not present

## 2024-02-06 DIAGNOSIS — Z951 Presence of aortocoronary bypass graft: Secondary | ICD-10-CM

## 2024-02-06 DIAGNOSIS — Z452 Encounter for adjustment and management of vascular access device: Secondary | ICD-10-CM | POA: Diagnosis not present

## 2024-02-06 DIAGNOSIS — D751 Secondary polycythemia: Secondary | ICD-10-CM

## 2024-02-06 DIAGNOSIS — D72829 Elevated white blood cell count, unspecified: Secondary | ICD-10-CM | POA: Diagnosis present

## 2024-02-06 DIAGNOSIS — R1310 Dysphagia, unspecified: Secondary | ICD-10-CM | POA: Diagnosis present

## 2024-02-06 DIAGNOSIS — Z4682 Encounter for fitting and adjustment of non-vascular catheter: Secondary | ICD-10-CM | POA: Diagnosis not present

## 2024-02-06 DIAGNOSIS — I700 Atherosclerosis of aorta: Secondary | ICD-10-CM | POA: Diagnosis not present

## 2024-02-06 DIAGNOSIS — I493 Ventricular premature depolarization: Secondary | ICD-10-CM | POA: Diagnosis present

## 2024-02-06 DIAGNOSIS — R079 Chest pain, unspecified: Secondary | ICD-10-CM | POA: Diagnosis not present

## 2024-02-06 DIAGNOSIS — R000 Tachycardia, unspecified: Secondary | ICD-10-CM | POA: Diagnosis present

## 2024-02-06 DIAGNOSIS — R7303 Prediabetes: Secondary | ICD-10-CM | POA: Diagnosis not present

## 2024-02-06 DIAGNOSIS — Z83438 Family history of other disorder of lipoprotein metabolism and other lipidemia: Secondary | ICD-10-CM

## 2024-02-06 DIAGNOSIS — I071 Rheumatic tricuspid insufficiency: Secondary | ICD-10-CM | POA: Diagnosis not present

## 2024-02-06 DIAGNOSIS — T82855A Stenosis of coronary artery stent, initial encounter: Secondary | ICD-10-CM | POA: Diagnosis not present

## 2024-02-06 DIAGNOSIS — D696 Thrombocytopenia, unspecified: Secondary | ICD-10-CM | POA: Diagnosis not present

## 2024-02-06 DIAGNOSIS — R933 Abnormal findings on diagnostic imaging of other parts of digestive tract: Secondary | ICD-10-CM | POA: Diagnosis not present

## 2024-02-06 DIAGNOSIS — Z841 Family history of disorders of kidney and ureter: Secondary | ICD-10-CM

## 2024-02-06 DIAGNOSIS — R0989 Other specified symptoms and signs involving the circulatory and respiratory systems: Secondary | ICD-10-CM | POA: Diagnosis not present

## 2024-02-06 DIAGNOSIS — Z79899 Other long term (current) drug therapy: Secondary | ICD-10-CM

## 2024-02-06 DIAGNOSIS — E785 Hyperlipidemia, unspecified: Secondary | ICD-10-CM | POA: Diagnosis present

## 2024-02-06 DIAGNOSIS — Z888 Allergy status to other drugs, medicaments and biological substances status: Secondary | ICD-10-CM

## 2024-02-06 DIAGNOSIS — J4489 Other specified chronic obstructive pulmonary disease: Secondary | ICD-10-CM | POA: Diagnosis not present

## 2024-02-06 DIAGNOSIS — I2510 Atherosclerotic heart disease of native coronary artery without angina pectoris: Secondary | ICD-10-CM | POA: Diagnosis not present

## 2024-02-06 DIAGNOSIS — N281 Cyst of kidney, acquired: Secondary | ICD-10-CM | POA: Diagnosis present

## 2024-02-06 DIAGNOSIS — R109 Unspecified abdominal pain: Secondary | ICD-10-CM

## 2024-02-06 DIAGNOSIS — I959 Hypotension, unspecified: Secondary | ICD-10-CM | POA: Diagnosis not present

## 2024-02-06 DIAGNOSIS — I517 Cardiomegaly: Secondary | ICD-10-CM | POA: Diagnosis not present

## 2024-02-06 DIAGNOSIS — I119 Hypertensive heart disease without heart failure: Secondary | ICD-10-CM | POA: Diagnosis not present

## 2024-02-06 DIAGNOSIS — K5900 Constipation, unspecified: Secondary | ICD-10-CM | POA: Diagnosis not present

## 2024-02-06 DIAGNOSIS — R339 Retention of urine, unspecified: Secondary | ICD-10-CM | POA: Diagnosis not present

## 2024-02-06 DIAGNOSIS — J449 Chronic obstructive pulmonary disease, unspecified: Secondary | ICD-10-CM | POA: Diagnosis not present

## 2024-02-06 DIAGNOSIS — N200 Calculus of kidney: Secondary | ICD-10-CM | POA: Diagnosis present

## 2024-02-06 DIAGNOSIS — J9811 Atelectasis: Secondary | ICD-10-CM | POA: Diagnosis not present

## 2024-02-06 DIAGNOSIS — I459 Conduction disorder, unspecified: Secondary | ICD-10-CM | POA: Diagnosis present

## 2024-02-06 DIAGNOSIS — Y831 Surgical operation with implant of artificial internal device as the cause of abnormal reaction of the patient, or of later complication, without mention of misadventure at the time of the procedure: Secondary | ICD-10-CM | POA: Diagnosis present

## 2024-02-06 DIAGNOSIS — I10 Essential (primary) hypertension: Secondary | ICD-10-CM | POA: Diagnosis not present

## 2024-02-06 DIAGNOSIS — Z720 Tobacco use: Secondary | ICD-10-CM | POA: Diagnosis not present

## 2024-02-06 DIAGNOSIS — Z833 Family history of diabetes mellitus: Secondary | ICD-10-CM

## 2024-02-06 DIAGNOSIS — I252 Old myocardial infarction: Secondary | ICD-10-CM

## 2024-02-06 DIAGNOSIS — Z48812 Encounter for surgical aftercare following surgery on the circulatory system: Secondary | ICD-10-CM | POA: Diagnosis not present

## 2024-02-06 DIAGNOSIS — Z01810 Encounter for preprocedural cardiovascular examination: Secondary | ICD-10-CM | POA: Diagnosis not present

## 2024-02-06 DIAGNOSIS — K219 Gastro-esophageal reflux disease without esophagitis: Secondary | ICD-10-CM | POA: Diagnosis present

## 2024-02-06 DIAGNOSIS — E7800 Pure hypercholesterolemia, unspecified: Secondary | ICD-10-CM | POA: Diagnosis not present

## 2024-02-06 DIAGNOSIS — E782 Mixed hyperlipidemia: Secondary | ICD-10-CM | POA: Diagnosis not present

## 2024-02-06 DIAGNOSIS — R918 Other nonspecific abnormal finding of lung field: Secondary | ICD-10-CM | POA: Diagnosis not present

## 2024-02-06 DIAGNOSIS — J90 Pleural effusion, not elsewhere classified: Secondary | ICD-10-CM | POA: Diagnosis not present

## 2024-02-06 DIAGNOSIS — Z8249 Family history of ischemic heart disease and other diseases of the circulatory system: Secondary | ICD-10-CM

## 2024-02-06 DIAGNOSIS — R0602 Shortness of breath: Secondary | ICD-10-CM | POA: Diagnosis not present

## 2024-02-06 LAB — CBC WITH DIFFERENTIAL/PLATELET
Abs Immature Granulocytes: 0.07 10*3/uL (ref 0.00–0.07)
Basophils Absolute: 0.1 10*3/uL (ref 0.0–0.1)
Basophils Relative: 1 %
Eosinophils Absolute: 0 10*3/uL (ref 0.0–0.5)
Eosinophils Relative: 0 %
HCT: 53.4 % — ABNORMAL HIGH (ref 36.0–46.0)
Hemoglobin: 17.4 g/dL — ABNORMAL HIGH (ref 12.0–15.0)
Immature Granulocytes: 1 %
Lymphocytes Relative: 13 %
Lymphs Abs: 1.5 10*3/uL (ref 0.7–4.0)
MCH: 25.9 pg — ABNORMAL LOW (ref 26.0–34.0)
MCHC: 32.6 g/dL (ref 30.0–36.0)
MCV: 79.3 fL — ABNORMAL LOW (ref 80.0–100.0)
Monocytes Absolute: 0.4 10*3/uL (ref 0.1–1.0)
Monocytes Relative: 4 %
Neutro Abs: 9.3 10*3/uL — ABNORMAL HIGH (ref 1.7–7.7)
Neutrophils Relative %: 81 %
Platelets: 218 10*3/uL (ref 150–400)
RBC: 6.73 MIL/uL — ABNORMAL HIGH (ref 3.87–5.11)
RDW: 15.1 % (ref 11.5–15.5)
WBC: 11.3 10*3/uL — ABNORMAL HIGH (ref 4.0–10.5)
nRBC: 0 % (ref 0.0–0.2)

## 2024-02-06 LAB — COMPREHENSIVE METABOLIC PANEL
ALT: 13 U/L (ref 0–44)
AST: 19 U/L (ref 15–41)
Albumin: 4 g/dL (ref 3.5–5.0)
Alkaline Phosphatase: 69 U/L (ref 38–126)
Anion gap: 13 (ref 5–15)
BUN: 9 mg/dL (ref 8–23)
CO2: 23 mmol/L (ref 22–32)
Calcium: 9.5 mg/dL (ref 8.9–10.3)
Chloride: 102 mmol/L (ref 98–111)
Creatinine, Ser: 0.73 mg/dL (ref 0.44–1.00)
GFR, Estimated: >60 mL/min (ref >60)
Glucose, Bld: 129 mg/dL — ABNORMAL HIGH (ref 70–99)
Potassium: 3.4 mmol/L — ABNORMAL LOW (ref 3.5–5.1)
Sodium: 138 mmol/L (ref 135–145)
Total Bilirubin: 0.9 mg/dL (ref 0.0–1.2)
Total Protein: 8.2 g/dL — ABNORMAL HIGH (ref 6.5–8.1)

## 2024-02-06 LAB — LIPASE, BLOOD: Lipase: 26 U/L (ref 11–51)

## 2024-02-06 LAB — I-STAT CG4 LACTIC ACID, ED
Lactic Acid, Venous: 4.6 mmol/L (ref 0.5–1.9)
Lactic Acid, Venous: 5.7 mmol/L (ref 0.5–1.9)

## 2024-02-06 LAB — TROPONIN I (HIGH SENSITIVITY)
Troponin I (High Sensitivity): 196 ng/L (ref <18)
Troponin I (High Sensitivity): 181 ng/L (ref <18)

## 2024-02-06 LAB — CBG MONITORING, ED: Glucose-Capillary: 146 mg/dL — ABNORMAL HIGH (ref 70–99)

## 2024-02-06 LAB — HEPARIN LEVEL (UNFRACTIONATED): Heparin Unfractionated: >1.1 [IU]/mL — ABNORMAL HIGH (ref 0.30–0.70)

## 2024-02-06 MED ORDER — LACTATED RINGERS IV BOLUS
2000.0000 mL | Freq: Once | INTRAVENOUS | Status: AC
  Administered 2024-02-07: 2000 mL via INTRAVENOUS

## 2024-02-06 MED ORDER — ISOSORBIDE MONONITRATE ER 30 MG PO TB24: 30.0000 mg | ORAL_TABLET | Freq: Every day | ORAL | 3 refills | Status: DC

## 2024-02-06 MED ORDER — HYDRALAZINE HCL 20 MG/ML IJ SOLN: 10.0000 mg | INTRAMUSCULAR | Status: DC | PRN

## 2024-02-06 MED ORDER — IOHEXOL 300 MG/ML  SOLN
100.0000 mL | Freq: Once | INTRAMUSCULAR | Status: AC | PRN
  Administered 2024-02-06: 100 mL via INTRAVENOUS

## 2024-02-06 MED ORDER — ACETAMINOPHEN 325 MG PO TABS
650.0000 mg | ORAL_TABLET | Freq: Once | ORAL | Status: AC
  Administered 2024-02-06: 650 mg via ORAL
  Filled 2024-02-06: qty 2

## 2024-02-06 MED ORDER — NITROGLYCERIN 0.4 MG SL SUBL
0.4000 mg | SUBLINGUAL_TABLET | Freq: Once | SUBLINGUAL | Status: AC
  Administered 2024-02-06: 0.4 mg via SUBLINGUAL

## 2024-02-06 MED ORDER — IRBESARTAN 150 MG PO TABS
300.0000 mg | ORAL_TABLET | Freq: Every day | ORAL | Status: DC
  Administered 2024-02-06 – 2024-02-07 (×2): 300 mg via ORAL
  Filled 2024-02-06: qty 1
  Filled 2024-02-06: qty 2

## 2024-02-06 MED ORDER — LACTATED RINGERS IV BOLUS
1000.0000 mL | Freq: Once | INTRAVENOUS | Status: AC
Start: 2024-02-06 — End: 2024-02-06
  Administered 2024-02-06: 1000 mL via INTRAVENOUS

## 2024-02-06 MED ORDER — INSULIN ASPART 100 UNIT/ML IJ SOLN
0.0000 [IU] | INTRAMUSCULAR | Status: DC
  Administered 2024-02-07 (×2): 1 [IU] via SUBCUTANEOUS
  Filled 2024-02-06: qty 0.06

## 2024-02-06 MED ORDER — ACETAMINOPHEN 325 MG PO TABS
650.0000 mg | ORAL_TABLET | Freq: Four times a day (QID) | ORAL | Status: DC | PRN
  Administered 2024-02-07 – 2024-02-08 (×2): 650 mg via ORAL
  Filled 2024-02-06 (×2): qty 2

## 2024-02-06 MED ORDER — SODIUM CHLORIDE 0.9% FLUSH
3.0000 mL | Freq: Two times a day (BID) | INTRAVENOUS | Status: DC
  Administered 2024-02-06 – 2024-02-09 (×7): 3 mL via INTRAVENOUS

## 2024-02-06 MED ORDER — METRONIDAZOLE 500 MG/100ML IV SOLN
500.0000 mg | Freq: Two times a day (BID) | INTRAVENOUS | Status: DC
  Administered 2024-02-06 – 2024-02-09 (×6): 500 mg via INTRAVENOUS
  Filled 2024-02-06 (×8): qty 100

## 2024-02-06 MED ORDER — HEPARIN BOLUS VIA INFUSION
3000.0000 [IU] | Freq: Once | INTRAVENOUS | Status: AC
  Administered 2024-02-06: 3000 [IU] via INTRAVENOUS
  Filled 2024-02-06: qty 3000

## 2024-02-06 MED ORDER — ALBUTEROL SULFATE (2.5 MG/3ML) 0.083% IN NEBU
2.5000 mg | INHALATION_SOLUTION | Freq: Four times a day (QID) | RESPIRATORY_TRACT | Status: DC | PRN
  Administered 2024-02-12 – 2024-02-15 (×6): 2.5 mg via RESPIRATORY_TRACT
  Filled 2024-02-06 (×6): qty 3

## 2024-02-06 MED ORDER — POTASSIUM CHLORIDE 10 MEQ/100ML IV SOLN
10.0000 meq | INTRAVENOUS | Status: AC
  Administered 2024-02-06 (×2): 10 meq via INTRAVENOUS
  Filled 2024-02-06 (×2): qty 100

## 2024-02-06 MED ORDER — NITROGLYCERIN 0.4 MG SL SUBL
SUBLINGUAL_TABLET | SUBLINGUAL | Status: AC
  Filled 2024-02-06: qty 1

## 2024-02-06 MED ORDER — RANOLAZINE ER 500 MG PO TB12
500.0000 mg | ORAL_TABLET | Freq: Two times a day (BID) | ORAL | Status: DC
  Administered 2024-02-06 – 2024-02-09 (×6): 500 mg via ORAL
  Filled 2024-02-06 (×8): qty 1

## 2024-02-06 MED ORDER — LABETALOL HCL 5 MG/ML IV SOLN: 20.0000 mg | INTRAVENOUS | Status: DC | PRN

## 2024-02-06 MED ORDER — LACTATED RINGERS IV SOLN: INTRAVENOUS | Status: AC

## 2024-02-06 MED ORDER — ISOSORBIDE MONONITRATE ER 30 MG PO TB24
30.0000 mg | ORAL_TABLET | Freq: Every day | ORAL | Status: DC
  Filled 2024-02-06: qty 1

## 2024-02-06 MED ORDER — FAMOTIDINE IN NACL 20-0.9 MG/50ML-% IV SOLN
20.0000 mg | Freq: Once | INTRAVENOUS | Status: AC
Start: 2024-02-06 — End: 2024-02-06
  Administered 2024-02-06: 20 mg via INTRAVENOUS
  Filled 2024-02-06: qty 50

## 2024-02-06 MED ORDER — LACTATED RINGERS IV BOLUS
1000.0000 mL | Freq: Once | INTRAVENOUS | Status: AC
  Administered 2024-02-06: 1000 mL via INTRAVENOUS

## 2024-02-06 MED ORDER — PANTOPRAZOLE SODIUM 40 MG IV SOLR
40.0000 mg | Freq: Two times a day (BID) | INTRAVENOUS | Status: DC
  Administered 2024-02-06 – 2024-02-09 (×7): 40 mg via INTRAVENOUS
  Filled 2024-02-06 (×7): qty 10

## 2024-02-06 MED ORDER — ACETAMINOPHEN 650 MG RE SUPP: 650.0000 mg | Freq: Four times a day (QID) | RECTAL | Status: DC | PRN

## 2024-02-06 MED ORDER — NITROGLYCERIN 0.4 MG SL SUBL: 0.4000 mg | SUBLINGUAL_TABLET | SUBLINGUAL | Status: DC | PRN

## 2024-02-06 MED ORDER — POLYETHYLENE GLYCOL 3350 17 G PO PACK: 17.0000 g | PACK | Freq: Every day | ORAL | Status: DC | PRN

## 2024-02-06 MED ORDER — CARVEDILOL 12.5 MG PO TABS
12.5000 mg | ORAL_TABLET | Freq: Two times a day (BID) | ORAL | Status: DC
  Administered 2024-02-06 – 2024-02-08 (×3): 12.5 mg via ORAL
  Filled 2024-02-06 (×3): qty 1

## 2024-02-06 MED ORDER — SODIUM CHLORIDE 0.9 % IV SOLN
2.0000 g | INTRAVENOUS | Status: DC
  Administered 2024-02-06 – 2024-02-08 (×3): 2 g via INTRAVENOUS
  Filled 2024-02-06 (×4): qty 20

## 2024-02-06 MED ORDER — ALBUTEROL SULFATE HFA 108 (90 BASE) MCG/ACT IN AERS: 2.0000 | INHALATION_SPRAY | Freq: Four times a day (QID) | RESPIRATORY_TRACT | Status: DC | PRN

## 2024-02-06 MED ORDER — BEMPEDOIC ACID-EZETIMIBE 180-10 MG PO TABS: 1.0000 | ORAL_TABLET | Freq: Every day | ORAL | Status: DC

## 2024-02-06 MED ORDER — HEPARIN (PORCINE) 25000 UT/250ML-% IV SOLN
950.0000 [IU]/h | INTRAVENOUS | Status: DC
  Administered 2024-02-06: 750 [IU]/h via INTRAVENOUS
  Filled 2024-02-06 (×2): qty 250

## 2024-02-06 MED ORDER — NITROGLYCERIN IN D5W 200-5 MCG/ML-% IV SOLN
0.0000 ug/min | INTRAVENOUS | Status: DC
  Administered 2024-02-06: 5 ug/min via INTRAVENOUS
  Administered 2024-02-09: 35 ug/min via INTRAVENOUS
  Filled 2024-02-06 (×2): qty 250

## 2024-02-06 MED ORDER — ASPIRIN 300 MG RE SUPP
300.0000 mg | Freq: Once | RECTAL | Status: AC
  Administered 2024-02-06: 300 mg via RECTAL
  Filled 2024-02-06: qty 1

## 2024-02-06 MED ORDER — AMLODIPINE BESYLATE 5 MG PO TABS
5.0000 mg | ORAL_TABLET | Freq: Every day | ORAL | Status: DC
  Administered 2024-02-06 – 2024-02-07 (×2): 5 mg via ORAL
  Filled 2024-02-06 (×2): qty 1

## 2024-02-06 MED ORDER — UMECLIDINIUM-VILANTEROL 62.5-25 MCG/ACT IN AEPB
1.0000 | INHALATION_SPRAY | Freq: Every day | RESPIRATORY_TRACT | Status: DC
  Administered 2024-02-09 – 2024-02-21 (×11): 1 via RESPIRATORY_TRACT
  Filled 2024-02-06 (×3): qty 14

## 2024-02-06 NOTE — Progress Notes (Signed)
 PHARMACY - ANTICOAGULATION CONSULT NOTE  Pharmacy Consult for heparin   Indication: chest pain/ACS  Allergies  Allergen Reactions   Atorvastatin  Other (See Comments)    Muscle cramping   Propoxyphene Nausea And Vomiting    Darvocet   Latex Itching    Patient Measurements: Weight: 63.5 kg (140 lb) Heparin  Dosing Weight: 63.5 kg   Vital Signs: Temp: 98.8 F (37.1 C) (02/10 1012) Temp Source: Oral (02/10 1012) BP: 136/96 (02/10 1115) Pulse Rate: 108 (02/10 1012)  Labs: Recent Labs    02/06/24 1052  HGB 17.4*  HCT 53.4*  PLT 218  CREATININE 0.73  TROPONINIHS 196*    Estimated Creatinine Clearance: 53.5 mL/min (by C-G formula based on SCr of 0.73 mg/dL).   Medical History: 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.0)    cluster every day   Headache, migraine    daily   Hyperlipidemia    Hypertension    Peptic ulcer disease    Pneumonia    Shortness of breath dyspnea    with exertion    Assessment: 73 yo F with chest pain.  Pharmacy consulted to dose heparin  drip.  No anticoagulants PTA.  Hg 17.4, PLT 218, SCr WNL Trop 196   Goal of Therapy:  Heparin  level 0.3-0.7 units/ml Monitor platelets by anticoagulation protocol: Yes   Plan:  Give 3000 units bolus x 1 Start heparin  infusion at 750 units/hr Check anti-Xa level in 8 hours and daily while on heparin  Continue to monitor H&H and platelets  Rubie Corona, Pharm.D Use secure chat for questions 02/06/2024 12:11 PM

## 2024-02-06 NOTE — H&P (Addendum)
 History and Physical    Patient: Meghan Lynn ZOX:[id number] DOB: 30-Oct-1951 DOA: 02/06/2024 DOS: the patient was seen and examined on 02/06/2024 PCP: Patient, No Pcp Per  Patient coming from: Home  Chief Complaint:  Chief Complaint  Patient presents with   Chest Pain   HPI: Meghan Lynn is a 73 y.o. female with medical history significant of remote coronary artery disease with stenting.  Patient denies having COPD or asthma.  Patient states that he has "chronic bronchitis ".  She also reports having prior esophageal strictures may have required diet patient.  But no dilatation done within the last couple of years.  At baseline patient describes having to drink every more solid food with water or a little fluid.  She does not have any pain with swallowing.  The food just seems to get stuck in her chest.  Patient reports that she was in her usual state of health till 2 weeks ago.  Patient describes that typically she is able to find up a flight of stairs get short of breath at the end of climbing a flight of stairs but not have to pause while doing it.  Similarly she is typically able to mow her lawn at least in winters without pausing.  Although in summary she does get short of breath and has to be on it because.  Patient reports that 2 weeks ago she had a change in terms of both her shortness of breath and a new onset of chest pain.  Patient reports that she started having her sharp substernal chest pain on and off occurring about every hour unrelated to exertion radiating bilaterally under the breast and towards her scapula.  Not related to position or exertion.  Lasting 5 to 10 minutes.  Denies any significant health have been every hour.  Not associated with palpitation or sweating nausea or vomiting.  Associated with this chest pain patient reports worsening sensation of shortness of breath.  Says that just cooking became a chore and she was markedly tachypneic with.  Patient denies any  fever.  Any change in her cough which is chronic with clear scant sputum.  No leg swelling. Not pleuritic or positional pain  Patient actually did not seek attention for her new chest pain/shortness of breath as above including with an ER visit a few days ago at [facility].  However given the wait time since she left patient reports said she had a new baseline till last night when she was abruptly woken up by upper abdominal area pain/aching.  Associated with new cough and a couple of episodes of vomiting.  There was no bloody emesis.  Patient denies any dysuria vaginal discharge flank pain or any diarrhea or any fever.  Patient returned to the [facility] long ER today because of her above symptoms and is noted to be tachycardic, some hyper troponins have come back at 140 or so.  Also CAT scan of the abdomen pelvis as below notable for some free fluid in the upper abdomen - see full ct abd finding below.. Medical eval is sought. Patinet is now tolerating clear liquid diet.  Review of Systems: As mentioned in the history of present illness. All other systems reviewed and are negative. Past Medical History:  Diagnosis Date   Active smoker    Anginal pain (HCC)    Arthritis    Asthma    patient denies   Bronchitis due to tobacco use    Burning sensation of feet  Coronary artery disease    3 stents to RCA   Dilatation of esophagus    Gastritis    GERD (gastroesophageal reflux disease)    Headache(784.0)    cluster every day   Headache, migraine    daily   Hyperlipidemia    Hypertension    Peptic ulcer disease    Pneumonia    Shortness of breath dyspnea    with exertion   Past Surgical History:  Procedure Laterality Date   BACK SURGERY     BALLOON DILATION N/A 05/30/2013   Procedure: BALLOON DILATION;  Surgeon: Barbie Boon, MD;  Location: [facility] ENDOSCOPY;  Service: Endoscopy;  Laterality: N/A;   BALLOON DILATION N/A 08/02/2013   Procedure: BALLOON DILATION;  Surgeon: Barbie Boon, MD;   Location: [facility] ENDOSCOPY;  Service: Endoscopy;  Laterality: N/A;   BALLOON DILATION N/A 04/29/2016   Procedure: BALLOON DILATION;  Surgeon: Delilah Fend, MD;  Location: [facility] ENDOSCOPY;  Service: Endoscopy;  Laterality: N/A;   BALLOON DILATION N/A 06/16/2020   Procedure: BALLOON DILATION;  Surgeon: Genell Ken, MD;  Location: [facility] ENDOSCOPY;  Service: Gastroenterology;  Laterality: N/A;   BIOPSY  06/16/2020   Procedure: BIOPSY;  Surgeon: Genell Ken, MD;  Location: [facility] ENDOSCOPY;  Service: Gastroenterology;;   CARDIAC CATHETERIZATION  03/07/2002   moderte CAD 80% (mid RCA) - Dr. Risa Cheney   CARDIAC CATHETERIZATION  09/13/2003   Cypher 2.5x30mm and 2.5x18 and Taxus 2.75x25mm to distal, mid, prox RCA (Dr. Dina Francisco)   CARDIOLITE MYOCARDIAL PERFUSION STUDY  05/2006   positive bruce protocol, low risk, EF 80%   CARDIOVASCULAR STRESS TEST  2010   COLONOSCOPY     COLONOSCOPY WITH PROPOFOL  N/A 06/16/2020   Procedure: COLONOSCOPY WITH PROPOFOL ;  Surgeon: Genell Ken, MD;  Location: [facility] ENDOSCOPY;  Service: Gastroenterology;  Laterality: N/A;   COLONOSCOPY WITH PROPOFOL  N/A 06/21/2020   Procedure: COLONOSCOPY WITH PROPOFOL ;  Surgeon: Felecia Hopper, MD;  Location: [facility] ENDOSCOPY;  Service: Gastroenterology;  Laterality: N/A;   CORONARY ANGIOPLASTY  2004   stents x 3   DILATATION & CURETTAGE/HYSTEROSCOPY WITH MYOSURE N/A 08/19/2016   Procedure: DILATATION & CURETTAGE/HYSTEROSCOPY WITH MYOSURE;  Surgeon: Ivery Marking, MD;  Location: [facility] ORS;  Service: Gynecology;  Laterality: N/A;   ESOPHAGOGASTRODUODENOSCOPY N/A 05/30/2013   Procedure: ESOPHAGOGASTRODUODENOSCOPY (EGD);  Surgeon: Barbie Boon, MD;  Location: [facility] ENDOSCOPY;  Service: Endoscopy;  Laterality: N/A;   ESOPHAGOGASTRODUODENOSCOPY N/A 08/02/2013   Procedure: ESOPHAGOGASTRODUODENOSCOPY (EGD);  Surgeon: Barbie Boon, MD;  Location: [facility] ENDOSCOPY;  Service: Endoscopy;  Laterality: N/A;  barb /ja   ESOPHAGOGASTRODUODENOSCOPY (EGD) WITH PROPOFOL  N/A 04/29/2016    Procedure: ESOPHAGOGASTRODUODENOSCOPY (EGD) WITH PROPOFOL ;  Surgeon: Delilah Fend, MD;  Location: [facility] ENDOSCOPY;  Service: Endoscopy;  Laterality: N/A;   ESOPHAGOGASTRODUODENOSCOPY (EGD) WITH PROPOFOL  N/A 06/16/2020   Procedure: ESOPHAGOGASTRODUODENOSCOPY (EGD) WITH PROPOFOL ;  Surgeon: Genell Ken, MD;  Location: [facility] ENDOSCOPY;  Service: Gastroenterology;  Laterality: N/A;   FRACTURE SURGERY     HEMOSTASIS CLIP PLACEMENT  06/21/2020   Procedure: HEMOSTASIS CLIP PLACEMENT;  Surgeon: Felecia Hopper, MD;  Location: [facility] ENDOSCOPY;  Service: Gastroenterology;;   ORIF ELBOW FRACTURE  2006   POLYPECTOMY  06/16/2020   Procedure: POLYPECTOMY;  Surgeon: Genell Ken, MD;  Location: [facility] ENDOSCOPY;  Service: Gastroenterology;;   POLYPECTOMY  06/21/2020   Procedure: POLYPECTOMY;  Surgeon: Felecia Hopper, MD;  Location: [facility] ENDOSCOPY;  Service: Gastroenterology;;   TRANSTHORACIC ECHOCARDIOGRAM  07/2008   normal LV function, mild MR, mild TR, trace pulm valve regurg  Social History:  reports that she has been smoking cigarettes. She has a 27 pack-year smoking history. She has never used smokeless tobacco. She reports current alcohol use. She reports that she does not use drugs.  Allergies  Allergen Reactions   Atorvastatin  Other (See Comments)    Muscle cramping   Latex Itching   Propoxyphene Nausea And Vomiting    Darvocet    Family History  Problem Relation Age of Onset   Diabetes Mother    CAD Mother        CABG   Bone cancer Father    Hypertension Father    Heart disease Brother    Kidney disease Sister    Cancer Sister    Diabetes Sister    Hyperlipidemia Sister    Hypertension Sister     Prior to Admission medications   Medication Sig Start Date End Date Taking? Authorizing Provider  albuterol  (VENTOLIN  HFA) 108 (90 Base) MCG/ACT inhaler Inhale 2 puffs into the lungs every 6 (six) hours as needed for wheezing or shortness of breath. 05/11/23  Yes Hunsucker, Archer Kobs, MD  amLODipine   (NORVASC ) 5 MG tablet TAKE 1 TABLET (5 MG TOTAL) BY MOUTH DAILY. 09/29/23  Yes Hilty, Aviva Lemmings, MD  Bempedoic Acid -Ezetimibe  (NEXLIZET ) 180-10 MG TABS Take 1 tablet by mouth at bedtime. 02/03/24  Yes Von Grumbling, PA-C  Biotin 1 MG CAPS Take 1 mg by mouth daily.    Yes [provider]  carvedilol  (COREG ) 12.5 MG tablet TAKE 1 TABLET (12.5 MG TOTAL) BY MOUTH 2 (TWO) TIMES DAILY WITH A MEAL. KEEP OV. 04/27/23  Yes Hilty, Aviva Lemmings, MD  isosorbide  mononitrate (IMDUR ) 30 MG 24 hr tablet Take 1 tablet (30 mg total) by mouth daily. 02/06/24  Yes Hilty, Aviva Lemmings, MD  Multiple Vitamin (MULTIVITAMIN WITH MINERALS) TABS Take 1 tablet by mouth daily.   Yes [provider]  nitroGLYCERIN  (NITROSTAT ) 0.4 MG SL tablet Place 1 tablet (0.4 mg total) under the tongue every 5 (five) minutes as needed for chest pain. 04/26/23 02/06/24 Yes Wittenborn, Deborah, NP  omeprazole (PRILOSEC) 20 MG capsule Take 20 mg by mouth daily.   Yes [provider]  ranolazine  (RANEXA ) 500 MG 12 hr tablet Take 1 tablet (500 mg total) by mouth 2 (two) times daily. 02/04/24  Yes Morgan Arab L, PA-C  umeclidinium-vilanterol (ANORO ELLIPTA ) 62.5-25 MCG/ACT AEPB Inhale 1 puff into the lungs daily. 05/11/23  Yes Hunsucker, Archer Kobs, MD  valsartan  (DIOVAN ) 320 MG tablet TAKE 1 TABLET (320 MG TOTAL) BY MOUTH DAILY. KEEP OV. 05/09/23  Yes Hazle Lites, MD    Physical Exam: Vitals:   02/06/24 1230 02/06/24 1300 02/06/24 1400 02/06/24 1415  BP: (!) 205/148 (!) 154/103 (!) 149/84   Pulse:  92 90   Resp: (!) 26 20 19    Temp:    98.9 F (37.2 C)  TempSrc:    Oral  SpO2:  96% 97%   Weight:       General: Patient is alert and awake, with her daughter and granddaughter pretty good mood.  Symptoms are currently reduced. Respiratory exam: Bilateral intravesicular Abdomen all quadrants are soft, bowel sounds are present epigastric tenderness without guarding or rebound Extremities warm without edema neurologically  patient is alert awake oriented x 3 no focal motor No rash. Data Reviewed:  Labs on Admission:  Results for orders placed or performed during the hospital encounter of 02/06/24 (from the past 24 hours)  Troponin I (High Sensitivity)  Status: Abnormal   Collection Time: 02/06/24 10:52 AM  Result Value Ref Range   Troponin I (High Sensitivity) 196 (HH) <18 ng/L  Comprehensive metabolic panel     Status: Abnormal   Collection Time: 02/06/24 10:52 AM  Result Value Ref Range   Sodium 138 135 - 145 mmol/L   Potassium 3.4 (L) 3.5 - 5.1 mmol/L   Chloride 102 98 - 111 mmol/L   CO2 23 22 - 32 mmol/L   Glucose, Bld 129 (H) 70 - 99 mg/dL   BUN 9 8 - 23 mg/dL   Creatinine, Ser 5.78 0.44 - 1.00 mg/dL   Calcium  9.5 8.9 - 10.3 mg/dL   Total Protein 8.2 (H) 6.5 - 8.1 g/dL   Albumin  4.0 3.5 - 5.0 g/dL   AST 19 15 - 41 U/L   ALT 13 0 - 44 U/L   Alkaline Phosphatase 69 38 - 126 U/L   Total Bilirubin 0.9 0.0 - 1.2 mg/dL   GFR, Estimated >46 >96 mL/min   Anion gap 13 5 - 15  Lipase, blood     Status: None   Collection Time: 02/06/24 10:52 AM  Result Value Ref Range   Lipase 26 11 - 51 U/L  CBC with Differential     Status: Abnormal   Collection Time: 02/06/24 10:52 AM  Result Value Ref Range   WBC 11.3 (H) 4.0 - 10.5 K/uL   RBC 6.73 (H) 3.87 - 5.11 MIL/uL   Hemoglobin 17.4 (H) 12.0 - 15.0 g/dL   HCT 29.5 (H) 28.4 - 13.2 %   MCV 79.3 (L) 80.0 - 100.0 fL   MCH 25.9 (L) 26.0 - 34.0 pg   MCHC 32.6 30.0 - 36.0 g/dL   RDW 44.0 10.2 - 72.5 %   Platelets 218 150 - 400 K/uL   nRBC 0.0 0.0 - 0.2 %   Neutrophils Relative % 81 %   Neutro Abs 9.3 (H) 1.7 - 7.7 K/uL   Lymphocytes Relative 13 %   Lymphs Abs 1.5 0.7 - 4.0 K/uL   Monocytes Relative 4 %   Monocytes Absolute 0.4 0.1 - 1.0 K/uL   Eosinophils Relative 0 %   Eosinophils Absolute 0.0 0.0 - 0.5 K/uL   Basophils Relative 1 %   Basophils Absolute 0.1 0.0 - 0.1 K/uL   Immature Granulocytes 1 %   Abs Immature Granulocytes 0.07 0.00 - 0.07  K/uL  Troponin I (High Sensitivity)     Status: Abnormal   Collection Time: 02/06/24 12:59 PM  Result Value Ref Range   Troponin I (High Sensitivity) 181 (HH) <18 ng/L   Basic Metabolic Panel: Recent Labs  Lab 02/01/24 2259 02/06/24 1052  NA 141 138  K 3.8 3.4*  CL 106 102  CO2 23 23  GLUCOSE 110* 129*  BUN 8 9  CREATININE 0.95 0.73  CALCIUM  9.5 9.5   Liver Function Tests: Recent Labs  Lab 02/06/24 1052  AST 19  ALT 13  ALKPHOS 69  BILITOT 0.9  PROT 8.2*  ALBUMIN  4.0   Recent Labs  Lab 02/06/24 1052  LIPASE 26   No results for input(s): "AMMONIA" in the last 168 hours. CBC: Recent Labs  Lab 02/01/24 2259 02/06/24 1052  WBC 8.6 11.3*  NEUTROABS  --  9.3*  HGB 17.3* 17.4*  HCT 54.4* 53.4*  MCV 81.1 79.3*  PLT 230 218   Cardiac Enzymes: Recent Labs  Lab 02/01/24 2259 02/02/24 0057 02/06/24 1052 02/06/24 1259  TROPONINIHS 4 4 196* 181*  BNP (last 3 results) No results for input(s): "PROBNP" in the last 8760 hours. CBG: No results for input(s): "GLUCAP" in the last 168 hours.  Radiological Exams on Admission:  CT ABDOMEN PELVIS W CONTRAST Result Date: 02/06/2024 CLINICAL DATA:  Left lower quadrant abdominal pain over the last 2 weeks. Chest pain. Shortness of breath. Cough. EXAM: CT ABDOMEN AND PELVIS WITH CONTRAST TECHNIQUE: Multidetector CT imaging of the abdomen and pelvis was performed using the standard protocol following bolus administration of intravenous contrast. RADIATION DOSE REDUCTION: This exam was performed according to the departmental dose-optimization program which includes automated exposure control, adjustment of the mA and/or kV according to patient size and/or use of iterative reconstruction technique. CONTRAST:  OMNIPAQUE  IOHEXOL  300 MG/ML  SOLN COMPARISON:  04/28/2020 CT scan FINDINGS: Lower chest: Left anterior descending, right, and circumflex coronary atherosclerosis along with descending thoracic aortic atherosclerosis.  Centrilobular emphysema. Hepatobiliary: Unremarkable Pancreas: Abnormal edema and fluid signal in the pancreaticoduodenal groove and along the descending duodenum, possibilities may include groove pancreatitis or peptic ulcer disease. Pancreas divisum. Edema tracks along the inferior margin of the pancreatic head and uncinate process as well. Spleen: Unremarkable Adrenals/Urinary Tract: The adrenal glands appear normal. 1.9 by 2.6 by 2.4 cm left kidney lower pole hypodense lesion noted, assessment complicated by streak artifact from spinal hardware, suspected internal septations in this lesion. Renal mass not excluded, and dedicated renal protocol MRI of the abdomen (or alternatively, CT) is recommended in the nonacute setting to exclude malignancy. Other small hypodense renal lesions are likely cysts although some are technically too small to characterize. Punctate nonobstructive bilateral renal calculi are observed. Stomach/Bowel: As noted above, there is edema tracking around the descending and proximal transverse duodenum which could be from bowel inflammation or groove pancreatitis. Vascular/Lymphatic: Atherosclerosis is present, including aortoiliac atherosclerotic disease. There is atheromatous plaque dorsally at the origin of the celiac trunk. Edema associated with the pancreas and duodenum appears to track in the retroperitoneum down along the IVC and somewhat indistinct right ovarian vein. The ovarian vein appears to opacify proximally although is in an early contrast phase; the distal ovarian vein is indeterminate for patency. Reproductive: Anterior uterine body fibroid about 2.1 cm in diameter. Other: No supplemental non-categorized findings. Musculoskeletal: Posterolateral rod and pedicle screw fixator at L3-4 with mild grade 1 anterolisthesis at L3-4 and interbody spacers at this level. Suspected central narrowing of the thecal sac at L4-5 due to disc bulge. IMPRESSION: 1. Abnormal edema and fluid  signal in the pancreaticoduodenal groove and along the descending duodenum, possibilities may include groove pancreatitis or peptic ulcer disease. 2. Pancreas divisum. 3. Edema tracks in the retroperitoneum down along the IVC and along the somewhat indistinct right ovarian vein. The ovarian vein appears to opacify proximally although is in an early contrast phase; the distal ovarian vein is indeterminate for patency. No obvious enlargement or specific abnormality along the right adnexa/ovary observed. 4. 1.9 by 2.6 by 2.4 cm left kidney lower pole hypodense lesion, assessment complicated by streak artifact from spinal hardware, suspected internal septations in this lesion. Renal mass not excluded, and dedicated renal protocol MRI of the abdomen (or alternatively, CT) is recommended in the nonacute setting to exclude malignancy. 5. Punctate nonobstructive bilateral renal calculi. 6. Anterior uterine body fibroid about 2.1 cm in diameter. 7. Posterolateral rod and pedicle screw fixator at L3-4 with mild grade 1 anterolisthesis at L3-4 and interbody spacers at this level. Suspected central narrowing of the thecal sac at L4-5 due to disc  bulge. Aortic Atherosclerosis (ICD10-I70.0) and Emphysema (ICD10-J43.9). Electronically Signed   By: Freida Jes M.D.   On: 02/06/2024 15:12   DG Chest 2 View Result Date: 02/06/2024 CLINICAL DATA:  Chest pain. EXAM: CHEST - 2 VIEW COMPARISON:  Chest radiograph dated February 01, 2024. FINDINGS: Stable cardiomediastinal silhouette. Aortic atherosclerosis. Emphysematous changes are again noted. No focal consolidation, pleural effusion, or pneumothorax. No acute osseous abnormality. IMPRESSION: No acute cardiopulmonary findings. Electronically Signed   By: Mannie Seek M.D.   On: 02/06/2024 11:21    chest X-ray  EKG: Independently reviewed. Sinus tachycardia.  No intake/output data recorded. No intake/output data recorded.      Assessment and Plan: * NSTEMI  (non-ST elevated myocardial infarction) (HCC) EKG shows normal sinus tachycardia troponin patient started on heparin  which will be continued.  The patient's presenting symptom for her chest pain were not typical of acute coronary syndrome therefore we will c.w. investigatin other causes of chest pain, see below. Cardio engaged by [employer]. Transfer to [facility]. Ordred aspirin  300 mg rectal once.  Abdominal pain Please see CT abdominal report as above in detail.  I will quote in part:  abnormal edema and fluid signal in the pancreaticoduodenal groove and along the descending duodenum... Edema tracks in the retroperitoneum down along the IVC and along the somewhat indistinct right ovarian vein.Edema tracks in the retroperitoneum down along the IVC and along the somewhat indistinct right ovarian vein.  Given the abrupt onset of abdominal pain for the patient yesterday/last night my concern is a microperforation of some sort including possible esophageal versus peptic ulcer disease.  I will treat patient empirically with pantoprazole , ceftriaxoen, metronidazole , contrast esophagogram I will make the patient n.p.o. at this time.  Renal cyst will need routine evaluation low threshold for pursuing an MRI abd and surgical consultation.  Erythrocytosis Check erythropoietin .  Patient does use tobaco advised to quit. Hemoconcentration may be contributing - will give fluids.   Home med rec done :   Continue imdur  C.w. ranexa  Continue nitroglycerin  as needed  continue with bempedoic acid  and ezetimibe  tablet C.w albuterol  C.w. anoro ellipta  C.w avapro  C.w. coreg  C.w. norvasc  Hold MVI Omeprazoel changed topantop as above. Hold biotin.  Patinet curently npo. Therefore ordered for PRN anti HTN agents. Lipid panel is pending.  Advance Care Planning:   Code Status: Full Code   Consults: cardio engaged by ER provider. I have paged Dr. Veronda Goody. But ptient has trasnfered care to [facility]. Therefore, paged  Dr. Cherryl Corona - his team (Dr. Bridgett Camps) will eval. However, patient never completed the transfer process. So there may be more discussion about this in AM  Family Communication: daughter at bedside, all questions answered.  Severity of Illness: The appropriate patient status for this patient is INPATIENT. Inpatient status is judged to be reasonable and necessary in order to provide the required intensity of service to ensure the patient's safety. The patient's presenting symptoms, physical exam findings, and initial radiographic and laboratory data in the context of their chronic comorbidities is felt to place them at high risk for further clinical deterioration. Furthermore, it is not anticipated that the patient will be medically stable for discharge from the hospital within 2 midnights of admission.   * I certify that at the point of admission it is my clinical judgment that the patient will require inpatient hospital care spanning beyond 2 midnights from the point of admission due to high intensity of service, high risk for further deterioration and high frequency of  surveillance required.*  Author: Bennie Brave, MD 02/06/2024 5:28 PM  For on call review [email or web address].

## 2024-02-06 NOTE — ED Provider Notes (Signed)
 Patient with an NSTEMI and nonspecific abdominal discomfort.  I spoke with the hospitalist and they will attempt to admit her over at [facility] and having cardiology consult over there   Cheyenne Cotta, MD 02/06/24 1550

## 2024-02-06 NOTE — Telephone Encounter (Signed)
 Meghan Lites, MD  Zeb Heys, RN3 days ago    Stop Ranexa  - start imdur  30 mg daily. That should be cost effective and beneficial for her chest pain  Dr Maximo Spar

## 2024-02-06 NOTE — Telephone Encounter (Signed)
 Patient went to ED

## 2024-02-06 NOTE — Telephone Encounter (Signed)
 Spoke with the patient, she is at [facility] to be seen. She is having chest pain. I told her that I would call her back later in the week to see is she was going to come for her test. S.Laiklynn Raczynski CCT

## 2024-02-06 NOTE — ED Provider Notes (Signed)
 [place] EMERGENCY DEPARTMENT AT [facility] Provider Note   CSN: [id number] Arrival date & time: 02/06/24  1006     History  Chief Complaint  Patient presents with   Chest Pain    Meghan Lynn is a 73 y.o. female.   Chest Pain Patient presents with chest pain and abdominal pain.  Has had chest pain for around 2 weeks.  Had gone to ER and had blood work and x-ray done but did not stay for evaluation.  Patient states she was there for 18 hours.  Then followed up with cardiology.  They started on Ranexa  which she was not able to afford.  Then started on Imdur  which she states she was not able to take due to developing chest pain.  States limited activity due to the chest pain.  States it will come on and not go away unless she takes nitroglycerin .  Not associated with eating.  Also has had abdominal pain.  States it is a separate pain from her chest and is not going the same time.  States still having bowel movements.  No dysuria.       Home Medications Prior to Admission medications   Medication Sig Start Date End Date Taking? Authorizing Provider  albuterol  (VENTOLIN  HFA) 108 (90 Base) MCG/ACT inhaler Inhale 2 puffs into the lungs every 6 (six) hours as needed for wheezing or shortness of breath. 05/11/23  Yes Hunsucker, Archer Kobs, MD  amLODipine  (NORVASC ) 5 MG tablet TAKE 1 TABLET (5 MG TOTAL) BY MOUTH DAILY. 09/29/23  Yes Hilty, Aviva Lemmings, MD  Bempedoic Acid -Ezetimibe  (NEXLIZET ) 180-10 MG TABS Take 1 tablet by mouth at bedtime. 02/03/24  Yes Von Grumbling, PA-C  Biotin 1 MG CAPS Take 1 mg by mouth daily.    Yes [provider]  carvedilol  (COREG ) 12.5 MG tablet TAKE 1 TABLET (12.5 MG TOTAL) BY MOUTH 2 (TWO) TIMES DAILY WITH A MEAL. KEEP OV. 04/27/23  Yes Hilty, Aviva Lemmings, MD  isosorbide  mononitrate (IMDUR ) 30 MG 24 hr tablet Take 1 tablet (30 mg total) by mouth daily. 02/06/24  Yes Hilty, Aviva Lemmings, MD  Multiple Vitamin (MULTIVITAMIN WITH MINERALS) TABS Take 1  tablet by mouth daily.   Yes [provider]  nitroGLYCERIN  (NITROSTAT ) 0.4 MG SL tablet Place 1 tablet (0.4 mg total) under the tongue every 5 (five) minutes as needed for chest pain. 04/26/23 02/06/24 Yes Wittenborn, Deborah, NP  omeprazole (PRILOSEC) 20 MG capsule Take 20 mg by mouth daily.   Yes [provider]  ranolazine  (RANEXA ) 500 MG 12 hr tablet Take 1 tablet (500 mg total) by mouth 2 (two) times daily. 02/04/24  Yes Morgan Arab L, PA-C  umeclidinium-vilanterol (ANORO ELLIPTA ) 62.5-25 MCG/ACT AEPB Inhale 1 puff into the lungs daily. 05/11/23  Yes Hunsucker, Archer Kobs, MD  valsartan  (DIOVAN ) 320 MG tablet TAKE 1 TABLET (320 MG TOTAL) BY MOUTH DAILY. KEEP OV. 05/09/23  Yes Hilty, Aviva Lemmings, MD      Allergies    Atorvastatin , Latex, and Propoxyphene    Review of Systems   Review of Systems  Cardiovascular:  Positive for chest pain.    Physical Exam Updated Vital Signs BP (!) 149/84   Pulse 90   Temp 98.9 F (37.2 C) (Oral)   Resp 19   Wt 63.5 kg   SpO2 97%   BMI 26.45 kg/m  Physical Exam Vitals and nursing note reviewed.  Cardiovascular:     Rate and Rhythm: Regular rhythm.  Pulmonary:  Breath sounds: No wheezing.  Chest:     Chest wall: No tenderness.  Abdominal:     Tenderness: There is abdominal tenderness.     Comments: Abdominal tenderness no rebound or guarding.  No hernia palpated.  Musculoskeletal:     Right lower leg: No tenderness.     Left lower leg: No tenderness.  Skin:    General: Skin is warm.  Neurological:     Mental Status: She is alert.     ED Results / Procedures / Treatments   Labs (all labs ordered are listed, but only abnormal results are displayed) Labs Reviewed  COMPREHENSIVE METABOLIC PANEL - Abnormal; Notable for the following components:      Result Value   Potassium 3.4 (*)    Glucose, Bld 129 (*)    Total Protein 8.2 (*)    All other components within normal limits  CBC WITH DIFFERENTIAL/PLATELET -  Abnormal; Notable for the following components:   WBC 11.3 (*)    RBC 6.73 (*)    Hemoglobin 17.4 (*)    HCT 53.4 (*)    MCV 79.3 (*)    MCH 25.9 (*)    Neutro Abs 9.3 (*)    All other components within normal limits  TROPONIN I (HIGH SENSITIVITY) - Abnormal; Notable for the following components:   Troponin I (High Sensitivity) 196 (*)    All other components within normal limits  TROPONIN I (HIGH SENSITIVITY) - Abnormal; Notable for the following components:   Troponin I (High Sensitivity) 181 (*)    All other components within normal limits  LIPASE, BLOOD  HEPARIN  LEVEL (UNFRACTIONATED)    EKG EKG Interpretation Date/Time:  Monday February 06 2024 10:12:17 EST Ventricular Rate:  102 PR Interval:  139 QRS Duration:  75 QT Interval:  352 QTC Calculation: 459 R Axis:   64  Text Interpretation: Sinus tachycardia Probable left atrial enlargement Confirmed by Mozell Arias [phone]) on 02/06/2024 10:43:17 AM  Radiology DG Chest 2 View Result Date: 02/06/2024 CLINICAL DATA:  Chest pain. EXAM: CHEST - 2 VIEW COMPARISON:  Chest radiograph dated February 01, 2024. FINDINGS: Stable cardiomediastinal silhouette. Aortic atherosclerosis. Emphysematous changes are again noted. No focal consolidation, pleural effusion, or pneumothorax. No acute osseous abnormality. IMPRESSION: No acute cardiopulmonary findings. Electronically Signed   By: Mannie Seek M.D.   On: 02/06/2024 11:21    Procedures Procedures    Medications Ordered in ED Medications  heparin  ADULT infusion 100 units/mL (25000 units/250mL) (750 Units/hr Intravenous New Bag/Given 02/06/24 1241)  nitroGLYCERIN  50 mg in dextrose  5 % 250 mL (0.2 mg/mL) infusion (5 mcg/min Intravenous New Bag/Given 02/06/24 1248)  iohexol  (OMNIPAQUE ) 300 MG/ML solution 100 mL (100 mLs Intravenous Contrast Given 02/06/24 1214)  heparin  bolus via infusion 3,000 Units (3,000 Units Intravenous Bolus from Bag 02/06/24 1242)  nitroGLYCERIN  (NITROSTAT )  SL tablet 0.4 mg (0.4 mg Sublingual Given 02/06/24 1236)  acetaminophen  (TYLENOL ) tablet 650 mg (650 mg Oral Given 02/06/24 1413)    ED Course/ Medical Decision Making/ A&P                                 Medical Decision Making Amount and/or Complexity of Data Reviewed Labs: ordered. Radiology: ordered.  Risk OTC drugs. Prescription drug management.   Patient with a abdominal pain.  Is had for the last couple weeks.  Still having bowel movements.  Differential diagnose includes cause such as diverticulitis.  Will get  blood work and CT scan to evaluate.  Worse in lower abdomen.  Also chest pain.  Anterior chest.  States it does go to the jaw and arm.  Patient is pain-free now.  EKG reassuring, however troponin is elevated.  With elevated troponin will heparinize.  Pain-free so will not give nitroglycerin  at this time.  Will discuss with cardiology.  Patient is currently at CT scan for her abdominal pain.  Second troponin similar.  I discussed with cardiology who recommends admission to hospital under hospitalist service.  However will likely need [facility].  Requests message if they will be able to admit over there are not.  CT scan still pending.  Care turned over to Dr Bryna Car        Final Clinical Impression(s) / ED Diagnoses Final diagnoses:  NSTEMI (non-ST elevated myocardial infarction) [facility])    Rx / DC Orders ED Discharge Orders     None         Mozell Arias, MD 02/06/24 1457

## 2024-02-06 NOTE — Assessment & Plan Note (Addendum)
 Check erythropoietin .  Patient does use tobaco advised to quit. Hemoconcentration may be contributing - will give fluids.

## 2024-02-06 NOTE — ED Notes (Signed)
 From triage to xray by w/c at this time

## 2024-02-06 NOTE — Assessment & Plan Note (Addendum)
 Please see CT abdominal report as above in detail.  I will quote in part:  abnormal edema and fluid signal in the pancreaticoduodenal groove and along the descending duodenum... Edema tracks in the retroperitoneum down along the IVC and along the somewhat indistinct right ovarian vein.Edema tracks in the retroperitoneum down along the IVC and along the somewhat indistinct right ovarian vein.  Given the abrupt onset of abdominal pain for the patient yesterday/last night my concern is a microperforation of some sort including possible esophageal versus peptic ulcer disease.  I will treat patient empirically with pantoprazole , ceftriaxoen, metronidazole , contrast esophagogram I will make the patient n.p.o. at this time.  Renal cyst will need routine evaluation low threshold for pursuing an MRI abd and surgical consultation.

## 2024-02-06 NOTE — Telephone Encounter (Signed)
 Patient called in and report she woke up with severe stomach pain, dizziness, mild chest pain that last couple minutes 3 hrs ago. Patient reports she took nitroglycerin  3 hrs ago with relief.   Patient denies chest pain at this time. Reports last blood pressure 163/80 heart rate 89. Made patient aware per Dr. Maximo Spar recommends stop Ranexa  and start Imdur  30 mg daily Patient verbalized an understanding.  Offered to make patient and appointment with APP or provider. No appts available at this time. Made patient and  advise patient if symptoms restart to go to Emergency Room, patient verbalized.

## 2024-02-06 NOTE — Telephone Encounter (Signed)
   Pt c/o of Chest Pain: STAT if active CP, including tightness, pressure, jaw pain, radiating pain to shoulder/upper arm/back, CP unrelieved by Nitro. Symptoms reported of SOB, nausea, vomiting, sweating.  1. Are you having CP right now? Not having chest pain, had them earlier- at this time having sever stomach    2. Are you experiencing any other symptoms (ex. SOB, nausea, vomiting, sweating)? Vomiting, shortness of breath and extremely tired   3. Is your CP continuous or coming and going? Comes and goes   4. Have you taken Nitroglycerin ? She has taken more than 6 since Friday   5. How long have you been experiencing CP? 2 weeks - patient says she need something did asap   6. If NO CP at time of call then end call with telling Pt to call back or call 911 if Chest pain returns prior to return call from triage team.

## 2024-02-06 NOTE — ED Triage Notes (Signed)
 Returns for intermittent CP, recurrent, seen recently at [facility] ED for the same, ongoing for 2 weeks, worsened Wednesday night, remains. Fluctuates and intermittent. H/o CAD, took ntg PTA. Endorses CP with radiation, sob, cough, NV, abd pain, and HA. Alert, NAD, calm, interactive, resps e/u, EKG completed on arrival, preoccupied with phone. Rates pain 8/10.

## 2024-02-06 NOTE — Assessment & Plan Note (Addendum)
 EKG shows normal sinus tachycardia troponin patient started on heparin  which will be continued.  The patient's presenting symptom for her chest pain were not typical of acute coronary syndrome therefore we will c.w. investigatin other causes of chest pain, see below. Cardio engaged by [employer]. Transfer to [facility]. Ordred aspirin  300 mg rectal once.

## 2024-02-07 ENCOUNTER — Inpatient Hospital Stay (HOSPITAL_COMMUNITY): Payer: Medicare HMO

## 2024-02-07 ENCOUNTER — Other Ambulatory Visit (HOSPITAL_COMMUNITY): Payer: Medicare HMO

## 2024-02-07 ENCOUNTER — Encounter (HOSPITAL_COMMUNITY): Payer: Self-pay | Admitting: Internal Medicine

## 2024-02-07 DIAGNOSIS — Z720 Tobacco use: Secondary | ICD-10-CM

## 2024-02-07 DIAGNOSIS — I10 Essential (primary) hypertension: Secondary | ICD-10-CM | POA: Diagnosis not present

## 2024-02-07 DIAGNOSIS — I214 Non-ST elevation (NSTEMI) myocardial infarction: Secondary | ICD-10-CM | POA: Diagnosis not present

## 2024-02-07 DIAGNOSIS — R7303 Prediabetes: Secondary | ICD-10-CM | POA: Diagnosis not present

## 2024-02-07 DIAGNOSIS — E782 Mixed hyperlipidemia: Secondary | ICD-10-CM

## 2024-02-07 LAB — CBC
HCT: 49 % — ABNORMAL HIGH (ref 36.0–46.0)
Hemoglobin: 15.7 g/dL — ABNORMAL HIGH (ref 12.0–15.0)
MCH: 25.1 pg — ABNORMAL LOW (ref 26.0–34.0)
MCHC: 32 g/dL (ref 30.0–36.0)
MCV: 78.4 fL — ABNORMAL LOW (ref 80.0–100.0)
Platelets: 222 10*3/uL (ref 150–400)
RBC: 6.25 MIL/uL — ABNORMAL HIGH (ref 3.87–5.11)
RDW: 14 % (ref 11.5–15.5)
WBC: 12.8 10*3/uL — ABNORMAL HIGH (ref 4.0–10.5)
nRBC: 0 % (ref 0.0–0.2)

## 2024-02-07 LAB — BASIC METABOLIC PANEL
Anion gap: 10 (ref 5–15)
BUN: 6 mg/dL — ABNORMAL LOW (ref 8–23)
CO2: 26 mmol/L (ref 22–32)
Calcium: 9.1 mg/dL (ref 8.9–10.3)
Chloride: 101 mmol/L (ref 98–111)
Creatinine, Ser: 0.73 mg/dL (ref 0.44–1.00)
GFR, Estimated: >60 mL/min (ref >60)
Glucose, Bld: 123 mg/dL — ABNORMAL HIGH (ref 70–99)
Potassium: 3.5 mmol/L (ref 3.5–5.1)
Sodium: 137 mmol/L (ref 135–145)

## 2024-02-07 LAB — CBG MONITORING, ED
Glucose-Capillary: 116 mg/dL — ABNORMAL HIGH (ref 70–99)
Glucose-Capillary: 183 mg/dL — ABNORMAL HIGH (ref 70–99)

## 2024-02-07 LAB — LIPID PANEL
Cholesterol: 181 mg/dL (ref 0–200)
HDL: 50 mg/dL (ref >40)
LDL Cholesterol: 104 mg/dL — ABNORMAL HIGH (ref 0–99)
Total CHOL/HDL Ratio: 3.6 {ratio}
Triglycerides: 137 mg/dL (ref <150)
VLDL: 27 mg/dL (ref 0–40)

## 2024-02-07 LAB — HEMOGLOBIN A1C
Hgb A1c MFr Bld: 6.2 % — ABNORMAL HIGH (ref 4.8–5.6)
Mean Plasma Glucose: 131.24 mg/dL

## 2024-02-07 LAB — GLUCOSE, CAPILLARY
Glucose-Capillary: 124 mg/dL — ABNORMAL HIGH (ref 70–99)
Glucose-Capillary: 162 mg/dL — ABNORMAL HIGH (ref 70–99)

## 2024-02-07 LAB — PROTIME-INR
INR: 1.1 (ref 0.8–1.2)
Prothrombin Time: 14.2 s (ref 11.4–15.2)

## 2024-02-07 LAB — TROPONIN I (HIGH SENSITIVITY): Troponin I (High Sensitivity): 762 ng/L (ref <18)

## 2024-02-07 LAB — LACTIC ACID, PLASMA: Lactic Acid, Venous: 1.4 mmol/L (ref 0.5–1.9)

## 2024-02-07 LAB — APTT: aPTT: 77 s — ABNORMAL HIGH (ref 24–36)

## 2024-02-07 LAB — HEPARIN LEVEL (UNFRACTIONATED)
Heparin Unfractionated: 0.1 [IU]/mL — ABNORMAL LOW (ref 0.30–0.70)
Heparin Unfractionated: <0.1 [IU]/mL — ABNORMAL LOW (ref 0.30–0.70)
Heparin Unfractionated: 0.67 [IU]/mL (ref 0.30–0.70)
Heparin Unfractionated: 0.36 [IU]/mL (ref 0.30–0.70)

## 2024-02-07 LAB — I-STAT CG4 LACTIC ACID, ED: Lactic Acid, Venous: 1.8 mmol/L (ref 0.5–1.9)

## 2024-02-07 MED ORDER — IOHEXOL 300 MG/ML  SOLN
100.0000 mL | Freq: Once | INTRAMUSCULAR | Status: AC | PRN
  Administered 2024-02-07: 110 mL via ORAL

## 2024-02-07 MED ORDER — ISOSORBIDE MONONITRATE ER 30 MG PO TB24
60.0000 mg | ORAL_TABLET | Freq: Every day | ORAL | Status: DC
  Administered 2024-02-08: 60 mg via ORAL
  Filled 2024-02-07: qty 1

## 2024-02-07 MED ORDER — EZETIMIBE 10 MG PO TABS
10.0000 mg | ORAL_TABLET | Freq: Every day | ORAL | Status: DC
Start: 2024-02-07 — End: 2024-02-10
  Administered 2024-02-07 – 2024-02-09 (×3): 10 mg via ORAL
  Filled 2024-02-07 (×3): qty 1

## 2024-02-07 MED ORDER — HEPARIN BOLUS VIA INFUSION
2000.0000 [IU] | Freq: Once | INTRAVENOUS | Status: AC
  Administered 2024-02-07: 2000 [IU] via INTRAVENOUS
  Filled 2024-02-07: qty 2000

## 2024-02-07 NOTE — ED Notes (Signed)
[facility] called.

## 2024-02-07 NOTE — Consult Note (Signed)
Referring Provider: Dr. [place] Primary Care Physician:  Patient, No Pcp Per Primary Gastroenterologist:  Dr. Levora Angel  Reason for Consultation:  Dysphagia  HPI: Meghan Lynn is a 73 y.o. female admitted for NSTEMI with 6 months of intermittent solid food dysphagia that occurs while eating and has to drink water to resolve the sensation. Denies any worsening of the dysphagia recently. Denies N/V. History of GERD on PPI. History of pyloric channel stenosis with multiple dilations last on EGD in 2021 (Dr. Marca Ancona) when channel was balloon dilated to 15 mm. No record of esophageal stricture. Intermittent abdominal pain that has worsened since barium swallow with lower abdominal pain now. Barium swallow negative for esophageal stricture. No evidence of esophageal perforation. No aspiration. CT shows edema in the pancreaticoduodenal groove and along the descending duodenum of unclear source. Last colonoscopy June 2021 and history of postpolypectomy bleeding.  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.0)    cluster every day   Headache, migraine    daily   Hyperlipidemia    Hypertension    Peptic ulcer disease    Pneumonia    Shortness of breath dyspnea    with exertion    Past Surgical History:  Procedure Laterality Date   BACK SURGERY     BALLOON DILATION N/A 05/30/2013   Procedure: BALLOON DILATION;  Surgeon: Barrie Folk, MD;  Location: [facility] ENDOSCOPY;  Service: Endoscopy;  Laterality: N/A;   BALLOON DILATION N/A 08/02/2013   Procedure: BALLOON DILATION;  Surgeon: Barrie Folk, MD;  Location: [facility] ENDOSCOPY;  Service: Endoscopy;  Laterality: N/A;   BALLOON DILATION N/A 04/29/2016   Procedure: BALLOON DILATION;   Surgeon: Dorena Cookey, MD;  Location: [facility] ENDOSCOPY;  Service: Endoscopy;  Laterality: N/A;   BALLOON DILATION N/A 06/16/2020   Procedure: BALLOON DILATION;  Surgeon: Kerin Salen, MD;  Location: [facility] ENDOSCOPY;  Service: Gastroenterology;  Laterality: N/A;   BIOPSY  06/16/2020   Procedure: BIOPSY;  Surgeon: Kerin Salen, MD;  Location: [facility] ENDOSCOPY;  Service: Gastroenterology;;   CARDIAC CATHETERIZATION  03/07/2002   moderte CAD 80% (mid RCA) - Dr. Chanda Busing   CARDIAC CATHETERIZATION  09/13/2003   Cypher 2.5x56mm and 2.5x18 and Taxus 2.75x18mm to distal, mid, prox RCA (Dr. Laurell Josephs)   CARDIOLITE MYOCARDIAL PERFUSION STUDY  05/2006   positive bruce protocol, low risk, EF 80%   CARDIOVASCULAR STRESS TEST  2010   COLONOSCOPY     COLONOSCOPY WITH PROPOFOL N/A 06/16/2020   Procedure: COLONOSCOPY WITH PROPOFOL;  Surgeon: Kerin Salen, MD;  Location: [facility] ENDOSCOPY;  Service: Gastroenterology;  Laterality: N/A;   COLONOSCOPY WITH PROPOFOL N/A 06/21/2020   Procedure: COLONOSCOPY WITH PROPOFOL;  Surgeon: Kathi Der, MD;  Location: [facility] ENDOSCOPY;  Service: Gastroenterology;  Laterality: N/A;   CORONARY ANGIOPLASTY  2004   stents x 3   DILATATION & CURETTAGE/HYSTEROSCOPY WITH MYOSURE N/A 08/19/2016   Procedure: DILATATION & CURETTAGE/HYSTEROSCOPY WITH MYOSURE;  Surgeon: Maxie Better, MD;  Location: [facility] ORS;  Service: Gynecology;  Laterality: N/A;   ESOPHAGOGASTRODUODENOSCOPY N/A 05/30/2013   Procedure: ESOPHAGOGASTRODUODENOSCOPY (EGD);  Surgeon: Barrie Folk, MD;  Location:  [facility] ENDOSCOPY;  Service: Endoscopy;  Laterality: N/A;   ESOPHAGOGASTRODUODENOSCOPY N/A 08/02/2013   Procedure: ESOPHAGOGASTRODUODENOSCOPY (EGD);  Surgeon: Barrie Folk, MD;  Location: [facility] ENDOSCOPY;  Service: Endoscopy;  Laterality: N/A;  barb /ja   ESOPHAGOGASTRODUODENOSCOPY (EGD) WITH PROPOFOL N/A 04/29/2016   Procedure: ESOPHAGOGASTRODUODENOSCOPY (EGD) WITH PROPOFOL;  Surgeon: Dorena Cookey, MD;  Location: [facility] ENDOSCOPY;  Service:  Endoscopy;  Laterality: N/A;   ESOPHAGOGASTRODUODENOSCOPY (EGD) WITH PROPOFOL N/A 06/16/2020   Procedure: ESOPHAGOGASTRODUODENOSCOPY (EGD) WITH PROPOFOL;  Surgeon: Kerin Salen, MD;  Location: [facility] ENDOSCOPY;  Service: Gastroenterology;  Laterality: N/A;   FRACTURE SURGERY     HEMOSTASIS CLIP PLACEMENT  06/21/2020   Procedure: HEMOSTASIS CLIP PLACEMENT;  Surgeon: Kathi Der, MD;  Location: [facility] ENDOSCOPY;  Service: Gastroenterology;;   ORIF ELBOW FRACTURE  2006   POLYPECTOMY  06/16/2020   Procedure: POLYPECTOMY;  Surgeon: Kerin Salen, MD;  Location: [facility] ENDOSCOPY;  Service: Gastroenterology;;   POLYPECTOMY  06/21/2020   Procedure: POLYPECTOMY;  Surgeon: Kathi Der, MD;  Location: [facility] ENDOSCOPY;  Service: Gastroenterology;;   TRANSTHORACIC ECHOCARDIOGRAM  07/2008   normal LV function, mild MR, mild TR, trace pulm valve regurg    Prior to Admission medications   Medication Sig Start Date End Date Taking? Authorizing Provider  albuterol (VENTOLIN HFA) 108 (90 Base) MCG/ACT inhaler Inhale 2 puffs into the lungs every 6 (six) hours as needed for wheezing or shortness of breath. 05/11/23  Yes Hunsucker, Lesia Sago, MD  amLODipine (NORVASC) 5 MG tablet TAKE 1 TABLET (5 MG TOTAL) BY MOUTH DAILY. 09/29/23  Yes Hilty, Lisette Abu, MD  Bempedoic Acid-Ezetimibe (NEXLIZET) 180-10 MG TABS Take 1 tablet by mouth at bedtime. 02/03/24  Yes Sharlene Dory, PA-C  Biotin 1 MG CAPS Take 1 mg by mouth daily.    Yes [provider]  carvedilol (COREG) 12.5 MG tablet TAKE 1 TABLET (12.5 MG TOTAL) BY MOUTH 2 (TWO) TIMES DAILY WITH A MEAL. KEEP OV. 04/27/23  Yes Hilty, Lisette Abu, MD  isosorbide mononitrate (IMDUR) 30 MG 24 hr tablet Take 1 tablet (30 mg total) by mouth daily. 02/06/24  Yes Hilty, Lisette Abu, MD  Multiple Vitamin (MULTIVITAMIN WITH MINERALS) TABS Take 1 tablet by mouth daily.   Yes [provider]  nitroGLYCERIN (NITROSTAT) 0.4 MG SL tablet Place 1 tablet (0.4 mg total) under the tongue every  5 (five) minutes as needed for chest pain. 04/26/23 02/06/24 Yes Wittenborn, Deborah, NP  omeprazole (PRILOSEC) 20 MG capsule Take 20 mg by mouth daily.   Yes [provider]  ranolazine (RANEXA) 500 MG 12 hr tablet Take 1 tablet (500 mg total) by mouth 2 (two) times daily. 02/04/24  Yes Yvonna Alanis L, PA-C  umeclidinium-vilanterol (ANORO ELLIPTA) 62.5-25 MCG/ACT AEPB Inhale 1 puff into the lungs daily. 05/11/23  Yes Hunsucker, Lesia Sago, MD  valsartan (DIOVAN) 320 MG tablet TAKE 1 TABLET (320 MG TOTAL) BY MOUTH DAILY. KEEP OV. 05/09/23  Yes Hilty, Lisette Abu, MD    Scheduled Meds:  amLODipine  5 mg Oral QHS   carvedilol  12.5 mg Oral BID WC   ezetimibe  10 mg Oral Daily   insulin aspart  0-6 Units Subcutaneous Q4H   irbesartan  300 mg Oral QHS   isosorbide mononitrate  60 mg Oral Daily   pantoprazole (PROTONIX) IV  40 mg Intravenous Q12H   ranolazine  500 mg Oral BID   sodium chloride flush  3 mL Intravenous Q12H   umeclidinium-vilanterol  1 puff Inhalation Daily   Continuous  Infusions:  cefTRIAXone (ROCEPHIN)  IV 2 g (02/07/24 1709)   heparin 950 Units/hr (02/07/24 0344)   metronidazole Stopped (02/07/24 0850)   nitroGLYCERIN 15 mcg/min (02/06/24 1939)   PRN Meds:.acetaminophen **OR** acetaminophen, albuterol, hydrALAZINE, labetalol, polyethylene glycol  Allergies as of 02/06/2024 - Review Complete 02/06/2024  Allergen Reaction Noted   Atorvastatin Other (See Comments) 06/22/2021   Latex Itching 04/21/2016   Propoxyphene Nausea And Vomiting 04/21/2016    Family History  Problem Relation Age of Onset   Diabetes Mother    CAD Mother        CABG   Bone cancer Father    Hypertension Father    Heart disease Brother    Kidney disease Sister    Cancer Sister    Diabetes Sister    Hyperlipidemia Sister    Hypertension Sister     Social History   Socioeconomic History   Marital status: Widowed    Spouse name: Not on file   Number of children: 3   Years of education:  76   Highest education level: Not on file  Occupational History    Employer: [employer]  Tobacco Use   Smoking status: Every Day    Current packs/day: 0.60    Average packs/day: 0.6 packs/day for 45.0 years (27.0 ttl pk-yrs)    Types: Cigarettes   Smokeless tobacco: Never   Tobacco comments:    Smokes 7 packs a week of cigarettes. 05/11/23 Tay  Vaping Use   Vaping status: Never Used  Substance and Sexual Activity   Alcohol use: Yes    Alcohol/week: 0.0 standard drinks of alcohol    Comment: occ   Drug use: No   Sexual activity: Yes    Birth control/protection: Abstinence  Other Topics Concern   Not on file  Social History Narrative   Epworth Sleepiness scale score: 5   Social Drivers of Corporate investment banker Strain: Not on file  Food Insecurity: No Food Insecurity (02/07/2024)   Hunger Vital Sign    Worried About Running Out of Food in the Last Year: Never true    Ran Out of Food in the Last Year: Never true  Transportation Needs: No Transportation Needs (02/07/2024)   PRAPARE - Administrator, Civil Service (Medical): No    Lack of Transportation (Non-Medical): No  Physical Activity: Not on file  Stress: Not on file  Social Connections: Moderately Isolated (02/07/2024)   Social Connection and Isolation Panel [NHANES]    Frequency of Communication with Friends and Family: More than three times a week    Frequency of Social Gatherings with Friends and Family: Twice a week    Attends Religious Services: More than 4 times per year    Active Member of [employer] or Organizations: No    Attends Banker Meetings: Never    Marital Status: Widowed  Intimate Partner Violence: Not At Risk (02/07/2024)   Humiliation, Afraid, Rape, and Kick questionnaire    Fear of Current or Ex-Partner: No    Emotionally Abused: No    Physically Abused: No    Sexually Abused: No    Review of Systems: All negative except as stated above in HPI.  Physical Exam: Vital  signs: Vitals:   02/07/24 1200 02/07/24 1300  BP: (!) 144/89 (!) 153/85  Pulse: 76 76  Resp: (!) 21 18  Temp:  98.3 F (36.8 C)  SpO2: 95%      General:   lethargic, elderly, well-nourished, no  acute distress, pleasant Head: normocephalic, atraumatic Eyes: anicteric sclera ENT: oropharynx clear Neck: supple, nontender Lungs:  Clear throughout to auscultation.   No wheezes, crackles, or rhonchi. No acute distress. Heart:  Regular rate and rhythm; no murmurs, clicks, rubs,  or gallops. Abdomen: lower abdominal tenderness with guarding, soft, nondistended, +BS  Rectal:  Deferred Ext: no edema  GI:  Lab Results: Recent Labs    02/06/24 1052 02/07/24 0548  WBC 11.3* 12.8*  HGB 17.4* 15.7*  HCT 53.4* 49.0*  PLT 218 222   BMET Recent Labs    02/06/24 1052 02/07/24 0548  NA 138 137  K 3.4* 3.5  CL 102 101  CO2 23 26  GLUCOSE 129* 123*  BUN 9 6*  CREATININE 0.73 0.73  CALCIUM 9.5 9.1   LFT Recent Labs    02/06/24 1052  PROT 8.2*  ALBUMIN 4.0  AST 19  ALT 13  ALKPHOS 69  BILITOT 0.9   PT/INR Recent Labs    02/07/24 0548  LABPROT 14.2  INR 1.1     Studies/Results: DG ESOPHAGUS W SINGLE CM (SOL OR THIN BA) Result Date: 02/07/2024 CLINICAL DATA:  73 year old female with history of esophageal stricture, possible microperforation. Request for esophagram. EXAM: ESOPHAGUS/BARIUM SWALLOW/TABLET STUDY TECHNIQUE: Single contrast examination was performed using 110 cc of Omnipaque 300. This exam was performed by Buzzy Han, PA-C, and was supervised and interpreted by Dr. Reche Dixon. FLUOROSCOPY: Radiation Exposure Index (as provided by the fluoroscopic device): 21.9 mGy Kerma COMPARISON:  None Available. FINDINGS: Swallowing: Appears normal. Trace penetration seen on initial images. No aspiration seen. Esophagus: Normal appearance. No contrast extravasation noted. No persistent narrowing to suggest stricture. Gastroesophageal reflux: None visualized. Ingested 13mm  barium tablet: Passed normally. IMPRESSION: 1. Trace penetration without aspiration. 2.  No esophageal contrast extravasation to suggest perforation. 3.  No esophageal stricture. Procedure performed by Buzzy Han, PA-C. Exam supervised and interpreted by Jeronimo Greaves, M.D. Electronically Signed   By: Jeronimo Greaves M.D.   On: 02/07/2024 16:58   CT ABDOMEN PELVIS W CONTRAST Result Date: 02/06/2024 CLINICAL DATA:  Left lower quadrant abdominal pain over the last 2 weeks. Chest pain. Shortness of breath. Cough. EXAM: CT ABDOMEN AND PELVIS WITH CONTRAST TECHNIQUE: Multidetector CT imaging of the abdomen and pelvis was performed using the standard protocol following bolus administration of intravenous contrast. RADIATION DOSE REDUCTION: This exam was performed according to the departmental dose-optimization program which includes automated exposure control, adjustment of the mA and/or kV according to patient size and/or use of iterative reconstruction technique. CONTRAST:  OMNIPAQUE IOHEXOL 300 MG/ML  SOLN COMPARISON:  04/28/2020 CT scan FINDINGS: Lower chest: Left anterior descending, right, and circumflex coronary atherosclerosis along with descending thoracic aortic atherosclerosis. Centrilobular emphysema. Hepatobiliary: Unremarkable Pancreas: Abnormal edema and fluid signal in the pancreaticoduodenal groove and along the descending duodenum, possibilities may include groove pancreatitis or peptic ulcer disease. Pancreas divisum. Edema tracks along the inferior margin of the pancreatic head and uncinate process as well. Spleen: Unremarkable Adrenals/Urinary Tract: The adrenal glands appear normal. 1.9 by 2.6 by 2.4 cm left kidney lower pole hypodense lesion noted, assessment complicated by streak artifact from spinal hardware, suspected internal septations in this lesion. Renal mass not excluded, and dedicated renal protocol MRI of the abdomen (or alternatively, CT) is recommended in the nonacute setting to  exclude malignancy. Other small hypodense renal lesions are likely cysts although some are technically too small to characterize. Punctate nonobstructive bilateral renal calculi are observed. Stomach/Bowel: As noted above, there  is edema tracking around the descending and proximal transverse duodenum which could be from bowel inflammation or groove pancreatitis. Vascular/Lymphatic: Atherosclerosis is present, including aortoiliac atherosclerotic disease. There is atheromatous plaque dorsally at the origin of the celiac trunk. Edema associated with the pancreas and duodenum appears to track in the retroperitoneum down along the IVC and somewhat indistinct right ovarian vein. The ovarian vein appears to opacify proximally although is in an early contrast phase; the distal ovarian vein is indeterminate for patency. Reproductive: Anterior uterine body fibroid about 2.1 cm in diameter. Other: No supplemental non-categorized findings. Musculoskeletal: Posterolateral rod and pedicle screw fixator at L3-4 with mild grade 1 anterolisthesis at L3-4 and interbody spacers at this level. Suspected central narrowing of the thecal sac at L4-5 due to disc bulge. IMPRESSION: 1. Abnormal edema and fluid signal in the pancreaticoduodenal groove and along the descending duodenum, possibilities may include groove pancreatitis or peptic ulcer disease. 2. Pancreas divisum. 3. Edema tracks in the retroperitoneum down along the IVC and along the somewhat indistinct right ovarian vein. The ovarian vein appears to opacify proximally although is in an early contrast phase; the distal ovarian vein is indeterminate for patency. No obvious enlargement or specific abnormality along the right adnexa/ovary observed. 4. 1.9 by 2.6 by 2.4 cm left kidney lower pole hypodense lesion, assessment complicated by streak artifact from spinal hardware, suspected internal septations in this lesion. Renal mass not excluded, and dedicated renal protocol MRI of  the abdomen (or alternatively, CT) is recommended in the nonacute setting to exclude malignancy. 5. Punctate nonobstructive bilateral renal calculi. 6. Anterior uterine body fibroid about 2.1 cm in diameter. 7. Posterolateral rod and pedicle screw fixator at L3-4 with mild grade 1 anterolisthesis at L3-4 and interbody spacers at this level. Suspected central narrowing of the thecal sac at L4-5 due to disc bulge. Aortic Atherosclerosis (ICD10-I70.0) and Emphysema (ICD10-J43.9). Electronically Signed   By: Gaylyn Rong M.D.   On: 02/06/2024 15:12   DG Chest 2 View Result Date: 02/06/2024 CLINICAL DATA:  Chest pain. EXAM: CHEST - 2 VIEW COMPARISON:  Chest radiograph dated February 01, 2024. FINDINGS: Stable cardiomediastinal silhouette. Aortic atherosclerosis. Emphysematous changes are again noted. No focal consolidation, pleural effusion, or pneumothorax. No acute osseous abnormality. IMPRESSION: No acute cardiopulmonary findings. Electronically Signed   By: Hart Robinsons M.D.   On: 02/06/2024 11:21    Impression/Plan: Solid food dysphagia - present for 6 months and no sign of esophageal stricture on barium swallow. Would manage conservatively and hold off on endoscopy for dysphagia. If her diet cannot be advanced due to worsened abdominal pain or vomiting then may need her pyloric channel dilated again but unless showing obstructive symptoms would hold off on that and proceed with cardiac cath and treatment of NSTEMI. Edema on CT in PD groove and along the descending duodenum non-specific and I do not think warrants an endoscopy this admission. Manage conservatively. Diet per cards in case cardiac cath planned for tomorrow. [facility] will sign off. Call us back if questions.    LOS: 1 day   Shirley Friar  02/07/2024, 5:52 PM  Questions please call [phone]

## 2024-02-07 NOTE — Progress Notes (Signed)
Patient called nurse into room to ask about being able to eat. Pt was informed that she is still npo as we are waiting for the results from the esophagram. Pt stated she has gone almost 3 days now with no food and she is going to call and have a family member bring her food. Pt is aware the concerns regarding why the esophagram was ordered and the potential for having a Left heart Cath and the importance of remaining NPO at this time.

## 2024-02-07 NOTE — Progress Notes (Addendum)
PHARMACY - ANTICOAGULATION CONSULT NOTE  Pharmacy Consult for heparin  Indication: chest pain/ACS  Allergies  Allergen Reactions   Atorvastatin Other (See Comments)    Muscle cramping   Latex Itching   Propoxyphene Nausea And Vomiting    Darvocet    Patient Measurements: Height: 5\' 1"  (154.9 cm) Weight: 62.5 kg (137 lb 12.8 oz) IBW/kg (Calculated) : 47.8 Heparin Dosing Weight: 63.5 kg   Vital Signs: Temp: 98.5 F (36.9 C) (02/11 1956) Temp Source: Oral (02/11 1956) BP: 141/88 (02/11 2207) Pulse Rate: 74 (02/11 1956)  Labs: Recent Labs    02/06/24 1052 02/06/24 1259 02/06/24 2145 02/07/24 0548 02/07/24 0750 02/07/24 1216 02/07/24 1933  HGB 17.4*  --   --  15.7*  --   --   --   HCT 53.4*  --   --  49.0*  --   --   --   PLT 218  --   --  222  --   --   --   APTT  --   --   --  77*  --   --   --   LABPROT  --   --   --  14.2  --   --   --   INR  --   --   --  1.1  --   --   --   HEPARINUNFRC  --   --    < >  --  <0.10* 0.67 0.36  CREATININE 0.73  --   --  0.73  --   --   --   TROPONINIHS 196* 181*  --  762*  --   --   --    < > = values in this interval not displayed.    Estimated Creatinine Clearance: 53.1 mL/min (by C-G formula based on SCr of 0.73 mg/dL).   Medical History: 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.0)    cluster every day   Headache, migraine    daily   Hyperlipidemia    Hypertension    Peptic ulcer disease    Pneumonia    Shortness of breath dyspnea    with exertion    Assessment: 73 yo F with chest pain.  Pharmacy consulted to dose heparin drip.  No anticoagulants PTA.   -heparin level= 0.36, at goal after heparin bolus and infusion increase  to 950 units/hr  Goal of Therapy:  Heparin level 0.3-0.7 units/ml Monitor platelets by anticoagulation protocol: Yes   Plan:  -Continue heparin at 950 units/hr -Heparin level daily wth CBC daily  Reece Leader, Colon Flattery, [facility] Clinical Pharmacist  02/07/2024 10:17 PM   [facility] pharmacy phone numbers are listed on [email or web address]

## 2024-02-07 NOTE — Progress Notes (Signed)
       Overnight   NAME: Meghan Lynn MRN: [id number] DOB : 1951-11-21    Date of Service   02/07/2024   HPI/Events of Note    Notified by Admitting Physician for follow up of pending lab after IVF bolus. Lactic acid now 1.8    Latest Reference Range & Units 02/06/24 22:04 02/07/24 01:09  Lactic Acid, Venous 0.5 - 1.9 mmol/L 5.7 (HH) 1.8  (HH): Data is critically high    Interventions/ Plan    Continue previous Physician orders       Chinita Greenland BSN MSNA MSN ACNPC-AG Acute Care Nurse Practitioner [facility]

## 2024-02-07 NOTE — Progress Notes (Signed)
 PROGRESS NOTE    Meghan Lynn  QIH:[id number] DOB: [birth year] DOA: 02/06/2024 PCP: Patient, No Pcp Per    Brief Narrative:   Meghan Lynn is a 73 y.o. female with past medical history significant for CAD s/p PCI, esophageal strictures requiring dilations, tobacco use disorder, asthma/COPD who presents to [facility] ED on 2/10 with recurrent chest pain.  Ongoing over the last 2 weeks, reported intermittent.  Also endorsing epigastric abdominal pain during this timeframe as well; but different timing than her chest pain.  Episodes lasting 5-10 minutes.  Worse with exertion.  Improved with oral nitroglycerin tablets.  Also complaining of nausea and feeling like food gets stuck in her throat.  Seen by cardiology in the outpatient office 02/03/2024 for chest pain.  She was started on Ranexa and arrange stress Myoview on 02/13/2024. Patient actually did not seek attention for her new chest pain/shortness of breath as above including with an ER visit a few days ago at [facility], however given the wait time since she left.  In the ED, temperature 98.8 F, HR 108, RR 23, BP 148/104, SpO2 98% on room air.  WBC 11.3, hemoglobin 17.4, platelet count 218.  Sodium 138, potassium 3.4, chloride 102, CO2 23, glucose 129, BUN 9, creatinine 0.73.  Lipase 26, AST 19, ALT 13, total bilirubin 0.9.  High sensitive troponin [phone].  Chest x-ray with no acute cardiopulmonary disease process.  CT abdomen/pelvis with contrast with abnormal edema/fluid pancreaticoduodenal groove along descending duodenum consistent with pancreatitis versus peptic ulcer disease, pancreas divisum, edema tracks retroperitoneum down along IVC and indistinct right ovarian vein 1.9 x 2.6 x 2.4 cm left kidney lower pole hypodense lesion complicated by streak Arco artifact from spinal hardware suspected internal septations renal mass not excluded, punctate nonobstructive bilateral renal calculi, anterior uterine body fibroid 2.1 cm,  suspected central narrowing thecal sac L4-5 due to disc bulge, aortic atherosclerosis, emphysema.  TRH consulted for admission for further evaluation management of chest pain, epigastric pain with imaging findings and symptoms concerning for possible microperforation esophagus and NSTEMI.  Assessment & Plan:   NSTEMI Hx CAD s/p PCI Patient presenting with 2-week history of chest pain, worse with exertion.  Improved with nitroglycerin tablets.  History of CAD s/p PCI in the past.  Chest pain is intermittent, lasting 10-15 minutes.  KG with sinus tachycardia, rate 102, no concerning dynamic changes to include ST elevation/depressions or T wave inversions. -- Cardiology following, appreciate assistance -- Hs Troponin [phone] -- Echocardiogram: Pending -- Nitroglycerin drip -- Heparin drip -- Monitor on telemetry -- Per cardiology will need left heart catheterization this admission  Epigastric abdominal pain concerning for peptic ulcer disease versus esophageal microperforation Hx esophageal stricture requiring dilatation Patient reporting epigastric abdominal pain over the last 2 weeks which is different than her chest pain as above.  History of esophageal strictures requiring dilatation in the past.  Reports difficulty swallowing solids feeling like they get "stuck". -- [facility] consulted -- Barium esophagram: Pending --Continue antibiotics with ceftriaxone 2 g IV every 24 hours, metronidazole 5 mg IV every 12 hours -- Continue n.p.o. -- Anticipate likely need of EGD for further evaluation  Essential hypertension -- Amlodipine 5 mg p.o. daily -- Carvedilol 12.5 mg p.o. twice daily -- Imdur 6 mg p.o. daily -- Irbesartan 3 mg p.o. nightly -- Ranexa 5 mg p.o. twice daily  Asthma/COPD -- Anoro Ellipta 1 puff daily -- Albuterol neb every 6 hours as needed wheezing/shortness of breath  HLD --  Zetia 10 mg p.o. daily  Tobacco use disorder Counseled on need for complete  cessation     DVT prophylaxis: SCDs Start: 02/06/24 1552    Code Status: Full Code Family Communication: Updated daughter, granddaughter present at bedside this morning  Disposition Plan:  Level of care: Telemetry Cardiac Status is: Inpatient Remains inpatient appropriate because: IV nitroglycerin/heparin drip, pending further evaluation by specialists    Consultants:  Cardiology [facility] gastroenterology  Procedures:  TTE: Pending  Antimicrobials:  Ceftriaxone 2/10>> Metronidazole 2/10>>   Subjective: Patient seen examined bedside, resting calmly.  Lying in bed.  Family present.  Continues with intermittent chest pain and epigastric pain, currently not present during evaluation.  Discussed with patient and family findings on imaging concerning for peptic ulcer disease versus esophageal stenosis versus microperforation given her symptoms as well as elevated troponin concerning for NSTEMI.  Seen by cardiology and transferring to [facility] for anticipated left heart catheterization.  Consulted GI for further evaluation recommendations, anticipate likely need of EGD.  Pending esophagram.  No other specific questions, concerns or complaints at this time other than wanting to eat something right now.  Discussed with her extensively need results of esophagram before safe to consider oral intake.  Denies headache, no vision changes, no current chest pain, no palpitations, no fever/chills/night sweats, no nausea/vomiting/diarrhea, no focal weakness, no fatigue, no cough/congestion, no paresthesia.  No acute events overnight per nurse staff.  Objective: Vitals:   02/07/24 1038 02/07/24 1100 02/07/24 1200 02/07/24 1300  BP:  (!) 155/133 (!) 144/89 (!) 153/85  Pulse:  84 76 76  Resp:  19 (!) 21 18  Temp: 98.7 F (37.1 C)   98.3 F (36.8 C)  TempSrc: Oral   Oral  SpO2:  98% 95%   Weight:    62.5 kg  Height:    5\' 1"  (1.549 m)    Intake/Output Summary (Last 24 hours) at  02/07/2024 1542 Last data filed at 02/07/2024 1000 Gross per 24 hour  Intake 2320.82 ml  Output --  Net 2320.82 ml   Filed Weights   02/06/24 1023 02/07/24 0810 02/07/24 1300  Weight: 63.5 kg 65.8 kg 62.5 kg    Examination:  Physical Exam: GEN: NAD, alert and oriented x 3, elderly/chronically ill appearance HEENT: NCAT, PERRL, EOMI, sclera clear, MMM PULM: CTAB w/o wheezes/crackles, normal respiratory effort, room air CV: RRR w/o M/G/R GI: abd soft, NTND, NABS, no R/G/M MSK: no peripheral edema, muscle strength globally intact 5/5 bilateral upper/lower extremities NEURO: CN II-XII intact, no focal deficits, sensation to light touch intact PSYCH: normal mood/affect Integumentary: dry/intact, no rashes or wounds    Data Reviewed: I have personally reviewed following labs and imaging studies  CBC: Recent Labs  Lab 02/01/24 2259 02/06/24 1052 02/07/24 0548  WBC 8.6 11.3* 12.8*  NEUTROABS  --  9.3*  --   HGB 17.3* 17.4* 15.7*  HCT 54.4* 53.4* 49.0*  MCV 81.1 79.3* 78.4*  PLT 230 218 222   Basic Metabolic Panel: Recent Labs  Lab 02/01/24 2259 02/06/24 1052 02/07/24 0548  NA 141 138 137  K 3.8 3.4* 3.5  CL 106 102 101  CO2 23 23 26   GLUCOSE 110* 129* 123*  BUN 8 9 6*  CREATININE 0.95 0.73 0.73  CALCIUM 9.5 9.5 9.1   GFR: Estimated Creatinine Clearance: 53.1 mL/min (by C-G formula based on SCr of 0.73 mg/dL). Liver Function Tests: Recent Labs  Lab 02/06/24 1052  AST 19  ALT 13  ALKPHOS 69  BILITOT 0.9  PROT 8.2*  ALBUMIN 4.0   Recent Labs  Lab 02/06/24 1052  LIPASE 26   No results for input(s): "AMMONIA" in the last 168 hours. Coagulation Profile: Recent Labs  Lab 02/07/24 0548  INR 1.1   Cardiac Enzymes: No results for input(s): "CKTOTAL", "CKMB", "CKMBINDEX", "TROPONINI" in the last 168 hours. BNP (last 3 results) No results for input(s): "PROBNP" in the last 8760 hours. HbA1C: Recent Labs    02/07/24 0548  HGBA1C 6.2*   CBG: Recent  Labs  Lab 02/06/24 2136 02/07/24 0040 02/07/24 0600 02/07/24 1445  GLUCAP 146* 183* 116* 124*   Lipid Profile: Recent Labs    02/06/24 0548  CHOL 181  HDL 50  LDLCALC 104*  TRIG 137  CHOLHDL 3.6   Thyroid Function Tests: No results for input(s): "TSH", "T4TOTAL", "FREET4", "T3FREE", "THYROIDAB" in the last 72 hours. Anemia Panel: No results for input(s): "VITAMINB12", "FOLATE", "FERRITIN", "TIBC", "IRON", "RETICCTPCT" in the last 72 hours. Sepsis Labs: Recent Labs  Lab 02/06/24 1930 02/06/24 2204 02/07/24 0109 02/07/24 0558  LATICACIDVEN 4.6* 5.7* 1.8 1.4    Recent Results (from the past 240 hours)  Culture, blood (Routine X 2) w Reflex to ID Panel     Status: None (Preliminary result)   Collection Time: 02/06/24  7:15 PM   Specimen: BLOOD  Result Value Ref Range Status   Specimen Description   Final    BLOOD BLOOD LEFT ARM Performed at [facility], [address] [place], [place] [address]    Special Requests   Final    BOTTLES DRAWN AEROBIC AND ANAEROBIC Blood Culture results may not be optimal due to an inadequate volume of blood received in culture bottles Performed at [facility], [address], [place], [place] [address]    Culture   Final    NO GROWTH < 12 HOURS Performed at [facility], [address] [place], [place] [address]    Report Status PENDING  Incomplete  Culture, blood (Routine X 2) w Reflex to ID Panel     Status: None (Preliminary result)   Collection Time: 02/06/24  7:20 PM   Specimen: BLOOD  Result Value Ref Range Status   Specimen Description   Final    BLOOD BLOOD LEFT ARM Performed at [facility], [address], [place], [place] [address]    Special Requests   Final    BOTTLES DRAWN AEROBIC AND ANAEROBIC Blood Culture results may not be optimal due to an inadequate volume of blood received in culture bottles Performed at [facility], [address], [place], [place] [address]    Culture   Final    NO GROWTH < 12 HOURS Performed at [facility], [address] [place], [place] [address]    Report Status PENDING  Incomplete         Radiology Studies: CT ABDOMEN PELVIS W CONTRAST Result Date: 02/06/2024 CLINICAL DATA:  Left lower quadrant abdominal pain over the last 2 weeks. Chest pain. Shortness of breath. Cough. EXAM: CT ABDOMEN AND PELVIS WITH CONTRAST TECHNIQUE: Multidetector CT imaging of the abdomen and pelvis was performed using the standard protocol following bolus administration of intravenous contrast. RADIATION DOSE REDUCTION: This exam was performed according to the departmental dose-optimization program which includes automated exposure control, adjustment of the mA and/or kV according to patient size and/or use of iterative reconstruction technique. CONTRAST:  OMNIPAQUE IOHEXOL 300 MG/ML  SOLN COMPARISON:  04/28/2020 CT scan FINDINGS: Lower chest: Left anterior descending, right, and circumflex coronary atherosclerosis along with descending thoracic aortic atherosclerosis. Centrilobular emphysema. Hepatobiliary: Unremarkable Pancreas: Abnormal edema and fluid signal in the pancreaticoduodenal groove and along the descending duodenum, possibilities may include groove pancreatitis or peptic ulcer disease. Pancreas divisum. Edema tracks along the inferior margin of the pancreatic head and uncinate process as well. Spleen: Unremarkable Adrenals/Urinary Tract: The adrenal glands appear normal. 1.9 by 2.6 by 2.4 cm left kidney lower pole hypodense lesion noted, assessment complicated by streak artifact from spinal hardware, suspected internal septations in this lesion. Renal mass not excluded, and dedicated renal protocol MRI of the abdomen (or alternatively, CT) is recommended in the nonacute setting to exclude malignancy. Other small hypodense renal lesions are likely cysts although some are technically too small to  characterize. Punctate nonobstructive bilateral renal calculi are observed. Stomach/Bowel: As noted above, there is edema tracking around the descending and proximal transverse duodenum which could be from bowel inflammation or groove pancreatitis. Vascular/Lymphatic: Atherosclerosis is present, including aortoiliac atherosclerotic disease. There is atheromatous plaque dorsally at the origin of the celiac trunk. Edema associated with the pancreas and duodenum appears to track in the retroperitoneum down along the IVC and somewhat indistinct right ovarian vein. The ovarian vein appears to opacify proximally although is in an early contrast phase; the distal ovarian vein is indeterminate for patency. Reproductive: Anterior uterine body fibroid about 2.1 cm in diameter. Other: No supplemental non-categorized findings. Musculoskeletal: Posterolateral rod and pedicle screw fixator at L3-4 with mild grade 1 anterolisthesis at L3-4 and interbody spacers at this level. Suspected central narrowing of the thecal sac at L4-5 due to disc bulge. IMPRESSION: 1. Abnormal edema and fluid signal in the pancreaticoduodenal groove and along the descending duodenum, possibilities may include groove pancreatitis or peptic ulcer disease. 2. Pancreas divisum. 3. Edema tracks in the retroperitoneum down along the IVC and along the somewhat indistinct right ovarian vein. The ovarian vein appears to opacify proximally although is in an early contrast phase; the distal ovarian vein is indeterminate for patency. No obvious enlargement or specific abnormality along the right adnexa/ovary observed. 4. 1.9 by 2.6 by 2.4 cm left kidney lower pole hypodense lesion, assessment complicated by streak artifact from spinal hardware, suspected internal septations in this lesion. Renal mass not excluded, and dedicated renal protocol MRI of the abdomen (or alternatively, CT) is recommended in the nonacute setting to exclude malignancy. 5. Punctate  nonobstructive bilateral renal calculi. 6. Anterior uterine body fibroid about 2.1 cm in diameter. 7. Posterolateral rod and pedicle screw fixator at L3-4 with mild grade 1 anterolisthesis at L3-4 and interbody spacers at this level. Suspected central narrowing of the thecal sac at L4-5 due to disc bulge. Aortic Atherosclerosis (ICD10-I70.0) and Emphysema (ICD10-J43.9). Electronically Signed   By: Gaylyn Rong M.D.   On: 02/06/2024 15:12   DG Chest 2 View Result Date: 02/06/2024 CLINICAL DATA:  Chest pain. EXAM: CHEST - 2 VIEW COMPARISON:  Chest radiograph dated February 01, 2024. FINDINGS: Stable cardiomediastinal silhouette. Aortic atherosclerosis. Emphysematous changes are again noted. No focal consolidation, pleural effusion, or pneumothorax. No acute osseous abnormality. IMPRESSION: No acute cardiopulmonary findings. Electronically Signed   By: Hart Robinsons M.D.   On: 02/06/2024 11:21        Scheduled Meds:  amLODipine  5 mg Oral QHS   carvedilol  12.5 mg Oral BID WC   ezetimibe  10 mg Oral Daily   insulin aspart  0-6  Units Subcutaneous Q4H   irbesartan  300 mg Oral QHS   isosorbide mononitrate  60 mg Oral Daily   pantoprazole (PROTONIX) IV  40 mg Intravenous Q12H   ranolazine  500 mg Oral BID   sodium chloride flush  3 mL Intravenous Q12H   umeclidinium-vilanterol  1 puff Inhalation Daily   Continuous Infusions:  cefTRIAXone (ROCEPHIN)  IV Stopped (02/06/24 2254)   heparin 950 Units/hr (02/07/24 0344)   metronidazole 500 mg (02/07/24 0747)   nitroGLYCERIN 15 mcg/min (02/06/24 1939)     LOS: 1 day    Time spent: 56 minutes spent on chart review, discussion with nursing staff, consultants, updating family and interview/physical exam; more than 50% of that time was spent in counseling and/or coordination of care.    Alvira Philips [place], DO [facility] Available via Epic secure chat 7am-7pm After these hours, please refer to coverage provider listed on  [email or web address] 02/07/2024, 3:42 PM

## 2024-02-07 NOTE — ED Notes (Signed)
Pt advised one of her Ivs came out while she was moving. Patient was cleaned and a new IV was placed.

## 2024-02-07 NOTE — Progress Notes (Signed)
PHARMACY - ANTICOAGULATION CONSULT NOTE  Pharmacy Consult for heparin  Indication: chest pain/ACS  Allergies  Allergen Reactions   Atorvastatin Other (See Comments)    Muscle cramping   Latex Itching   Propoxyphene Nausea And Vomiting    Darvocet    Patient Measurements: Weight: 63.5 kg (140 lb) Heparin Dosing Weight: 63.5 kg   Vital Signs: Temp: 98.6 F (37 C) (02/10 2351) Temp Source: Oral (02/10 2351) BP: 167/97 (02/10 2300) Pulse Rate: 83 (02/10 2300)  Labs: Recent Labs    02/06/24 1052 02/06/24 1259 02/06/24 2145 02/07/24 0037  HGB 17.4*  --   --   --   HCT 53.4*  --   --   --   PLT 218  --   --   --   HEPARINUNFRC  --   --  >1.10* <0.10*  CREATININE 0.73  --   --   --   TROPONINIHS 196* 181*  --   --     Estimated Creatinine Clearance: 53.5 mL/min (by C-G formula based on SCr of 0.73 mg/dL).   Medical History: 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.0)    cluster every day   Headache, migraine    daily   Hyperlipidemia    Hypertension    Peptic ulcer disease    Pneumonia    Shortness of breath dyspnea    with exertion    Assessment: 73 yo F with chest pain.  Pharmacy consulted to dose heparin drip.  No anticoagulants PTA.  Baseline labs- Hg 17.4, PLT 218, SCr WNL, Trop 196  02/07/2024: Initial heparin level <0.1; sub-therapeutic on IV heparin 750 units/hr  HL>1.1 drawn from same site and heparin infusing so inaccurate CBC: Hg, pltc WNL No bleeding or infusion related concerns reported by RN  Goal of Therapy:  Heparin level 0.3-0.7 units/ml Monitor platelets by anticoagulation protocol: Yes   Plan:  Re-bolus IV heparin 2000 units IV x 1 then increase  rate 950 units/hr Recheck heparin level 8 hrs after rate change Daily heparin level & CBC while on heparin  Junita Push, PharmD, BCPS 02/07/2024 1:09 AM

## 2024-02-07 NOTE — Progress Notes (Signed)
PHARMACY - ANTICOAGULATION CONSULT NOTE  Pharmacy Consult for heparin  Indication: chest pain/ACS  Allergies  Allergen Reactions   Atorvastatin Other (See Comments)    Muscle cramping   Latex Itching   Propoxyphene Nausea And Vomiting    Darvocet    Patient Measurements: Height: 5\' 1"  (154.9 cm) Weight: 62.5 kg (137 lb 12.8 oz) IBW/kg (Calculated) : 47.8 Heparin Dosing Weight: 63.5 kg   Vital Signs: Temp: 98.3 F (36.8 C) (02/11 1300) Temp Source: Oral (02/11 1300) BP: 153/85 (02/11 1300) Pulse Rate: 76 (02/11 1300)  Labs: Recent Labs    02/06/24 1052 02/06/24 1259 02/06/24 2145 02/07/24 0115 02/07/24 0548 02/07/24 0750 02/07/24 1216  HGB 17.4*  --   --   --  15.7*  --   --   HCT 53.4*  --   --   --  49.0*  --   --   PLT 218  --   --   --  222  --   --   APTT  --   --   --   --  77*  --   --   LABPROT  --   --   --   --  14.2  --   --   INR  --   --   --   --  1.1  --   --   HEPARINUNFRC  --   --    < > 0.10*  --  <0.10* 0.67  CREATININE 0.73  --   --   --  0.73  --   --   TROPONINIHS 196* 181*  --   --  762*  --   --    < > = values in this interval not displayed.    Estimated Creatinine Clearance: 53.1 mL/min (by C-G formula based on SCr of 0.73 mg/dL).   Medical History: 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.0)    cluster every day   Headache, migraine    daily   Hyperlipidemia    Hypertension    Peptic ulcer disease    Pneumonia    Shortness of breath dyspnea    with exertion    Assessment: 73 yo F with chest pain.  Pharmacy consulted to dose heparin drip.  No anticoagulants PTA.   -heparin level= 0.67 at goal after heparin bolus and infusion increase  to 950 units/hr  Goal of Therapy:  Heparin level 0.3-0.7 units/ml Monitor platelets by anticoagulation protocol: Yes   Plan:  -Continue heparin at 950 units/hr -Heparin level in 8 hours and daily wth CBC daily  Harland German, PharmD Clinical Pharmacist **Pharmacist phone directory can now be found on [email or web address] ([email or web address]).  Listed under [facility].

## 2024-02-07 NOTE — Consult Note (Addendum)
 Cardiology Consultation   Patient ID: Meghan Lynn MRN: [id number]; DOB: 02-May-1951  Admit date: 02/06/2024 Date of Consult: 02/07/2024  PCP:  Patient, No Pcp Per   [facility] Providers Cardiologist:  Chrystie Nose, MD   {    Patient Profile:   Meghan Lynn is a 73 y.o. female with a hx of CAD s/p DES x3 to distal/mid/prox RCA 08/2013, HTN, HLD, tobacco use, asthma/COPD, esophageal stricture,  who is being seen 02/07/2024 for the evaluation of NSTEMI at the request of Dr [place].  History of Present Illness:   Ms. Wilmarth with above PMH who presented to ER yesterday for chest pain. She states she visited ER on Wednesday 02/01/24 for chest pain, had negative trop x2, could not wait for completion of her workup due to prolonged waiting and left.   She saw Jari Favre PA-C on 02/03/24 in the office, c/o 2 weeks of chest pain occurs with exertion and activities. She was started on Ranexa and arranged stress myoview on 02/13/24.   She states she had intermittent mid-sternum to epigastric area pain over the past 2 weeks. She states pain almost occurring daily. Each episode last up to 10 minutes. She did not noticed any specific triggers for the pain, sometimes occurring at rest while she was sleeping.  She reports associated SOB, dizziness, left arm pain with numbness, and left shoulder pain. She felt since Wednesday 02/01/24, her pain is much worse in intensity as well as lasting longer. She had tried Nitroglycerin in the past and it helps relieve the pain sometimes.   She also c.o 2 weeks onset of intermittent abdominal pain, felt this is not occurring at the same time of her chest pain. She had intermittent nausea with emesis. She felt food gets stuck in her throat and need to drink water to wash it down. She felt her abdomen is bigger than usual and more bloated.    Diagnostic from admission so far showed Hs trop 196 >181>762. Lab from 2/10 showed WBC 11300, Hgb 17.4. LDL 137.  K 3.4. lactic acid was 4.6 >5.7 >1.8>1.4. Lab today showed unremarkable BMP. WBC 12800, Hgb 15.7. INR 1.1 A1C 6.2%. CXR from yesterday showed no acute findings. Lipase 26. CTAP showed Abnormal edema and fluid signal in the pancreaticoduodenal groove and along the descending duodenum, possibilities may include groove pancreatitis or peptic ulcer disease.Pancreas divisum. Edema tracks in the retroperitoneum down along the IVC and along the somewhat indistinct right ovarian vein. 1.9 by 2.6 by 2.4 cm left kidney lower pole hypodense lesion (see report for detail). She is admitted to hospitalist, started heparin gtt for NSTEMI, esophagogram planned for GI workup. Cardiology is consulted for further evaluation.    Per chart review, she follows Dr Rennis Golden, LHC 6 03/07/2012 showed Moderate single-vessel CAD mid RCA. She was opted for medical management. LHC 09/12/2013 she was treated with PCI with DES x 3 to distal, mid, proximal RCA. NM stress 10/08/16 was low risk and normal. Most recent Echo is from 08/08/2008 showed normal LV size/systolic function/WM, mild MR and TR. She had c/o claudication in the past, most recent lower extremity doppler study was  from 03/09/21, showed normal ABI bilaterally without arterial disease. She is historically maintained on valsartan, imdur, coreg, amlodipine, Bempedoic Acid-Ezetimibe for HTN/CAD.    Past Medical History:  Diagnosis Date   Active smoker    Anginal pain (HCC)    Arthritis    Asthma    patient denies  Bronchitis due to tobacco use    Burning sensation of feet    Coronary artery disease    3 stents to RCA   Dilatation of esophagus    Gastritis    GERD (gastroesophageal reflux disease)    Headache(784.0)    cluster every day   Headache, migraine    daily   Hyperlipidemia    Hypertension    Peptic ulcer disease    Pneumonia    Shortness of breath dyspnea    with exertion    Past Surgical History:  Procedure Laterality Date   BACK SURGERY      BALLOON DILATION N/A 05/30/2013   Procedure: BALLOON DILATION;  Surgeon: Barrie Folk, MD;  Location: [facility] ENDOSCOPY;  Service: Endoscopy;  Laterality: N/A;   BALLOON DILATION N/A 08/02/2013   Procedure: BALLOON DILATION;  Surgeon: Barrie Folk, MD;  Location: [facility] ENDOSCOPY;  Service: Endoscopy;  Laterality: N/A;   BALLOON DILATION N/A 04/29/2016   Procedure: BALLOON DILATION;  Surgeon: Dorena Cookey, MD;  Location: [facility] ENDOSCOPY;  Service: Endoscopy;  Laterality: N/A;   BALLOON DILATION N/A 06/16/2020   Procedure: BALLOON DILATION;  Surgeon: Kerin Salen, MD;  Location: [facility] ENDOSCOPY;  Service: Gastroenterology;  Laterality: N/A;   BIOPSY  06/16/2020   Procedure: BIOPSY;  Surgeon: Kerin Salen, MD;  Location: [facility] ENDOSCOPY;  Service: Gastroenterology;;   CARDIAC CATHETERIZATION  03/07/2002   moderte CAD 80% (mid RCA) - Dr. Chanda Busing   CARDIAC CATHETERIZATION  09/13/2003   Cypher 2.5x58mm and 2.5x18 and Taxus 2.75x35mm to distal, mid, prox RCA (Dr. Laurell Josephs)   CARDIOLITE MYOCARDIAL PERFUSION STUDY  05/2006   positive bruce protocol, low risk, EF 80%   CARDIOVASCULAR STRESS TEST  2010   COLONOSCOPY     COLONOSCOPY WITH PROPOFOL N/A 06/16/2020   Procedure: COLONOSCOPY WITH PROPOFOL;  Surgeon: Kerin Salen, MD;  Location: [facility] ENDOSCOPY;  Service: Gastroenterology;  Laterality: N/A;   COLONOSCOPY WITH PROPOFOL N/A 06/21/2020   Procedure: COLONOSCOPY WITH PROPOFOL;  Surgeon: Kathi Der, MD;  Location: [facility] ENDOSCOPY;  Service: Gastroenterology;  Laterality: N/A;   CORONARY ANGIOPLASTY  2004   stents x 3   DILATATION & CURETTAGE/HYSTEROSCOPY WITH MYOSURE N/A 08/19/2016   Procedure: DILATATION & CURETTAGE/HYSTEROSCOPY WITH MYOSURE;  Surgeon: Maxie Better, MD;  Location: [facility] ORS;  Service: Gynecology;  Laterality: N/A;   ESOPHAGOGASTRODUODENOSCOPY N/A 05/30/2013   Procedure: ESOPHAGOGASTRODUODENOSCOPY (EGD);  Surgeon: Barrie Folk, MD;  Location: [facility] ENDOSCOPY;  Service: Endoscopy;  Laterality: N/A;    ESOPHAGOGASTRODUODENOSCOPY N/A 08/02/2013   Procedure: ESOPHAGOGASTRODUODENOSCOPY (EGD);  Surgeon: Barrie Folk, MD;  Location: [facility] ENDOSCOPY;  Service: Endoscopy;  Laterality: N/A;  barb /ja   ESOPHAGOGASTRODUODENOSCOPY (EGD) WITH PROPOFOL N/A 04/29/2016   Procedure: ESOPHAGOGASTRODUODENOSCOPY (EGD) WITH PROPOFOL;  Surgeon: Dorena Cookey, MD;  Location: [facility] ENDOSCOPY;  Service: Endoscopy;  Laterality: N/A;   ESOPHAGOGASTRODUODENOSCOPY (EGD) WITH PROPOFOL N/A 06/16/2020   Procedure: ESOPHAGOGASTRODUODENOSCOPY (EGD) WITH PROPOFOL;  Surgeon: Kerin Salen, MD;  Location: [facility] ENDOSCOPY;  Service: Gastroenterology;  Laterality: N/A;   FRACTURE SURGERY     HEMOSTASIS CLIP PLACEMENT  06/21/2020   Procedure: HEMOSTASIS CLIP PLACEMENT;  Surgeon: Kathi Der, MD;  Location: [facility] ENDOSCOPY;  Service: Gastroenterology;;   ORIF ELBOW FRACTURE  2006   POLYPECTOMY  06/16/2020   Procedure: POLYPECTOMY;  Surgeon: Kerin Salen, MD;  Location: [facility] ENDOSCOPY;  Service: Gastroenterology;;   POLYPECTOMY  06/21/2020   Procedure: POLYPECTOMY;  Surgeon: Kathi Der, MD;  Location: [facility] ENDOSCOPY;  Service: Gastroenterology;;   TRANSTHORACIC ECHOCARDIOGRAM  07/2008   normal LV function, mild MR, mild TR, trace pulm valve regurg     Home Medications:  Prior to Admission medications   Medication Sig Start Date End Date Taking? Authorizing Provider  albuterol (VENTOLIN HFA) 108 (90 Base) MCG/ACT inhaler Inhale 2 puffs into the lungs every 6 (six) hours as needed for wheezing or shortness of breath. 05/11/23  Yes Hunsucker, Lesia Sago, MD  amLODipine (NORVASC) 5 MG tablet TAKE 1 TABLET (5 MG TOTAL) BY MOUTH DAILY. 09/29/23  Yes Hilty, Lisette Abu, MD  Bempedoic Acid-Ezetimibe (NEXLIZET) 180-10 MG TABS Take 1 tablet by mouth at bedtime. 02/03/24  Yes Sharlene Dory, PA-C  Biotin 1 MG CAPS Take 1 mg by mouth daily.    Yes [provider]  carvedilol (COREG) 12.5 MG tablet TAKE 1 TABLET (12.5 MG TOTAL) BY MOUTH 2 (TWO) TIMES DAILY  WITH A MEAL. KEEP OV. 04/27/23  Yes Hilty, Lisette Abu, MD  isosorbide mononitrate (IMDUR) 30 MG 24 hr tablet Take 1 tablet (30 mg total) by mouth daily. 02/06/24  Yes Hilty, Lisette Abu, MD  Multiple Vitamin (MULTIVITAMIN WITH MINERALS) TABS Take 1 tablet by mouth daily.   Yes [provider]  nitroGLYCERIN (NITROSTAT) 0.4 MG SL tablet Place 1 tablet (0.4 mg total) under the tongue every 5 (five) minutes as needed for chest pain. 04/26/23 02/06/24 Yes Wittenborn, Deborah, NP  omeprazole (PRILOSEC) 20 MG capsule Take 20 mg by mouth daily.   Yes [provider]  ranolazine (RANEXA) 500 MG 12 hr tablet Take 1 tablet (500 mg total) by mouth 2 (two) times daily. 02/04/24  Yes Yvonna Alanis L, PA-C  umeclidinium-vilanterol (ANORO ELLIPTA) 62.5-25 MCG/ACT AEPB Inhale 1 puff into the lungs daily. 05/11/23  Yes Hunsucker, Lesia Sago, MD  valsartan (DIOVAN) 320 MG tablet TAKE 1 TABLET (320 MG TOTAL) BY MOUTH DAILY. KEEP OV. 05/09/23  Yes Hilty, Lisette Abu, MD    Inpatient Medications: Scheduled Meds:  amLODipine  5 mg Oral QHS   carvedilol  12.5 mg Oral BID WC   ezetimibe  10 mg Oral Daily   insulin aspart  0-6 Units Subcutaneous Q4H   irbesartan  300 mg Oral QHS   isosorbide mononitrate  30 mg Oral Daily   pantoprazole (PROTONIX) IV  40 mg Intravenous Q12H   ranolazine  500 mg Oral BID   sodium chloride flush  3 mL Intravenous Q12H   umeclidinium-vilanterol  1 puff Inhalation Daily   Continuous Infusions:  cefTRIAXone (ROCEPHIN)  IV Stopped (02/06/24 2254)   heparin 950 Units/hr (02/07/24 0344)   metronidazole 500 mg (02/07/24 0747)   nitroGLYCERIN 15 mcg/min (02/06/24 1939)   PRN Meds: acetaminophen **OR** acetaminophen, albuterol, hydrALAZINE, labetalol, polyethylene glycol  Allergies:    Allergies  Allergen Reactions   Atorvastatin Other (See Comments)    Muscle cramping   Latex Itching   Propoxyphene Nausea And Vomiting    Darvocet    Social History:   Social History    Socioeconomic History   Marital status: Widowed    Spouse name: Not on file   Number of children: 3   Years of education: 44   Highest education level: Not on file  Occupational History    Employer: [employer]  Tobacco Use   Smoking status: Every Day    Current packs/day: 0.60    Average packs/day: 0.6 packs/day for 45.0 years (27.0 ttl pk-yrs)    Types: Cigarettes   Smokeless tobacco: Never   Tobacco comments:  Smokes 7 packs a week of cigarettes. 05/11/23 Tay  Vaping Use   Vaping status: Never Used  Substance and Sexual Activity   Alcohol use: Yes    Alcohol/week: 0.0 standard drinks of alcohol    Comment: occ   Drug use: No   Sexual activity: Yes    Birth control/protection: Abstinence  Other Topics Concern   Not on file  Social History Narrative   Epworth Sleepiness scale score: 5   Social Drivers of Corporate investment banker Strain: Not on file  Food Insecurity: Not on file  Transportation Needs: Not on file  Physical Activity: Not on file  Stress: Not on file  Social Connections: Not on file  Intimate Partner Violence: Not on file    Family History:    Family History  Problem Relation Age of Onset   Diabetes Mother    CAD Mother        CABG   Bone cancer Father    Hypertension Father    Heart disease Brother    Kidney disease Sister    Cancer Sister    Diabetes Sister    Hyperlipidemia Sister    Hypertension Sister      ROS:  Constitutional: Denied fever, chills, malaise, night sweats Eyes: Denied vision change or loss Ears/Nose/Mouth/Throat: Denied ear ache, sore throat, coughing, sinus pain Cardiovascular:see HPI  Respiratory: see HPI  Gastrointestinal: see HPI  Genital/Urinary: Denied dysuria, hematuria, urinary frequency/urgency Musculoskeletal: Denied muscle ache, joint pain, weakness Skin: Denied rash, wound Neuro: Denied headache, dizziness, syncope Psych: Denied history of depression/anxiety  Endocrine: history of  diabetes   Physical Exam/Data:   Vitals:   02/07/24 0930 02/07/24 1000 02/07/24 1038 02/07/24 1100  BP: (!) 152/105 (!) 161/93  (!) 155/133  Pulse: 88   84  Resp: (!) 22 (!) 23  19  Temp:   98.7 F (37.1 C)   TempSrc:   Oral   SpO2: 99%   98%  Weight:      Height:        Intake/Output Summary (Last 24 hours) at 02/07/2024 1125 Last data filed at 02/07/2024 1000 Gross per 24 hour  Intake 2320.82 ml  Output --  Net 2320.82 ml      02/07/2024    8:10 AM 02/06/2024   10:23 AM 02/03/2024   10:00 AM  Last 3 Weights  Weight (lbs) 145 lb 140 lb 140 lb  Weight (kg) 65.772 kg 63.504 kg 63.504 kg     Body mass index is 27.4 kg/m.   Vitals:  Vitals:   02/07/24 1038 02/07/24 1100  BP:  (!) 155/133  Pulse:  84  Resp:  19  Temp: 98.7 F (37.1 C)   SpO2:  98%   General Appearance: In no apparent distress, laying in bed HEENT: Normocephalic, atraumatic.  Neck: Supple, trachea midline, no JVDs Cardiovascular: Regular rate and rhythm, normal S1-S2,  no murmur Respiratory: Resting breathing unlabored, lungs sounds clear to auscultation bilaterally, no use of accessory muscles. On room air.  No wheezes, rales or rhonchi.   Gastrointestinal: Bowel sounds positive, abdomen soft, non-distended, generalized tenderness on palpation Extremities: Able to move all extremities in bed without difficulty, no edema of BLE Musculoskeletal: Normal muscle bulk and tone Skin: Intact, warm, dry. No rashes or petechiae noted in exposed areas.  Neurologic: Alert, oriented to person, place and time.  no gross focal neuro deficit Psychiatric: Normal affect. Mood is appropriate.     EKG:  The EKG was personally  reviewed and demonstrates:    EKG from 02/06/24 showed sinus tachycardia 102 bpm, old lateral T wave normalities resolved (presented on 04/26/23)  Telemetry:  Telemetry was personally reviewed and demonstrates:    Sinus rhythm 90s, PVCs , artifacts   Relevant CV Studies:   Stress myoview  10/08/16:  The left ventricular ejection fraction is hyperdynamic (>65%). Nuclear stress EF: 67%. There was no ST segment deviation noted during stress. No T wave inversion was noted during stress. The study is normal.   Low risk stress nuclear study with normal perfusion and normal left ventricular regional and global systolic function.    Echo 08/08/2008 see report  Cath 09/13/2003 see report   Laboratory Data:  High Sensitivity Troponin:   Recent Labs  Lab 02/01/24 2259 02/02/24 0057 02/06/24 1052 02/06/24 1259 02/07/24 0548  TROPONINIHS 4 4 196* 181* 762*     Chemistry Recent Labs  Lab 02/01/24 2259 02/06/24 1052 02/07/24 0548  NA 141 138 137  K 3.8 3.4* 3.5  CL 106 102 101  CO2 23 23 26   GLUCOSE 110* 129* 123*  BUN 8 9 6*  CREATININE 0.95 0.73 0.73  CALCIUM 9.5 9.5 9.1  GFRNONAA >60 >60 >60  ANIONGAP 12 13 10     Recent Labs  Lab 02/06/24 1052  PROT 8.2*  ALBUMIN 4.0  AST 19  ALT 13  ALKPHOS 69  BILITOT 0.9   Lipids  Recent Labs  Lab 02/06/24 0548  CHOL 181  TRIG 137  HDL 50  LDLCALC 104*  CHOLHDL 3.6    Hematology Recent Labs  Lab 02/01/24 2259 02/06/24 1052 02/07/24 0548  WBC 8.6 11.3* 12.8*  RBC 6.71* 6.73* 6.25*  HGB 17.3* 17.4* 15.7*  HCT 54.4* 53.4* 49.0*  MCV 81.1 79.3* 78.4*  MCH 25.8* 25.9* 25.1*  MCHC 31.8 32.6 32.0  RDW 16.0* 15.1 14.0  PLT 230 218 222   Thyroid No results for input(s): "TSH", "FREET4" in the last 168 hours.  BNPNo results for input(s): "BNP", "PROBNP" in the last 168 hours.  DDimer No results for input(s): "DDIMER" in the last 168 hours.   Radiology/Studies:  CT ABDOMEN PELVIS W CONTRAST Result Date: 02/06/2024 CLINICAL DATA:  Left lower quadrant abdominal pain over the last 2 weeks. Chest pain. Shortness of breath. Cough. EXAM: CT ABDOMEN AND PELVIS WITH CONTRAST TECHNIQUE: Multidetector CT imaging of the abdomen and pelvis was performed using the standard protocol following bolus administration of  intravenous contrast. RADIATION DOSE REDUCTION: This exam was performed according to the departmental dose-optimization program which includes automated exposure control, adjustment of the mA and/or kV according to patient size and/or use of iterative reconstruction technique. CONTRAST:  OMNIPAQUE IOHEXOL 300 MG/ML  SOLN COMPARISON:  04/28/2020 CT scan FINDINGS: Lower chest: Left anterior descending, right, and circumflex coronary atherosclerosis along with descending thoracic aortic atherosclerosis. Centrilobular emphysema. Hepatobiliary: Unremarkable Pancreas: Abnormal edema and fluid signal in the pancreaticoduodenal groove and along the descending duodenum, possibilities may include groove pancreatitis or peptic ulcer disease. Pancreas divisum. Edema tracks along the inferior margin of the pancreatic head and uncinate process as well. Spleen: Unremarkable Adrenals/Urinary Tract: The adrenal glands appear normal. 1.9 by 2.6 by 2.4 cm left kidney lower pole hypodense lesion noted, assessment complicated by streak artifact from spinal hardware, suspected internal septations in this lesion. Renal mass not excluded, and dedicated renal protocol MRI of the abdomen (or alternatively, CT) is recommended in the nonacute setting to exclude malignancy. Other small hypodense renal lesions are likely cysts  although some are technically too small to characterize. Punctate nonobstructive bilateral renal calculi are observed. Stomach/Bowel: As noted above, there is edema tracking around the descending and proximal transverse duodenum which could be from bowel inflammation or groove pancreatitis. Vascular/Lymphatic: Atherosclerosis is present, including aortoiliac atherosclerotic disease. There is atheromatous plaque dorsally at the origin of the celiac trunk. Edema associated with the pancreas and duodenum appears to track in the retroperitoneum down along the IVC and somewhat indistinct right ovarian vein. The ovarian  vein appears to opacify proximally although is in an early contrast phase; the distal ovarian vein is indeterminate for patency. Reproductive: Anterior uterine body fibroid about 2.1 cm in diameter. Other: No supplemental non-categorized findings. Musculoskeletal: Posterolateral rod and pedicle screw fixator at L3-4 with mild grade 1 anterolisthesis at L3-4 and interbody spacers at this level. Suspected central narrowing of the thecal sac at L4-5 due to disc bulge. IMPRESSION: 1. Abnormal edema and fluid signal in the pancreaticoduodenal groove and along the descending duodenum, possibilities may include groove pancreatitis or peptic ulcer disease. 2. Pancreas divisum. 3. Edema tracks in the retroperitoneum down along the IVC and along the somewhat indistinct right ovarian vein. The ovarian vein appears to opacify proximally although is in an early contrast phase; the distal ovarian vein is indeterminate for patency. No obvious enlargement or specific abnormality along the right adnexa/ovary observed. 4. 1.9 by 2.6 by 2.4 cm left kidney lower pole hypodense lesion, assessment complicated by streak artifact from spinal hardware, suspected internal septations in this lesion. Renal mass not excluded, and dedicated renal protocol MRI of the abdomen (or alternatively, CT) is recommended in the nonacute setting to exclude malignancy. 5. Punctate nonobstructive bilateral renal calculi. 6. Anterior uterine body fibroid about 2.1 cm in diameter. 7. Posterolateral rod and pedicle screw fixator at L3-4 with mild grade 1 anterolisthesis at L3-4 and interbody spacers at this level. Suspected central narrowing of the thecal sac at L4-5 due to disc bulge. Aortic Atherosclerosis (ICD10-I70.0) and Emphysema (ICD10-J43.9). Electronically Signed   By: Gaylyn Rong M.D.   On: 02/06/2024 15:12   DG Chest 2 View Result Date: 02/06/2024 CLINICAL DATA:  Chest pain. EXAM: CHEST - 2 VIEW COMPARISON:  Chest radiograph dated February 01, 2024. FINDINGS: Stable cardiomediastinal silhouette. Aortic atherosclerosis. Emphysematous changes are again noted. No focal consolidation, pleural effusion, or pneumothorax. No acute osseous abnormality. IMPRESSION: No acute cardiopulmonary findings. Electronically Signed   By: Hart Robinsons M.D.   On: 02/06/2024 11:21     Assessment and Plan:   NSTEMI CAD with hx of RCA stent 2014 - presented with numerous complaints pertains to cardiac and GI system: 2 weeks ongoing chest pain at rest or with exertion, improve with nitroglycerin, radiating to left arm and shoulder, SOB, nausea, and paresthesia; abdominal pain/bloating, emesis, feeling food stuck sensation - Hs trop 196 >181 > 762 - EKG no acute findings - CTAP showign abnormal edema and fluid signal in the pancreaticoduodenal groove and along the descending duodenum, possibilities may include groove pancreatitis or peptic ulcer disease.Pancreas divisum. Edema tracks in the retroperitoneum down along the IVC and along the somewhat indistinct right ovarian vein. 1.9 by 2.6 by 2.4 cm left kidney lower pole hypodense lesion.  - will check Echo - will need left heart cath this admission, she is agreeable; however, given abnormal GI/GU findings on CT and significant lactic acidosis initially, it's best to pursue further imaging to define these abnormalities before cath with possible percutaneous intervention, in case these is concern  of malignancy or she requires surgical procedures that would prohibiting anti-thrombotic use for some time  - Medical therapy: continue  heparin gtt, continue PTA ASA 81mg  daily, coreg 12.5mg  BID, amlodipine 5mg , irbesartan 300mg , imdur 30mg , ranexa 500mg  BID; intolerant to statin, LDL 104, continue PTA zetia 10mg  today, consider outpatient lipid clinic referral; A1C 6.2%.   HTN HLD Abdominal pain with abnormal CTAP Esophageal stricture  Tobacco use Asthma/COPD  - per primary team  Informed Consent   Shared  Decision Making/Informed Consent{  The risks [stroke (1 in 1000), death (1 in 1000), kidney failure [usually temporary] (1 in 500), bleeding (1 in 200), allergic reaction [possibly serious] (1 in 200)], benefits (diagnostic support and management of coronary artery disease) and alternatives of a cardiac catheterization were discussed in detail with Ms. Peachey and she is willing to proceed.  Risk Assessment/Risk Scores:   TIMI Risk Score for Unstable Angina or Non-ST Elevation MI:   The patient's TIMI risk score is 6, which indicates a 41% risk of all cause mortality, new or recurrent myocardial infarction or need for urgent revascularization in the next 14 days.{  For questions or updates, please contact [facility] Please consult [email or web address] for contact info under  Signed, Cyndi Bender, NP  02/07/2024 11:25 AM  Patient seen and examined, note reviewed with the signed Advanced Practice Provider. I personally reviewed laboratory data, imaging studies and relevant notes. I independently examined the patient and formulated the important aspects of the plan. I have personally discussed the plan with the patient and/or family. Comments or changes to the note/plan are indicated below.  73 year old woman presenting with with elevated troponin and now being medically treated for NSTEMI.  GEN:  Well nourished, well developed in no acute distress HEENT: Mucous membranes moist, good dentition NECK: No JVD; No carotid bruits LYMPHATICS: No lymphadenopathy CARDIAC: S1S2 noted, RRR, no murmurs, rubs, gallops RESPIRATORY:  Clear to auscultation without rales, wheezing or rhonchi  ABDOMEN: Soft, non-tender, non-distended, bowel sounds noted, no guarding EXTREMITIES:No cyanosis, no cyanosis, no clubbing MUSCULOSKELETAL: No deformity  SKIN: Warm and dry NEUROLOGIC:  Alert and oriented x 3, nonfocal PSYCHIATRIC:  Normal affect, good insight  Assessment and plan  NSTEMI Coronary artery  disease history of RCA stenting 2014 Prediabetes Hypertension Hyperlipidemia Abdominal pain concerning for peptic ulcer disease versus esophageal microperforation history of esophageal stricture requiring dilatation Tobacco use Asthma/COPD  She certainly does have risk factors coronary artery disease, but at this time understanding her acute GI symptoms and getting more information is important and necessary before left heart cath.  For now she will remain on medical management for her NSTEMI continue her heparin gtt, she is currently on antianginals will continue these for now , not on Aspirin if GI is ok with this - we can add.  Echo is pending, will get further recs per echo findings    Blood pressure is elevated - home meds resumed will monitor if needed will adjust as appropriate- continue Amlodipine 5 mg daily, Carvedilol 12.5 mg twice a day, Imdur 60 mg daily, Irbesartan 300 mg daily and Ranexa 500 mg BID.   Smoking cessation advise.    Thomasene Ripple DO, MS [facility] Attending Cardiologist [facility]  [address] [place], [place] [address] [phone] Website: [email or web address]

## 2024-02-07 NOTE — Plan of Care (Signed)

## 2024-02-08 ENCOUNTER — Inpatient Hospital Stay (HOSPITAL_COMMUNITY)
Admission: EM | Disposition: A | Discharge status: Home / Self Care | Payer: Self-pay | Source: Home / Self Care | Attending: Thoracic Surgery (Cardiothoracic Vascular Surgery)

## 2024-02-08 ENCOUNTER — Inpatient Hospital Stay (HOSPITAL_COMMUNITY): Payer: Medicare HMO

## 2024-02-08 DIAGNOSIS — R079 Chest pain, unspecified: Secondary | ICD-10-CM | POA: Diagnosis not present

## 2024-02-08 DIAGNOSIS — I2510 Atherosclerotic heart disease of native coronary artery without angina pectoris: Secondary | ICD-10-CM

## 2024-02-08 DIAGNOSIS — E782 Mixed hyperlipidemia: Secondary | ICD-10-CM | POA: Diagnosis not present

## 2024-02-08 DIAGNOSIS — I10 Essential (primary) hypertension: Secondary | ICD-10-CM | POA: Diagnosis not present

## 2024-02-08 DIAGNOSIS — I214 Non-ST elevation (NSTEMI) myocardial infarction: Secondary | ICD-10-CM | POA: Diagnosis not present

## 2024-02-08 HISTORY — PX: IABP INSERTION: CATH118242

## 2024-02-08 HISTORY — PX: LEFT HEART CATH AND CORONARY ANGIOGRAPHY: CATH118249

## 2024-02-08 LAB — CBC
HCT: 48.9 % — ABNORMAL HIGH (ref 36.0–46.0)
Hemoglobin: 16.3 g/dL — ABNORMAL HIGH (ref 12.0–15.0)
MCH: 25.7 pg — ABNORMAL LOW (ref 26.0–34.0)
MCHC: 33.3 g/dL (ref 30.0–36.0)
MCV: 77.1 fL — ABNORMAL LOW (ref 80.0–100.0)
Platelets: 237 10*3/uL (ref 150–400)
RBC: 6.34 MIL/uL — ABNORMAL HIGH (ref 3.87–5.11)
RDW: 13.8 % (ref 11.5–15.5)
WBC: 11.1 10*3/uL — ABNORMAL HIGH (ref 4.0–10.5)
nRBC: 0 % (ref 0.0–0.2)

## 2024-02-08 LAB — ECHOCARDIOGRAM COMPLETE
Area-P 1/2: 5 cm2
Calc EF: 49.4 %
Height: 61 in
S' Lateral: 2.5 cm
Single Plane A2C EF: 42.1 %
Single Plane A4C EF: 49.5 %
Weight: 2204.8 [oz_av]

## 2024-02-08 LAB — BASIC METABOLIC PANEL
Anion gap: 14 (ref 5–15)
BUN: 7 mg/dL — ABNORMAL LOW (ref 8–23)
CO2: 24 mmol/L (ref 22–32)
Calcium: 9.2 mg/dL (ref 8.9–10.3)
Chloride: 96 mmol/L — ABNORMAL LOW (ref 98–111)
Creatinine, Ser: 0.82 mg/dL (ref 0.44–1.00)
GFR, Estimated: >60 mL/min (ref >60)
Glucose, Bld: 118 mg/dL — ABNORMAL HIGH (ref 70–99)
Potassium: 2.8 mmol/L — ABNORMAL LOW (ref 3.5–5.1)
Sodium: 134 mmol/L — ABNORMAL LOW (ref 135–145)

## 2024-02-08 LAB — ERYTHROPOIETIN: Erythropoietin: 6.8 m[IU]/mL (ref 2.6–18.5)

## 2024-02-08 LAB — LIPASE, BLOOD: Lipase: 21 U/L (ref 11–51)

## 2024-02-08 LAB — TROPONIN I (HIGH SENSITIVITY): Troponin I (High Sensitivity): 595 ng/L (ref <18)

## 2024-02-08 LAB — POTASSIUM: Potassium: 3.8 mmol/L (ref 3.5–5.1)

## 2024-02-08 LAB — MAGNESIUM: Magnesium: 1.7 mg/dL (ref 1.7–2.4)

## 2024-02-08 SURGERY — LEFT HEART CATH AND CORONARY ANGIOGRAPHY
Anesthesia: LOCAL | Laterality: Right

## 2024-02-08 MED ORDER — VERAPAMIL HCL 2.5 MG/ML IV SOLN
INTRAVENOUS | Status: AC
  Filled 2024-02-08: qty 2

## 2024-02-08 MED ORDER — POTASSIUM CHLORIDE CRYS ER 20 MEQ PO TBCR
20.0000 meq | EXTENDED_RELEASE_TABLET | Freq: Two times a day (BID) | ORAL | Status: DC
  Administered 2024-02-08 – 2024-02-09 (×3): 20 meq via ORAL
  Filled 2024-02-08 (×3): qty 1

## 2024-02-08 MED ORDER — SODIUM CHLORIDE 0.9% FLUSH
3.0000 mL | Freq: Two times a day (BID) | INTRAVENOUS | Status: DC
  Administered 2024-02-08 – 2024-02-09 (×3): 3 mL via INTRAVENOUS

## 2024-02-08 MED ORDER — SODIUM CHLORIDE 0.9% FLUSH
3.0000 mL | INTRAVENOUS | Status: DC | PRN
Start: 2024-02-08 — End: 2024-02-08

## 2024-02-08 MED ORDER — CHLORHEXIDINE GLUCONATE CLOTH 2 % EX PADS
6.0000 | MEDICATED_PAD | Freq: Every day | CUTANEOUS | Status: DC
  Administered 2024-02-08 – 2024-02-09 (×2): 6 via TOPICAL

## 2024-02-08 MED ORDER — SODIUM CHLORIDE 0.9% FLUSH: 3.0000 mL | Freq: Two times a day (BID) | INTRAVENOUS | Status: DC

## 2024-02-08 MED ORDER — HEPARIN SODIUM (PORCINE) 1000 UNIT/ML IJ SOLN
INTRAMUSCULAR | Status: DC | PRN
  Administered 2024-02-08: 5000 [IU] via INTRA_ARTERIAL

## 2024-02-08 MED ORDER — ACETAMINOPHEN 325 MG PO TABS
650.0000 mg | ORAL_TABLET | ORAL | Status: DC | PRN
  Filled 2024-02-08: qty 2

## 2024-02-08 MED ORDER — HEPARIN (PORCINE) 25000 UT/250ML-% IV SOLN: 950.0000 [IU]/h | INTRAVENOUS | Status: DC

## 2024-02-08 MED ORDER — NITROGLYCERIN IN D5W 200-5 MCG/ML-% IV SOLN
INTRAVENOUS | Status: AC
  Filled 2024-02-08: qty 250

## 2024-02-08 MED ORDER — LOSARTAN POTASSIUM 25 MG PO TABS
25.0000 mg | ORAL_TABLET | Freq: Every day | ORAL | Status: DC
  Administered 2024-02-08 – 2024-02-09 (×2): 25 mg via ORAL
  Filled 2024-02-08 (×2): qty 1

## 2024-02-08 MED ORDER — SODIUM CHLORIDE 0.9 % WEIGHT BASED INFUSION
1.0000 mL/kg/h | INTRAVENOUS | Status: DC
  Administered 2024-02-08: 1 mL/kg/h via INTRAVENOUS

## 2024-02-08 MED ORDER — HEPARIN (PORCINE) IN NACL 2-0.9 UNITS/ML
INTRAMUSCULAR | Status: DC | PRN
  Administered 2024-02-08: 10 mL via INTRA_ARTERIAL

## 2024-02-08 MED ORDER — FENTANYL CITRATE (PF) 100 MCG/2ML IJ SOLN
INTRAMUSCULAR | Status: AC
  Filled 2024-02-08: qty 2

## 2024-02-08 MED ORDER — MAGNESIUM SULFATE 4 GM/100ML IV SOLN
4.0000 g | Freq: Once | INTRAVENOUS | Status: AC
Start: 2024-02-08 — End: 2024-02-08
  Administered 2024-02-08: 4 g via INTRAVENOUS
  Filled 2024-02-08: qty 100

## 2024-02-08 MED ORDER — HEPARIN SODIUM (PORCINE) 1000 UNIT/ML IJ SOLN
INTRAMUSCULAR | Status: AC
  Filled 2024-02-08: qty 10

## 2024-02-08 MED ORDER — HEPARIN (PORCINE) 25000 UT/250ML-% IV SOLN
950.0000 [IU]/h | INTRAVENOUS | Status: DC
  Administered 2024-02-08 – 2024-02-10 (×2): 950 [IU]/h via INTRAVENOUS
  Filled 2024-02-08: qty 250

## 2024-02-08 MED ORDER — LABETALOL HCL 5 MG/ML IV SOLN
10.0000 mg | INTRAVENOUS | Status: DC | PRN
Start: 2024-02-08 — End: 2024-02-08

## 2024-02-08 MED ORDER — CARVEDILOL 25 MG PO TABS
25.0000 mg | ORAL_TABLET | Freq: Two times a day (BID) | ORAL | Status: DC
  Administered 2024-02-09 (×2): 25 mg via ORAL
  Filled 2024-02-08 (×2): qty 1

## 2024-02-08 MED ORDER — FUROSEMIDE 10 MG/ML IJ SOLN
20.0000 mg | Freq: Two times a day (BID) | INTRAMUSCULAR | Status: DC
Start: 2024-02-08 — End: 2024-02-08

## 2024-02-08 MED ORDER — POTASSIUM CHLORIDE 10 MEQ/100ML IV SOLN
10.0000 meq | INTRAVENOUS | Status: AC
  Administered 2024-02-08 (×5): 10 meq via INTRAVENOUS
  Filled 2024-02-08 (×5): qty 100

## 2024-02-08 MED ORDER — LIDOCAINE HCL (PF) 1 % IJ SOLN
INTRAMUSCULAR | Status: DC | PRN
  Administered 2024-02-08 (×2): 5 mL

## 2024-02-08 MED ORDER — SODIUM CHLORIDE 0.9 % IV SOLN: 250.0000 mL | INTRAVENOUS | Status: AC | PRN

## 2024-02-08 MED ORDER — POTASSIUM CHLORIDE CRYS ER 20 MEQ PO TBCR
40.0000 meq | EXTENDED_RELEASE_TABLET | Freq: Once | ORAL | Status: AC
  Administered 2024-02-08: 40 meq via ORAL
  Filled 2024-02-08: qty 2

## 2024-02-08 MED ORDER — SODIUM CHLORIDE 0.9% FLUSH: 3.0000 mL | INTRAVENOUS | Status: DC | PRN

## 2024-02-08 MED ORDER — ONDANSETRON HCL 4 MG/2ML IJ SOLN: 4.0000 mg | Freq: Four times a day (QID) | INTRAMUSCULAR | Status: DC | PRN

## 2024-02-08 MED ORDER — SODIUM CHLORIDE 0.9 % IV SOLN: 250.0000 mL | INTRAVENOUS | Status: DC | PRN

## 2024-02-08 MED ORDER — LABETALOL HCL 5 MG/ML IV SOLN: 10.0000 mg | INTRAVENOUS | Status: AC | PRN

## 2024-02-08 MED ORDER — SODIUM CHLORIDE 0.9 % WEIGHT BASED INFUSION
3.0000 mL/kg/h | INTRAVENOUS | Status: DC
  Administered 2024-02-08: 3 mL/kg/h via INTRAVENOUS

## 2024-02-08 MED ORDER — ACETAMINOPHEN 325 MG PO TABS: 650.0000 mg | ORAL_TABLET | ORAL | Status: DC | PRN

## 2024-02-08 MED ORDER — MORPHINE SULFATE (PF) 2 MG/ML IV SOLN
2.0000 mg | INTRAVENOUS | Status: DC | PRN
  Administered 2024-02-08 – 2024-02-09 (×3): 2 mg via INTRAVENOUS
  Filled 2024-02-08 (×3): qty 1

## 2024-02-08 MED ORDER — HYDRALAZINE HCL 20 MG/ML IJ SOLN: 10.0000 mg | INTRAMUSCULAR | Status: DC | PRN

## 2024-02-08 MED ORDER — HYDRALAZINE HCL 20 MG/ML IJ SOLN: 10.0000 mg | INTRAMUSCULAR | Status: AC | PRN

## 2024-02-08 MED ORDER — MIDAZOLAM HCL 2 MG/2ML IJ SOLN
INTRAMUSCULAR | Status: AC
  Filled 2024-02-08: qty 2

## 2024-02-08 MED ORDER — IOHEXOL 350 MG/ML SOLN
INTRAVENOUS | Status: DC | PRN
Start: 2024-02-08 — End: 2024-02-08
  Administered 2024-02-08: 50 mL

## 2024-02-08 MED ORDER — LIDOCAINE HCL (PF) 1 % IJ SOLN
INTRAMUSCULAR | Status: AC
  Filled 2024-02-08: qty 30

## 2024-02-08 MED ORDER — ASPIRIN 81 MG PO CHEW
81.0000 mg | CHEWABLE_TABLET | ORAL | Status: AC
  Administered 2024-02-08: 81 mg via ORAL
  Filled 2024-02-08: qty 1

## 2024-02-08 MED ORDER — FENTANYL CITRATE (PF) 100 MCG/2ML IJ SOLN
INTRAMUSCULAR | Status: DC | PRN
  Administered 2024-02-08 (×2): 25 ug via INTRAVENOUS

## 2024-02-08 MED ORDER — HEPARIN (PORCINE) IN NACL 1000-0.9 UT/500ML-% IV SOLN
INTRAVENOUS | Status: DC | PRN
  Administered 2024-02-08: 1000 mL

## 2024-02-08 MED ORDER — MIDAZOLAM HCL 2 MG/2ML IJ SOLN
INTRAMUSCULAR | Status: DC | PRN
  Administered 2024-02-08 (×2): 1 mg via INTRAVENOUS

## 2024-02-08 SURGICAL SUPPLY — 16 items
BALLN IABP SENSA PLUS 7.5F 40C (BALLOONS) ×2 IMPLANT
BALLOON IABP SENS PLUS 7.5F40C (BALLOONS) IMPLANT
CATH DIAG 6FR PIGTAIL ANGLED (CATHETERS) IMPLANT
CATH INFINITI AMBI 6FR TG (CATHETERS) IMPLANT
DEVICE RAD COMP TR BAND LRG (VASCULAR PRODUCTS) IMPLANT
GLIDESHEATH SLEND SS 6F .021 (SHEATH) IMPLANT
GUIDEWIRE SAF TJ AMPL .035X180 (WIRE) IMPLANT
KIT MICROPUNCTURE NIT STIFF (SHEATH) IMPLANT
KIT SINGLE USE MANIFOLD (KITS) IMPLANT
KIT SYRINGE INJ CVI SPIKEX1 (MISCELLANEOUS) IMPLANT
PACK CARDIAC CATHETERIZATION (CUSTOM PROCEDURE TRAY) ×3 IMPLANT
SET ATX-X65L (MISCELLANEOUS) IMPLANT
SHEATH PINNACLE 6F 10CM (SHEATH) IMPLANT
TUBING CIL FLEX 10 FLL-RA (TUBING) IMPLANT
WIRE EMERALD 3MM-J .035X260CM (WIRE) IMPLANT
WIRE MICRO SET SILHO 5FR 7 (SHEATH) IMPLANT

## 2024-02-08 NOTE — Progress Notes (Signed)
SLP Cancellation Note  Patient Details Name: Meghan Lynn MRN: [id number] DOB: 11-07-1951   Cancelled treatment:       Reason Eval/Treat Not Completed: Patient at procedure or test/unavailable. NPO for cardiac cath. SLP will f/u when patient cleared for PO's  Angela Nevin, MA, CCC-SLP Speech Therapy

## 2024-02-08 NOTE — Progress Notes (Signed)
Progress Note  Patient Name: Cindee Salt Date of Encounter: 02/08/2024  Primary Cardiologist: Chrystie Nose, MD   Subjective   Patient seen examined her bedside.  Reports that she is experiencing some shortness of breath because she had to walk to the bathroom this morning and midsternal chest discomfort.  She is concerned about this.  Inpatient Medications    Scheduled Meds:  amLODipine  5 mg Oral QHS   carvedilol  12.5 mg Oral BID WC   ezetimibe  10 mg Oral Daily   irbesartan  300 mg Oral QHS   isosorbide mononitrate  60 mg Oral Daily   pantoprazole (PROTONIX) IV  40 mg Intravenous Q12H   ranolazine  500 mg Oral BID   sodium chloride flush  3 mL Intravenous Q12H   umeclidinium-vilanterol  1 puff Inhalation Daily   Continuous Infusions:  cefTRIAXone (ROCEPHIN)  IV 2 g (02/07/24 1709)   heparin 950 Units/hr (02/07/24 0344)   metronidazole 500 mg (02/08/24 0524)   nitroGLYCERIN 15 mcg/min (02/06/24 1939)   PRN Meds: acetaminophen **OR** acetaminophen, albuterol, hydrALAZINE, labetalol, polyethylene glycol   Vital Signs    Vitals:   02/08/24 0025 02/08/24 0400 02/08/24 0559 02/08/24 0847  BP: (!) 159/79  (!) 151/96 (!) 149/94  Pulse: 86  86 82  Resp: 20  18   Temp: 98.5 F (36.9 C)  98.6 F (37 C)   TempSrc: Oral  Oral   SpO2: 96% 97% 96%   Weight:      Height:        Intake/Output Summary (Last 24 hours) at 02/08/2024 0913 Last data filed at 02/08/2024 1610 Gross per 24 hour  Intake 600.38 ml  Output 800 ml  Net -199.62 ml   Filed Weights   02/06/24 1023 02/07/24 0810 02/07/24 1300  Weight: 63.5 kg 65.8 kg 62.5 kg    Telemetry    Sinus rhythm- Personally Reviewed  ECG     - Personally Reviewed  Physical Exam    General: Comfortable Head: Atraumatic, normal size  Eyes: PEERLA, EOMI  Neck: Supple, normal JVD Cardiac: Normal S1, S2; RRR; no murmurs, rubs, or gallops Lungs: Clear to auscultation bilaterally Abd: Soft, nontender, no  hepatomegaly  Ext: warm, no edema Musculoskeletal: No deformities, BUE and BLE strength normal and equal Skin: Warm and dry, no rashes   Neuro: Alert and oriented to person, place, time, and situation, CNII-XII grossly intact, no focal deficits  Psych: Normal mood and affect   Labs    Chemistry Recent Labs  Lab 02/06/24 1052 02/07/24 0548 02/08/24 0504  NA 138 137 134*  K 3.4* 3.5 2.8*  CL 102 101 96*  CO2 23 26 24   GLUCOSE 129* 123* 118*  BUN 9 6* 7*  CREATININE 0.73 0.73 0.82  CALCIUM 9.5 9.1 9.2  PROT 8.2*  --   --   ALBUMIN 4.0  --   --   AST 19  --   --   ALT 13  --   --   ALKPHOS 69  --   --   BILITOT 0.9  --   --   GFRNONAA >60 >60 >60  ANIONGAP 13 10 14      Hematology Recent Labs  Lab 02/06/24 1052 02/07/24 0548 02/08/24 0504  WBC 11.3* 12.8* 11.1*  RBC 6.73* 6.25* 6.34*  HGB 17.4* 15.7* 16.3*  HCT 53.4* 49.0* 48.9*  MCV 79.3* 78.4* 77.1*  MCH 25.9* 25.1* 25.7*  MCHC 32.6 32.0 33.3  RDW 15.1  14.0 13.8  PLT 218 222 237    Cardiac EnzymesNo results for input(s): "TROPONINI" in the last 168 hours. No results for input(s): "TROPIPOC" in the last 168 hours.   BNPNo results for input(s): "BNP", "PROBNP" in the last 168 hours.   DDimer No results for input(s): "DDIMER" in the last 168 hours.   Radiology    DG ESOPHAGUS W SINGLE CM (SOL OR THIN BA) Result Date: 02/07/2024 CLINICAL DATA:  73 year old female with history of esophageal stricture, possible microperforation. Request for esophagram. EXAM: ESOPHAGUS/BARIUM SWALLOW/TABLET STUDY TECHNIQUE: Single contrast examination was performed using 110 cc of Omnipaque 300. This exam was performed by Buzzy Han, PA-C, and was supervised and interpreted by Dr. Reche Dixon. FLUOROSCOPY: Radiation Exposure Index (as provided by the fluoroscopic device): 21.9 mGy Kerma COMPARISON:  None Available. FINDINGS: Swallowing: Appears normal. Trace penetration seen on initial images. No aspiration seen. Esophagus: Normal  appearance. No contrast extravasation noted. No persistent narrowing to suggest stricture. Gastroesophageal reflux: None visualized. Ingested 13mm barium tablet: Passed normally. IMPRESSION: 1. Trace penetration without aspiration. 2.  No esophageal contrast extravasation to suggest perforation. 3.  No esophageal stricture. Procedure performed by Buzzy Han, PA-C. Exam supervised and interpreted by Jeronimo Greaves, M.D. Electronically Signed   By: Jeronimo Greaves M.D.   On: 02/07/2024 16:58   CT ABDOMEN PELVIS W CONTRAST Result Date: 02/06/2024 CLINICAL DATA:  Left lower quadrant abdominal pain over the last 2 weeks. Chest pain. Shortness of breath. Cough. EXAM: CT ABDOMEN AND PELVIS WITH CONTRAST TECHNIQUE: Multidetector CT imaging of the abdomen and pelvis was performed using the standard protocol following bolus administration of intravenous contrast. RADIATION DOSE REDUCTION: This exam was performed according to the departmental dose-optimization program which includes automated exposure control, adjustment of the mA and/or kV according to patient size and/or use of iterative reconstruction technique. CONTRAST:  OMNIPAQUE IOHEXOL 300 MG/ML  SOLN COMPARISON:  04/28/2020 CT scan FINDINGS: Lower chest: Left anterior descending, right, and circumflex coronary atherosclerosis along with descending thoracic aortic atherosclerosis. Centrilobular emphysema. Hepatobiliary: Unremarkable Pancreas: Abnormal edema and fluid signal in the pancreaticoduodenal groove and along the descending duodenum, possibilities may include groove pancreatitis or peptic ulcer disease. Pancreas divisum. Edema tracks along the inferior margin of the pancreatic head and uncinate process as well. Spleen: Unremarkable Adrenals/Urinary Tract: The adrenal glands appear normal. 1.9 by 2.6 by 2.4 cm left kidney lower pole hypodense lesion noted, assessment complicated by streak artifact from spinal hardware, suspected internal septations in this  lesion. Renal mass not excluded, and dedicated renal protocol MRI of the abdomen (or alternatively, CT) is recommended in the nonacute setting to exclude malignancy. Other small hypodense renal lesions are likely cysts although some are technically too small to characterize. Punctate nonobstructive bilateral renal calculi are observed. Stomach/Bowel: As noted above, there is edema tracking around the descending and proximal transverse duodenum which could be from bowel inflammation or groove pancreatitis. Vascular/Lymphatic: Atherosclerosis is present, including aortoiliac atherosclerotic disease. There is atheromatous plaque dorsally at the origin of the celiac trunk. Edema associated with the pancreas and duodenum appears to track in the retroperitoneum down along the IVC and somewhat indistinct right ovarian vein. The ovarian vein appears to opacify proximally although is in an early contrast phase; the distal ovarian vein is indeterminate for patency. Reproductive: Anterior uterine body fibroid about 2.1 cm in diameter. Other: No supplemental non-categorized findings. Musculoskeletal: Posterolateral rod and pedicle screw fixator at L3-4 with mild grade 1 anterolisthesis at L3-4 and interbody spacers at this  level. Suspected central narrowing of the thecal sac at L4-5 due to disc bulge. IMPRESSION: 1. Abnormal edema and fluid signal in the pancreaticoduodenal groove and along the descending duodenum, possibilities may include groove pancreatitis or peptic ulcer disease. 2. Pancreas divisum. 3. Edema tracks in the retroperitoneum down along the IVC and along the somewhat indistinct right ovarian vein. The ovarian vein appears to opacify proximally although is in an early contrast phase; the distal ovarian vein is indeterminate for patency. No obvious enlargement or specific abnormality along the right adnexa/ovary observed. 4. 1.9 by 2.6 by 2.4 cm left kidney lower pole hypodense lesion, assessment complicated by  streak artifact from spinal hardware, suspected internal septations in this lesion. Renal mass not excluded, and dedicated renal protocol MRI of the abdomen (or alternatively, CT) is recommended in the nonacute setting to exclude malignancy. 5. Punctate nonobstructive bilateral renal calculi. 6. Anterior uterine body fibroid about 2.1 cm in diameter. 7. Posterolateral rod and pedicle screw fixator at L3-4 with mild grade 1 anterolisthesis at L3-4 and interbody spacers at this level. Suspected central narrowing of the thecal sac at L4-5 due to disc bulge. Aortic Atherosclerosis (ICD10-I70.0) and Emphysema (ICD10-J43.9). Electronically Signed   By: Gaylyn Rong M.D.   On: 02/06/2024 15:12   DG Chest 2 View Result Date: 02/06/2024 CLINICAL DATA:  Chest pain. EXAM: CHEST - 2 VIEW COMPARISON:  Chest radiograph dated February 01, 2024. FINDINGS: Stable cardiomediastinal silhouette. Aortic atherosclerosis. Emphysematous changes are again noted. No focal consolidation, pleural effusion, or pneumothorax. No acute osseous abnormality. IMPRESSION: No acute cardiopulmonary findings. Electronically Signed   By: Hart Robinsons M.D.   On: 02/06/2024 11:21    Cardiac Studies   Prior left heart catheterization Lexiscan  Patient Profile     73 y.o. female  hx of CAD s/p DES x3 to distal/mid/prox RCA 08/2013, HTN, HLD, tobacco use, asthma/COPD, esophageal stricture.  Assessment & Plan    NSTEMI Coronary artery disease history of RCA stenting 2014 Prediabetes Hypertension Hyperlipidemia Abdominal pain concerning for peptic ulcer disease versus esophageal microperforation history of esophageal stricture requiring dilatation Tobacco use Asthma/COPD   GI has signed off at this time the do not anticipate any need for a procedure or esophageal dilatation. Meanwhile she tells me that she still is experiencing shortness of breath and is concerned and has not been able to have any resolution in the chest  pain. With her symptoms and NSTEMI, I would like to then proceed a left heart catheterization since there is no plan for any GI procedures. Informed Consent   Shared Decision Making/Informed Consent The risks [stroke (1 in 1000), death (1 in 1000), kidney failure [usually temporary] (1 in 500), bleeding (1 in 200), allergic reaction [possibly serious] (1 in 200)], benefits (diagnostic support and management of coronary artery disease) and alternatives of a cardiac catheterization were discussed in detail with Ms. Penado and she is willing to proceed.     Her echocardiogram is pending  I am going to give aspirin 81 mg daily, continue heparin drip, she is intolerant to statins she has been on nonstatin therapy with bempedoic acid here she is on Zetia 10 mg daily.  She should be considered for PCSK9 inhibitors in the outpatient setting.  Recent LDL is 104, goal for this woman should be less than 55.  She is also antianginals Imdur 60 mg daily, Ranexa 500 mg twice daily.    She is hypertensive I will increase her carvedilol to 25 mg twice daily and  continue amlodipine 5 L a day, Imdur 60 mg daily, irbesartan 300 mg daily.     For questions or updates, please contact [facility] Please consult [email or web address] for contact info under Cardiology/STEMI.      Osvaldo Shipper, DO  02/08/2024, 9:13 AM

## 2024-02-08 NOTE — Progress Notes (Signed)
Orthopedic Tech Progress Note Patient Details:  JAMILLA GALLI [birth year] [id number]  Spoke with secretary she stated " they had ordered a knee immobilizer from materials Patient ID: Cindee Salt, female   DOB: April 19, 1951, 73 y.o.   MRN: [id number]  Donald Pore 02/08/2024, 7:15 PM

## 2024-02-08 NOTE — Progress Notes (Signed)
PROGRESS NOTE    [name]  WUJ:[id number] DOB: October 06, 1951 DOA: 02/06/2024 PCP: Patient, No Pcp Per   Brief Narrative:  Meghan Lynn is a 73 y.o. female with past medical history significant for CAD s/p PCI, esophageal strictures requiring dilations, tobacco use disorder, asthma/COPD who presents to [facility] ED on 2/10 with recurrent chest pain.  Ongoing over the last 2 weeks, reported intermittent.  Also endorsing epigastric abdominal pain during this timeframe as well; but different timing than her chest pain.  Episodes lasting 5-10 minutes.  Worse with exertion.  Improved with oral nitroglycerin tablets.  Also complaining of nausea and feeling like food gets stuck in her throat.   Seen by cardiology in the outpatient office 02/03/2024 for chest pain.  She was started on Ranexa and arrange stress Myoview on 02/13/2024. Patient actually did not seek attention for her new chest pain/shortness of breath as above including with an ER visit a few days ago at [facility], however given the wait time since she left.   In the ED, temperature 98.8 F, HR 108, RR 23, BP 148/104, SpO2 98% on room air.  WBC 11.3, hemoglobin 17.4, platelet count 218.  Sodium 138, potassium 3.4, chloride 102, CO2 23, glucose 129, BUN 9, creatinine 0.73.  Lipase 26, AST 19, ALT 13, total bilirubin 0.9.  High sensitive troponin [phone].  Chest x-ray with no acute cardiopulmonary disease process.  CT abdomen/pelvis with contrast with abnormal edema/fluid pancreaticoduodenal groove along descending duodenum consistent with pancreatitis versus peptic ulcer disease, pancreas divisum, edema tracks retroperitoneum down along IVC and indistinct right ovarian vein 1.9 x 2.6 x 2.4 cm left kidney lower pole hypodense lesion complicated by streak Arco artifact from spinal hardware suspected internal septations renal mass not excluded, punctate nonobstructive bilateral renal calculi, anterior uterine body fibroid 2.1 cm,  suspected central narrowing thecal sac L4-5 due to disc bulge, aortic atherosclerosis, emphysema.  TRH consulted for admission for further evaluation management of chest pain, epigastric pain with imaging findings and symptoms concerning for possible microperforation esophagus and NSTEMI.  Assessment & Plan:   Principal Problem:   NSTEMI (non-ST elevated myocardial infarction) (HCC) Active Problems:   Erythrocytosis   Abdominal pain  Hx CAD s/p PCI/NSTEMI Patient presenting with 2-week history of chest pain, worse with exertion.  Improved with nitroglycerin tablets.  KG with no concerning dynamic changes to include ST elevation/depressions or T wave inversions. -- Hs Troponin [phone] -- Echocardiogram: Pending -- Nitroglycerin drip -- Heparin drip -- Monitor on telemetry -- Per cardiology will need left heart catheterization this admission.   Epigastric abdominal pain concerning for peptic ulcer disease versus esophageal microperforation / Hx esophageal stricture requiring dilatation Patient reporting epigastric abdominal pain over the last 2 weeks which is different than her chest pain as above.  History of esophageal strictures requiring dilatation in the past.  Reports difficulty swallowing solids feeling like they get "stuck".  Seen by GI, underwent esophagogram, esophageal stricture ruled out.  GI recommended introducing solid diet, if she were to have a problem again, they will consider the pyloric channel dilatation again but unless showing obstructive symptoms, would hold off on any EGD.  Per GI, "Edema on CT in PD groove and along the descending duodenum non-specific and I do not think warrants an endoscopy this admission. Manage conservatively".  GI signed off 02-07-2024.  She is now complaining of periumbilical pain, she is tender to palpation but soft abdomen.  CT abdomen completed just 2 days ago  is concerning for possible pancreatitis.  Lipase was normal.  I will recheck lipase  today.  Will treat pain with morphine.  Essential hypertension -- Amlodipine 5 mg p.o. daily -- Carvedilol 12.5 mg p.o. twice daily -- Imdur 60 mg p.o. daily -- Irbesartan 300 mg p.o. nightly -- Ranexa 5 mg p.o. twice daily.  Blood pressure slightly elevated, likely due to pain.  She has not received some of the morning antihypertensive medications yet.  Renal lesion: CT abdomen also showed 1.9 by 2.6 by 2.4 cm left kidney lower pole hypodense lesion,assessment complicated by streak artifact from spinal hardware, suspected internal septations in this lesion. Renal mass not excluded, and dedicated renal protocol MRI of the abdomen (or alternatively, CT) is recommended in the nonacute setting to exclude malignancy.  May pursue this as outpatient or perhaps inpatient once urgent procedures are completed such as cardiac cath.  Punctate nonobstructive bilateral nephrolithiasis: Patient's pain does not appear to be coming from this.  She is nontender at CVA but tender at periumbilical region.   Asthma/COPD -- Stable.  Anoro Ellipta 1 puff daily -- Albuterol neb every 6 hours as needed wheezing/shortness of breath   HLD -- Zetia 10 mg p.o. daily  Hypokalemia: Potassium 2.8.  She is n.p.o., will provide her with IV replacement.  Magnesium is borderline 1.7.  Will supplement that as well.   Tobacco use disorder I have discussed tobacco cessation with the patient.  I have counseled the patient regarding the negative impacts of continued tobacco use including but not limited to lung cancer, COPD, and cardiovascular disease.  I have discussed alternatives to tobacco and modalities that may help facilitate tobacco cessation including but not limited to biofeedback, hypnosis, and medications.  Total time spent with tobacco counseling was 5 minutes.   DVT prophylaxis: SCDs Start: 02/06/24 1552   Code Status: Full Code  Family Communication:  None present at bedside.  Plan of care discussed with patient  in length and he/she verbalized understanding and agreed with it.  Status is: Inpatient Remains inpatient appropriate because: Patient is scheduled for cardiac cath.   Estimated body mass index is 26.04 kg/m as calculated from the following:   Height as of this encounter: 5\' 1"  (1.549 m).   Weight as of this encounter: 62.5 kg.    Nutritional Assessment: Body mass index is 26.04 kg/m.[name] Kitchen Seen by dietician.  I agree with the assessment and plan as outlined below: Nutrition Status:        . Skin Assessment: I have examined the patient's skin and I agree with the wound assessment as performed by the wound care RN as outlined below:    Consultants:  GI-signed off Cardiology  Procedures:  As above  Antimicrobials:  Anti-infectives (From admission, onward)    Start     Dose/Rate Route Frequency Ordered Stop   02/06/24 1800  cefTRIAXone (ROCEPHIN) 2 g in sodium chloride 0.9 % 100 mL IVPB        2 g 200 mL/hr over 30 Minutes Intravenous Every 24 hours 02/06/24 1738     02/06/24 1800  metroNIDAZOLE (FLAGYL) IVPB 500 mg        500 mg 100 mL/hr over 60 Minutes Intravenous Every 12 hours 02/06/24 1738           Subjective: Patient seen and examined, complains of feeling little pain.  When she came in her pain was mostly chest pain and epigastric pain but this pain is different.  She denies any chest  pain or shortness of breath or palpitation at the moment.  Objective: Vitals:   02/08/24 0025 02/08/24 0400 02/08/24 0559 02/08/24 0847  BP: (!) 159/79  (!) 151/96 (!) 149/94  Pulse: 86  86 82  Resp: 20  18   Temp: 98.5 F (36.9 C)  98.6 F (37 C)   TempSrc: Oral  Oral   SpO2: 96% 97% 96%   Weight:      Height:        Intake/Output Summary (Last 24 hours) at 02/08/2024 0936 Last data filed at 02/08/2024 0981 Gross per 24 hour  Intake 600.38 ml  Output 800 ml  Net -199.62 ml   Filed Weights   02/06/24 1023 02/07/24 0810 02/07/24 1300  Weight: 63.5 kg 65.8 kg 62.5  kg    Examination:  General exam: Appears in pain Respiratory system: Clear to auscultation. Respiratory effort normal. Cardiovascular system: S1 & S2 heard, RRR. No JVD, murmurs, rubs, gallops or clicks. No pedal edema. Gastrointestinal system: Abdomen is nondistended, soft and periumbilical moderate tenderness. No organomegaly or masses felt. Normal bowel sounds heard. Central nervous system: Alert and oriented. No focal neurological deficits. Extremities: Symmetric 5 x 5 power. Skin: No rashes, lesions or ulcers Psychiatry: Judgement and insight appear normal. Mood & affect appropriate.    Data Reviewed: I have personally reviewed following labs and imaging studies  CBC: Recent Labs  Lab 02/01/24 2259 02/06/24 1052 02/07/24 0548 02/08/24 0504  WBC 8.6 11.3* 12.8* 11.1*  NEUTROABS  --  9.3*  --   --   HGB 17.3* 17.4* 15.7* 16.3*  HCT 54.4* 53.4* 49.0* 48.9*  MCV 81.1 79.3* 78.4* 77.1*  PLT 230 218 222 237   Basic Metabolic Panel: Recent Labs  Lab 02/01/24 2259 02/06/24 1052 02/07/24 0548 02/08/24 0504  NA 141 138 137 134*  K 3.8 3.4* 3.5 2.8*  CL 106 102 101 96*  CO2 23 23 26 24   GLUCOSE 110* 129* 123* 118*  BUN 8 9 6* 7*  CREATININE 0.95 0.73 0.73 0.82  CALCIUM 9.5 9.5 9.1 9.2  MG  --   --   --  1.7   GFR: Estimated Creatinine Clearance: 51.8 mL/min (by C-G formula based on SCr of 0.82 mg/dL). Liver Function Tests: Recent Labs  Lab 02/06/24 1052  AST 19  ALT 13  ALKPHOS 69  BILITOT 0.9  PROT 8.2*  ALBUMIN 4.0   Recent Labs  Lab 02/06/24 1052  LIPASE 26   No results for input(s): "AMMONIA" in the last 168 hours. Coagulation Profile: Recent Labs  Lab 02/07/24 0548  INR 1.1   Cardiac Enzymes: No results for input(s): "CKTOTAL", "CKMB", "CKMBINDEX", "TROPONINI" in the last 168 hours. BNP (last 3 results) No results for input(s): "PROBNP" in the last 8760 hours. HbA1C: Recent Labs    02/07/24 0548  HGBA1C 6.2*   CBG: Recent Labs  Lab  02/06/24 2136 02/07/24 0040 02/07/24 0600 02/07/24 1445 02/07/24 2001  GLUCAP 146* 183* 116* 124* 162*   Lipid Profile: Recent Labs    02/06/24 0548  CHOL 181  HDL 50  LDLCALC 104*  TRIG 137  CHOLHDL 3.6   Thyroid Function Tests: No results for input(s): "TSH", "T4TOTAL", "FREET4", "T3FREE", "THYROIDAB" in the last 72 hours. Anemia Panel: No results for input(s): "VITAMINB12", "FOLATE", "FERRITIN", "TIBC", "IRON", "RETICCTPCT" in the last 72 hours. Sepsis Labs: Recent Labs  Lab 02/06/24 1930 02/06/24 2204 02/07/24 0109 02/07/24 0558  LATICACIDVEN 4.6* 5.7* 1.8 1.4    Recent Results (from  the past 240 hours)  Culture, blood (Routine X 2) w Reflex to ID Panel     Status: None (Preliminary result)   Collection Time: 02/06/24  7:15 PM   Specimen: BLOOD  Result Value Ref Range Status   Specimen Description   Final    BLOOD BLOOD LEFT ARM Performed at [facility], [address] [place], [place] [address]    Special Requests   Final    BOTTLES DRAWN AEROBIC AND ANAEROBIC Blood Culture results may not be optimal due to an inadequate volume of blood received in culture bottles Performed at [facility], [address], [place], [place] [address]    Culture   Final    NO GROWTH 2 DAYS Performed at [facility], [address], [place], [place] [address]    Report Status PENDING  Incomplete  Culture, blood (Routine X 2) w Reflex to ID Panel     Status: None (Preliminary result)   Collection Time: 02/06/24  7:20 PM   Specimen: BLOOD  Result Value Ref Range Status   Specimen Description   Final    BLOOD BLOOD LEFT ARM Performed at [facility], [address], [place], [place] [address]    Special Requests   Final    BOTTLES DRAWN AEROBIC AND ANAEROBIC Blood Culture results may not be optimal due to an inadequate volume of blood received in culture bottles Performed at [facility], [address], [place], [place] [address]    Culture   Final    NO GROWTH 2 DAYS Performed at [facility], [address] [place], [place] [address]    Report Status PENDING  Incomplete     Radiology Studies: DG ESOPHAGUS W SINGLE CM (SOL OR THIN BA) Result Date: 02/07/2024 CLINICAL DATA:  73 year old female with history of esophageal stricture, possible microperforation. Request for esophagram. EXAM: ESOPHAGUS/BARIUM SWALLOW/TABLET STUDY TECHNIQUE: Single contrast examination was performed using 110 cc of Omnipaque 300. This exam was performed by Buzzy Han, PA-C, and was supervised and interpreted by Dr. Reche Dixon. FLUOROSCOPY: Radiation Exposure Index (as provided by the fluoroscopic device): 21.9 mGy Kerma COMPARISON:  None Available. FINDINGS: Swallowing: Appears normal. Trace penetration seen on initial images. No aspiration seen. Esophagus: Normal appearance. No contrast extravasation noted. No persistent narrowing to suggest stricture. Gastroesophageal reflux: None visualized. Ingested 13mm barium tablet: Passed normally. IMPRESSION: 1. Trace penetration without aspiration. 2.  No esophageal contrast extravasation to suggest perforation. 3.  No esophageal stricture. Procedure performed by Buzzy Han, PA-C. Exam supervised and interpreted by Jeronimo Greaves, M.D. Electronically Signed   By: Jeronimo Greaves M.D.   On: 02/07/2024 16:58   CT ABDOMEN PELVIS W CONTRAST Result Date: 02/06/2024 CLINICAL DATA:  Left lower quadrant abdominal pain over the last 2 weeks. Chest pain. Shortness of breath. Cough. EXAM: CT ABDOMEN AND PELVIS WITH CONTRAST TECHNIQUE: Multidetector CT imaging of the abdomen and pelvis was performed using the standard protocol following bolus administration of intravenous contrast. RADIATION DOSE REDUCTION: This exam was performed according to the departmental dose-optimization program which includes automated exposure control, adjustment of the mA and/or kV according to patient  size and/or use of iterative reconstruction technique. CONTRAST:  OMNIPAQUE IOHEXOL 300 MG/ML  SOLN COMPARISON:  04/28/2020 CT scan FINDINGS: Lower chest: Left anterior descending, right, and circumflex coronary atherosclerosis along with descending thoracic aortic atherosclerosis. Centrilobular emphysema. Hepatobiliary: Unremarkable Pancreas: Abnormal edema and fluid signal in the pancreaticoduodenal groove  and along the descending duodenum, possibilities may include groove pancreatitis or peptic ulcer disease. Pancreas divisum. Edema tracks along the inferior margin of the pancreatic head and uncinate process as well. Spleen: Unremarkable Adrenals/Urinary Tract: The adrenal glands appear normal. 1.9 by 2.6 by 2.4 cm left kidney lower pole hypodense lesion noted, assessment complicated by streak artifact from spinal hardware, suspected internal septations in this lesion. Renal mass not excluded, and dedicated renal protocol MRI of the abdomen (or alternatively, CT) is recommended in the nonacute setting to exclude malignancy. Other small hypodense renal lesions are likely cysts although some are technically too small to characterize. Punctate nonobstructive bilateral renal calculi are observed. Stomach/Bowel: As noted above, there is edema tracking around the descending and proximal transverse duodenum which could be from bowel inflammation or groove pancreatitis. Vascular/Lymphatic: Atherosclerosis is present, including aortoiliac atherosclerotic disease. There is atheromatous plaque dorsally at the origin of the celiac trunk. Edema associated with the pancreas and duodenum appears to track in the retroperitoneum down along the IVC and somewhat indistinct right ovarian vein. The ovarian vein appears to opacify proximally although is in an early contrast phase; the distal ovarian vein is indeterminate for patency. Reproductive: Anterior uterine body fibroid about 2.1 cm in diameter. Other: No supplemental  non-categorized findings. Musculoskeletal: Posterolateral rod and pedicle screw fixator at L3-4 with mild grade 1 anterolisthesis at L3-4 and interbody spacers at this level. Suspected central narrowing of the thecal sac at L4-5 due to disc bulge. IMPRESSION: 1. Abnormal edema and fluid signal in the pancreaticoduodenal groove and along the descending duodenum, possibilities may include groove pancreatitis or peptic ulcer disease. 2. Pancreas divisum. 3. Edema tracks in the retroperitoneum down along the IVC and along the somewhat indistinct right ovarian vein. The ovarian vein appears to opacify proximally although is in an early contrast phase; the distal ovarian vein is indeterminate for patency. No obvious enlargement or specific abnormality along the right adnexa/ovary observed. 4. 1.9 by 2.6 by 2.4 cm left kidney lower pole hypodense lesion, assessment complicated by streak artifact from spinal hardware, suspected internal septations in this lesion. Renal mass not excluded, and dedicated renal protocol MRI of the abdomen (or alternatively, CT) is recommended in the nonacute setting to exclude malignancy. 5. Punctate nonobstructive bilateral renal calculi. 6. Anterior uterine body fibroid about 2.1 cm in diameter. 7. Posterolateral rod and pedicle screw fixator at L3-4 with mild grade 1 anterolisthesis at L3-4 and interbody spacers at this level. Suspected central narrowing of the thecal sac at L4-5 due to disc bulge. Aortic Atherosclerosis (ICD10-I70.0) and Emphysema (ICD10-J43.9). Electronically Signed   By: Gaylyn Rong M.D.   On: 02/06/2024 15:12   DG Chest 2 View Result Date: 02/06/2024 CLINICAL DATA:  Chest pain. EXAM: CHEST - 2 VIEW COMPARISON:  Chest radiograph dated February 01, 2024. FINDINGS: Stable cardiomediastinal silhouette. Aortic atherosclerosis. Emphysematous changes are again noted. No focal consolidation, pleural effusion, or pneumothorax. No acute osseous abnormality. IMPRESSION: No  acute cardiopulmonary findings. Electronically Signed   By: Hart Robinsons M.D.   On: 02/06/2024 11:21    Scheduled Meds:  amLODipine  5 mg Oral QHS   carvedilol  12.5 mg Oral BID WC   ezetimibe  10 mg Oral Daily   irbesartan  300 mg Oral QHS   isosorbide mononitrate  60 mg Oral Daily   pantoprazole (PROTONIX) IV  40 mg Intravenous Q12H   ranolazine  500 mg Oral BID   sodium chloride flush  3 mL Intravenous Q12H  umeclidinium-vilanterol  1 puff Inhalation Daily   Continuous Infusions:  cefTRIAXone (ROCEPHIN)  IV 2 g (02/07/24 1709)   heparin 950 Units/hr (02/07/24 0344)   magnesium sulfate bolus IVPB     metronidazole 500 mg (02/08/24 0524)   nitroGLYCERIN 15 mcg/min (02/06/24 1939)   potassium chloride       LOS: 2 days   Hughie Closs, MD [facility]  02/08/2024, 9:36 AM   *Please note that this is a verbal dictation therefore any spelling or grammatical errors are due to the "Dragon [employer] One" system interpretation.  Please page via Amion and do not message via secure chat for urgent patient care matters. Secure chat can be used for non urgent patient care matters.  How to contact the [facility] Attending or Consulting provider 7A - 7P or covering provider during after hours 7P -7A, for this patient?  Check the care team in [facility] and look for a) attending/consulting [facility] provider listed and b) the [facility] team listed. Page or secure chat 7A-7P. Log into [email or web address] and use [facility]'s universal password to access. If you do not have the password, please contact the hospital operator. Locate the [facility] provider you are looking for under [facility] and page to a number that you can be directly reached. If you still have difficulty reaching the provider, please page the [facility] (Director on Call) for the Hospitalists listed on amion for assistance.

## 2024-02-08 NOTE — H&P (View-Only) (Signed)
Progress Note  Patient Name: Meghan Lynn Date of Encounter: 02/08/2024  Primary Cardiologist: Chrystie Nose, MD   Subjective   Patient seen examined her bedside.  Reports that she is experiencing some shortness of breath because she had to walk to the bathroom this morning and midsternal chest discomfort.  She is concerned about this.  Inpatient Medications    Scheduled Meds:  amLODipine  5 mg Oral QHS   carvedilol  12.5 mg Oral BID WC   ezetimibe  10 mg Oral Daily   irbesartan  300 mg Oral QHS   isosorbide mononitrate  60 mg Oral Daily   pantoprazole (PROTONIX) IV  40 mg Intravenous Q12H   ranolazine  500 mg Oral BID   sodium chloride flush  3 mL Intravenous Q12H   umeclidinium-vilanterol  1 puff Inhalation Daily   Continuous Infusions:  cefTRIAXone (ROCEPHIN)  IV 2 g (02/07/24 1709)   heparin 950 Units/hr (02/07/24 0344)   metronidazole 500 mg (02/08/24 0524)   nitroGLYCERIN 15 mcg/min (02/06/24 1939)   PRN Meds: acetaminophen **OR** acetaminophen, albuterol, hydrALAZINE, labetalol, polyethylene glycol   Vital Signs    Vitals:   02/08/24 0025 02/08/24 0400 02/08/24 0559 02/08/24 0847  BP: (!) 159/79  (!) 151/96 (!) 149/94  Pulse: 86  86 82  Resp: 20  18   Temp: 98.5 F (36.9 C)  98.6 F (37 C)   TempSrc: Oral  Oral   SpO2: 96% 97% 96%   Weight:      Height:        Intake/Output Summary (Last 24 hours) at 02/08/2024 0913 Last data filed at 02/08/2024 1610 Gross per 24 hour  Intake 600.38 ml  Output 800 ml  Net -199.62 ml   Filed Weights   02/06/24 1023 02/07/24 0810 02/07/24 1300  Weight: 63.5 kg 65.8 kg 62.5 kg    Telemetry    Sinus rhythm- Personally Reviewed  ECG     - Personally Reviewed  Physical Exam    General: Comfortable Head: Atraumatic, normal size  Eyes: PEERLA, EOMI  Neck: Supple, normal JVD Cardiac: Normal S1, S2; RRR; no murmurs, rubs, or gallops Lungs: Clear to auscultation bilaterally Abd: Soft, nontender, no  hepatomegaly  Ext: warm, no edema Musculoskeletal: No deformities, BUE and BLE strength normal and equal Skin: Warm and dry, no rashes   Neuro: Alert and oriented to person, place, time, and situation, CNII-XII grossly intact, no focal deficits  Psych: Normal mood and affect   Labs    Chemistry Recent Labs  Lab 02/06/24 1052 02/07/24 0548 02/08/24 0504  NA 138 137 134*  K 3.4* 3.5 2.8*  CL 102 101 96*  CO2 23 26 24   GLUCOSE 129* 123* 118*  BUN 9 6* 7*  CREATININE 0.73 0.73 0.82  CALCIUM 9.5 9.1 9.2  PROT 8.2*  --   --   ALBUMIN 4.0  --   --   AST 19  --   --   ALT 13  --   --   ALKPHOS 69  --   --   BILITOT 0.9  --   --   GFRNONAA >60 >60 >60  ANIONGAP 13 10 14      Hematology Recent Labs  Lab 02/06/24 1052 02/07/24 0548 02/08/24 0504  WBC 11.3* 12.8* 11.1*  RBC 6.73* 6.25* 6.34*  HGB 17.4* 15.7* 16.3*  HCT 53.4* 49.0* 48.9*  MCV 79.3* 78.4* 77.1*  MCH 25.9* 25.1* 25.7*  MCHC 32.6 32.0 33.3  RDW 15.1  14.0 13.8  PLT 218 222 237    Cardiac EnzymesNo results for input(s): "TROPONINI" in the last 168 hours. No results for input(s): "TROPIPOC" in the last 168 hours.   BNPNo results for input(s): "BNP", "PROBNP" in the last 168 hours.   DDimer No results for input(s): "DDIMER" in the last 168 hours.   Radiology    DG ESOPHAGUS W SINGLE CM (SOL OR THIN BA) Result Date: 02/07/2024 CLINICAL DATA:  73 year old female with history of esophageal stricture, possible microperforation. Request for esophagram. EXAM: ESOPHAGUS/BARIUM SWALLOW/TABLET STUDY TECHNIQUE: Single contrast examination was performed using 110 cc of Omnipaque 300. This exam was performed by Buzzy Han, PA-C, and was supervised and interpreted by Dr. Reche Dixon. FLUOROSCOPY: Radiation Exposure Index (as provided by the fluoroscopic device): 21.9 mGy Kerma COMPARISON:  None Available. FINDINGS: Swallowing: Appears normal. Trace penetration seen on initial images. No aspiration seen. Esophagus: Normal  appearance. No contrast extravasation noted. No persistent narrowing to suggest stricture. Gastroesophageal reflux: None visualized. Ingested 13mm barium tablet: Passed normally. IMPRESSION: 1. Trace penetration without aspiration. 2.  No esophageal contrast extravasation to suggest perforation. 3.  No esophageal stricture. Procedure performed by Buzzy Han, PA-C. Exam supervised and interpreted by Jeronimo Greaves, M.D. Electronically Signed   By: Jeronimo Greaves M.D.   On: 02/07/2024 16:58   CT ABDOMEN PELVIS W CONTRAST Result Date: 02/06/2024 CLINICAL DATA:  Left lower quadrant abdominal pain over the last 2 weeks. Chest pain. Shortness of breath. Cough. EXAM: CT ABDOMEN AND PELVIS WITH CONTRAST TECHNIQUE: Multidetector CT imaging of the abdomen and pelvis was performed using the standard protocol following bolus administration of intravenous contrast. RADIATION DOSE REDUCTION: This exam was performed according to the departmental dose-optimization program which includes automated exposure control, adjustment of the mA and/or kV according to patient size and/or use of iterative reconstruction technique. CONTRAST:  OMNIPAQUE IOHEXOL 300 MG/ML  SOLN COMPARISON:  04/28/2020 CT scan FINDINGS: Lower chest: Left anterior descending, right, and circumflex coronary atherosclerosis along with descending thoracic aortic atherosclerosis. Centrilobular emphysema. Hepatobiliary: Unremarkable Pancreas: Abnormal edema and fluid signal in the pancreaticoduodenal groove and along the descending duodenum, possibilities may include groove pancreatitis or peptic ulcer disease. Pancreas divisum. Edema tracks along the inferior margin of the pancreatic head and uncinate process as well. Spleen: Unremarkable Adrenals/Urinary Tract: The adrenal glands appear normal. 1.9 by 2.6 by 2.4 cm left kidney lower pole hypodense lesion noted, assessment complicated by streak artifact from spinal hardware, suspected internal septations in this  lesion. Renal mass not excluded, and dedicated renal protocol MRI of the abdomen (or alternatively, CT) is recommended in the nonacute setting to exclude malignancy. Other small hypodense renal lesions are likely cysts although some are technically too small to characterize. Punctate nonobstructive bilateral renal calculi are observed. Stomach/Bowel: As noted above, there is edema tracking around the descending and proximal transverse duodenum which could be from bowel inflammation or groove pancreatitis. Vascular/Lymphatic: Atherosclerosis is present, including aortoiliac atherosclerotic disease. There is atheromatous plaque dorsally at the origin of the celiac trunk. Edema associated with the pancreas and duodenum appears to track in the retroperitoneum down along the IVC and somewhat indistinct right ovarian vein. The ovarian vein appears to opacify proximally although is in an early contrast phase; the distal ovarian vein is indeterminate for patency. Reproductive: Anterior uterine body fibroid about 2.1 cm in diameter. Other: No supplemental non-categorized findings. Musculoskeletal: Posterolateral rod and pedicle screw fixator at L3-4 with mild grade 1 anterolisthesis at L3-4 and interbody spacers at this  level. Suspected central narrowing of the thecal sac at L4-5 due to disc bulge. IMPRESSION: 1. Abnormal edema and fluid signal in the pancreaticoduodenal groove and along the descending duodenum, possibilities may include groove pancreatitis or peptic ulcer disease. 2. Pancreas divisum. 3. Edema tracks in the retroperitoneum down along the IVC and along the somewhat indistinct right ovarian vein. The ovarian vein appears to opacify proximally although is in an early contrast phase; the distal ovarian vein is indeterminate for patency. No obvious enlargement or specific abnormality along the right adnexa/ovary observed. 4. 1.9 by 2.6 by 2.4 cm left kidney lower pole hypodense lesion, assessment complicated by  streak artifact from spinal hardware, suspected internal septations in this lesion. Renal mass not excluded, and dedicated renal protocol MRI of the abdomen (or alternatively, CT) is recommended in the nonacute setting to exclude malignancy. 5. Punctate nonobstructive bilateral renal calculi. 6. Anterior uterine body fibroid about 2.1 cm in diameter. 7. Posterolateral rod and pedicle screw fixator at L3-4 with mild grade 1 anterolisthesis at L3-4 and interbody spacers at this level. Suspected central narrowing of the thecal sac at L4-5 due to disc bulge. Aortic Atherosclerosis (ICD10-I70.0) and Emphysema (ICD10-J43.9). Electronically Signed   By: Gaylyn Rong M.D.   On: 02/06/2024 15:12   DG Chest 2 View Result Date: 02/06/2024 CLINICAL DATA:  Chest pain. EXAM: CHEST - 2 VIEW COMPARISON:  Chest radiograph dated February 01, 2024. FINDINGS: Stable cardiomediastinal silhouette. Aortic atherosclerosis. Emphysematous changes are again noted. No focal consolidation, pleural effusion, or pneumothorax. No acute osseous abnormality. IMPRESSION: No acute cardiopulmonary findings. Electronically Signed   By: Hart Robinsons M.D.   On: 02/06/2024 11:21    Cardiac Studies   Prior left heart catheterization Lexiscan  Patient Profile     73 y.o. female  hx of CAD s/p DES x3 to distal/mid/prox RCA 08/2013, HTN, HLD, tobacco use, asthma/COPD, esophageal stricture.  Assessment & Plan    NSTEMI Coronary artery disease history of RCA stenting 2014 Prediabetes Hypertension Hyperlipidemia Abdominal pain concerning for peptic ulcer disease versus esophageal microperforation history of esophageal stricture requiring dilatation Tobacco use Asthma/COPD   GI has signed off at this time the do not anticipate any need for a procedure or esophageal dilatation. Meanwhile she tells me that she still is experiencing shortness of breath and is concerned and has not been able to have any resolution in the chest  pain. With her symptoms and NSTEMI, I would like to then proceed a left heart catheterization since there is no plan for any GI procedures. Informed Consent   Shared Decision Making/Informed Consent The risks [stroke (1 in 1000), death (1 in 1000), kidney failure [usually temporary] (1 in 500), bleeding (1 in 200), allergic reaction [possibly serious] (1 in 200)], benefits (diagnostic support and management of coronary artery disease) and alternatives of a cardiac catheterization were discussed in detail with Ms. Penado and she is willing to proceed.     Her echocardiogram is pending  I am going to give aspirin 81 mg daily, continue heparin drip, she is intolerant to statins she has been on nonstatin therapy with bempedoic acid here she is on Zetia 10 mg daily.  She should be considered for PCSK9 inhibitors in the outpatient setting.  Recent LDL is 104, goal for this woman should be less than 55.  She is also antianginals Imdur 60 mg daily, Ranexa 500 mg twice daily.    She is hypertensive I will increase her carvedilol to 25 mg twice daily and  continue amlodipine 5 L a day, Imdur 60 mg daily, irbesartan 300 mg daily.     For questions or updates, please contact [facility] Please consult [email or web address] for contact info under Cardiology/STEMI.      Osvaldo Shipper, DO  02/08/2024, 9:13 AM

## 2024-02-08 NOTE — Plan of Care (Signed)

## 2024-02-08 NOTE — Progress Notes (Signed)
Received a phone call from radiology looking to notify the provider of new radiology results. Was told to have the provider cal [phone]. Spoke with Dr. Izora Ribas with cardiology and provided the number to contact.

## 2024-02-08 NOTE — Progress Notes (Signed)
IABP time was set an hour ahead of actual time. I adjusted the time on the IABP to the correct time but this will result in 2 strips reading around 2000.

## 2024-02-08 NOTE — Consult Note (Signed)
Advanced Heart Failure Team Consult Note   Primary Physician: Patient, No Pcp Per Cardiologist:  Chrystie Nose, MD  Reason for Consultation: CAD/CHF  HPI:    Meghan Lynn is seen today for evaluation of CAD/CHF at the request of Dr. Lynnette Caffey.   73 y.o. with history of CAD, COPD with active smoking, hyperlipidemia, and dysphagia was admitted on 2/10 with chest pain episodes that had been on and off x 2 wks.  Patient also had abdominal pain and chronic dysphagia.  HS-TnI elevated with peak 762.  Lactate was initially elevated at 5.4 but decreased to 1.4 on most recent check.  Given dysphagia, patient had a barium swallow showing no stricture.  Echo was done today, I reviewed the study.  This showed EF 45% with severe HK to AK of the mid-apical anteroseptal wall, the apical wall segments, and the true apex.  The basal segments were hyperkinetic and there was evidence for a related outflow gradient.   The patient has a history of DES x 3 to the RCA in 2014.    Given NSTEMI, patient had cath today.  I reviewed cath films from today which showed totally occluded RCA with collaterals, 80% proximal and 70% mid LCx stenosis, 90% distal LCx, 99% mid/distal LAD stenosis. LVEDP 13.  With ongoing chest pain, IABP was placed and patient is currently chest pain-free.  She will need TCTS consult for possible CABG.   Home Medications Prior to Admission medications   Medication Sig Start Date End Date Taking? Authorizing Provider  albuterol (VENTOLIN HFA) 108 (90 Base) MCG/ACT inhaler Inhale 2 puffs into the lungs every 6 (six) hours as needed for wheezing or shortness of breath. 05/11/23  Yes Hunsucker, Lesia Sago, MD  amLODipine (NORVASC) 5 MG tablet TAKE 1 TABLET (5 MG TOTAL) BY MOUTH DAILY. 09/29/23  Yes Hilty, Lisette Abu, MD  Bempedoic Acid-Ezetimibe (NEXLIZET) 180-10 MG TABS Take 1 tablet by mouth at bedtime. 02/03/24  Yes Sharlene Dory, PA-C  Biotin 1 MG CAPS Take 1 mg by mouth daily.    Yes  [provider]  carvedilol (COREG) 12.5 MG tablet TAKE 1 TABLET (12.5 MG TOTAL) BY MOUTH 2 (TWO) TIMES DAILY WITH A MEAL. KEEP OV. 04/27/23  Yes Hilty, Lisette Abu, MD  isosorbide mononitrate (IMDUR) 30 MG 24 hr tablet Take 1 tablet (30 mg total) by mouth daily. 02/06/24  Yes Hilty, Lisette Abu, MD  Multiple Vitamin (MULTIVITAMIN WITH MINERALS) TABS Take 1 tablet by mouth daily.   Yes [provider]  nitroGLYCERIN (NITROSTAT) 0.4 MG SL tablet Place 1 tablet (0.4 mg total) under the tongue every 5 (five) minutes as needed for chest pain. 04/26/23 02/06/24 Yes Wittenborn, Deborah, NP  omeprazole (PRILOSEC) 20 MG capsule Take 20 mg by mouth daily.   Yes [provider]  ranolazine (RANEXA) 500 MG 12 hr tablet Take 1 tablet (500 mg total) by mouth 2 (two) times daily. 02/04/24  Yes Yvonna Alanis L, PA-C  umeclidinium-vilanterol (ANORO ELLIPTA) 62.5-25 MCG/ACT AEPB Inhale 1 puff into the lungs daily. 05/11/23  Yes Hunsucker, Lesia Sago, MD  valsartan (DIOVAN) 320 MG tablet TAKE 1 TABLET (320 MG TOTAL) BY MOUTH DAILY. KEEP OV. 05/09/23  Yes Hilty, Lisette Abu, MD    Past Medical History: Past Medical History:  Diagnosis Date   Active smoker    Anginal pain (HCC)    Arthritis    Asthma    patient denies   Bronchitis due to tobacco use  Burning sensation of feet    Coronary artery disease    3 stents to RCA   Dilatation of esophagus    Gastritis    GERD (gastroesophageal reflux disease)    Headache(784.0)    cluster every day   Headache, migraine    daily   Hyperlipidemia    Hypertension    Peptic ulcer disease    Pneumonia    Shortness of breath dyspnea    with exertion    Past Surgical History: Past Surgical History:  Procedure Laterality Date   BACK SURGERY     BALLOON DILATION N/A 05/30/2013   Procedure: BALLOON DILATION;  Surgeon: Barrie Folk, MD;  Location: [facility] ENDOSCOPY;  Service: Endoscopy;  Laterality: N/A;   BALLOON DILATION N/A 08/02/2013   Procedure: BALLOON  DILATION;  Surgeon: Barrie Folk, MD;  Location: [facility] ENDOSCOPY;  Service: Endoscopy;  Laterality: N/A;   BALLOON DILATION N/A 04/29/2016   Procedure: BALLOON DILATION;  Surgeon: Dorena Cookey, MD;  Location: [facility] ENDOSCOPY;  Service: Endoscopy;  Laterality: N/A;   BALLOON DILATION N/A 06/16/2020   Procedure: BALLOON DILATION;  Surgeon: Kerin Salen, MD;  Location: [facility] ENDOSCOPY;  Service: Gastroenterology;  Laterality: N/A;   BIOPSY  06/16/2020   Procedure: BIOPSY;  Surgeon: Kerin Salen, MD;  Location: [facility] ENDOSCOPY;  Service: Gastroenterology;;   CARDIAC CATHETERIZATION  03/07/2002   moderte CAD 80% (mid RCA) - Dr. Chanda Busing   CARDIAC CATHETERIZATION  09/13/2003   Cypher 2.5x57mm and 2.5x18 and Taxus 2.75x78mm to distal, mid, prox RCA (Dr. Laurell Josephs)   CARDIOLITE MYOCARDIAL PERFUSION STUDY  05/2006   positive bruce protocol, low risk, EF 80%   CARDIOVASCULAR STRESS TEST  2010   COLONOSCOPY     COLONOSCOPY WITH PROPOFOL N/A 06/16/2020   Procedure: COLONOSCOPY WITH PROPOFOL;  Surgeon: Kerin Salen, MD;  Location: [facility] ENDOSCOPY;  Service: Gastroenterology;  Laterality: N/A;   COLONOSCOPY WITH PROPOFOL N/A 06/21/2020   Procedure: COLONOSCOPY WITH PROPOFOL;  Surgeon: Kathi Der, MD;  Location: [facility] ENDOSCOPY;  Service: Gastroenterology;  Laterality: N/A;   CORONARY ANGIOPLASTY  2004   stents x 3   DILATATION & CURETTAGE/HYSTEROSCOPY WITH MYOSURE N/A 08/19/2016   Procedure: DILATATION & CURETTAGE/HYSTEROSCOPY WITH MYOSURE;  Surgeon: Maxie Better, MD;  Location: [facility] ORS;  Service: Gynecology;  Laterality: N/A;   ESOPHAGOGASTRODUODENOSCOPY N/A 05/30/2013   Procedure: ESOPHAGOGASTRODUODENOSCOPY (EGD);  Surgeon: Barrie Folk, MD;  Location: [facility] ENDOSCOPY;  Service: Endoscopy;  Laterality: N/A;   ESOPHAGOGASTRODUODENOSCOPY N/A 08/02/2013   Procedure: ESOPHAGOGASTRODUODENOSCOPY (EGD);  Surgeon: Barrie Folk, MD;  Location: [facility] ENDOSCOPY;  Service: Endoscopy;  Laterality: N/A;  barb /ja    ESOPHAGOGASTRODUODENOSCOPY (EGD) WITH PROPOFOL N/A 04/29/2016   Procedure: ESOPHAGOGASTRODUODENOSCOPY (EGD) WITH PROPOFOL;  Surgeon: Dorena Cookey, MD;  Location: [facility] ENDOSCOPY;  Service: Endoscopy;  Laterality: N/A;   ESOPHAGOGASTRODUODENOSCOPY (EGD) WITH PROPOFOL N/A 06/16/2020   Procedure: ESOPHAGOGASTRODUODENOSCOPY (EGD) WITH PROPOFOL;  Surgeon: Kerin Salen, MD;  Location: [facility] ENDOSCOPY;  Service: Gastroenterology;  Laterality: N/A;   FRACTURE SURGERY     HEMOSTASIS CLIP PLACEMENT  06/21/2020   Procedure: HEMOSTASIS CLIP PLACEMENT;  Surgeon: Kathi Der, MD;  Location: [facility] ENDOSCOPY;  Service: Gastroenterology;;   ORIF ELBOW FRACTURE  2006   POLYPECTOMY  06/16/2020   Procedure: POLYPECTOMY;  Surgeon: Kerin Salen, MD;  Location: [facility] ENDOSCOPY;  Service: Gastroenterology;;   POLYPECTOMY  06/21/2020   Procedure: POLYPECTOMY;  Surgeon: Kathi Der, MD;  Location: [facility] ENDOSCOPY;  Service: Gastroenterology;;   TRANSTHORACIC ECHOCARDIOGRAM  07/2008   normal  LV function, mild MR, mild TR, trace pulm valve regurg    Family History: Family History  Problem Relation Age of Onset   Diabetes Mother    CAD Mother        CABG   Bone cancer Father    Hypertension Father    Heart disease Brother    Kidney disease Sister    Cancer Sister    Diabetes Sister    Hyperlipidemia Sister    Hypertension Sister     Social History: Social History   Socioeconomic History   Marital status: Widowed    Spouse name: Not on file   Number of children: 3   Years of education: 57   Highest education level: Not on file  Occupational History    Employer: [employer]  Tobacco Use   Smoking status: Every Day    Current packs/day: 0.60    Average packs/day: 0.6 packs/day for 45.0 years (27.0 ttl pk-yrs)    Types: Cigarettes   Smokeless tobacco: Never   Tobacco comments:    Smokes 7 packs a week of cigarettes. 05/11/23 Tay  Vaping Use   Vaping status: Never Used  Substance and Sexual Activity   Alcohol  use: Yes    Alcohol/week: 0.0 standard drinks of alcohol    Comment: occ   Drug use: No   Sexual activity: Yes    Birth control/protection: Abstinence  Other Topics Concern   Not on file  Social History Narrative   Epworth Sleepiness scale score: 5   Social Drivers of Corporate investment banker Strain: Not on file  Food Insecurity: No Food Insecurity (02/07/2024)   Hunger Vital Sign    Worried About Running Out of Food in the Last Year: Never true    Ran Out of Food in the Last Year: Never true  Transportation Needs: No Transportation Needs (02/07/2024)   PRAPARE - Administrator, Civil Service (Medical): No    Lack of Transportation (Non-Medical): No  Physical Activity: Not on file  Stress: Not on file  Social Connections: Moderately Isolated (02/07/2024)   Social Connection and Isolation Panel [NHANES]    Frequency of Communication with Friends and Family: More than three times a week    Frequency of Social Gatherings with Friends and Family: Twice a week    Attends Religious Services: More than 4 times per year    Active Member of [employer] or Organizations: No    Attends Banker Meetings: Never    Marital Status: Widowed    Allergies:  Allergies  Allergen Reactions   Atorvastatin Other (See Comments)    Muscle cramping   Latex Itching   Propoxyphene Nausea And Vomiting    Darvocet    Objective:    Vital Signs:   Temp:  [97.8 F (36.6 C)-98.6 F (37 C)] 98.6 F (37 C) (02/12 1953) Pulse Rate:  [70-242] 78 (02/12 2015) Resp:  [13-23] 17 (02/12 2015) BP: (105-159)/(68-118) 115/83 (02/12 2015) SpO2:  [90 %-97 %] 92 % (02/12 2015) Last BM Date : 02/05/24  Weight change: Filed Weights   02/06/24 1023 02/07/24 0810 02/07/24 1300  Weight: 63.5 kg 65.8 kg 62.5 kg    Intake/Output:   Intake/Output Summary (Last 24 hours) at 02/08/2024 2045 Last data filed at 02/08/2024 2000 Gross per 24 hour  Intake 1912.43 ml  Output 1400 ml  Net 512.43  ml      Physical Exam    General:  Well appearing. No resp  difficulty HEENT: normal Neck: supple. JVP not elevated. Carotids 2+ bilat; no bruits. No lymphadenopathy or thyromegaly appreciated. Cor: PMI nondisplaced. Regular rate & rhythm. No rubs, gallops. 2/6 early SEM RUSB. Lungs: clear Abdomen: soft, nontender, nondistended. No hepatosplenomegaly. No bruits or masses. Good bowel sounds. Extremities: no cyanosis, clubbing, rash, edema Neuro: alert & orientedx3, cranial nerves grossly intact. moves all 4 extremities w/o difficulty. Affect pleasant   Telemetry   NSR 80s (personally reviewed)  EKG    NSR, normal (personally reviewed)  Labs   Basic Metabolic Panel: Recent Labs  Lab 02/01/24 2259 02/06/24 1052 02/07/24 0548 02/08/24 0504 02/08/24 1453  NA 141 138 137 134*  --   K 3.8 3.4* 3.5 2.8* 3.8  CL 106 102 101 96*  --   CO2 23 23 26 24   --   GLUCOSE 110* 129* 123* 118*  --   BUN 8 9 6* 7*  --   CREATININE 0.95 0.73 0.73 0.82  --   CALCIUM 9.5 9.5 9.1 9.2  --   MG  --   --   --  1.7  --     Liver Function Tests: Recent Labs  Lab 02/06/24 1052  AST 19  ALT 13  ALKPHOS 69  BILITOT 0.9  PROT 8.2*  ALBUMIN 4.0   Recent Labs  Lab 02/06/24 1052 02/08/24 0531  LIPASE 26 21   No results for input(s): "AMMONIA" in the last 168 hours.  CBC: Recent Labs  Lab 02/01/24 2259 02/06/24 1052 02/07/24 0548 02/08/24 0504  WBC 8.6 11.3* 12.8* 11.1*  NEUTROABS  --  9.3*  --   --   HGB 17.3* 17.4* 15.7* 16.3*  HCT 54.4* 53.4* 49.0* 48.9*  MCV 81.1 79.3* 78.4* 77.1*  PLT 230 218 222 237    Cardiac Enzymes: No results for input(s): "CKTOTAL", "CKMB", "CKMBINDEX", "TROPONINI" in the last 168 hours.  BNP: BNP (last 3 results) No results for input(s): "BNP" in the last 8760 hours.  ProBNP (last 3 results) No results for input(s): "PROBNP" in the last 8760 hours.   CBG: Recent Labs  Lab 02/06/24 2136 02/07/24 0040 02/07/24 0600 02/07/24 1445  02/07/24 2001  GLUCAP 146* 183* 116* 124* 162*    Coagulation Studies: Recent Labs    02/07/24 0548  LABPROT 14.2  INR 1.1     Imaging   DG Chest Port 1 View Result Date: 02/08/2024 CLINICAL DATA:  Aortic balloon pump EXAM: PORTABLE CHEST 1 VIEW COMPARISON:  X-ray 02/06/2024 FINDINGS: No consolidation, pneumothorax or effusion. Normal cardiopericardial silhouette without edema. Chronic lung changes identified. There is a marker seen overlying the mid descending thoracic aorta. With the history this could be the marker for the aortic balloon pump. Overlapping cardiac leads. Critical Value/emergent results were called by telephone at the time of interpretation on 02/08/2024 at 7:39 pm to provider [facility] , who verbally acknowledged these results. IMPRESSION: Hyperinflation with chronic lung changes.  No pneumothorax or edema. Linear radiopaque density overlying the mid descending thoracic aorta. With the history this could be the location of the air balloon pump. Please correlate clinical findings and assess need for repositioning Electronically Signed   By: Karen Kays M.D.   On: 02/08/2024 19:53   CARDIAC CATHETERIZATION Result Date: 02/08/2024   Prox RCA to Dist RCA lesion is 100% stenosed.   Dist LAD lesion is 99% stenosed.   Prox Cx to Mid Cx lesion is 80% stenosed.   Mid Cx to Dist Cx lesion is 70% stenosed.  Dist Cx lesion is 90% stenosed. 1.  Severe multivessel disease with TIMI II flow in the distal LAD.  Due to ongoing chest pain, an intra-aortic balloon pump was placed and the patient was rendered chest pain-free. 2.  LVEDP of 13 mmHg. Recommendation: The case was discussed with Dr. Shirlee Latch who will assume care.  The patient will be transferred to the 2 heart unit and a cardiothoracic surgical consult will be obtained.   ECHOCARDIOGRAM COMPLETE Result Date: 02/08/2024    ECHOCARDIOGRAM REPORT   Patient Name:   Meghan Lynn Date of Exam: 02/08/2024 Medical Rec #:  [id number]        Height:       61.0 in Accession #:    [id number]      Weight:       137.8 lb Date of Birth:  31-Jul-1951       BSA:          1.612 m Patient Age:    72 years        BP:           149/94 mmHg Patient Gender: F               HR:           85 bpm. Exam Location:  Inpatient Procedure: 2D Echo, Cardiac Doppler and Color Doppler (Both Spectral and Color            Flow Doppler were utilized during procedure). Indications:    Chest Pain R07.9  History:        Patient has no prior history of Echocardiogram examinations.  Sonographer:    Harriette Bouillon RDCS Referring Phys: [id number] Cyndi Bender IMPRESSIONS  1. Left ventricular ejection fraction, by estimation, is 45 to 50%. Left ventricular ejection fraction by 2D MOD biplane is 49.4 %. The left ventricle has mildly decreased function. The left ventricle demonstrates regional wall motion abnormalities (see  scoring diagram/findings for description). Left ventricular diastolic parameters are consistent with Grade I diastolic dysfunction (impaired relaxation). There is severe hypokinesis of the left ventricular, mid-apical anteroseptal wall, apical segment and anterior wall. The base of the heart is hyperdynamic. Consider LAD territory disease vs. Takatsubo cardiomyopathy.  2. Right ventricular systolic function is normal. The right ventricular size is normal.  3. The mitral valve is grossly normal. Trivial mitral valve regurgitation.  4. The aortic valve is tricuspid. Aortic valve regurgitation is not visualized. No aortic stenosis is present. Comparison(s): No prior Echocardiogram. Conclusion(s)/Recommendation(s): Findings suggest LAD territory ischemia -probably mid-distal disease vs possibly Takatsubo cardiomyopathy. FINDINGS  Left Ventricle: Left ventricular ejection fraction, by estimation, is 45 to 50%. Left ventricular ejection fraction by 2D MOD biplane is 49.4 %. The left ventricle has mildly decreased function. The left ventricle demonstrates regional wall motion  abnormalities. Severe hypokinesis of the left ventricular, mid-apical anteroseptal wall, apical segment and anterior wall. Strain imaging was not performed. The left ventricular internal cavity size was normal in size. There is no left ventricular hypertrophy. Left ventricular diastolic parameters are consistent with Grade I diastolic dysfunction (impaired relaxation). Normal left ventricular filling pressure.  LV Wall Scoring: The mid and distal lateral wall, mid and distal anterior septum, and entire apex are hypokinetic. The base of the heart is hyperdynamic. Consider LAD territory disease vs. Takatsubo cardiomyopathy. Right Ventricle: The right ventricular size is normal. No increase in right ventricular wall thickness. Right ventricular systolic function is normal. Left Atrium: Left atrial size was normal in size. Right Atrium:  Right atrial size was normal in size. Pericardium: There is no evidence of pericardial effusion. Mitral Valve: The mitral valve is grossly normal. Trivial mitral valve regurgitation. Tricuspid Valve: The tricuspid valve is grossly normal. Tricuspid valve regurgitation is trivial. Aortic Valve: The aortic valve is tricuspid. Aortic valve regurgitation is not visualized. No aortic stenosis is present. Pulmonic Valve: The pulmonic valve was not well visualized. Pulmonic valve regurgitation is not visualized. Aorta: The aortic root and ascending aorta are structurally normal, with no evidence of dilitation. Venous: The inferior vena cava was not well visualized. IAS/Shunts: No atrial level shunt detected by color flow Doppler. Additional Comments: 3D imaging was not performed.  LEFT VENTRICLE PLAX 2D                        Biplane EF (MOD) LVIDd:         3.70 cm         LV Biplane EF:   Left LVIDs:         2.50 cm                          ventricular LV PW:         1.00 cm                          ejection LV IVS:        1.00 cm                          fraction by LVOT diam:     1.90 cm                           2D MOD LV SV:         69                               biplane is LV SV Index:   43                               49.4 %. LVOT Area:     2.84 cm                                Diastology                                LV e' medial:    3.81 cm/s LV Volumes (MOD)               LV E/e' medial:  8.8 LV vol d, MOD    59.4 ml       LV e' lateral:   6.42 cm/s A2C:                           LV E/e' lateral: 5.2 LV vol d, MOD    57.9 ml A4C: LV vol s, MOD    34.4 ml A2C: LV vol s, MOD    29.2 ml A4C: LV SV MOD A2C:   25.0 ml LV SV MOD A4C:   57.9 ml  LV SV MOD BP:    30.2 ml RIGHT VENTRICLE RV S prime:     11.00 cm/s TAPSE (M-mode): 1.4 cm LEFT ATRIUM             Index        RIGHT ATRIUM           Index LA diam:        3.10 cm 1.92 cm/m   RA Area:     10.60 cm LA Vol (A2C):   32.6 ml 20.22 ml/m  RA Volume:   21.00 ml  13.03 ml/m LA Vol (A4C):   33.6 ml 20.84 ml/m LA Biplane Vol: 34.0 ml 21.09 ml/m  AORTIC VALVE LVOT Vmax:   121.00 cm/s LVOT Vmean:  72.200 cm/s LVOT VTI:    0.245 m  AORTA Ao Root diam: 2.70 cm Ao Asc diam:  2.90 cm MITRAL VALVE MV Area (PHT): 5.00 cm    SHUNTS MV E velocity: 33.40 cm/s  Systemic VTI:  0.24 m MV A velocity: 68.60 cm/s  Systemic Diam: 1.90 cm MV E/A ratio:  0.49 Zoila Shutter MD Electronically signed by Zoila Shutter MD Signature Date/Time: 02/08/2024/3:44:39 PM    Final      Medications:     Current Medications:  carvedilol  25 mg Oral BID WC   Chlorhexidine Gluconate Cloth  6 each Topical Daily   ezetimibe  10 mg Oral Daily   furosemide  20 mg Intravenous BID   isosorbide mononitrate  60 mg Oral Daily   losartan  25 mg Oral QHS   pantoprazole (PROTONIX) IV  40 mg Intravenous Q12H   potassium chloride  20 mEq Oral BID   ranolazine  500 mg Oral BID   sodium chloride flush  3 mL Intravenous Q12H   sodium chloride flush  3 mL Intravenous Q12H   umeclidinium-vilanterol  1 puff Inhalation Daily    Infusions:  sodium chloride     cefTRIAXone  (ROCEPHIN)  IV Stopped (02/08/24 1907)   metronidazole 100 mL/hr at 02/08/24 2000   nitroGLYCERIN 30 mcg/min (02/08/24 2000)     Assessment/Plan   1. CAD: NSTEMI this admission, peak HS-TnI 762.  No significant ECG changes.  Patient has history of DES x 3 to RCA in 2014.  Coronary angiography today was reviewed, shows totally occluded RCA with collaterals, 80% proximal and 70% mid LCx stenosis, 90% distal LCx, 99% mid/distal LAD stenosis. CAD was thought most amenable to CABG.  IABP was placed for angina given ongoing chest pain with resolution.  - Will consult TCTS for CABG evaluation.  - IABP at 1:1 for anginal relief.  Will resume heparin gtt for IABP.  - Continue current Imdur and ranolazine.  - Would continue current Coreg 25 mg bid.  - Patient is unable to tolerate statins, she is currently on Zetia.  She will need a PCSK9 inhibitor after discharge.  2. Ischemic cardiomyopathy: I reviewed today's echo, this showed EF 45% with severe HK to AK of the mid-apical anteroseptal wall, the apical wall segments, and the true apex.  The basal segments were hyperkinetic and there was evidence for a related outflow gradient.  LVEDP only 13 on cath today. She is not volume overloaded on exam. IABP was placed in cath lab today not for hemodynamics but for angina control.   - Will need to watch carefully with IABP in place, theoretically the drop in afterload could worsen her LVOT gradient in setting of hyperdynamic LV basal segment function.  -  Would continue full dose Coreg 25 mg bid with LVOT gradient and hyperdynamic LV basal segments.  - Transition to lower dose ARB for now, use losartan 25 mg daily rather than irbesartan 300 mg daily.  - I think we can hold Lasix for now.  - Stop amlodipine for now.  3. Dysphagia: Barium swallow showed no stricture.  4. COPD: Active smoker.  Counseled cessation. Continue Anoro inhaler.  5. Abdominal pain: No evidence for pancreatitis with normal lipase x 2.  GI  has signed off.  6. ID: Blood cultures negative, WBCs 11.1.  Afebrile.  She has been on ceftriaxone/Flagyl, this appears to have been started for ?abdominal source.  - I do not see a clear indication for antibiotics.  Will stop after today.   Length of Stay: 2  Marca Ancona, MD  02/08/2024, 8:45 PM  Advanced Heart Failure Team Pager [phone] (M-F; 7a - 5p)  Please contact [facility] for night-coverage after hours (4p -7a ) and weekends on [email or web address]

## 2024-02-08 NOTE — Interval H&P Note (Signed)
History and Physical Interval Note:  02/08/2024 4:09 PM  Meghan Lynn  has presented today for surgery, with the diagnosis of nstemi.  The various methods of treatment have been discussed with the patient and family. After consideration of risks, benefits and other options for treatment, the patient has consented to  Procedure(s): LEFT HEART CATH AND CORONARY ANGIOGRAPHY (N/A) as a surgical intervention.  The patient's history has been reviewed, patient examined, no change in status, stable for surgery.  I have reviewed the patient's chart and labs.  Questions were answered to the patient's satisfaction.     Orbie Pyo

## 2024-02-08 NOTE — Progress Notes (Signed)
  Echocardiogram 2D Echocardiogram has been performed.  Leda Roys RDCS 02/08/2024, 11:16 AM

## 2024-02-08 NOTE — Plan of Care (Signed)
Cardiology plan of care note  Briefly 73 yo F with a history of multivessel disease. CXR notable for very low IABP.  Patient notes no symptoms She turned to have dinner and is feeling much better. No CP.  No hematuria.   Exam notable for  Gen: no distress  Cardiac: Normal R leg perfusion Respiratory: Clear to auscultation bilaterally MS: No edema  CXR shows that IABP is at least 80 cm below optimal placement. Under sterile conditions, transitioned to stand by and advanced system.  Repeat CXR is pending.  Discussed with overnight colleague for f/u CXR and potentially advance ~ 10 cm.   Riley Lam, MD FASE [facility] Cardiologist [facility]  [address] [place], [address] [place], [place] [address] [phone]  9:03 PM

## 2024-02-08 NOTE — Progress Notes (Addendum)
 PHARMACY - ANTICOAGULATION CONSULT NOTE  Pharmacy Consult for heparin  Indication: chest pain/ACS  Allergies  Allergen Reactions   Atorvastatin Other (See Comments)    Muscle cramping   Latex Itching   Propoxyphene Nausea And Vomiting    Darvocet    Patient Measurements: Height: 5\' 1"  (154.9 cm) Weight: 62.5 kg (137 lb 12.8 oz) IBW/kg (Calculated) : 47.8 Heparin Dosing Weight: 63.5 kg   Vital Signs: Temp: 97.8 F (36.6 C) (02/12 1432) Temp Source: Oral (02/12 1432) BP: 138/96 (02/12 1754) Pulse Rate: 73 (02/12 1754)  Labs: Recent Labs    02/06/24 1052 02/06/24 1259 02/06/24 2145 02/07/24 0548 02/07/24 0750 02/07/24 1216 02/07/24 1933 02/08/24 0504  HGB 17.4*  --   --  15.7*  --   --   --  16.3*  HCT 53.4*  --   --  49.0*  --   --   --  48.9*  PLT 218  --   --  222  --   --   --  237  APTT  --   --   --  77*  --   --   --   --   LABPROT  --   --   --  14.2  --   --   --   --   INR  --   --   --  1.1  --   --   --   --   HEPARINUNFRC  --   --    < >  --  <0.10* 0.67 0.36  --   CREATININE 0.73  --   --  0.73  --   --   --  0.82  TROPONINIHS 196* 181*  --  762*  --   --   --  595*   < > = values in this interval not displayed.    Estimated Creatinine Clearance: 51.8 mL/min (by C-G formula based on SCr of 0.82 mg/dL).   Medical History: 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.0)    cluster every day   Headache, migraine    daily   Hyperlipidemia    Hypertension    Peptic ulcer disease    Pneumonia    Shortness of breath dyspnea    with exertion    Assessment: 73 yo F with chest pain.  Pharmacy consulted to dose heparin drip.  No anticoagulants PTA.   Was  previously therapeutic on heparin infusion at 950 units/hr. Underwent cardiac cath this afternoon finding multivessel CAD - given ongoing CP decision made to place IABP. Received 5000 units of heparin during procedure. Discussed with MD and plan to restart hep 2 hr after TR band removed.   Goal of Therapy:  Heparin level 0.3-0.5 units/ml Monitor platelets by anticoagulation protocol: Yes   Plan:  -Restart heparin infusion at 950 units/hr 2 hr after TR band removed -Will order heparin level 6-8 hours after restart  -Monitor daily HL, CBC, and for s/sx of bleeding   Thank you for allowing pharmacy to participate in this patient's care,  Sherron Monday, PharmD, BCCCP Clinical  Pharmacist  Phone: [phone] 02/08/2024 6:11 PM  Please check AMION for all [facility] Pharmacy phone numbers After 10:00 PM, call Main Pharmacy [phone]

## 2024-02-09 ENCOUNTER — Encounter (HOSPITAL_COMMUNITY): Payer: Self-pay | Admitting: Internal Medicine

## 2024-02-09 ENCOUNTER — Inpatient Hospital Stay (HOSPITAL_COMMUNITY): Payer: Medicare HMO

## 2024-02-09 ENCOUNTER — Ambulatory Visit: Payer: Medicare HMO | Admitting: Nurse Practitioner

## 2024-02-09 DIAGNOSIS — I214 Non-ST elevation (NSTEMI) myocardial infarction: Secondary | ICD-10-CM | POA: Diagnosis not present

## 2024-02-09 DIAGNOSIS — I2510 Atherosclerotic heart disease of native coronary artery without angina pectoris: Secondary | ICD-10-CM | POA: Diagnosis not present

## 2024-02-09 DIAGNOSIS — Z01810 Encounter for preprocedural cardiovascular examination: Secondary | ICD-10-CM | POA: Diagnosis not present

## 2024-02-09 DIAGNOSIS — Z720 Tobacco use: Secondary | ICD-10-CM | POA: Diagnosis not present

## 2024-02-09 DIAGNOSIS — I119 Hypertensive heart disease without heart failure: Secondary | ICD-10-CM | POA: Diagnosis not present

## 2024-02-09 LAB — SURGICAL PCR SCREEN
MRSA, PCR: NEGATIVE
Staphylococcus aureus: NEGATIVE

## 2024-02-09 LAB — BASIC METABOLIC PANEL
Anion gap: 14 (ref 5–15)
BUN: 10 mg/dL (ref 8–23)
CO2: 23 mmol/L (ref 22–32)
Calcium: 9 mg/dL (ref 8.9–10.3)
Chloride: 100 mmol/L (ref 98–111)
Creatinine, Ser: 0.96 mg/dL (ref 0.44–1.00)
GFR, Estimated: >60 mL/min (ref >60)
Glucose, Bld: 116 mg/dL — ABNORMAL HIGH (ref 70–99)
Potassium: 4.2 mmol/L (ref 3.5–5.1)
Sodium: 137 mmol/L (ref 135–145)

## 2024-02-09 LAB — CBC
HCT: 46.1 % — ABNORMAL HIGH (ref 36.0–46.0)
Hemoglobin: 15.1 g/dL — ABNORMAL HIGH (ref 12.0–15.0)
MCH: 25.5 pg — ABNORMAL LOW (ref 26.0–34.0)
MCHC: 32.8 g/dL (ref 30.0–36.0)
MCV: 78 fL — ABNORMAL LOW (ref 80.0–100.0)
Platelets: 206 10*3/uL (ref 150–400)
RBC: 5.91 MIL/uL — ABNORMAL HIGH (ref 3.87–5.11)
RDW: 13.6 % (ref 11.5–15.5)
WBC: 10.3 10*3/uL (ref 4.0–10.5)
nRBC: 0 % (ref 0.0–0.2)

## 2024-02-09 LAB — HEPARIN LEVEL (UNFRACTIONATED)
Heparin Unfractionated: 0.36 [IU]/mL (ref 0.30–0.70)
Heparin Unfractionated: 0.38 [IU]/mL (ref 0.30–0.70)

## 2024-02-09 LAB — TYPE AND SCREEN
ABO/RH(D): O POS
Antibody Screen: NEGATIVE

## 2024-02-09 LAB — VAS US DOPPLER PRE CABG: Left ABI: 0.86

## 2024-02-09 LAB — MAGNESIUM: Magnesium: 2.4 mg/dL (ref 1.7–2.4)

## 2024-02-09 MED ORDER — HEPARIN 30,000 UNITS/1000 ML (OHS) CELLSAVER SOLUTION
Status: DC
  Filled 2024-02-09: qty 1000

## 2024-02-09 MED ORDER — NOREPINEPHRINE 4 MG/250ML-% IV SOLN
0.0000 ug/min | INTRAVENOUS | Status: DC
  Filled 2024-02-09: qty 250

## 2024-02-09 MED ORDER — PHENYLEPHRINE HCL-NACL 20-0.9 MG/250ML-% IV SOLN
30.0000 ug/min | INTRAVENOUS | Status: AC
  Administered 2024-02-10: 25 ug/min via INTRAVENOUS
  Filled 2024-02-09: qty 250

## 2024-02-09 MED ORDER — CHLORHEXIDINE GLUCONATE 0.12 % MT SOLN
15.0000 mL | Freq: Once | OROMUCOSAL | Status: AC
  Administered 2024-02-10: 15 mL via OROMUCOSAL

## 2024-02-09 MED ORDER — CHLORHEXIDINE GLUCONATE CLOTH 2 % EX PADS
6.0000 | MEDICATED_PAD | Freq: Once | CUTANEOUS | Status: AC
  Administered 2024-02-09: 6 via TOPICAL

## 2024-02-09 MED ORDER — PLASMA-LYTE A IV SOLN
INTRAVENOUS | Status: DC
  Filled 2024-02-09: qty 2.5

## 2024-02-09 MED ORDER — DEXMEDETOMIDINE HCL IN NACL 400 MCG/100ML IV SOLN
0.1000 ug/kg/h | INTRAVENOUS | Status: AC
  Administered 2024-02-10: .7 ug/kg/h via INTRAVENOUS
  Filled 2024-02-09: qty 100

## 2024-02-09 MED ORDER — VANCOMYCIN HCL 1250 MG/250ML IV SOLN
1250.0000 mg | INTRAVENOUS | Status: AC
  Administered 2024-02-10: 1250 mg via INTRAVENOUS
  Filled 2024-02-09: qty 250

## 2024-02-09 MED ORDER — METOPROLOL TARTRATE 12.5 MG HALF TABLET: 12.5000 mg | ORAL_TABLET | Freq: Once | ORAL | Status: DC

## 2024-02-09 MED ORDER — TRANEXAMIC ACID (OHS) PUMP PRIME SOLUTION
2.0000 mg/kg | INTRAVENOUS | Status: DC
  Filled 2024-02-09: qty 1.25

## 2024-02-09 MED ORDER — TRANEXAMIC ACID (OHS) BOLUS VIA INFUSION
15.0000 mg/kg | INTRAVENOUS | Status: AC
  Administered 2024-02-10: 937.5 mg via INTRAVENOUS
  Filled 2024-02-09: qty 938

## 2024-02-09 MED ORDER — CEFAZOLIN SODIUM-DEXTROSE 2-4 GM/100ML-% IV SOLN
2.0000 g | INTRAVENOUS | Status: AC
Start: 2024-02-10 — End: 2024-02-10
  Administered 2024-02-10: 2 g via INTRAVENOUS
  Filled 2024-02-09: qty 100

## 2024-02-09 MED ORDER — MAGNESIUM SULFATE 50 % IJ SOLN
40.0000 meq | INTRAMUSCULAR | Status: DC
  Filled 2024-02-09: qty 9.85

## 2024-02-09 MED ORDER — ISOSORBIDE MONONITRATE ER 30 MG PO TB24
90.0000 mg | ORAL_TABLET | Freq: Every day | ORAL | Status: DC
  Administered 2024-02-09: 90 mg via ORAL
  Filled 2024-02-09: qty 3

## 2024-02-09 MED ORDER — EPINEPHRINE HCL 5 MG/250ML IV SOLN IN NS
0.0000 ug/min | INTRAVENOUS | Status: DC
  Filled 2024-02-09: qty 250

## 2024-02-09 MED ORDER — ASPIRIN 81 MG PO TBEC
81.0000 mg | DELAYED_RELEASE_TABLET | Freq: Every day | ORAL | Status: DC
  Administered 2024-02-09: 81 mg via ORAL
  Filled 2024-02-09: qty 1

## 2024-02-09 MED ORDER — BISACODYL 5 MG PO TBEC
5.0000 mg | DELAYED_RELEASE_TABLET | Freq: Once | ORAL | Status: AC
  Administered 2024-02-09: 5 mg via ORAL
  Filled 2024-02-09: qty 1

## 2024-02-09 MED ORDER — TEMAZEPAM 15 MG PO CAPS: 15.0000 mg | ORAL_CAPSULE | Freq: Once | ORAL | Status: DC | PRN

## 2024-02-09 MED ORDER — TRANEXAMIC ACID 1000 MG/10ML IV SOLN
1.5000 mg/kg/h | INTRAVENOUS | Status: AC
  Administered 2024-02-10: 1.5 mg/kg/h via INTRAVENOUS
  Filled 2024-02-09: qty 25

## 2024-02-09 MED ORDER — MILRINONE LACTATE IN DEXTROSE 20-5 MG/100ML-% IV SOLN
0.3000 ug/kg/min | INTRAVENOUS | Status: DC
  Filled 2024-02-09: qty 100

## 2024-02-09 MED ORDER — INSULIN REGULAR(HUMAN) IN NACL 100-0.9 UT/100ML-% IV SOLN
INTRAVENOUS | Status: AC
Start: 2024-02-10 — End: 2024-02-10
  Administered 2024-02-10: .8 [IU]/h via INTRAVENOUS
  Filled 2024-02-09: qty 100

## 2024-02-09 MED ORDER — NITROGLYCERIN IN D5W 200-5 MCG/ML-% IV SOLN
2.0000 ug/min | INTRAVENOUS | Status: DC
  Filled 2024-02-09: qty 250

## 2024-02-09 MED ORDER — POTASSIUM CHLORIDE 2 MEQ/ML IV SOLN
80.0000 meq | INTRAVENOUS | Status: DC
  Filled 2024-02-09: qty 40

## 2024-02-09 MED ORDER — CEFAZOLIN SODIUM-DEXTROSE 2-4 GM/100ML-% IV SOLN
2.0000 g | INTRAVENOUS | Status: AC
  Administered 2024-02-10: 2 g via INTRAVENOUS
  Filled 2024-02-09: qty 100

## 2024-02-09 NOTE — Plan of Care (Signed)
  Problem: Education: Goal: Ability to describe self-care measures that may prevent or decrease complications (Diabetes Survival Skills Education) will improve Outcome: Progressing Goal: Individualized Educational Video(s) Outcome: Progressing   Problem: Coping: Goal: Ability to adjust to condition or change in health will improve Outcome: Progressing   Problem: Fluid Volume: Goal: Ability to maintain a balanced intake and output will improve Outcome: Progressing   Problem: Health Behavior/Discharge Planning: Goal: Ability to identify and utilize available resources and services will improve Outcome: Progressing Goal: Ability to manage health-related needs will improve Outcome: Progressing   Problem: Metabolic: Goal: Ability to maintain appropriate glucose levels will improve Outcome: Progressing   Problem: Nutritional: Goal: Maintenance of adequate nutrition will improve Outcome: Progressing Goal: Progress toward achieving an optimal weight will improve Outcome: Progressing   Problem: Skin Integrity: Goal: Risk for impaired skin integrity will decrease Outcome: Progressing   Problem: Tissue Perfusion: Goal: Adequacy of tissue perfusion will improve Outcome: Progressing   Problem: Education: Goal: Knowledge of General Education information will improve Description: Including pain rating scale, medication(s)/side effects and non-pharmacologic comfort measures Outcome: Progressing   Problem: Health Behavior/Discharge Planning: Goal: Ability to manage health-related needs will improve Outcome: Progressing   Problem: Clinical Measurements: Goal: Ability to maintain clinical measurements within normal limits will improve Outcome: Progressing Goal: Will remain free from infection Outcome: Progressing Goal: Diagnostic test results will improve Outcome: Progressing Goal: Respiratory complications will improve Outcome: Progressing Goal: Cardiovascular complication will  be avoided Outcome: Progressing   Problem: Activity: Goal: Risk for activity intolerance will decrease Outcome: Progressing   Problem: Nutrition: Goal: Adequate nutrition will be maintained Outcome: Progressing   Problem: Coping: Goal: Level of anxiety will decrease Outcome: Progressing   Problem: Elimination: Goal: Will not experience complications related to bowel motility Outcome: Progressing Goal: Will not experience complications related to urinary retention Outcome: Progressing   Problem: Pain Managment: Goal: General experience of comfort will improve and/or be controlled Outcome: Progressing   Problem: Safety: Goal: Ability to remain free from injury will improve Outcome: Progressing   Problem: Skin Integrity: Goal: Risk for impaired skin integrity will decrease Outcome: Progressing   Problem: Education: Goal: Understanding of CV disease, CV risk reduction, and recovery process will improve Outcome: Progressing Goal: Individualized Educational Video(s) Outcome: Progressing   Problem: Activity: Goal: Ability to return to baseline activity level will improve Outcome: Progressing   Problem: Cardiovascular: Goal: Ability to achieve and maintain adequate cardiovascular perfusion will improve Outcome: Progressing Goal: Vascular access site(s) Level 0-1 will be maintained Outcome: Progressing   Problem: Health Behavior/Discharge Planning: Goal: Ability to safely manage health-related needs after discharge will improve Outcome: Progressing   Problem: Cardiac: Goal: Ability to achieve and maintain adequate cardiopulmonary perfusion will improve Outcome: Progressing Goal: Vascular access site(s) Level 0-1 will be maintained Outcome: Progressing   Problem: Fluid Volume: Goal: Ability to achieve a balanced intake and output will improve Outcome: Progressing   Problem: Physical Regulation: Goal: Complications related to the disease process, condition or  treatment will be avoided or minimized Outcome: Progressing   Problem: Respiratory: Goal: Will regain and/or maintain adequate ventilation Outcome: Progressing

## 2024-02-09 NOTE — TOC Initial Note (Signed)
Transition of Care [facility]) - Initial/Assessment Note    Patient Details  Name: Meghan Lynn MRN: [id number] Date of Birth: [birth year]  Transition of Care [facility] CM/SW Contact:    Elliot Cousin, RN Phone Number: [phone] 02/09/2024, 5:21 PM   Clinical Narrative:                 TOC CM spoke to pt and dtr, Latasha at bedside. Pt gave permission to speak to dtr. Dtr states pt was independent pta. She was driving to appts. Dtr states she will be in the home to assist with care. She is agreeable to [facility] if needed at dc. Dtr requested RW with seat if pt needs DME for home.   Expected Discharge Plan: Home w Home Health Services Barriers to Discharge: Continued Medical Work up   Patient Goals and CMS Choice   CMS Medicare.gov Compare Post Acute Care list provided to:: Patient Represenative (must comment) Choice offered to / list presented to : Adult Children      Expected Discharge Plan and Services   Discharge Planning Services: CM Consult Post Acute Care Choice: Home Health Living arrangements for the past 2 months: Single Family Home                                      Prior Living Arrangements/Services Living arrangements for the past 2 months: Single Family Home Lives with:: Adult Children Patient language and need for interpreter reviewed:: Yes Do you feel safe going back to the place where you live?: Yes      Need for Family Participation in Patient Care: Yes (Comment) Care giver support system in place?: Yes (comment)   Criminal Activity/Legal Involvement Pertinent to Current Situation/Hospitalization: No - Comment as needed  Activities of Daily Living   ADL Screening (condition at time of admission) Independently performs ADLs?: Yes (appropriate for developmental age) Is the patient deaf or have difficulty hearing?: No Does the patient have difficulty seeing, even when wearing glasses/contacts?: No Does the patient have difficulty concentrating,  remembering, or making decisions?: No  Permission Sought/Granted Permission sought to share information with : Case Manager, PCP, Family Supports Permission granted to share information with : Yes, Verbal Permission Granted  Share Information with NAME: Keyondra Lagrand     Permission granted to share info w Relationship: daughter  Permission granted to share info w Contact Information: [phone]  Emotional Assessment Appearance:: Appears stated age Attitude/Demeanor/Rapport: Engaged Affect (typically observed): Accepting Orientation: : Oriented to Self, Oriented to Place, Oriented to  Time, Oriented to Situation   Psych Involvement: No (comment)  Admission diagnosis:  NSTEMI (non-ST elevated myocardial infarction) [facility]) [I21.4] Patient Active Problem List   Diagnosis Date Noted   NSTEMI (non-ST elevated myocardial infarction) (HCC) 02/06/2024   Erythrocytosis 02/06/2024   Abdominal pain 02/06/2024   Symptomatic anemia 06/21/2020   Lower GI bleed 06/20/2020   Dysuria 03/13/2020   H/O pyloric stenosis 01/31/2020   Acute pain of right wrist 01/21/2020   Weight gain 07/18/2018   Hair loss 07/18/2018   Screening for thyroid disorder 07/18/2018   Pneumonia    Peptic ulcer disease    Hypertension    Hyperlipidemia    Headache, migraine    Gastritis    Dilatation of esophagus    Coronary artery disease    Burning sensation of feet    Bronchitis due to tobacco use  Asthma    Arthritis    Anginal pain (HCC)    Weakness of both lower extremities 06/22/2017   Screening for abdominal aortic aneurysm 06/22/2017   Shortness of breath 09/29/2016   GERD (gastroesophageal reflux disease) 09/30/2015   Coronary artery disease due to lipid rich plaque 08/29/2014   HTN (hypertension) 08/29/2014   Dyslipidemia 08/29/2014   Smoker 08/29/2014   Spondylolisthesis of l3-4 01/27/2013   PCP:  Patient, No Pcp Per Pharmacy:   [id number] [place], [place] - [address] AT  [facility] [address] [place] [address] Phone: [phone] Fax: [phone]     Social Drivers of Health (SDOH) Social History: SDOH Screenings   Food Insecurity: No Food Insecurity (02/07/2024)  Housing: Low Risk  (02/07/2024)  Transportation Needs: No Transportation Needs (02/07/2024)  Utilities: Not At Risk (02/07/2024)  Alcohol Screen: Low Risk  (04/14/2020)  Depression (PHQ2-9): Low Risk  (04/14/2020)  Social Connections: Moderately Isolated (02/07/2024)  Tobacco Use: High Risk (02/07/2024)   SDOH Interventions:     Readmission Risk Interventions     No data to display

## 2024-02-09 NOTE — H&P (View-Only) (Signed)
 [address]       [place] [address]             [phone]        [name] [facility] Medical Record #[id number] Date of Birth: 01-10-1951  Referring: [facility] Primary Care: Patient, No Pcp Per Primary Cardiologist:Kenneth Alona Bene, MD  Chief Complaint:    Chief Complaint  Patient presents with   Chest Pain   History of Present Illness:      Meghan Lynn is a 73 yo AA female with history of GERD, Esophageal Stricture with recent failed dilation attempts, PUD, HTN, Dyslipidemia, Nicotine Abuse, and known CAD S/p multiple stents placement in the past.  She developed complaints of chest pain with radiation down into her arm.  She originally presented to the ED on 02/01/2024.  She felt her wait was too long and she left prior to being seen.  She contacted her Cardiologist office and was evaluated by Jari Favre PA-C whom recommended addition of Ranexa for symptom control.  She also felt patient undergo Myoview stress test.  Unfortunately she was unable to afford Ranexa and it was never taken.  She again presented to the ED on 02/06/2024 with complaints of severe stomach pain, dizziness, mild chest pain that developed several hours prior to presentation.  She took 3 NTG with relief of pain. She was also experiencing shortness of breath, cough, N/V, abdominal pain and headache.  EKG obtained showed no ST elevation, but she did have elevated Troponin levels.  CT scan of her abdomen showed some free fluid in the upper abdomen.  It was felt she would require further care and she was transferred to [facility] for further care.  Upon arrival to [facility] Cardiology consult was obtained who recommended Echocardiogram and cardiac catheterization.  GI consult was obtained, barium swallow showed no evidence of esophageal injury.  He felt that endoscopy would not be indicated.  He felt she could be managed conservatively and they signed off. Echocardiogram was obtained and  showed mildly reduced EF and no valvular abnormalities.  Cardiac catheterization was obtained and revealed severe multivessel CAD.  She had ongoing chest pain post procedure and IABP was placed.  She was admitted to the ICU and Cardiothoracic surgery consultation was requested.  The patient is currently chest pain free on Heparin and NTG drip.  She states her biggest complaint right now is back pain, due to her previous back surgeries.  She is a former smoke of 1 ppd having quit weeks ago.  The patient continues to work and is very active.  She has no family history of CAD.  The patient does not wish to have a sternotomy done.  She also unsure if she is willing to take blood products due to issues in past.  Current Activity/ Functional Status: Patient is independent with mobility/ambulation, transfers, ADL's, IADL's.   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.0)    cluster every day   Headache, migraine    daily   Hyperlipidemia    Hypertension  Peptic ulcer disease    Pneumonia    Shortness of breath dyspnea    with exertion    Past Surgical History:  Procedure Laterality Date   BACK SURGERY     BALLOON DILATION N/A 05/30/2013   Procedure: BALLOON DILATION;  Surgeon: Barrie Folk, MD;  Location: [facility] ENDOSCOPY;  Service: Endoscopy;  Laterality: N/A;   BALLOON DILATION N/A 08/02/2013   Procedure: BALLOON DILATION;  Surgeon: Barrie Folk, MD;  Location: [facility] ENDOSCOPY;  Service: Endoscopy;  Laterality: N/A;   BALLOON DILATION N/A 04/29/2016   Procedure: BALLOON DILATION;  Surgeon: Dorena Cookey, MD;  Location: [facility] ENDOSCOPY;  Service: Endoscopy;  Laterality: N/A;   BALLOON DILATION N/A 06/16/2020   Procedure:  BALLOON DILATION;  Surgeon: Kerin Salen, MD;  Location: [facility] ENDOSCOPY;  Service: Gastroenterology;  Laterality: N/A;   BIOPSY  06/16/2020   Procedure: BIOPSY;  Surgeon: Kerin Salen, MD;  Location: [facility] ENDOSCOPY;  Service: Gastroenterology;;   CARDIAC CATHETERIZATION  03/07/2002   moderte CAD 80% (mid RCA) - Dr. Chanda Busing   CARDIAC CATHETERIZATION  09/13/2003   Cypher 2.5x79mm and 2.5x18 and Taxus 2.75x20mm to distal, mid, prox RCA (Dr. Laurell Josephs)   CARDIOLITE MYOCARDIAL PERFUSION STUDY  05/2006   positive bruce protocol, low risk, EF 80%   CARDIOVASCULAR STRESS TEST  2010   COLONOSCOPY     COLONOSCOPY WITH PROPOFOL N/A 06/16/2020   Procedure: COLONOSCOPY WITH PROPOFOL;  Surgeon: Kerin Salen, MD;  Location: [facility] ENDOSCOPY;  Service: Gastroenterology;  Laterality: N/A;   COLONOSCOPY WITH PROPOFOL N/A 06/21/2020   Procedure: COLONOSCOPY WITH PROPOFOL;  Surgeon: Kathi Der, MD;  Location: [facility] ENDOSCOPY;  Service: Gastroenterology;  Laterality: N/A;   CORONARY ANGIOPLASTY  2004   stents x 3   DILATATION & CURETTAGE/HYSTEROSCOPY WITH MYOSURE N/A 08/19/2016   Procedure: DILATATION & CURETTAGE/HYSTEROSCOPY WITH MYOSURE;  Surgeon: Maxie Better, MD;  Location: [facility] ORS;  Service: Gynecology;  Laterality: N/A;   ESOPHAGOGASTRODUODENOSCOPY N/A 05/30/2013   Procedure: ESOPHAGOGASTRODUODENOSCOPY (EGD);  Surgeon: Barrie Folk, MD;  Location: [facility] ENDOSCOPY;  Service: Endoscopy;  Laterality: N/A;   ESOPHAGOGASTRODUODENOSCOPY N/A 08/02/2013   Procedure: ESOPHAGOGASTRODUODENOSCOPY (EGD);  Surgeon: Barrie Folk, MD;  Location: [facility] ENDOSCOPY;  Service: Endoscopy;  Laterality: N/A;  barb /ja   ESOPHAGOGASTRODUODENOSCOPY (EGD) WITH PROPOFOL N/A 04/29/2016   Procedure: ESOPHAGOGASTRODUODENOSCOPY (EGD) WITH PROPOFOL;  Surgeon: Dorena Cookey, MD;  Location: [facility] ENDOSCOPY;  Service: Endoscopy;  Laterality: N/A;   ESOPHAGOGASTRODUODENOSCOPY (EGD) WITH PROPOFOL N/A 06/16/2020   Procedure: ESOPHAGOGASTRODUODENOSCOPY (EGD) WITH  PROPOFOL;  Surgeon: Kerin Salen, MD;  Location: [facility] ENDOSCOPY;  Service: Gastroenterology;  Laterality: N/A;   FRACTURE SURGERY     HEMOSTASIS CLIP PLACEMENT  06/21/2020   Procedure: HEMOSTASIS CLIP PLACEMENT;  Surgeon: Kathi Der, MD;  Location: [facility] ENDOSCOPY;  Service: Gastroenterology;;   IABP INSERTION Right 02/08/2024   Procedure: IABP Insertion;  Surgeon: Orbie Pyo, MD;  Location: [facility] INVASIVE CV LAB;  Service: Cardiovascular;  Laterality: Right;   LEFT HEART CATH AND CORONARY ANGIOGRAPHY N/A 02/08/2024   Procedure: LEFT HEART CATH AND CORONARY ANGIOGRAPHY;  Surgeon: Orbie Pyo, MD;  Location: [facility] INVASIVE CV LAB;  Service: Cardiovascular;  Laterality: N/A;   ORIF ELBOW FRACTURE  2006   POLYPECTOMY  06/16/2020   Procedure: POLYPECTOMY;  Surgeon: Kerin Salen, MD;  Location: [facility] ENDOSCOPY;  Service: Gastroenterology;;   POLYPECTOMY  06/21/2020   Procedure: POLYPECTOMY;  Surgeon: Kathi Der, MD;  Location: [facility] ENDOSCOPY;  Service: Gastroenterology;;   TRANSTHORACIC ECHOCARDIOGRAM  07/2008  normal LV function, mild MR, mild TR, trace pulm valve regurg    Social History   Tobacco Use  Smoking Status Every Day   Current packs/day: 0.60   Average packs/day: 0.6 packs/day for 45.0 years (27.0 ttl pk-yrs)   Types: Cigarettes  Smokeless Tobacco Never  Tobacco Comments   Smokes 7 packs a week of cigarettes. 05/11/23 [place]    Social History   Substance and Sexual Activity  Alcohol Use Yes   Alcohol/week: 0.0 standard drinks of alcohol   Comment: occ     Allergies  Allergen Reactions   Atorvastatin Other (See Comments)    Muscle cramping   Latex Itching   Propoxyphene Nausea And Vomiting    Darvocet    Current Facility-Administered Medications  Medication Dose Route Frequency Provider Last Rate Last Admin   0.9 %  sodium chloride infusion  250 mL Intravenous PRN Hughie Closs, MD       acetaminophen (TYLENOL) tablet 650 mg  650 mg Oral Q4H PRN Orbie Pyo,  MD       albuterol (PROVENTIL) (2.5 MG/3ML) 0.083% nebulizer solution 2.5 mg  2.5 mg Nebulization Q6H PRN Nolberto Hanlon, MD       carvedilol (COREG) tablet 25 mg  25 mg Oral BID WC Tobb, Kardie, DO   25 mg at 02/09/24 9604   Chlorhexidine Gluconate Cloth 2 % PADS 6 each  6 each Topical Daily Hughie Closs, MD   6 each at 02/09/24 0925   ezetimibe (ZETIA) tablet 10 mg  10 mg Oral Daily [place], Eric J, DO   10 mg at 02/09/24 0925   heparin ADULT infusion 100 units/mL (25000 units/281mL)  950 Units/hr Intravenous Continuous Kendal Hymen, RPH 9.5 mL/hr at 02/09/24 0800 950 Units/hr at 02/09/24 0800   hydrALAZINE (APRESOLINE) injection 10 mg  10 mg Intravenous Q4H PRN Nolberto Hanlon, MD       isosorbide mononitrate (IMDUR) 24 hr tablet 90 mg  90 mg Oral Daily Andrey Farmer, PA-C   90 mg at 02/09/24 5409   labetalol (NORMODYNE) injection 20 mg  20 mg Intravenous Q2H PRN Nolberto Hanlon, MD       losartan (COZAAR) tablet 25 mg  25 mg Oral QHS Laurey Morale, MD   25 mg at 02/08/24 2226   morphine (PF) 2 MG/ML injection 2 mg  2 mg Intravenous Q3H PRN Hughie Closs, MD   2 mg at 02/09/24 8119   nitroGLYCERIN 50 mg in dextrose 5 % 250 mL (0.2 mg/mL) infusion  0-200 mcg/min Intravenous Continuous Andrey Farmer, PA-C 21 mL/hr at 02/09/24 0800 70 mcg/min at 02/09/24 0800   ondansetron (ZOFRAN) injection 4 mg  4 mg Intravenous Q6H PRN Orbie Pyo, MD       pantoprazole (PROTONIX) injection 40 mg  40 mg Intravenous Conchita Paris, MD   40 mg at 02/09/24 0924   polyethylene glycol (MIRALAX / GLYCOLAX) packet 17 g  17 g Oral Daily PRN Nolberto Hanlon, MD       potassium chloride SA (KLOR-CON M) CR tablet 20 mEq  20 mEq Oral BID Orbie Pyo, MD   20 mEq at 02/09/24 0925   ranolazine (RANEXA) 12 hr tablet 500 mg  500 mg Oral BID Nolberto Hanlon, MD   500 mg at 02/09/24 0925   sodium chloride flush (NS) 0.9 % injection 3 mL  3 mL Intravenous Conchita Paris, MD   3 mL at 02/09/24 0925   sodium  chloride  flush (NS) 0.9 % injection 3 mL  3 mL Intravenous Q12H Pahwani, Daleen Bo, MD   3 mL at 02/09/24 8657   umeclidinium-vilanterol (ANORO ELLIPTA) 62.5-25 MCG/ACT 1 puff  1 puff Inhalation Daily Nolberto Hanlon, MD   1 puff at 02/09/24 8469    Medications Prior to Admission  Medication Sig Dispense Refill Last Dose/Taking   albuterol (VENTOLIN HFA) 108 (90 Base) MCG/ACT inhaler Inhale 2 puffs into the lungs every 6 (six) hours as needed for wheezing or shortness of breath. 1 each 6 Taking As Needed   amLODipine (NORVASC) 5 MG tablet TAKE 1 TABLET (5 MG TOTAL) BY MOUTH DAILY. 90 tablet 2 02/05/2024   Bempedoic Acid-Ezetimibe (NEXLIZET) 180-10 MG TABS Take 1 tablet by mouth at bedtime. 90 tablet 3 02/05/2024 Bedtime   Biotin 1 MG CAPS Take 1 mg by mouth daily.    02/05/2024 Bedtime   carvedilol (COREG) 12.5 MG tablet TAKE 1 TABLET (12.5 MG TOTAL) BY MOUTH 2 (TWO) TIMES DAILY WITH A MEAL. KEEP OV. 180 tablet 3 02/05/2024 Bedtime   isosorbide mononitrate (IMDUR) 30 MG 24 hr tablet Take 1 tablet (30 mg total) by mouth daily. 90 tablet 3 02/03/2024   Multiple Vitamin (MULTIVITAMIN WITH MINERALS) TABS Take 1 tablet by mouth daily.   02/05/2024 Bedtime   nitroGLYCERIN (NITROSTAT) 0.4 MG SL tablet Place 1 tablet (0.4 mg total) under the tongue every 5 (five) minutes as needed for chest pain. 25 tablet 3 02/06/2024   omeprazole (PRILOSEC) 20 MG capsule Take 20 mg by mouth daily.   02/05/2024 Bedtime   ranolazine (RANEXA) 500 MG 12 hr tablet Take 1 tablet (500 mg total) by mouth 2 (two) times daily. 60 tablet 0 02/05/2024 Bedtime   umeclidinium-vilanterol (ANORO ELLIPTA) 62.5-25 MCG/ACT AEPB Inhale 1 puff into the lungs daily. 60 each 11 Past Week   valsartan (DIOVAN) 320 MG tablet TAKE 1 TABLET (320 MG TOTAL) BY MOUTH DAILY. KEEP OV. 90 tablet 3 02/05/2024 Bedtime    Family History  Problem Relation Age of Onset   Diabetes Mother    CAD Mother        CABG   Bone cancer Father    Hypertension Father    Heart disease  Brother    Kidney disease Sister    Cancer Sister    Diabetes Sister    Hyperlipidemia Sister    Hypertension Sister      Review of Systems:   ROS   Cardiac Review of Systems: Y or  [    ]= no  Chest Pain [ Y, resolved on NTG   ]  Resting SOB [ N  ] Exertional SOB  [  ]  Orthopnea [  ]   Pedal Edema [   ]    Palpitations [  ] Syncope  [  ]   Presyncope [   ]  General Review of Systems: [Y] = yes [  ]=no Constitional: recent weight change [  ]; anorexia [  ]; fatigue [ N ]; nausea [ N, resolved ]; night sweats [  ]; fever [  ]; or chills [  ]                                                               Dental: Last Dentist  visit:   Eye : blurred vision [  ]; diplopia [   ]; vision changes [  ];  Amaurosis fugax[  ]; Resp: cough [  ];  wheezing[  ];  hemoptysis[  ]; shortness of breath[ N ]; paroxysmal nocturnal dyspnea[  ]; dyspnea on exertion[N  ]; or orthopnea[  ];  GI:  gallstones[  ], vomiting[N  ];  dysphagia[  ]; melena[  ];  hematochezia [  ]; heartburn[  ];   Hx of  Colonoscopy[  ]; GU: kidney stones [  ]; hematuria[  ];   dysuria [  ];  nocturia[  ];  history of     obstruction [  ]; urinary frequency [  ]             Skin: rash, swelling[ N ];, hair loss[  ];  peripheral edema[N  ];  or itching[  ]; Musculosketetal: myalgias[  ];  joint swelling[  ];  joint erythema[  ];  joint pain[  ];  back pain[Y  ];  balloon pump right groin  Heme/Lymph: bruising[  ];  bleeding[  ];  anemia[  ];  Neuro: TIA[ N ];  headaches[  ];  stroke[N  ];  vertigo[  ];  seizures[  ];   paresthesias[  ];  difficulty walking[ N ];  Psych:depression[  ]; anxiety[  ];  Endocrine: diabetes[ N ];  thyroid dysfunction[  ];  Physical Exam: BP 111/87   Pulse 88   Temp 98 F (36.7 C) (Axillary)   Resp 18   Ht 5\' 1"  (1.549 m)   Wt 62.5 kg   SpO2 93%   BMI 26.04 kg/m   General appearance: alert, cooperative, and no distress Head: Normocephalic, without obvious abnormality, atraumatic Neck: no  adenopathy, no carotid bruit, no JVD, supple, symmetrical, trachea midline, and thyroid not enlarged, symmetric, no tenderness/mass/nodules Resp: clear to auscultation bilaterally Cardio: regular rate and rhythm GI: soft, non-tender; bowel sounds normal; no masses,  no organomegaly Extremities: extremities normal, atraumatic, no cyanosis or edema and balloon pump right groin Neurologic: Grossly normal  Diagnostic Studies & Laboratory data:     Recent Radiology Findings:   DG CHEST PORT 1 VIEW Result Date: 02/08/2024 CLINICAL DATA:  Heart failure EXAM: PORTABLE CHEST 1 VIEW COMPARISON:  02/08/2024, 02/06/2024 FINDINGS: No consolidation or effusion. Stable normal cardiomediastinal silhouette. Slightly more cranial position of intra aortic balloon pump marker, now projects over level of left pulmonary artery and is about 2.5 cm caudal to the AP window. Mild vascular congestion without overt edema. No pleural effusion or pneumothorax IMPRESSION: 1. Slightly more cranial position of intra aortic balloon pump marker compared to prior, now projects over level of left pulmonary artery and is about 2.5 cm caudal to the AP window. 2. Mild vascular congestion without overt edema. Electronically Signed   By: Jasmine Pang M.D.   On: 02/08/2024 21:01   DG Chest Port 1 View Result Date: 02/08/2024 CLINICAL DATA:  Aortic balloon pump EXAM: PORTABLE CHEST 1 VIEW COMPARISON:  X-ray 02/06/2024 FINDINGS: No consolidation, pneumothorax or effusion. Normal cardiopericardial silhouette without edema. Chronic lung changes identified. There is a marker seen overlying the mid descending thoracic aorta. With the history this could be the marker for the aortic balloon pump. Overlapping cardiac leads. Critical Value/emergent results were called by telephone at the time of interpretation on 02/08/2024 at 7:39 pm to provider [facility] , who verbally acknowledged these results. IMPRESSION: Hyperinflation with chronic lung  changes.  No pneumothorax or edema. Linear radiopaque density overlying the mid descending thoracic aorta. With the history this could be the location of the air balloon pump. Please correlate clinical findings and assess need for repositioning Electronically Signed   By: Karen Kays M.D.   On: 02/08/2024 19:53   CARDIAC CATHETERIZATION Result Date: 02/08/2024   Prox RCA to Dist RCA lesion is 100% stenosed.   Dist LAD lesion is 99% stenosed.   Prox Cx to Mid Cx lesion is 80% stenosed.   Mid Cx to Dist Cx lesion is 70% stenosed.   Dist Cx lesion is 90% stenosed. 1.  Severe multivessel disease with TIMI II flow in the distal LAD.  Due to ongoing chest pain, an intra-aortic balloon pump was placed and the patient was rendered chest pain-free. 2.  LVEDP of 13 mmHg. Recommendation: The case was discussed with Dr. Shirlee Latch who will assume care.  The patient will be transferred to the 2 heart unit and a cardiothoracic surgical consult will be obtained.   ECHOCARDIOGRAM COMPLETE Result Date: 02/08/2024    ECHOCARDIOGRAM REPORT   Patient Name:   SHAKEIA KRUS Date of Exam: 02/08/2024 Medical Rec #:  [id number]       Height:       61.0 in Accession #:    [id number]      Weight:       137.8 lb Date of Birth:  [birth year]       BSA:          1.612 m Patient Age:    72 years        BP:           149/94 mmHg Patient Gender: F               HR:           85 bpm. Exam Location:  Inpatient Procedure: 2D Echo, Cardiac Doppler and Color Doppler (Both Spectral and Color            Flow Doppler were utilized during procedure). Indications:    Chest Pain R07.9  History:        Patient has no prior history of Echocardiogram examinations.  Sonographer:    Harriette Bouillon RDCS Referring Phys: [id number] Cyndi Bender IMPRESSIONS  1. Left ventricular ejection fraction, by estimation, is 45 to 50%. Left ventricular ejection fraction by 2D MOD biplane is 49.4 %. The left ventricle has mildly decreased function. The left ventricle demonstrates  regional wall motion abnormalities (see  scoring diagram/findings for description). Left ventricular diastolic parameters are consistent with Grade I diastolic dysfunction (impaired relaxation). There is severe hypokinesis of the left ventricular, mid-apical anteroseptal wall, apical segment and anterior wall. The base of the heart is hyperdynamic. Consider LAD territory disease vs. Takatsubo cardiomyopathy.  2. Right ventricular systolic function is normal. The right ventricular size is normal.  3. The mitral valve is grossly normal. Trivial mitral valve regurgitation.  4. The aortic valve is tricuspid. Aortic valve regurgitation is not visualized. No aortic stenosis is present. Comparison(s): No prior Echocardiogram. Conclusion(s)/Recommendation(s): Findings suggest LAD territory ischemia -probably mid-distal disease vs possibly Takatsubo cardiomyopathy. FINDINGS  Left Ventricle: Left ventricular ejection fraction, by estimation, is 45 to 50%. Left ventricular ejection fraction by 2D MOD biplane is 49.4 %. The left ventricle has mildly decreased function. The left ventricle demonstrates regional wall motion abnormalities. Severe hypokinesis of the left ventricular, mid-apical anteroseptal wall, apical segment and anterior wall. Strain imaging was not performed. The left  ventricular internal cavity size was normal in size. There is no left ventricular hypertrophy. Left ventricular diastolic parameters are consistent with Grade I diastolic dysfunction (impaired relaxation). Normal left ventricular filling pressure.  LV Wall Scoring: The mid and distal lateral wall, mid and distal anterior septum, and entire apex are hypokinetic. The base of the heart is hyperdynamic. Consider LAD territory disease vs. Takatsubo cardiomyopathy. Right Ventricle: The right ventricular size is normal. No increase in right ventricular wall thickness. Right ventricular systolic function is normal. Left Atrium: Left atrial size was normal  in size. Right Atrium: Right atrial size was normal in size. Pericardium: There is no evidence of pericardial effusion. Mitral Valve: The mitral valve is grossly normal. Trivial mitral valve regurgitation. Tricuspid Valve: The tricuspid valve is grossly normal. Tricuspid valve regurgitation is trivial. Aortic Valve: The aortic valve is tricuspid. Aortic valve regurgitation is not visualized. No aortic stenosis is present. Pulmonic Valve: The pulmonic valve was not well visualized. Pulmonic valve regurgitation is not visualized. Aorta: The aortic root and ascending aorta are structurally normal, with no evidence of dilitation. Venous: The inferior vena cava was not well visualized. IAS/Shunts: No atrial level shunt detected by color flow Doppler. Additional Comments: 3D imaging was not performed.  LEFT VENTRICLE PLAX 2D                        Biplane EF (MOD) LVIDd:         3.70 cm         LV Biplane EF:   Left LVIDs:         2.50 cm                          ventricular LV PW:         1.00 cm                          ejection LV IVS:        1.00 cm                          fraction by LVOT diam:     1.90 cm                          2D MOD LV SV:         69                               biplane is LV SV Index:   43                               49.4 %. LVOT Area:     2.84 cm                                Diastology                                LV e' medial:    3.81 cm/s LV Volumes (MOD)               LV E/e' medial:  8.8 LV  vol d, MOD    59.4 ml       LV e' lateral:   6.42 cm/s A2C:                           LV E/e' lateral: 5.2 LV vol d, MOD    57.9 ml A4C: LV vol s, MOD    34.4 ml A2C: LV vol s, MOD    29.2 ml A4C: LV SV MOD A2C:   25.0 ml LV SV MOD A4C:   57.9 ml LV SV MOD BP:    30.2 ml RIGHT VENTRICLE RV S prime:     11.00 cm/s TAPSE (M-mode): 1.4 cm LEFT ATRIUM             Index        RIGHT ATRIUM           Index LA diam:        3.10 cm 1.92 cm/m   RA Area:     10.60 cm LA Vol (A2C):   32.6 ml 20.22 ml/m   RA Volume:   21.00 ml  13.03 ml/m LA Vol (A4C):   33.6 ml 20.84 ml/m LA Biplane Vol: 34.0 ml 21.09 ml/m  AORTIC VALVE LVOT Vmax:   121.00 cm/s LVOT Vmean:  72.200 cm/s LVOT VTI:    0.245 m  AORTA Ao Root diam: 2.70 cm Ao Asc diam:  2.90 cm MITRAL VALVE MV Area (PHT): 5.00 cm    SHUNTS MV E velocity: 33.40 cm/s  Systemic VTI:  0.24 m MV A velocity: 68.60 cm/s  Systemic Diam: 1.90 cm MV E/A ratio:  0.49 Zoila Shutter MD Electronically signed by Zoila Shutter MD Signature Date/Time: 02/08/2024/3:44:39 PM    Final    DG ESOPHAGUS W SINGLE CM (SOL OR THIN BA) Result Date: 02/07/2024 CLINICAL DATA:  73 year old female with history of esophageal stricture, possible microperforation. Request for esophagram. EXAM: ESOPHAGUS/BARIUM SWALLOW/TABLET STUDY TECHNIQUE: Single contrast examination was performed using 110 cc of Omnipaque 300. This exam was performed by Buzzy Han, PA-C, and was supervised and interpreted by Dr. Reche Dixon. FLUOROSCOPY: Radiation Exposure Index (as provided by the fluoroscopic device): 21.9 mGy Kerma COMPARISON:  None Available. FINDINGS: Swallowing: Appears normal. Trace penetration seen on initial images. No aspiration seen. Esophagus: Normal appearance. No contrast extravasation noted. No persistent narrowing to suggest stricture. Gastroesophageal reflux: None visualized. Ingested 13mm barium tablet: Passed normally. IMPRESSION: 1. Trace penetration without aspiration. 2.  No esophageal contrast extravasation to suggest perforation. 3.  No esophageal stricture. Procedure performed by Buzzy Han, PA-C. Exam supervised and interpreted by Jeronimo Greaves, M.D. Electronically Signed   By: Jeronimo Greaves M.D.   On: 02/07/2024 16:58     I have independently reviewed the above radiologic studies and discussed with the patient   Recent Lab Findings: Lab Results  Component Value Date   WBC 10.3 02/09/2024   HGB 15.1 (H) 02/09/2024   HCT 46.1 (H) 02/09/2024   PLT 206 02/09/2024   GLUCOSE 116  (H) 02/09/2024   CHOL 181 02/06/2024   TRIG 137 02/06/2024   HDL 50 02/06/2024   LDLDIRECT 82 04/26/2023   LDLCALC 104 (H) 02/06/2024   ALT 13 02/06/2024   AST 19 02/06/2024   NA 137 02/09/2024   K 4.2 02/09/2024   CL 100 02/09/2024   CREATININE 0.96 02/09/2024   BUN 10 02/09/2024   CO2 23 02/09/2024   TSH 0.627 01/31/2020   INR 1.1 02/07/2024  HGBA1C 6.2 (H) 02/07/2024    Assessment / Plan:      Known CAD, previously placed stents in 2014, admitted with chest pain.. cath showing multivessel CAD with recurrent chest pain post procedure requiring balloon pump placement... currently pain free on Heparin and NTG drip HTN HLD Nicotine abuse- quit 3 weeks ago Abdominal pain, distention- chronic..Gastroenterology does not feel further workup was indicated at this time   Patient is pain free on Heparin and NTG drip... Overall she does not wish to have a sternotomy.  She is also unsure of blood products due to previous experience with this.  I explained to patient the process of ensuring products match for her.  I spoke with Dr. Shirlee Latch who stated surgery is her best option.  However, stents are possible but would not be ideal.  Dr. Dorris Fetch to evaluate patient.    I  spent 55 minutes counseling the patient face to face.   Lowella Dandy, PA-C 02/09/2024 10:19 AM  I have seen and examined Meghan Lynn, I reviewed her cath images.    73 yo with known CAD and multiple CRF including tobacco use, hypertension, hyperlipidemia and a strong family history.  DES x 3 to RCA in 2014.  Presents with unstable CP and also abdominal pain.  W/u revealed severe 3 vessel CAD with EF 40%.  IABP placed for refractory CP.  No Cp since cath.  GI w/u showed some upper abdominal edema but nothing requiring acute intervention.  CABG indicated for survival benefit and relief of symptoms.  I discussed the general nature of the procedure, including the need for general anesthesia, the incisions to be  used, the use of cardiopulmonary bypass, and the use of temporary pacemaker wires and drainage tubes postoperatively with Meghan Lynn and her youngest daughter.  We discussed the expected hospital stay, overall recovery and short and long term outcomes. I informed her of the indications, risks, benefits and alternatives.   She understands the risks include, but are not limited to death, stroke, MI, DVT/PE, bleeding, possible need for transfusion, infections, cardiac arrhythmias, as well as other organ system dysfunction including respiratory, renal, or GI complications.   She understands and accepts the risks and agrees to proceed. She agrees to transfusion if necessary.  For CABG tomorrow AM  Salvatore Decent. Dorris Fetch, MD [facility] [phone]

## 2024-02-09 NOTE — Progress Notes (Signed)
PHARMACY - ANTICOAGULATION CONSULT NOTE  Pharmacy Consult for heparin Indication:  IABP  Labs: Recent Labs    02/06/24 1052 02/06/24 1259 02/06/24 2145 02/07/24 0548 02/07/24 0750 02/07/24 1216 02/07/24 1933 02/08/24 0504 02/09/24 0550  HGB 17.4*  --   --  15.7*  --   --   --  16.3* 15.1*  HCT 53.4*  --   --  49.0*  --   --   --  48.9* 46.1*  PLT 218  --   --  222  --   --   --  237 206  APTT  --   --   --  77*  --   --   --   --   --   LABPROT  --   --   --  14.2  --   --   --   --   --   INR  --   --   --  1.1  --   --   --   --   --   HEPARINUNFRC  --   --    < >  --    < > 0.67 0.36  --  0.36  CREATININE 0.73  --   --  0.73  --   --   --  0.82  --   TROPONINIHS 196* 181*  --  762*  --   --   --  595*  --    < > = values in this interval not displayed.   Assessment/Plan:  73yo female therapeutic on heparin after resumed post-cath. Will continue infusion at current rate of 950 units/hr and confirm stable with additional level.  Vernard Gambles, PharmD, BCPS 02/09/2024 6:23 AM

## 2024-02-09 NOTE — Care Plan (Signed)
Patient transferred to [facility] unit which is closed unit for hospitalist.  Cardiology primary now.  Hospitalist signing off.

## 2024-02-09 NOTE — Progress Notes (Signed)
Pre-CABG testing has been completed. Preliminary results can be found in CV Proc through chart review.   02/09/24 12:40 PM Olen Cordial RVT

## 2024-02-09 NOTE — Progress Notes (Signed)
PHARMACY - ANTICOAGULATION CONSULT NOTE  Pharmacy Consult for heparin  Indication: chest pain/ACS  Allergies  Allergen Reactions   Atorvastatin Other (See Comments)    Muscle cramping   Latex Itching   Propoxyphene Nausea And Vomiting    Darvocet    Patient Measurements: Height: 5\' 1"  (154.9 cm) Weight: 62.5 kg (137 lb 12.8 oz) IBW/kg (Calculated) : 47.8 Heparin Dosing Weight: 63.5 kg   Vital Signs: Temp: 97.7 F (36.5 C) (02/13 1118) Temp Source: Oral (02/13 1118) BP: 125/65 (02/13 1145) Pulse Rate: 223 (02/13 1130)  Labs: Recent Labs    02/06/24 1259 02/06/24 2145 02/07/24 0548 02/07/24 0750 02/07/24 1933 02/08/24 0504 02/09/24 0550 02/09/24 1127  HGB  --    < > 15.7*  --   --  16.3* 15.1*  --   HCT  --   --  49.0*  --   --  48.9* 46.1*  --   PLT  --   --  222  --   --  237 206  --   APTT  --   --  77*  --   --   --   --   --   LABPROT  --   --  14.2  --   --   --   --   --   INR  --   --  1.1  --   --   --   --   --   HEPARINUNFRC  --    < >  --    < > 0.36  --  0.36 0.38  CREATININE  --   --  0.73  --   --  0.82 0.96  --   TROPONINIHS 181*  --  762*  --   --  595*  --   --    < > = values in this interval not displayed.    Estimated Creatinine Clearance: 44.2 mL/min (by C-G formula based on SCr of 0.96 mg/dL).   Medical History: 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.0)    cluster every day   Headache, migraine    daily   Hyperlipidemia    Hypertension    Peptic ulcer disease    Pneumonia    Shortness of breath dyspnea    with exertion    Assessment: 73 yo F with chest pain. Pharmacy consulted to dose heparin drip.  No anticoagulants PTA.   Pt s/p LHC  w/ IABP placed for ongoing CP. Heparin resumed postop. CABG evaluation ongoing.  Heparin level remains therapeutic on recheck, no bleeding issues.  Goal of Therapy:  Heparin level 0.3-0.5 units/ml Monitor platelets by anticoagulation protocol: Yes   Plan:  -Continue heparin 950 units/h -Daily heparin level, CBC  Fredonia Highland, PharmD, [place], [facility] Clinical Pharmacist [phone] Please check AMION for all [facility] Pharmacy numbers 02/09/2024

## 2024-02-09 NOTE — Progress Notes (Addendum)
Advanced Heart Failure Rounding Note  Cardiologist: Chrystie Nose, MD   Chief Complaint: NSTEMI  Subjective:    IABP at 1:1 and nitroglycerin gtt at 70 mcg/min (has been titrated for SBP).   No recurrent chest pain overnight. Reports back pain from lying flat.   Having trouble voiding flat in bed.   Objective:   Weight Range: 62.5 kg Body mass index is 26.04 kg/m.   Vital Signs:   Temp:  [97.8 F (36.6 C)-98.6 F (37 C)] 98 F (36.7 C) (02/13 0748) Pulse Rate:  [70-269] 88 (02/13 0732) Resp:  [13-25] 18 (02/13 0645) BP: (96-149)/(54-123) 126/76 (02/13 0732) SpO2:  [90 %-97 %] 92 % (02/13 0645) Arterial Line BP: (82)/(52) 82/52 (02/12 2127) Last BM Date : 02/05/24  Weight change: Filed Weights   02/06/24 1023 02/07/24 0810 02/07/24 1300  Weight: 63.5 kg 65.8 kg 62.5 kg    Intake/Output:   Intake/Output Summary (Last 24 hours) at 02/09/2024 0801 Last data filed at 02/09/2024 0600 Gross per 24 hour  Intake 1907.23 ml  Output 600 ml  Net 1307.23 ml      Physical Exam    General:  Well appearing elderly female Neck: JVP not elevated Cor: Regular rate & rhythm. No rubs, gallops or murmurs. Lungs: Clear Abdomen: Soft, nontender, nondistended.  Extremities: No cyanosis, clubbing, rash, edema, R groin IABP access site stable, good R radial pulse w/ no hematoma Neuro: Alert & orientedx3. Affect pleasant   Telemetry   SR 80s  Labs    CBC Recent Labs    02/06/24 1052 02/07/24 0548 02/08/24 0504 02/09/24 0550  WBC 11.3*   < > 11.1* 10.3  NEUTROABS 9.3*  --   --   --   HGB 17.4*   < > 16.3* 15.1*  HCT 53.4*   < > 48.9* 46.1*  MCV 79.3*   < > 77.1* 78.0*  PLT 218   < > 237 206   < > = values in this interval not displayed.   Basic Metabolic Panel Recent Labs    16/10/96 0504 02/08/24 1453 02/09/24 0550  NA 134*  --  137  K 2.8* 3.8 4.2  CL 96*  --  100  CO2 24  --  23  GLUCOSE 118*  --  116*  BUN 7*  --  10  CREATININE 0.82  --   0.96  CALCIUM 9.2  --  9.0  MG 1.7  --  2.4   Liver Function Tests Recent Labs    02/06/24 1052  AST 19  ALT 13  ALKPHOS 69  BILITOT 0.9  PROT 8.2*  ALBUMIN 4.0   Recent Labs    02/06/24 1052 02/08/24 0531  LIPASE 26 21   Cardiac Enzymes No results for input(s): "CKTOTAL", "CKMB", "CKMBINDEX", "TROPONINI" in the last 72 hours.  BNP: BNP (last 3 results) No results for input(s): "BNP" in the last 8760 hours.  ProBNP (last 3 results) No results for input(s): "PROBNP" in the last 8760 hours.   D-Dimer No results for input(s): "DDIMER" in the last 72 hours. Hemoglobin A1C Recent Labs    02/07/24 0548  HGBA1C 6.2*   Fasting Lipid Panel No results for input(s): "CHOL", "HDL", "LDLCALC", "TRIG", "CHOLHDL", "LDLDIRECT" in the last 72 hours. Thyroid Function Tests No results for input(s): "TSH", "T4TOTAL", "T3FREE", "THYROIDAB" in the last 72 hours.  Invalid input(s): "FREET3"  Other results:   Imaging    DG CHEST PORT 1 VIEW Result Date: 02/08/2024 CLINICAL  DATA:  Heart failure EXAM: PORTABLE CHEST 1 VIEW COMPARISON:  02/08/2024, 02/06/2024 FINDINGS: No consolidation or effusion. Stable normal cardiomediastinal silhouette. Slightly more cranial position of intra aortic balloon pump marker, now projects over level of left pulmonary artery and is about 2.5 cm caudal to the AP window. Mild vascular congestion without overt edema. No pleural effusion or pneumothorax IMPRESSION: 1. Slightly more cranial position of intra aortic balloon pump marker compared to prior, now projects over level of left pulmonary artery and is about 2.5 cm caudal to the AP window. 2. Mild vascular congestion without overt edema. Electronically Signed   By: Jasmine Pang M.D.   On: 02/08/2024 21:01   DG Chest Port 1 View Result Date: 02/08/2024 CLINICAL DATA:  Aortic balloon pump EXAM: PORTABLE CHEST 1 VIEW COMPARISON:  X-ray 02/06/2024 FINDINGS: No consolidation, pneumothorax or effusion. Normal  cardiopericardial silhouette without edema. Chronic lung changes identified. There is a marker seen overlying the mid descending thoracic aorta. With the history this could be the marker for the aortic balloon pump. Overlapping cardiac leads. Critical Value/emergent results were called by telephone at the time of interpretation on 02/08/2024 at 7:39 pm to provider [facility] , who verbally acknowledged these results. IMPRESSION: Hyperinflation with chronic lung changes.  No pneumothorax or edema. Linear radiopaque density overlying the mid descending thoracic aorta. With the history this could be the location of the air balloon pump. Please correlate clinical findings and assess need for repositioning Electronically Signed   By: Karen Kays M.D.   On: 02/08/2024 19:53   CARDIAC CATHETERIZATION Result Date: 02/08/2024   Prox RCA to Dist RCA lesion is 100% stenosed.   Dist LAD lesion is 99% stenosed.   Prox Cx to Mid Cx lesion is 80% stenosed.   Mid Cx to Dist Cx lesion is 70% stenosed.   Dist Cx lesion is 90% stenosed. 1.  Severe multivessel disease with TIMI II flow in the distal LAD.  Due to ongoing chest pain, an intra-aortic balloon pump was placed and the patient was rendered chest pain-free. 2.  LVEDP of 13 mmHg. Recommendation: The case was discussed with Dr. Shirlee Latch who will assume care.  The patient will be transferred to the 2 heart unit and a cardiothoracic surgical consult will be obtained.   ECHOCARDIOGRAM COMPLETE Result Date: 02/08/2024    ECHOCARDIOGRAM REPORT   Patient Name:   Meghan Lynn Date of Exam: 02/08/2024 Medical Rec #:  [id number]       Height:       61.0 in Accession #:    [id number]      Weight:       137.8 lb Date of Birth:  Mar 29, 1951       BSA:          1.612 m Patient Age:    73 years        BP:           149/94 mmHg Patient Gender: F               HR:           85 bpm. Exam Location:  Inpatient Procedure: 2D Echo, Cardiac Doppler and Color Doppler (Both Spectral and Color             Flow Doppler were utilized during procedure). Indications:    Chest Pain R07.9  History:        Patient has no prior history of Echocardiogram examinations.  Sonographer:    Leotis Shames  Aundria Rud RDCS Referring Phys: [id number] Cyndi Bender IMPRESSIONS  1. Left ventricular ejection fraction, by estimation, is 45 to 50%. Left ventricular ejection fraction by 2D MOD biplane is 49.4 %. The left ventricle has mildly decreased function. The left ventricle demonstrates regional wall motion abnormalities (see  scoring diagram/findings for description). Left ventricular diastolic parameters are consistent with Grade I diastolic dysfunction (impaired relaxation). There is severe hypokinesis of the left ventricular, mid-apical anteroseptal wall, apical segment and anterior wall. The base of the heart is hyperdynamic. Consider LAD territory disease vs. Takatsubo cardiomyopathy.  2. Right ventricular systolic function is normal. The right ventricular size is normal.  3. The mitral valve is grossly normal. Trivial mitral valve regurgitation.  4. The aortic valve is tricuspid. Aortic valve regurgitation is not visualized. No aortic stenosis is present. Comparison(s): No prior Echocardiogram. Conclusion(s)/Recommendation(s): Findings suggest LAD territory ischemia -probably mid-distal disease vs possibly Takatsubo cardiomyopathy. FINDINGS  Left Ventricle: Left ventricular ejection fraction, by estimation, is 45 to 50%. Left ventricular ejection fraction by 2D MOD biplane is 49.4 %. The left ventricle has mildly decreased function. The left ventricle demonstrates regional wall motion abnormalities. Severe hypokinesis of the left ventricular, mid-apical anteroseptal wall, apical segment and anterior wall. Strain imaging was not performed. The left ventricular internal cavity size was normal in size. There is no left ventricular hypertrophy. Left ventricular diastolic parameters are consistent with Grade I diastolic dysfunction (impaired  relaxation). Normal left ventricular filling pressure.  LV Wall Scoring: The mid and distal lateral wall, mid and distal anterior septum, and entire apex are hypokinetic. The base of the heart is hyperdynamic. Consider LAD territory disease vs. Takatsubo cardiomyopathy. Right Ventricle: The right ventricular size is normal. No increase in right ventricular wall thickness. Right ventricular systolic function is normal. Left Atrium: Left atrial size was normal in size. Right Atrium: Right atrial size was normal in size. Pericardium: There is no evidence of pericardial effusion. Mitral Valve: The mitral valve is grossly normal. Trivial mitral valve regurgitation. Tricuspid Valve: The tricuspid valve is grossly normal. Tricuspid valve regurgitation is trivial. Aortic Valve: The aortic valve is tricuspid. Aortic valve regurgitation is not visualized. No aortic stenosis is present. Pulmonic Valve: The pulmonic valve was not well visualized. Pulmonic valve regurgitation is not visualized. Aorta: The aortic root and ascending aorta are structurally normal, with no evidence of dilitation. Venous: The inferior vena cava was not well visualized. IAS/Shunts: No atrial level shunt detected by color flow Doppler. Additional Comments: 3D imaging was not performed.  LEFT VENTRICLE PLAX 2D                        Biplane EF (MOD) LVIDd:         3.70 cm         LV Biplane EF:   Left LVIDs:         2.50 cm                          ventricular LV PW:         1.00 cm                          ejection LV IVS:        1.00 cm                          fraction by LVOT  diam:     1.90 cm                          2D MOD LV SV:         69                               biplane is LV SV Index:   43                               49.4 %. LVOT Area:     2.84 cm                                Diastology                                LV e' medial:    3.81 cm/s LV Volumes (MOD)               LV E/e' medial:  8.8 LV vol d, MOD    59.4 ml       LV e'  lateral:   6.42 cm/s A2C:                           LV E/e' lateral: 5.2 LV vol d, MOD    57.9 ml A4C: LV vol s, MOD    34.4 ml A2C: LV vol s, MOD    29.2 ml A4C: LV SV MOD A2C:   25.0 ml LV SV MOD A4C:   57.9 ml LV SV MOD BP:    30.2 ml RIGHT VENTRICLE RV S prime:     11.00 cm/s TAPSE (M-mode): 1.4 cm LEFT ATRIUM             Index        RIGHT ATRIUM           Index LA diam:        3.10 cm 1.92 cm/m   RA Area:     10.60 cm LA Vol (A2C):   32.6 ml 20.22 ml/m  RA Volume:   21.00 ml  13.03 ml/m LA Vol (A4C):   33.6 ml 20.84 ml/m LA Biplane Vol: 34.0 ml 21.09 ml/m  AORTIC VALVE LVOT Vmax:   121.00 cm/s LVOT Vmean:  72.200 cm/s LVOT VTI:    0.245 m  AORTA Ao Root diam: 2.70 cm Ao Asc diam:  2.90 cm MITRAL VALVE MV Area (PHT): 5.00 cm    SHUNTS MV E velocity: 33.40 cm/s  Systemic VTI:  0.24 m MV A velocity: 68.60 cm/s  Systemic Diam: 1.90 cm MV E/A ratio:  0.49 Zoila Shutter MD Electronically signed by Zoila Shutter MD Signature Date/Time: 02/08/2024/3:44:39 PM    Final      Medications:     Scheduled Medications:  carvedilol  25 mg Oral BID WC   Chlorhexidine Gluconate Cloth  6 each Topical Daily   ezetimibe  10 mg Oral Daily   isosorbide mononitrate  60 mg Oral Daily   losartan  25 mg Oral QHS   pantoprazole (PROTONIX) IV  40 mg Intravenous Q12H   potassium chloride  20 mEq Oral BID   ranolazine  500  mg Oral BID   sodium chloride flush  3 mL Intravenous Q12H   sodium chloride flush  3 mL Intravenous Q12H   umeclidinium-vilanterol  1 puff Inhalation Daily    Infusions:  sodium chloride     cefTRIAXone (ROCEPHIN)  IV Stopped (02/08/24 1907)   heparin 950 Units/hr (02/09/24 0600)   metronidazole 500 mg (02/09/24 1610)   nitroGLYCERIN 65 mcg/min (02/09/24 0600)    PRN Medications: sodium chloride, acetaminophen, albuterol, hydrALAZINE, labetalol, morphine injection, ondansetron (ZOFRAN) IV, polyethylene glycol    Patient Profile   73 y.o. female with history of CAD, COPD, tobacco  use, HLD, dysphagia. Admitted with NSTEMI. Found to have multivessel CAD on cath. IABP placed d/t ongoing angina.   Assessment/Plan   1. CAD: NSTEMI this admission, peak HS-TnI 762.  No significant ECG changes.  Patient has history of DES x 3 to RCA in 2014.  Coronary angiography 02/12 shows totally occluded RCA with collaterals, 80% proximal and 70% mid LCx stenosis, 90% distal LCx, 99% mid/distal LAD stenosis. CAD was thought most amenable to CABG.  IABP was placed for refractory angina, subsequent resolution.  - TCTS consult this am for consideration of CABG - IABP at 1:1 for anginal relief. On heparin gtt for IABP. Platelets stable. - Continue current Imdur and ranolazine. Increase imdur to 90 mg daily. Wean nitroglycerin gtt as able. - Would continue current Coreg 25 mg bid.  - Patient is unable to tolerate statins, she is currently on Zetia.  She will need a PCSK9 inhibitor after discharge.  2. Ischemic cardiomyopathy: I reviewed today's echo, this showed EF 45% with severe HK to AK of the mid-apical anteroseptal wall, the apical wall segments, and the true apex.  The basal segments were hyperkinetic and there was evidence for a related outflow gradient.  LVEDP only 13 on cath. She is not volume overloaded on exam. IABP was placed in cath lab today not for hemodynamics but for angina control.   - Will need to watch carefully with IABP in place, theoretically the drop in afterload could worsen her LVOT gradient in setting of hyperdynamic LV basal segment function.  - Would continue full dose Coreg 25 mg bid with LVOT gradient and hyperdynamic LV basal segments.  - Transitioned to lower-dose ARB, using losartan 25 mg daily rather than irbesartan 300 mg daily.  - Hold lasix and amlodipine 3. Dysphagia: Barium swallow showed no stricture.  4. COPD: Active smoker.  Counseled cessation. Continue Anoro inhaler.  5. Abdominal pain: No evidence for pancreatitis with normal lipase x 2.  GI has signed  off.  6. ID: Blood cultures negative, WBCs 10.3.  Afebrile.  She has been on ceftriaxone/Flagyl, this appears to have been started for ?abdominal source.  - No clear indication for antibiotics. Will stop today. 7. ? Urinary retention: Having trouble voiding lying flat in bed. Check bladder scan, may need in and out cath.  CRITICAL CARE Performed by: Anna Genre N   Total critical care time: 20 minutes  Critical care time was exclusive of separately billable procedures and treating other patients.  Critical care was necessary to treat or prevent imminent or life-threatening deterioration.  Critical care was time spent personally by me on the following activities: development of treatment plan with patient and/or surrogate as well as nursing, discussions with consultants, evaluation of patient's response to treatment, examination of patient, obtaining history from patient or surrogate, ordering and performing treatments and interventions, ordering and review of laboratory studies, ordering and review  of radiographic studies, pulse oximetry and re-evaluation of patient's condition.   Length of Stay: 3  FINCH, LINDSAY N, PA-C  02/09/2024, 8:01 AM  Advanced Heart Failure Team Pager [phone] (M-F; 7a - 5p)  Please contact [facility] for night-coverage after hours (5p -7a ) and weekends on [email or web address]   Patient seen with PA, I formulated the plan and agree with the above note.   Patient's chest pain has resolved with medical management and IABP.  She is stable hemodynamically this morning.  IABP continues at 1:1.    General: NAD Neck: No JVD, no thyromegaly or thyroid nodule.  Lungs: Clear to auscultation bilaterally with normal respiratory effort. CV: Nondisplaced PMI.  Heart regular S1/S2, no S3/S4, 1/6 SEM RUSB.  No peripheral edema.  Abdomen: Soft, nontender, no hepatosplenomegaly, no distention.  Skin: Intact without lesions or rashes.  Neurologic: Alert and oriented x 3.   Psych: Normal affect. Extremities: IABP right groin HEENT: Normal.   Volume status looks ok, IABP for angina and not CHF.  Does not need diuretic.  CBC stable with IABP.   She can stop antibiotics.  I discussed with hospitalist, no clear indication.   Long discussion with patient about revascularization options.  I think she would be best served with CABG.  She is willing to have surgery done.  TCTS has seen, tentative plan for surgery tomorrow.   CRITICAL CARE Performed by: Marca Ancona  Total critical care time: 35 minutes  Critical care time was exclusive of separately billable procedures and treating other patients.  Critical care was necessary to treat or prevent imminent or life-threatening deterioration.  Critical care was time spent personally by me on the following activities: development of treatment plan with patient and/or surrogate as well as nursing, discussions with consultants, evaluation of patient's response to treatment, examination of patient, obtaining history from patient or surrogate, ordering and performing treatments and interventions, ordering and review of laboratory studies, ordering and review of radiographic studies, pulse oximetry and re-evaluation of patient's condition.  Marca Ancona 02/09/2024 12:24 PM

## 2024-02-09 NOTE — Consult Note (Addendum)
 [address]       [place] [address]             [phone]        SHERISA GILVIN [facility] Medical Record #[id number] Date of Birth: 08-Nov-1951  Referring: [facility] Primary Care: Patient, No Pcp Per Primary Cardiologist:Kenneth Alona Bene, MD  Chief Complaint:    Chief Complaint  Patient presents with   Chest Pain   History of Present Illness:      Daniyah Fohl is a 73 yo AA female with history of GERD, Esophageal Stricture with recent failed dilation attempts, PUD, HTN, Dyslipidemia, Nicotine Abuse, and known CAD S/p multiple stents placement in the past.  She developed complaints of chest pain with radiation down into her arm.  She originally presented to the ED on 02/01/2024.  She felt her wait was too long and she left prior to being seen.  She contacted her Cardiologist office and was evaluated by Jari Favre PA-C whom recommended addition of Ranexa for symptom control.  She also felt patient undergo Myoview stress test.  Unfortunately she was unable to afford Ranexa and it was never taken.  She again presented to the ED on 02/06/2024 with complaints of severe stomach pain, dizziness, mild chest pain that developed several hours prior to presentation.  She took 3 NTG with relief of pain. She was also experiencing shortness of breath, cough, N/V, abdominal pain and headache.  EKG obtained showed no ST elevation, but she did have elevated Troponin levels.  CT scan of her abdomen showed some free fluid in the upper abdomen.  It was felt she would require further care and she was transferred to [facility] for further care.  Upon arrival to [facility] Cardiology consult was obtained who recommended Echocardiogram and cardiac catheterization.  GI consult was obtained, barium swallow showed no evidence of esophageal injury.  He felt that endoscopy would not be indicated.  He felt she could be managed conservatively and they signed off. Echocardiogram was obtained and  showed mildly reduced EF and no valvular abnormalities.  Cardiac catheterization was obtained and revealed severe multivessel CAD.  She had ongoing chest pain post procedure and IABP was placed.  She was admitted to the ICU and Cardiothoracic surgery consultation was requested.  The patient is currently chest pain free on Heparin and NTG drip.  She states her biggest complaint right now is back pain, due to her previous back surgeries.  She is a former smoke of 1 ppd having quit weeks ago.  The patient continues to work and is very active.  She has no family history of CAD.  The patient does not wish to have a sternotomy done.  She also unsure if she is willing to take blood products due to issues in past.  Current Activity/ Functional Status: Patient is independent with mobility/ambulation, transfers, ADL's, IADL's.   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.0)    cluster every day   Headache, migraine    daily   Hyperlipidemia    Hypertension  Peptic ulcer disease    Pneumonia    Shortness of breath dyspnea    with exertion    Past Surgical History:  Procedure Laterality Date   BACK SURGERY     BALLOON DILATION N/A 05/30/2013   Procedure: BALLOON DILATION;  Surgeon: Barrie Folk, MD;  Location: [facility] ENDOSCOPY;  Service: Endoscopy;  Laterality: N/A;   BALLOON DILATION N/A 08/02/2013   Procedure: BALLOON DILATION;  Surgeon: Barrie Folk, MD;  Location: [facility] ENDOSCOPY;  Service: Endoscopy;  Laterality: N/A;   BALLOON DILATION N/A 04/29/2016   Procedure: BALLOON DILATION;  Surgeon: Dorena Cookey, MD;  Location: [facility] ENDOSCOPY;  Service: Endoscopy;  Laterality: N/A;   BALLOON DILATION N/A 06/16/2020   Procedure:  BALLOON DILATION;  Surgeon: Kerin Salen, MD;  Location: [facility] ENDOSCOPY;  Service: Gastroenterology;  Laterality: N/A;   BIOPSY  06/16/2020   Procedure: BIOPSY;  Surgeon: Kerin Salen, MD;  Location: [facility] ENDOSCOPY;  Service: Gastroenterology;;   CARDIAC CATHETERIZATION  03/07/2002   moderte CAD 80% (mid RCA) - Dr. Chanda Busing   CARDIAC CATHETERIZATION  09/13/2003   Cypher 2.5x79mm and 2.5x18 and Taxus 2.75x20mm to distal, mid, prox RCA (Dr. Laurell Josephs)   CARDIOLITE MYOCARDIAL PERFUSION STUDY  05/2006   positive bruce protocol, low risk, EF 80%   CARDIOVASCULAR STRESS TEST  2010   COLONOSCOPY     COLONOSCOPY WITH PROPOFOL N/A 06/16/2020   Procedure: COLONOSCOPY WITH PROPOFOL;  Surgeon: Kerin Salen, MD;  Location: [facility] ENDOSCOPY;  Service: Gastroenterology;  Laterality: N/A;   COLONOSCOPY WITH PROPOFOL N/A 06/21/2020   Procedure: COLONOSCOPY WITH PROPOFOL;  Surgeon: Kathi Der, MD;  Location: [facility] ENDOSCOPY;  Service: Gastroenterology;  Laterality: N/A;   CORONARY ANGIOPLASTY  2004   stents x 3   DILATATION & CURETTAGE/HYSTEROSCOPY WITH MYOSURE N/A 08/19/2016   Procedure: DILATATION & CURETTAGE/HYSTEROSCOPY WITH MYOSURE;  Surgeon: Maxie Better, MD;  Location: [facility] ORS;  Service: Gynecology;  Laterality: N/A;   ESOPHAGOGASTRODUODENOSCOPY N/A 05/30/2013   Procedure: ESOPHAGOGASTRODUODENOSCOPY (EGD);  Surgeon: Barrie Folk, MD;  Location: [facility] ENDOSCOPY;  Service: Endoscopy;  Laterality: N/A;   ESOPHAGOGASTRODUODENOSCOPY N/A 08/02/2013   Procedure: ESOPHAGOGASTRODUODENOSCOPY (EGD);  Surgeon: Barrie Folk, MD;  Location: [facility] ENDOSCOPY;  Service: Endoscopy;  Laterality: N/A;  barb /ja   ESOPHAGOGASTRODUODENOSCOPY (EGD) WITH PROPOFOL N/A 04/29/2016   Procedure: ESOPHAGOGASTRODUODENOSCOPY (EGD) WITH PROPOFOL;  Surgeon: Dorena Cookey, MD;  Location: [facility] ENDOSCOPY;  Service: Endoscopy;  Laterality: N/A;   ESOPHAGOGASTRODUODENOSCOPY (EGD) WITH PROPOFOL N/A 06/16/2020   Procedure: ESOPHAGOGASTRODUODENOSCOPY (EGD) WITH  PROPOFOL;  Surgeon: Kerin Salen, MD;  Location: [facility] ENDOSCOPY;  Service: Gastroenterology;  Laterality: N/A;   FRACTURE SURGERY     HEMOSTASIS CLIP PLACEMENT  06/21/2020   Procedure: HEMOSTASIS CLIP PLACEMENT;  Surgeon: Kathi Der, MD;  Location: [facility] ENDOSCOPY;  Service: Gastroenterology;;   IABP INSERTION Right 02/08/2024   Procedure: IABP Insertion;  Surgeon: Orbie Pyo, MD;  Location: [facility] INVASIVE CV LAB;  Service: Cardiovascular;  Laterality: Right;   LEFT HEART CATH AND CORONARY ANGIOGRAPHY N/A 02/08/2024   Procedure: LEFT HEART CATH AND CORONARY ANGIOGRAPHY;  Surgeon: Orbie Pyo, MD;  Location: [facility] INVASIVE CV LAB;  Service: Cardiovascular;  Laterality: N/A;   ORIF ELBOW FRACTURE  2006   POLYPECTOMY  06/16/2020   Procedure: POLYPECTOMY;  Surgeon: Kerin Salen, MD;  Location: [facility] ENDOSCOPY;  Service: Gastroenterology;;   POLYPECTOMY  06/21/2020   Procedure: POLYPECTOMY;  Surgeon: Kathi Der, MD;  Location: [facility] ENDOSCOPY;  Service: Gastroenterology;;   TRANSTHORACIC ECHOCARDIOGRAM  07/2008  normal LV function, mild MR, mild TR, trace pulm valve regurg    Social History   Tobacco Use  Smoking Status Every Day   Current packs/day: 0.60   Average packs/day: 0.6 packs/day for 45.0 years (27.0 ttl pk-yrs)   Types: Cigarettes  Smokeless Tobacco Never  Tobacco Comments   Smokes 7 packs a week of cigarettes. 05/11/23 [place]    Social History   Substance and Sexual Activity  Alcohol Use Yes   Alcohol/week: 0.0 standard drinks of alcohol   Comment: occ     Allergies  Allergen Reactions   Atorvastatin Other (See Comments)    Muscle cramping   Latex Itching   Propoxyphene Nausea And Vomiting    Darvocet    Current Facility-Administered Medications  Medication Dose Route Frequency Provider Last Rate Last Admin   0.9 %  sodium chloride infusion  250 mL Intravenous PRN Hughie Closs, MD       acetaminophen (TYLENOL) tablet 650 mg  650 mg Oral Q4H PRN Orbie Pyo,  MD       albuterol (PROVENTIL) (2.5 MG/3ML) 0.083% nebulizer solution 2.5 mg  2.5 mg Nebulization Q6H PRN Nolberto Hanlon, MD       carvedilol (COREG) tablet 25 mg  25 mg Oral BID WC Tobb, Kardie, DO   25 mg at 02/09/24 0272   Chlorhexidine Gluconate Cloth 2 % PADS 6 each  6 each Topical Daily Hughie Closs, MD   6 each at 02/09/24 0925   ezetimibe (ZETIA) tablet 10 mg  10 mg Oral Daily [place], Eric J, DO   10 mg at 02/09/24 0925   heparin ADULT infusion 100 units/mL (25000 units/282mL)  950 Units/hr Intravenous Continuous Kendal Hymen, RPH 9.5 mL/hr at 02/09/24 0800 950 Units/hr at 02/09/24 0800   hydrALAZINE (APRESOLINE) injection 10 mg  10 mg Intravenous Q4H PRN Nolberto Hanlon, MD       isosorbide mononitrate (IMDUR) 24 hr tablet 90 mg  90 mg Oral Daily Andrey Farmer, PA-C   90 mg at 02/09/24 5366   labetalol (NORMODYNE) injection 20 mg  20 mg Intravenous Q2H PRN Nolberto Hanlon, MD       losartan (COZAAR) tablet 25 mg  25 mg Oral QHS Laurey Morale, MD   25 mg at 02/08/24 2226   morphine (PF) 2 MG/ML injection 2 mg  2 mg Intravenous Q3H PRN Hughie Closs, MD   2 mg at 02/09/24 4403   nitroGLYCERIN 50 mg in dextrose 5 % 250 mL (0.2 mg/mL) infusion  0-200 mcg/min Intravenous Continuous Andrey Farmer, PA-C 21 mL/hr at 02/09/24 0800 70 mcg/min at 02/09/24 0800   ondansetron (ZOFRAN) injection 4 mg  4 mg Intravenous Q6H PRN Orbie Pyo, MD       pantoprazole (PROTONIX) injection 40 mg  40 mg Intravenous Conchita Paris, MD   40 mg at 02/09/24 0924   polyethylene glycol (MIRALAX / GLYCOLAX) packet 17 g  17 g Oral Daily PRN Nolberto Hanlon, MD       potassium chloride SA (KLOR-CON M) CR tablet 20 mEq  20 mEq Oral BID Orbie Pyo, MD   20 mEq at 02/09/24 0925   ranolazine (RANEXA) 12 hr tablet 500 mg  500 mg Oral BID Nolberto Hanlon, MD   500 mg at 02/09/24 0925   sodium chloride flush (NS) 0.9 % injection 3 mL  3 mL Intravenous Conchita Paris, MD   3 mL at 02/09/24 0925   sodium  chloride  flush (NS) 0.9 % injection 3 mL  3 mL Intravenous Q12H Pahwani, Daleen Bo, MD   3 mL at 02/09/24 8657   umeclidinium-vilanterol (ANORO ELLIPTA) 62.5-25 MCG/ACT 1 puff  1 puff Inhalation Daily Nolberto Hanlon, MD   1 puff at 02/09/24 8469    Medications Prior to Admission  Medication Sig Dispense Refill Last Dose/Taking   albuterol (VENTOLIN HFA) 108 (90 Base) MCG/ACT inhaler Inhale 2 puffs into the lungs every 6 (six) hours as needed for wheezing or shortness of breath. 1 each 6 Taking As Needed   amLODipine (NORVASC) 5 MG tablet TAKE 1 TABLET (5 MG TOTAL) BY MOUTH DAILY. 90 tablet 2 02/05/2024   Bempedoic Acid-Ezetimibe (NEXLIZET) 180-10 MG TABS Take 1 tablet by mouth at bedtime. 90 tablet 3 02/05/2024 Bedtime   Biotin 1 MG CAPS Take 1 mg by mouth daily.    02/05/2024 Bedtime   carvedilol (COREG) 12.5 MG tablet TAKE 1 TABLET (12.5 MG TOTAL) BY MOUTH 2 (TWO) TIMES DAILY WITH A MEAL. KEEP OV. 180 tablet 3 02/05/2024 Bedtime   isosorbide mononitrate (IMDUR) 30 MG 24 hr tablet Take 1 tablet (30 mg total) by mouth daily. 90 tablet 3 02/03/2024   Multiple Vitamin (MULTIVITAMIN WITH MINERALS) TABS Take 1 tablet by mouth daily.   02/05/2024 Bedtime   nitroGLYCERIN (NITROSTAT) 0.4 MG SL tablet Place 1 tablet (0.4 mg total) under the tongue every 5 (five) minutes as needed for chest pain. 25 tablet 3 02/06/2024   omeprazole (PRILOSEC) 20 MG capsule Take 20 mg by mouth daily.   02/05/2024 Bedtime   ranolazine (RANEXA) 500 MG 12 hr tablet Take 1 tablet (500 mg total) by mouth 2 (two) times daily. 60 tablet 0 02/05/2024 Bedtime   umeclidinium-vilanterol (ANORO ELLIPTA) 62.5-25 MCG/ACT AEPB Inhale 1 puff into the lungs daily. 60 each 11 Past Week   valsartan (DIOVAN) 320 MG tablet TAKE 1 TABLET (320 MG TOTAL) BY MOUTH DAILY. KEEP OV. 90 tablet 3 02/05/2024 Bedtime    Family History  Problem Relation Age of Onset   Diabetes Mother    CAD Mother        CABG   Bone cancer Father    Hypertension Father    Heart disease  Brother    Kidney disease Sister    Cancer Sister    Diabetes Sister    Hyperlipidemia Sister    Hypertension Sister      Review of Systems:   ROS   Cardiac Review of Systems: Y or  [    ]= no  Chest Pain [ Y, resolved on NTG   ]  Resting SOB [ N  ] Exertional SOB  [  ]  Orthopnea [  ]   Pedal Edema [   ]    Palpitations [  ] Syncope  [  ]   Presyncope [   ]  General Review of Systems: [Y] = yes [  ]=no Constitional: recent weight change [  ]; anorexia [  ]; fatigue [ N ]; nausea [ N, resolved ]; night sweats [  ]; fever [  ]; or chills [  ]                                                               Dental: Last Dentist  visit:   Eye : blurred vision [  ]; diplopia [   ]; vision changes [  ];  Amaurosis fugax[  ]; Resp: cough [  ];  wheezing[  ];  hemoptysis[  ]; shortness of breath[ N ]; paroxysmal nocturnal dyspnea[  ]; dyspnea on exertion[N  ]; or orthopnea[  ];  GI:  gallstones[  ], vomiting[N  ];  dysphagia[  ]; melena[  ];  hematochezia [  ]; heartburn[  ];   Hx of  Colonoscopy[  ]; GU: kidney stones [  ]; hematuria[  ];   dysuria [  ];  nocturia[  ];  history of     obstruction [  ]; urinary frequency [  ]             Skin: rash, swelling[ N ];, hair loss[  ];  peripheral edema[N  ];  or itching[  ]; Musculosketetal: myalgias[  ];  joint swelling[  ];  joint erythema[  ];  joint pain[  ];  back pain[Y  ];  balloon pump right groin  Heme/Lymph: bruising[  ];  bleeding[  ];  anemia[  ];  Neuro: TIA[ N ];  headaches[  ];  stroke[N  ];  vertigo[  ];  seizures[  ];   paresthesias[  ];  difficulty walking[ N ];  Psych:depression[  ]; anxiety[  ];  Endocrine: diabetes[ N ];  thyroid dysfunction[  ];  Physical Exam: BP 111/87   Pulse 88   Temp 98 F (36.7 C) (Axillary)   Resp 18   Ht 5\' 1"  (1.549 m)   Wt 62.5 kg   SpO2 93%   BMI 26.04 kg/m   General appearance: alert, cooperative, and no distress Head: Normocephalic, without obvious abnormality, atraumatic Neck: no  adenopathy, no carotid bruit, no JVD, supple, symmetrical, trachea midline, and thyroid not enlarged, symmetric, no tenderness/mass/nodules Resp: clear to auscultation bilaterally Cardio: regular rate and rhythm GI: soft, non-tender; bowel sounds normal; no masses,  no organomegaly Extremities: extremities normal, atraumatic, no cyanosis or edema and balloon pump right groin Neurologic: Grossly normal  Diagnostic Studies & Laboratory data:     Recent Radiology Findings:   DG CHEST PORT 1 VIEW Result Date: 02/08/2024 CLINICAL DATA:  Heart failure EXAM: PORTABLE CHEST 1 VIEW COMPARISON:  02/08/2024, 02/06/2024 FINDINGS: No consolidation or effusion. Stable normal cardiomediastinal silhouette. Slightly more cranial position of intra aortic balloon pump marker, now projects over level of left pulmonary artery and is about 2.5 cm caudal to the AP window. Mild vascular congestion without overt edema. No pleural effusion or pneumothorax IMPRESSION: 1. Slightly more cranial position of intra aortic balloon pump marker compared to prior, now projects over level of left pulmonary artery and is about 2.5 cm caudal to the AP window. 2. Mild vascular congestion without overt edema. Electronically Signed   By: Jasmine Pang M.D.   On: 02/08/2024 21:01   DG Chest Port 1 View Result Date: 02/08/2024 CLINICAL DATA:  Aortic balloon pump EXAM: PORTABLE CHEST 1 VIEW COMPARISON:  X-ray 02/06/2024 FINDINGS: No consolidation, pneumothorax or effusion. Normal cardiopericardial silhouette without edema. Chronic lung changes identified. There is a marker seen overlying the mid descending thoracic aorta. With the history this could be the marker for the aortic balloon pump. Overlapping cardiac leads. Critical Value/emergent results were called by telephone at the time of interpretation on 02/08/2024 at 7:39 pm to provider [facility] , who verbally acknowledged these results. IMPRESSION: Hyperinflation with chronic lung  changes.  No pneumothorax or edema. Linear radiopaque density overlying the mid descending thoracic aorta. With the history this could be the location of the air balloon pump. Please correlate clinical findings and assess need for repositioning Electronically Signed   By: Karen Kays M.D.   On: 02/08/2024 19:53   CARDIAC CATHETERIZATION Result Date: 02/08/2024   Prox RCA to Dist RCA lesion is 100% stenosed.   Dist LAD lesion is 99% stenosed.   Prox Cx to Mid Cx lesion is 80% stenosed.   Mid Cx to Dist Cx lesion is 70% stenosed.   Dist Cx lesion is 90% stenosed. 1.  Severe multivessel disease with TIMI II flow in the distal LAD.  Due to ongoing chest pain, an intra-aortic balloon pump was placed and the patient was rendered chest pain-free. 2.  LVEDP of 13 mmHg. Recommendation: The case was discussed with Dr. Shirlee Latch who will assume care.  The patient will be transferred to the 2 heart unit and a cardiothoracic surgical consult will be obtained.   ECHOCARDIOGRAM COMPLETE Result Date: 02/08/2024    ECHOCARDIOGRAM REPORT   Patient Name:   SHAKEIA KRUS Date of Exam: 02/08/2024 Medical Rec #:  [id number]       Height:       61.0 in Accession #:    [id number]      Weight:       137.8 lb Date of Birth:  [birth year]       BSA:          1.612 m Patient Age:    72 years        BP:           149/94 mmHg Patient Gender: F               HR:           85 bpm. Exam Location:  Inpatient Procedure: 2D Echo, Cardiac Doppler and Color Doppler (Both Spectral and Color            Flow Doppler were utilized during procedure). Indications:    Chest Pain R07.9  History:        Patient has no prior history of Echocardiogram examinations.  Sonographer:    Harriette Bouillon RDCS Referring Phys: [id number] Cyndi Bender IMPRESSIONS  1. Left ventricular ejection fraction, by estimation, is 45 to 50%. Left ventricular ejection fraction by 2D MOD biplane is 49.4 %. The left ventricle has mildly decreased function. The left ventricle demonstrates  regional wall motion abnormalities (see  scoring diagram/findings for description). Left ventricular diastolic parameters are consistent with Grade I diastolic dysfunction (impaired relaxation). There is severe hypokinesis of the left ventricular, mid-apical anteroseptal wall, apical segment and anterior wall. The base of the heart is hyperdynamic. Consider LAD territory disease vs. Takatsubo cardiomyopathy.  2. Right ventricular systolic function is normal. The right ventricular size is normal.  3. The mitral valve is grossly normal. Trivial mitral valve regurgitation.  4. The aortic valve is tricuspid. Aortic valve regurgitation is not visualized. No aortic stenosis is present. Comparison(s): No prior Echocardiogram. Conclusion(s)/Recommendation(s): Findings suggest LAD territory ischemia -probably mid-distal disease vs possibly Takatsubo cardiomyopathy. FINDINGS  Left Ventricle: Left ventricular ejection fraction, by estimation, is 45 to 50%. Left ventricular ejection fraction by 2D MOD biplane is 49.4 %. The left ventricle has mildly decreased function. The left ventricle demonstrates regional wall motion abnormalities. Severe hypokinesis of the left ventricular, mid-apical anteroseptal wall, apical segment and anterior wall. Strain imaging was not performed. The left  ventricular internal cavity size was normal in size. There is no left ventricular hypertrophy. Left ventricular diastolic parameters are consistent with Grade I diastolic dysfunction (impaired relaxation). Normal left ventricular filling pressure.  LV Wall Scoring: The mid and distal lateral wall, mid and distal anterior septum, and entire apex are hypokinetic. The base of the heart is hyperdynamic. Consider LAD territory disease vs. Takatsubo cardiomyopathy. Right Ventricle: The right ventricular size is normal. No increase in right ventricular wall thickness. Right ventricular systolic function is normal. Left Atrium: Left atrial size was normal  in size. Right Atrium: Right atrial size was normal in size. Pericardium: There is no evidence of pericardial effusion. Mitral Valve: The mitral valve is grossly normal. Trivial mitral valve regurgitation. Tricuspid Valve: The tricuspid valve is grossly normal. Tricuspid valve regurgitation is trivial. Aortic Valve: The aortic valve is tricuspid. Aortic valve regurgitation is not visualized. No aortic stenosis is present. Pulmonic Valve: The pulmonic valve was not well visualized. Pulmonic valve regurgitation is not visualized. Aorta: The aortic root and ascending aorta are structurally normal, with no evidence of dilitation. Venous: The inferior vena cava was not well visualized. IAS/Shunts: No atrial level shunt detected by color flow Doppler. Additional Comments: 3D imaging was not performed.  LEFT VENTRICLE PLAX 2D                        Biplane EF (MOD) LVIDd:         3.70 cm         LV Biplane EF:   Left LVIDs:         2.50 cm                          ventricular LV PW:         1.00 cm                          ejection LV IVS:        1.00 cm                          fraction by LVOT diam:     1.90 cm                          2D MOD LV SV:         69                               biplane is LV SV Index:   43                               49.4 %. LVOT Area:     2.84 cm                                Diastology                                LV e' medial:    3.81 cm/s LV Volumes (MOD)               LV E/e' medial:  8.8 LV  vol d, MOD    59.4 ml       LV e' lateral:   6.42 cm/s A2C:                           LV E/e' lateral: 5.2 LV vol d, MOD    57.9 ml A4C: LV vol s, MOD    34.4 ml A2C: LV vol s, MOD    29.2 ml A4C: LV SV MOD A2C:   25.0 ml LV SV MOD A4C:   57.9 ml LV SV MOD BP:    30.2 ml RIGHT VENTRICLE RV S prime:     11.00 cm/s TAPSE (M-mode): 1.4 cm LEFT ATRIUM             Index        RIGHT ATRIUM           Index LA diam:        3.10 cm 1.92 cm/m   RA Area:     10.60 cm LA Vol (A2C):   32.6 ml 20.22 ml/m   RA Volume:   21.00 ml  13.03 ml/m LA Vol (A4C):   33.6 ml 20.84 ml/m LA Biplane Vol: 34.0 ml 21.09 ml/m  AORTIC VALVE LVOT Vmax:   121.00 cm/s LVOT Vmean:  72.200 cm/s LVOT VTI:    0.245 m  AORTA Ao Root diam: 2.70 cm Ao Asc diam:  2.90 cm MITRAL VALVE MV Area (PHT): 5.00 cm    SHUNTS MV E velocity: 33.40 cm/s  Systemic VTI:  0.24 m MV A velocity: 68.60 cm/s  Systemic Diam: 1.90 cm MV E/A ratio:  0.49 Zoila Shutter MD Electronically signed by Zoila Shutter MD Signature Date/Time: 02/08/2024/3:44:39 PM    Final    DG ESOPHAGUS W SINGLE CM (SOL OR THIN BA) Result Date: 02/07/2024 CLINICAL DATA:  73 year old female with history of esophageal stricture, possible microperforation. Request for esophagram. EXAM: ESOPHAGUS/BARIUM SWALLOW/TABLET STUDY TECHNIQUE: Single contrast examination was performed using 110 cc of Omnipaque 300. This exam was performed by Buzzy Han, PA-C, and was supervised and interpreted by Dr. Reche Dixon. FLUOROSCOPY: Radiation Exposure Index (as provided by the fluoroscopic device): 21.9 mGy Kerma COMPARISON:  None Available. FINDINGS: Swallowing: Appears normal. Trace penetration seen on initial images. No aspiration seen. Esophagus: Normal appearance. No contrast extravasation noted. No persistent narrowing to suggest stricture. Gastroesophageal reflux: None visualized. Ingested 13mm barium tablet: Passed normally. IMPRESSION: 1. Trace penetration without aspiration. 2.  No esophageal contrast extravasation to suggest perforation. 3.  No esophageal stricture. Procedure performed by Buzzy Han, PA-C. Exam supervised and interpreted by Jeronimo Greaves, M.D. Electronically Signed   By: Jeronimo Greaves M.D.   On: 02/07/2024 16:58     I have independently reviewed the above radiologic studies and discussed with the patient   Recent Lab Findings: Lab Results  Component Value Date   WBC 10.3 02/09/2024   HGB 15.1 (H) 02/09/2024   HCT 46.1 (H) 02/09/2024   PLT 206 02/09/2024   GLUCOSE 116  (H) 02/09/2024   CHOL 181 02/06/2024   TRIG 137 02/06/2024   HDL 50 02/06/2024   LDLDIRECT 82 04/26/2023   LDLCALC 104 (H) 02/06/2024   ALT 13 02/06/2024   AST 19 02/06/2024   NA 137 02/09/2024   K 4.2 02/09/2024   CL 100 02/09/2024   CREATININE 0.96 02/09/2024   BUN 10 02/09/2024   CO2 23 02/09/2024   TSH 0.627 01/31/2020   INR 1.1 02/07/2024  HGBA1C 6.2 (H) 02/07/2024    Assessment / Plan:      Known CAD, previously placed stents in 2014, admitted with chest pain.. cath showing multivessel CAD with recurrent chest pain post procedure requiring balloon pump placement... currently pain free on Heparin and NTG drip HTN HLD Nicotine abuse- quit 3 weeks ago Abdominal pain, distention- chronic..Gastroenterology does not feel further workup was indicated at this time   Patient is pain free on Heparin and NTG drip... Overall she does not wish to have a sternotomy.  She is also unsure of blood products due to previous experience with this.  I explained to patient the process of ensuring products match for her.  I spoke with Dr. Shirlee Latch who stated surgery is her best option.  However, stents are possible but would not be ideal.  Dr. Dorris Fetch to evaluate patient.    I  spent 55 minutes counseling the patient face to face.   Lowella Dandy, PA-C 02/09/2024 10:19 AM  I have seen and examined Mrs. Kathlen Brunswick, I reviewed her cath images.    73 yo with known CAD and multiple CRF including tobacco use, hypertension, hyperlipidemia and a strong family history.  DES x 3 to RCA in 2014.  Presents with unstable CP and also abdominal pain.  W/u revealed severe 3 vessel CAD with EF 40%.  IABP placed for refractory CP.  No Cp since cath.  GI w/u showed some upper abdominal edema but nothing requiring acute intervention.  CABG indicated for survival benefit and relief of symptoms.  I discussed the general nature of the procedure, including the need for general anesthesia, the incisions to be  used, the use of cardiopulmonary bypass, and the use of temporary pacemaker wires and drainage tubes postoperatively with Mrs. Thorstenson and her youngest daughter.  We discussed the expected hospital stay, overall recovery and short and long term outcomes. I informed her of the indications, risks, benefits and alternatives.   She understands the risks include, but are not limited to death, stroke, MI, DVT/PE, bleeding, possible need for transfusion, infections, cardiac arrhythmias, as well as other organ system dysfunction including respiratory, renal, or GI complications.   She understands and accepts the risks and agrees to proceed. She agrees to transfusion if necessary.  For CABG tomorrow AM  Salvatore Decent. Dorris Fetch, MD [facility] [phone]

## 2024-02-09 NOTE — Evaluation (Signed)
Clinical/Bedside Swallow Evaluation Patient Details  Name: Meghan Lynn MRN: [id number] Date of Birth: Apr 30, 1951  Today's Date: 02/09/2024 Time: SLP Start Time (ACUTE ONLY): 4098 SLP Stop Time (ACUTE ONLY): 0911 SLP Time Calculation (min) (ACUTE ONLY): 19 min  Past Medical History:  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.0)    cluster every day   Headache, migraine    daily   Hyperlipidemia    Hypertension    Peptic ulcer disease    Pneumonia    Shortness of breath dyspnea    with exertion   Past Surgical History:  Past Surgical History:  Procedure Laterality Date   BACK SURGERY     BALLOON DILATION N/A 05/30/2013   Procedure: BALLOON DILATION;  Surgeon: Barrie Folk, MD;  Location: [facility] ENDOSCOPY;  Service: Endoscopy;  Laterality: N/A;   BALLOON DILATION N/A 08/02/2013   Procedure: BALLOON DILATION;  Surgeon: Barrie Folk, MD;  Location: [facility] ENDOSCOPY;  Service: Endoscopy;  Laterality: N/A;   BALLOON DILATION N/A 04/29/2016   Procedure: BALLOON DILATION;  Surgeon: Dorena Cookey, MD;  Location: [facility] ENDOSCOPY;  Service: Endoscopy;  Laterality: N/A;   BALLOON DILATION N/A 06/16/2020   Procedure: BALLOON DILATION;  Surgeon: Kerin Salen, MD;  Location: [facility] ENDOSCOPY;  Service: Gastroenterology;  Laterality: N/A;   BIOPSY  06/16/2020   Procedure: BIOPSY;  Surgeon: Kerin Salen, MD;  Location: [facility] ENDOSCOPY;  Service: Gastroenterology;;   CARDIAC CATHETERIZATION  03/07/2002   moderte CAD 80% (mid RCA) - Dr. Chanda Busing   CARDIAC CATHETERIZATION  09/13/2003   Cypher 2.5x73mm and 2.5x18 and Taxus 2.75x37mm to distal, mid, prox RCA (Dr. Laurell Josephs)   CARDIOLITE MYOCARDIAL PERFUSION STUDY  05/2006   positive  bruce protocol, low risk, EF 80%   CARDIOVASCULAR STRESS TEST  2010   COLONOSCOPY     COLONOSCOPY WITH PROPOFOL N/A 06/16/2020   Procedure: COLONOSCOPY WITH PROPOFOL;  Surgeon: Kerin Salen, MD;  Location: [facility] ENDOSCOPY;  Service: Gastroenterology;  Laterality: N/A;   COLONOSCOPY WITH PROPOFOL N/A 06/21/2020   Procedure: COLONOSCOPY WITH PROPOFOL;  Surgeon: Kathi Der, MD;  Location: [facility] ENDOSCOPY;  Service: Gastroenterology;  Laterality: N/A;   CORONARY ANGIOPLASTY  2004   stents x 3   DILATATION & CURETTAGE/HYSTEROSCOPY WITH MYOSURE N/A 08/19/2016   Procedure: DILATATION & CURETTAGE/HYSTEROSCOPY WITH MYOSURE;  Surgeon: Maxie Better, MD;  Location: [facility] ORS;  Service: Gynecology;  Laterality: N/A;   ESOPHAGOGASTRODUODENOSCOPY N/A 05/30/2013   Procedure: ESOPHAGOGASTRODUODENOSCOPY (EGD);  Surgeon: Barrie Folk, MD;  Location: [facility] ENDOSCOPY;  Service: Endoscopy;  Laterality: N/A;   ESOPHAGOGASTRODUODENOSCOPY N/A 08/02/2013   Procedure: ESOPHAGOGASTRODUODENOSCOPY (EGD);  Surgeon: Barrie Folk, MD;  Location: [facility] ENDOSCOPY;  Service: Endoscopy;  Laterality: N/A;  barb /ja   ESOPHAGOGASTRODUODENOSCOPY (EGD) WITH PROPOFOL N/A 04/29/2016   Procedure: ESOPHAGOGASTRODUODENOSCOPY (EGD) WITH PROPOFOL;  Surgeon: Dorena Cookey, MD;  Location: [facility] ENDOSCOPY;  Service: Endoscopy;  Laterality: N/A;   ESOPHAGOGASTRODUODENOSCOPY (EGD) WITH PROPOFOL N/A 06/16/2020   Procedure: ESOPHAGOGASTRODUODENOSCOPY (EGD) WITH PROPOFOL;  Surgeon: Kerin Salen, MD;  Location: [facility] ENDOSCOPY;  Service: Gastroenterology;  Laterality: N/A;   FRACTURE SURGERY  HEMOSTASIS CLIP PLACEMENT  06/21/2020   Procedure: HEMOSTASIS CLIP PLACEMENT;  Surgeon: Kathi Der, MD;  Location: [facility] ENDOSCOPY;  Service: Gastroenterology;;   IABP INSERTION Right 02/08/2024   Procedure: IABP Insertion;  Surgeon: Orbie Pyo, MD;  Location: [facility] INVASIVE CV LAB;  Service: Cardiovascular;  Laterality: Right;   LEFT HEART CATH AND CORONARY ANGIOGRAPHY N/A  02/08/2024   Procedure: LEFT HEART CATH AND CORONARY ANGIOGRAPHY;  Surgeon: Orbie Pyo, MD;  Location: [facility] INVASIVE CV LAB;  Service: Cardiovascular;  Laterality: N/A;   ORIF ELBOW FRACTURE  2006   POLYPECTOMY  06/16/2020   Procedure: POLYPECTOMY;  Surgeon: Kerin Salen, MD;  Location: [facility] ENDOSCOPY;  Service: Gastroenterology;;   POLYPECTOMY  06/21/2020   Procedure: POLYPECTOMY;  Surgeon: Kathi Der, MD;  Location: [facility] ENDOSCOPY;  Service: Gastroenterology;;   TRANSTHORACIC ECHOCARDIOGRAM  07/2008   normal LV function, mild MR, mild TR, trace pulm valve regurg   HPI:  Meghan Lynn is a 73 y.o. female admitted for NSTEMI with 6 months of intermittent solid food dysphagia that occurs while eating and has to drink water to resolve the sensation. Denies any worsening of the dysphagia recently. Denies N/V. History of GERD on PPI. History of pyloric channel stenosis with multiple dilations last on EGD in 2021 (Dr. Marca Ancona) when channel was balloon dilated to 15 mm. No record of esophageal stricture. Intermittent abdominal pain that has worsened since barium swallow with lower abdominal pain now. Barium swallow negative for esophageal stricture. No evidence of esophageal perforation. No aspiration, trace penetration (this SLP reviewed and appears to be flash in nature). CT shows edema in the pancreaticoduodenal groove and along the descending duodenum of unclear source.    Assessment / Plan / Recommendation  Clinical Impression  Patient presents with a suspected primary esophageal dysphagia consistent with GI notes/esophagram results. No overt s/s of an oropharyngeal dysphagia or aspiration noted with patient reporting globus as primary complaint. Reviewed general reflux precautions with patient who verbalized understanding. No SLP f/u indicated at this time. SLP Visit Diagnosis: Dysphagia, unspecified (R13.10)    Aspiration Risk       Diet Recommendation Regular;Thin liquid    Liquid  Administration via: Cup;Straw Medication Administration: Whole meds with liquid Supervision: Patient able to self feed Compensations: Slow rate;Small sips/bites;Follow solids with liquid Postural Changes: Seated upright at 90 degrees;Remain upright for at least 30 minutes after po intake    Other  Recommendations Oral Care Recommendations: Oral care BID    Recommendations for follow up therapy are one component of a multi-disciplinary discharge planning process, led by the attending physician.  Recommendations may be updated based on patient status, additional functional criteria and insurance authorization.  Follow up Recommendations No SLP follow up              Swallow Study   General HPI: Meghan Lynn is a 73 y.o. female admitted for NSTEMI with 6 months of intermittent solid food dysphagia that occurs while eating and has to drink water to resolve the sensation. Denies any worsening of the dysphagia recently. Denies N/V. History of GERD on PPI. History of pyloric channel stenosis with multiple dilations last on EGD in 2021 (Dr. Marca Ancona) when channel was balloon dilated to 15 mm. No record of esophageal stricture. Intermittent abdominal pain that has worsened since barium swallow with lower abdominal pain now. Barium swallow negative for esophageal stricture. No evidence of esophageal perforation. No aspiration, trace penetration (this SLP reviewed and appears to be flash in  nature). CT shows edema in the pancreaticoduodenal groove and along the descending duodenum of unclear source. Type of Study: Bedside Swallow Evaluation Previous Swallow Assessment: see HPI Diet Prior to this Study: Regular;Thin liquids (Level 0) Temperature Spikes Noted: No Respiratory Status: Nasal cannula History of Recent Intubation: No Behavior/Cognition: Alert;Cooperative;Pleasant mood Oral Cavity Assessment: Other (comment) (white lingual coating) Oral Care Completed by SLP: Recent completion by  staff Oral Cavity - Dentition: Adequate natural dentition Vision: Functional for self-feeding Self-Feeding Abilities: Able to feed self Patient Positioning:  (reclined due to heart cath) Baseline Vocal Quality: Normal Volitional Cough: Strong Volitional Swallow: Able to elicit    Oral/Motor/Sensory Function Overall Oral Motor/Sensory Function: Within functional limits   Ice Chips Ice chips: Not tested   Thin Liquid Thin Liquid: Within functional limits Presentation: Straw    Nectar Thick Nectar Thick Liquid: Not tested   Honey Thick Honey Thick Liquid: Not tested   Puree Puree: Within functional limits Presentation: Spoon   Solid     Solid: Within functional limits Presentation: Self Fed     [employer] MA, CCC-SLP  Ajiah Mcglinn Meryl 02/09/2024,9:15 AM

## 2024-02-10 ENCOUNTER — Inpatient Hospital Stay (HOSPITAL_COMMUNITY): Payer: Self-pay | Admitting: Anesthesiology

## 2024-02-10 ENCOUNTER — Other Ambulatory Visit: Payer: Self-pay

## 2024-02-10 ENCOUNTER — Inpatient Hospital Stay (HOSPITAL_COMMUNITY): Payer: Medicare HMO | Admitting: Anesthesiology

## 2024-02-10 ENCOUNTER — Inpatient Hospital Stay (HOSPITAL_COMMUNITY)
Admission: EM | Disposition: A | Discharge status: Home / Self Care | Payer: Self-pay | Source: Home / Self Care | Attending: Thoracic Surgery (Cardiothoracic Vascular Surgery)

## 2024-02-10 ENCOUNTER — Inpatient Hospital Stay (HOSPITAL_COMMUNITY): Payer: Medicare HMO

## 2024-02-10 ENCOUNTER — Encounter (HOSPITAL_COMMUNITY): Payer: Self-pay | Admitting: Internal Medicine

## 2024-02-10 DIAGNOSIS — F17210 Nicotine dependence, cigarettes, uncomplicated: Secondary | ICD-10-CM

## 2024-02-10 DIAGNOSIS — E785 Hyperlipidemia, unspecified: Secondary | ICD-10-CM

## 2024-02-10 DIAGNOSIS — I2510 Atherosclerotic heart disease of native coronary artery without angina pectoris: Secondary | ICD-10-CM

## 2024-02-10 DIAGNOSIS — Z951 Presence of aortocoronary bypass graft: Secondary | ICD-10-CM

## 2024-02-10 DIAGNOSIS — I10 Essential (primary) hypertension: Secondary | ICD-10-CM

## 2024-02-10 HISTORY — PX: TEE WITHOUT CARDIOVERSION: SHX5443

## 2024-02-10 HISTORY — PX: CORONARY ARTERY BYPASS GRAFT: SHX141

## 2024-02-10 LAB — POCT I-STAT, CHEM 8
BUN: 8 mg/dL (ref 8–23)
BUN: 9 mg/dL (ref 8–23)
BUN: 7 mg/dL — ABNORMAL LOW (ref 8–23)
BUN: 9 mg/dL (ref 8–23)
BUN: 9 mg/dL (ref 8–23)
BUN: 8 mg/dL (ref 8–23)
Calcium, Ion: 1.21 mmol/L (ref 1.15–1.40)
Calcium, Ion: 1.18 mmol/L (ref 1.15–1.40)
Calcium, Ion: 0.91 mmol/L — ABNORMAL LOW (ref 1.15–1.40)
Calcium, Ion: 0.98 mmol/L — ABNORMAL LOW (ref 1.15–1.40)
Calcium, Ion: 1.26 mmol/L (ref 1.15–1.40)
Calcium, Ion: 1.21 mmol/L (ref 1.15–1.40)
Chloride: 103 mmol/L (ref 98–111)
Chloride: 103 mmol/L (ref 98–111)
Chloride: 99 mmol/L (ref 98–111)
Chloride: 104 mmol/L (ref 98–111)
Chloride: 105 mmol/L (ref 98–111)
Chloride: 105 mmol/L (ref 98–111)
Creatinine, Ser: 0.9 mg/dL (ref 0.44–1.00)
Creatinine, Ser: 1.1 mg/dL — ABNORMAL HIGH (ref 0.44–1.00)
Creatinine, Ser: 0.7 mg/dL (ref 0.44–1.00)
Creatinine, Ser: 0.9 mg/dL (ref 0.44–1.00)
Creatinine, Ser: 0.9 mg/dL (ref 0.44–1.00)
Creatinine, Ser: 0.9 mg/dL (ref 0.44–1.00)
Glucose, Bld: 125 mg/dL — ABNORMAL HIGH (ref 70–99)
Glucose, Bld: 130 mg/dL — ABNORMAL HIGH (ref 70–99)
Glucose, Bld: 104 mg/dL — ABNORMAL HIGH (ref 70–99)
Glucose, Bld: 130 mg/dL — ABNORMAL HIGH (ref 70–99)
Glucose, Bld: 134 mg/dL — ABNORMAL HIGH (ref 70–99)
Glucose, Bld: 153 mg/dL — ABNORMAL HIGH (ref 70–99)
HCT: 45 % (ref 36.0–46.0)
HCT: 43 % (ref 36.0–46.0)
HCT: 28 % — ABNORMAL LOW (ref 36.0–46.0)
HCT: 30 % — ABNORMAL LOW (ref 36.0–46.0)
HCT: 31 % — ABNORMAL LOW (ref 36.0–46.0)
HCT: 30 % — ABNORMAL LOW (ref 36.0–46.0)
Hemoglobin: 15.3 g/dL — ABNORMAL HIGH (ref 12.0–15.0)
Hemoglobin: 14.6 g/dL (ref 12.0–15.0)
Hemoglobin: 9.5 g/dL — ABNORMAL LOW (ref 12.0–15.0)
Hemoglobin: 10.2 g/dL — ABNORMAL LOW (ref 12.0–15.0)
Hemoglobin: 10.5 g/dL — ABNORMAL LOW (ref 12.0–15.0)
Hemoglobin: 10.2 g/dL — ABNORMAL LOW (ref 12.0–15.0)
Potassium: 4 mmol/L (ref 3.5–5.1)
Potassium: 4 mmol/L (ref 3.5–5.1)
Potassium: 4.4 mmol/L (ref 3.5–5.1)
Potassium: 6 mmol/L — ABNORMAL HIGH (ref 3.5–5.1)
Potassium: 6.8 mmol/L (ref 3.5–5.1)
Potassium: 5.7 mmol/L — ABNORMAL HIGH (ref 3.5–5.1)
Sodium: 139 mmol/L (ref 135–145)
Sodium: 138 mmol/L (ref 135–145)
Sodium: 137 mmol/L (ref 135–145)
Sodium: 136 mmol/L (ref 135–145)
Sodium: 138 mmol/L (ref 135–145)
Sodium: 139 mmol/L (ref 135–145)
TCO2: 27 mmol/L (ref 22–32)
TCO2: 25 mmol/L (ref 22–32)
TCO2: 23 mmol/L (ref 22–32)
TCO2: 26 mmol/L (ref 22–32)
TCO2: 29 mmol/L (ref 22–32)
TCO2: 28 mmol/L (ref 22–32)

## 2024-02-10 LAB — POCT I-STAT 7, (LYTES, BLD GAS, ICA,H+H)
Acid-Base Excess: 0 mmol/L (ref 0.0–2.0)
Acid-Base Excess: 4 mmol/L — ABNORMAL HIGH (ref 0.0–2.0)
Acid-base deficit: 1 mmol/L (ref 0.0–2.0)
Acid-base deficit: 4 mmol/L — ABNORMAL HIGH (ref 0.0–2.0)
Acid-base deficit: 1 mmol/L (ref 0.0–2.0)
Acid-base deficit: 3 mmol/L — ABNORMAL HIGH (ref 0.0–2.0)
Acid-base deficit: 2 mmol/L (ref 0.0–2.0)
Acid-base deficit: 2 mmol/L (ref 0.0–2.0)
Bicarbonate: 25.9 mmol/L (ref 20.0–28.0)
Bicarbonate: 20.3 mmol/L (ref 20.0–28.0)
Bicarbonate: 23.6 mmol/L (ref 20.0–28.0)
Bicarbonate: 28.5 mmol/L — ABNORMAL HIGH (ref 20.0–28.0)
Bicarbonate: 25.8 mmol/L (ref 20.0–28.0)
Bicarbonate: 25.8 mmol/L (ref 20.0–28.0)
Bicarbonate: 24.8 mmol/L (ref 20.0–28.0)
Bicarbonate: 25.2 mmol/L (ref 20.0–28.0)
Calcium, Ion: 1.22 mmol/L (ref 1.15–1.40)
Calcium, Ion: 0.93 mmol/L — ABNORMAL LOW (ref 1.15–1.40)
Calcium, Ion: 0.99 mmol/L — ABNORMAL LOW (ref 1.15–1.40)
Calcium, Ion: 1.26 mmol/L (ref 1.15–1.40)
Calcium, Ion: 1.22 mmol/L (ref 1.15–1.40)
Calcium, Ion: 1.23 mmol/L (ref 1.15–1.40)
Calcium, Ion: 1.18 mmol/L (ref 1.15–1.40)
Calcium, Ion: 1.21 mmol/L (ref 1.15–1.40)
HCT: 44 % (ref 36.0–46.0)
HCT: 30 % — ABNORMAL LOW (ref 36.0–46.0)
HCT: 32 % — ABNORMAL LOW (ref 36.0–46.0)
HCT: 30 % — ABNORMAL LOW (ref 36.0–46.0)
HCT: 32 % — ABNORMAL LOW (ref 36.0–46.0)
HCT: 34 % — ABNORMAL LOW (ref 36.0–46.0)
HCT: 32 % — ABNORMAL LOW (ref 36.0–46.0)
HCT: 33 % — ABNORMAL LOW (ref 36.0–46.0)
Hemoglobin: 15 g/dL (ref 12.0–15.0)
Hemoglobin: 10.2 g/dL — ABNORMAL LOW (ref 12.0–15.0)
Hemoglobin: 10.9 g/dL — ABNORMAL LOW (ref 12.0–15.0)
Hemoglobin: 10.2 g/dL — ABNORMAL LOW (ref 12.0–15.0)
Hemoglobin: 10.9 g/dL — ABNORMAL LOW (ref 12.0–15.0)
Hemoglobin: 11.6 g/dL — ABNORMAL LOW (ref 12.0–15.0)
Hemoglobin: 10.9 g/dL — ABNORMAL LOW (ref 12.0–15.0)
Hemoglobin: 11.2 g/dL — ABNORMAL LOW (ref 12.0–15.0)
O2 Saturation: 100 %
O2 Saturation: 100 %
O2 Saturation: 100 %
O2 Saturation: 100 %
O2 Saturation: 93 %
O2 Saturation: 86 %
O2 Saturation: 96 %
O2 Saturation: 94 %
Patient temperature: 35.3
Patient temperature: 37.1
Patient temperature: 36.9
Patient temperature: 37.1
Potassium: 4 mmol/L (ref 3.5–5.1)
Potassium: 4.4 mmol/L (ref 3.5–5.1)
Potassium: 6 mmol/L — ABNORMAL HIGH (ref 3.5–5.1)
Potassium: 6 mmol/L — ABNORMAL HIGH (ref 3.5–5.1)
Potassium: 4.7 mmol/L (ref 3.5–5.1)
Potassium: 6.1 mmol/L — ABNORMAL HIGH (ref 3.5–5.1)
Potassium: 6 mmol/L — ABNORMAL HIGH (ref 3.5–5.1)
Potassium: 5.6 mmol/L — ABNORMAL HIGH (ref 3.5–5.1)
Sodium: 139 mmol/L (ref 135–145)
Sodium: 136 mmol/L (ref 135–145)
Sodium: 137 mmol/L (ref 135–145)
Sodium: 138 mmol/L (ref 135–145)
Sodium: 141 mmol/L (ref 135–145)
Sodium: 138 mmol/L (ref 135–145)
Sodium: 139 mmol/L (ref 135–145)
Sodium: 139 mmol/L (ref 135–145)
TCO2: 27 mmol/L (ref 22–32)
TCO2: 21 mmol/L — ABNORMAL LOW (ref 22–32)
TCO2: 25 mmol/L (ref 22–32)
TCO2: 30 mmol/L (ref 22–32)
TCO2: 27 mmol/L (ref 22–32)
TCO2: 28 mmol/L (ref 22–32)
TCO2: 26 mmol/L (ref 22–32)
TCO2: 27 mmol/L (ref 22–32)
pCO2 arterial: 46.8 mm[Hg] (ref 32–48)
pCO2 arterial: 32.2 mmHg (ref 32–48)
pCO2 arterial: 37.7 mm[Hg] (ref 32–48)
pCO2 arterial: 43.8 mm[Hg] (ref 32–48)
pCO2 arterial: 47 mm[Hg] (ref 32–48)
pCO2 arterial: 63 mm[Hg] — ABNORMAL HIGH (ref 32–48)
pCO2 arterial: 52.4 mm[Hg] — ABNORMAL HIGH (ref 32–48)
pCO2 arterial: 51.4 mm[Hg] — ABNORMAL HIGH (ref 32–48)
pH, Arterial: 7.351 (ref 7.35–7.45)
pH, Arterial: 7.406 (ref 7.35–7.45)
pH, Arterial: 7.408 (ref 7.35–7.45)
pH, Arterial: 7.422 (ref 7.35–7.45)
pH, Arterial: 7.339 — ABNORMAL LOW (ref 7.35–7.45)
pH, Arterial: 7.221 — ABNORMAL LOW (ref 7.35–7.45)
pH, Arterial: 7.283 — ABNORMAL LOW (ref 7.35–7.45)
pH, Arterial: 7.299 — ABNORMAL LOW (ref 7.35–7.45)
pO2, Arterial: 348 mm[Hg] — ABNORMAL HIGH (ref 83–108)
pO2, Arterial: 373 mm[Hg] — ABNORMAL HIGH (ref 83–108)
pO2, Arterial: 435 mmHg — ABNORMAL HIGH (ref 83–108)
pO2, Arterial: 343 mm[Hg] — ABNORMAL HIGH (ref 83–108)
pO2, Arterial: 66 mm[Hg] — ABNORMAL LOW (ref 83–108)
pO2, Arterial: 64 mm[Hg] — ABNORMAL LOW (ref 83–108)
pO2, Arterial: 92 mm[Hg] (ref 83–108)
pO2, Arterial: 79 mm[Hg] — ABNORMAL LOW (ref 83–108)

## 2024-02-10 LAB — BASIC METABOLIC PANEL
Anion gap: 13 (ref 5–15)
Anion gap: 9 (ref 5–15)
BUN: 8 mg/dL (ref 8–23)
BUN: 7 mg/dL — ABNORMAL LOW (ref 8–23)
CO2: 20 mmol/L — ABNORMAL LOW (ref 22–32)
CO2: 24 mmol/L (ref 22–32)
Calcium: 9.3 mg/dL (ref 8.9–10.3)
Calcium: 8.2 mg/dL — ABNORMAL LOW (ref 8.9–10.3)
Chloride: 104 mmol/L (ref 98–111)
Chloride: 104 mmol/L (ref 98–111)
Creatinine, Ser: 0.99 mg/dL (ref 0.44–1.00)
Creatinine, Ser: 1.05 mg/dL — ABNORMAL HIGH (ref 0.44–1.00)
GFR, Estimated: >60 mL/min (ref >60)
GFR, Estimated: 56 mL/min — ABNORMAL LOW (ref >60)
Glucose, Bld: 121 mg/dL — ABNORMAL HIGH (ref 70–99)
Glucose, Bld: 164 mg/dL — ABNORMAL HIGH (ref 70–99)
Potassium: 4.5 mmol/L (ref 3.5–5.1)
Potassium: 6.1 mmol/L — ABNORMAL HIGH (ref 3.5–5.1)
Sodium: 137 mmol/L (ref 135–145)
Sodium: 137 mmol/L (ref 135–145)

## 2024-02-10 LAB — CBC
HCT: 45.8 % (ref 36.0–46.0)
HCT: 30.1 % — ABNORMAL LOW (ref 36.0–46.0)
HCT: 33.7 % — ABNORMAL LOW (ref 36.0–46.0)
Hemoglobin: 14.8 g/dL (ref 12.0–15.0)
Hemoglobin: 9.9 g/dL — ABNORMAL LOW (ref 12.0–15.0)
Hemoglobin: 10.6 g/dL — ABNORMAL LOW (ref 12.0–15.0)
MCH: 25.2 pg — ABNORMAL LOW (ref 26.0–34.0)
MCH: 26.4 pg (ref 26.0–34.0)
MCH: 26 pg (ref 26.0–34.0)
MCHC: 32.3 g/dL (ref 30.0–36.0)
MCHC: 32.9 g/dL (ref 30.0–36.0)
MCHC: 31.5 g/dL (ref 30.0–36.0)
MCV: 78 fL — ABNORMAL LOW (ref 80.0–100.0)
MCV: 80.3 fL (ref 80.0–100.0)
MCV: 82.8 fL (ref 80.0–100.0)
Platelets: 186 10*3/uL (ref 150–400)
Platelets: 101 10*3/uL — ABNORMAL LOW (ref 150–400)
Platelets: 118 10*3/uL — ABNORMAL LOW (ref 150–400)
RBC: 5.87 MIL/uL — ABNORMAL HIGH (ref 3.87–5.11)
RBC: 3.75 MIL/uL — ABNORMAL LOW (ref 3.87–5.11)
RBC: 4.07 MIL/uL (ref 3.87–5.11)
RDW: 13.7 % (ref 11.5–15.5)
RDW: 13.9 % (ref 11.5–15.5)
RDW: 14.1 % (ref 11.5–15.5)
WBC: 11.9 10*3/uL — ABNORMAL HIGH (ref 4.0–10.5)
WBC: 19 10*3/uL — ABNORMAL HIGH (ref 4.0–10.5)
WBC: 21 10*3/uL — ABNORMAL HIGH (ref 4.0–10.5)
nRBC: 0 % (ref 0.0–0.2)
nRBC: 0 % (ref 0.0–0.2)
nRBC: 0 % (ref 0.0–0.2)

## 2024-02-10 LAB — GLUCOSE, CAPILLARY
Glucose-Capillary: 134 mg/dL — ABNORMAL HIGH (ref 70–99)
Glucose-Capillary: 137 mg/dL — ABNORMAL HIGH (ref 70–99)
Glucose-Capillary: 145 mg/dL — ABNORMAL HIGH (ref 70–99)
Glucose-Capillary: 169 mg/dL — ABNORMAL HIGH (ref 70–99)
Glucose-Capillary: 155 mg/dL — ABNORMAL HIGH (ref 70–99)
Glucose-Capillary: 160 mg/dL — ABNORMAL HIGH (ref 70–99)
Glucose-Capillary: 165 mg/dL — ABNORMAL HIGH (ref 70–99)
Glucose-Capillary: 150 mg/dL — ABNORMAL HIGH (ref 70–99)
Glucose-Capillary: 159 mg/dL — ABNORMAL HIGH (ref 70–99)
Glucose-Capillary: 150 mg/dL — ABNORMAL HIGH (ref 70–99)

## 2024-02-10 LAB — LIPOPROTEIN A (LPA): Lipoprotein (a): 80.7 nmol/L — ABNORMAL HIGH (ref <75.0)

## 2024-02-10 LAB — APTT: aPTT: 42 s — ABNORMAL HIGH (ref 24–36)

## 2024-02-10 LAB — HEMOGLOBIN AND HEMATOCRIT, BLOOD
HCT: 29.6 % — ABNORMAL LOW (ref 36.0–46.0)
Hemoglobin: 9.6 g/dL — ABNORMAL LOW (ref 12.0–15.0)

## 2024-02-10 LAB — HEPARIN LEVEL (UNFRACTIONATED): Heparin Unfractionated: 0.31 [IU]/mL (ref 0.30–0.70)

## 2024-02-10 LAB — MAGNESIUM: Magnesium: 3.5 mg/dL — ABNORMAL HIGH (ref 1.7–2.4)

## 2024-02-10 LAB — PLATELET COUNT: Platelets: 108 10*3/uL — ABNORMAL LOW (ref 150–400)

## 2024-02-10 LAB — PROTIME-INR
INR: 1.6 — ABNORMAL HIGH (ref 0.8–1.2)
Prothrombin Time: 19 s — ABNORMAL HIGH (ref 11.4–15.2)

## 2024-02-10 SURGERY — CORONARY ARTERY BYPASS GRAFTING (CABG)
Anesthesia: General | Site: Chest

## 2024-02-10 MED ORDER — SODIUM CHLORIDE 0.45 % IV SOLN: INTRAVENOUS | Status: AC | PRN

## 2024-02-10 MED ORDER — METOCLOPRAMIDE HCL 5 MG/ML IJ SOLN
10.0000 mg | Freq: Four times a day (QID) | INTRAMUSCULAR | Status: AC
  Administered 2024-02-10 – 2024-02-11 (×6): 10 mg via INTRAVENOUS
  Filled 2024-02-10 (×6): qty 2

## 2024-02-10 MED ORDER — SODIUM CHLORIDE 0.9% FLUSH
3.0000 mL | Freq: Two times a day (BID) | INTRAVENOUS | Status: DC
  Administered 2024-02-11 – 2024-02-12 (×3): 3 mL via INTRAVENOUS

## 2024-02-10 MED ORDER — LACTATED RINGERS IV SOLN: INTRAVENOUS | Status: AC

## 2024-02-10 MED ORDER — HEMOSTATIC AGENTS (NO CHARGE) OPTIME
TOPICAL | Status: DC | PRN
  Administered 2024-02-10: 1 via TOPICAL

## 2024-02-10 MED ORDER — DEXTROSE 50 % IV SOLN
INTRAVENOUS | Status: AC
Start: 2024-02-10 — End: ?
  Filled 2024-02-10: qty 50

## 2024-02-10 MED ORDER — FENTANYL CITRATE (PF) 250 MCG/5ML IJ SOLN
INTRAMUSCULAR | Status: DC | PRN
  Administered 2024-02-10: 150 ug via INTRAVENOUS
  Administered 2024-02-10 (×2): 100 ug via INTRAVENOUS
  Administered 2024-02-10: 150 ug via INTRAVENOUS
  Administered 2024-02-10: 50 ug via INTRAVENOUS
  Administered 2024-02-10 (×2): 100 ug via INTRAVENOUS
  Administered 2024-02-10: 150 ug via INTRAVENOUS
  Administered 2024-02-10: 300 ug via INTRAVENOUS
  Administered 2024-02-10: 50 ug via INTRAVENOUS

## 2024-02-10 MED ORDER — PROPOFOL 10 MG/ML IV BOLUS
INTRAVENOUS | Status: AC
  Filled 2024-02-10: qty 20

## 2024-02-10 MED ORDER — METOPROLOL TARTRATE 25 MG/10 ML ORAL SUSPENSION: 12.5000 mg | Freq: Two times a day (BID) | ORAL | Status: DC

## 2024-02-10 MED ORDER — ACETAMINOPHEN 160 MG/5ML PO SOLN
1000.0000 mg | Freq: Four times a day (QID) | ORAL | Status: DC
  Administered 2024-02-11: 1000 mg
  Filled 2024-02-10: qty 40.6

## 2024-02-10 MED ORDER — MORPHINE SULFATE (PF) 2 MG/ML IV SOLN
1.0000 mg | INTRAVENOUS | Status: DC | PRN
  Administered 2024-02-10: 4 mg via INTRAVENOUS
  Administered 2024-02-10 – 2024-02-12 (×6): 2 mg via INTRAVENOUS
  Filled 2024-02-10 (×8): qty 1

## 2024-02-10 MED ORDER — HEPARIN SODIUM (PORCINE) 1000 UNIT/ML IJ SOLN
INTRAMUSCULAR | Status: DC | PRN
Start: 2024-02-10 — End: 2024-02-10
  Administered 2024-02-10: 20000 [IU] via INTRAVENOUS
  Administered 2024-02-10: 2000 [IU] via INTRAVENOUS

## 2024-02-10 MED ORDER — PROTAMINE SULFATE 10 MG/ML IV SOLN
INTRAVENOUS | Status: DC | PRN
  Administered 2024-02-10: 220 mg via INTRAVENOUS

## 2024-02-10 MED ORDER — FENTANYL CITRATE (PF) 250 MCG/5ML IJ SOLN
INTRAMUSCULAR | Status: AC
  Filled 2024-02-10: qty 5

## 2024-02-10 MED ORDER — POTASSIUM CHLORIDE 10 MEQ/50ML IV SOLN
10.0000 meq | INTRAVENOUS | Status: AC
  Filled 2024-02-10: qty 50

## 2024-02-10 MED ORDER — PANTOPRAZOLE SODIUM 40 MG IV SOLR
40.0000 mg | Freq: Every day | INTRAVENOUS | Status: AC
  Administered 2024-02-10 – 2024-02-11 (×2): 40 mg via INTRAVENOUS
  Filled 2024-02-10 (×2): qty 10

## 2024-02-10 MED ORDER — ROCURONIUM BROMIDE 10 MG/ML (PF) SYRINGE
PREFILLED_SYRINGE | INTRAVENOUS | Status: DC | PRN
  Administered 2024-02-10: 50 mg via INTRAVENOUS
  Administered 2024-02-10: 40 mg via INTRAVENOUS
  Administered 2024-02-10: 50 mg via INTRAVENOUS
  Administered 2024-02-10: 30 mg via INTRAVENOUS
  Administered 2024-02-10: 60 mg via INTRAVENOUS

## 2024-02-10 MED ORDER — ACETAMINOPHEN 160 MG/5ML PO SOLN
650.0000 mg | Freq: Once | ORAL | Status: AC
  Administered 2024-02-10: 650 mg
  Filled 2024-02-10: qty 20.3

## 2024-02-10 MED ORDER — PHENYLEPHRINE 80 MCG/ML (10ML) SYRINGE FOR IV PUSH (FOR BLOOD PRESSURE SUPPORT)
PREFILLED_SYRINGE | INTRAVENOUS | Status: AC
  Filled 2024-02-10: qty 10

## 2024-02-10 MED ORDER — ROCURONIUM BROMIDE 10 MG/ML (PF) SYRINGE
PREFILLED_SYRINGE | INTRAVENOUS | Status: AC
  Filled 2024-02-10: qty 10

## 2024-02-10 MED ORDER — MIDAZOLAM HCL (PF) 10 MG/2ML IJ SOLN
INTRAMUSCULAR | Status: AC
  Filled 2024-02-10: qty 2

## 2024-02-10 MED ORDER — METOPROLOL TARTRATE 12.5 MG HALF TABLET: 12.5000 mg | ORAL_TABLET | Freq: Two times a day (BID) | ORAL | Status: DC

## 2024-02-10 MED ORDER — PHENYLEPHRINE HCL-NACL 20-0.9 MG/250ML-% IV SOLN: 0.0000 ug/min | INTRAVENOUS | Status: DC

## 2024-02-10 MED ORDER — 0.9 % SODIUM CHLORIDE (POUR BTL) OPTIME
TOPICAL | Status: DC | PRN
  Administered 2024-02-10: 6000 mL

## 2024-02-10 MED ORDER — SODIUM CHLORIDE 0.9 % IV SOLN
250.0000 mL | INTRAVENOUS | Status: AC
  Administered 2024-02-11: 250 mL via INTRAVENOUS

## 2024-02-10 MED ORDER — ACETAMINOPHEN 500 MG PO TABS
1000.0000 mg | ORAL_TABLET | Freq: Four times a day (QID) | ORAL | Status: AC
  Administered 2024-02-11 – 2024-02-15 (×19): 1000 mg via ORAL
  Filled 2024-02-10 (×20): qty 2

## 2024-02-10 MED ORDER — MAGNESIUM SULFATE 4 GM/100ML IV SOLN
4.0000 g | Freq: Once | INTRAVENOUS | Status: AC
  Administered 2024-02-10: 4 g via INTRAVENOUS
  Filled 2024-02-10: qty 100

## 2024-02-10 MED ORDER — ALBUMIN HUMAN 5 % IV SOLN: INTRAVENOUS | Status: DC | PRN

## 2024-02-10 MED ORDER — MIDAZOLAM HCL 2 MG/2ML IJ SOLN
2.0000 mg | INTRAMUSCULAR | Status: DC | PRN
  Administered 2024-02-10: 2 mg via INTRAVENOUS
  Filled 2024-02-10: qty 2

## 2024-02-10 MED ORDER — SODIUM CHLORIDE 0.9% FLUSH: 3.0000 mL | INTRAVENOUS | Status: DC | PRN

## 2024-02-10 MED ORDER — NITROGLYCERIN 0.2 MG/ML ON CALL CATH LAB
INTRAVENOUS | Status: DC | PRN
Start: 2024-02-10 — End: 2024-02-10
  Administered 2024-02-10 (×3): 20 ug via INTRAVENOUS

## 2024-02-10 MED ORDER — LACTATED RINGERS IV SOLN: INTRAVENOUS | Status: DC | PRN

## 2024-02-10 MED ORDER — ASPIRIN 325 MG PO TBEC
325.0000 mg | DELAYED_RELEASE_TABLET | Freq: Every day | ORAL | Status: DC
  Administered 2024-02-11 – 2024-02-13 (×3): 325 mg via ORAL
  Filled 2024-02-10 (×3): qty 1

## 2024-02-10 MED ORDER — DEXTROSE 50 % IV SOLN: 0.0000 mL | INTRAVENOUS | Status: DC | PRN

## 2024-02-10 MED ORDER — PHENYLEPHRINE 80 MCG/ML (10ML) SYRINGE FOR IV PUSH (FOR BLOOD PRESSURE SUPPORT)
PREFILLED_SYRINGE | INTRAVENOUS | Status: DC | PRN
  Administered 2024-02-10 (×2): 160 ug via INTRAVENOUS
  Administered 2024-02-10: 80 ug via INTRAVENOUS
  Administered 2024-02-10: 160 ug via INTRAVENOUS
  Administered 2024-02-10 (×7): 80 ug via INTRAVENOUS

## 2024-02-10 MED ORDER — TRAMADOL HCL 50 MG PO TABS
50.0000 mg | ORAL_TABLET | ORAL | Status: DC | PRN
  Administered 2024-02-11 – 2024-02-16 (×9): 100 mg via ORAL
  Filled 2024-02-10 (×10): qty 2

## 2024-02-10 MED ORDER — CEFAZOLIN SODIUM-DEXTROSE 2-4 GM/100ML-% IV SOLN
2.0000 g | Freq: Three times a day (TID) | INTRAVENOUS | Status: AC
Start: 2024-02-10 — End: 2024-02-12
  Administered 2024-02-10 – 2024-02-12 (×6): 2 g via INTRAVENOUS
  Filled 2024-02-10 (×6): qty 100

## 2024-02-10 MED ORDER — CHLORHEXIDINE GLUCONATE 0.12 % MT SOLN
15.0000 mL | OROMUCOSAL | Status: AC
  Administered 2024-02-10: 15 mL via OROMUCOSAL
  Filled 2024-02-10: qty 15

## 2024-02-10 MED ORDER — PROPOFOL 10 MG/ML IV BOLUS
INTRAVENOUS | Status: DC | PRN
  Administered 2024-02-10: 30 mg via INTRAVENOUS
  Administered 2024-02-10: 60 mg via INTRAVENOUS
  Administered 2024-02-10: 50 mg via INTRAVENOUS

## 2024-02-10 MED ORDER — SODIUM CHLORIDE 0.9% FLUSH
3.0000 mL | Freq: Two times a day (BID) | INTRAVENOUS | Status: DC
Start: 2024-02-10 — End: 2024-02-13
  Administered 2024-02-10 – 2024-02-12 (×5): 10 mL via INTRAVENOUS

## 2024-02-10 MED ORDER — BISACODYL 10 MG RE SUPP
10.0000 mg | Freq: Every day | RECTAL | Status: DC
  Administered 2024-02-17: 10 mg via RECTAL
  Filled 2024-02-10: qty 1

## 2024-02-10 MED ORDER — PANTOPRAZOLE SODIUM 40 MG PO TBEC
40.0000 mg | DELAYED_RELEASE_TABLET | Freq: Every day | ORAL | Status: DC
  Administered 2024-02-12 – 2024-02-21 (×10): 40 mg via ORAL
  Filled 2024-02-10 (×11): qty 1

## 2024-02-10 MED ORDER — DEXMEDETOMIDINE HCL IN NACL 400 MCG/100ML IV SOLN
0.0000 ug/kg/h | INTRAVENOUS | Status: DC
  Administered 2024-02-10: 0.7 ug/kg/h via INTRAVENOUS

## 2024-02-10 MED ORDER — METOPROLOL TARTRATE 5 MG/5ML IV SOLN: 2.5000 mg | INTRAVENOUS | Status: DC | PRN

## 2024-02-10 MED ORDER — NITROGLYCERIN IN D5W 200-5 MCG/ML-% IV SOLN: 0.0000 ug/min | INTRAVENOUS | Status: DC

## 2024-02-10 MED ORDER — SODIUM CHLORIDE (PF) 0.9 % IJ SOLN
OROMUCOSAL | Status: DC | PRN
  Administered 2024-02-10 (×4): 4 mL via TOPICAL

## 2024-02-10 MED ORDER — PLASMA-LYTE A IV SOLN
INTRAVENOUS | Status: DC | PRN
  Administered 2024-02-10: 500 mL via INTRAVASCULAR

## 2024-02-10 MED ORDER — HEPARIN SODIUM (PORCINE) 1000 UNIT/ML IJ SOLN
INTRAMUSCULAR | Status: AC
  Filled 2024-02-10: qty 1

## 2024-02-10 MED ORDER — INSULIN REGULAR(HUMAN) IN NACL 100-0.9 UT/100ML-% IV SOLN: INTRAVENOUS | Status: DC

## 2024-02-10 MED ORDER — ASPIRIN 81 MG PO CHEW: 324.0000 mg | CHEWABLE_TABLET | Freq: Every day | ORAL | Status: DC

## 2024-02-10 MED ORDER — MIDAZOLAM HCL (PF) 5 MG/ML IJ SOLN
INTRAMUSCULAR | Status: DC | PRN
  Administered 2024-02-10: 3 mg via INTRAVENOUS
  Administered 2024-02-10 (×2): 1 mg via INTRAVENOUS

## 2024-02-10 MED ORDER — ASPIRIN 81 MG PO CHEW
324.0000 mg | CHEWABLE_TABLET | Freq: Once | ORAL | Status: AC
  Administered 2024-02-10: 324 mg via ORAL
  Filled 2024-02-10: qty 4

## 2024-02-10 MED ORDER — PROTAMINE SULFATE 10 MG/ML IV SOLN
INTRAVENOUS | Status: AC
  Filled 2024-02-10: qty 25

## 2024-02-10 MED ORDER — ROCURONIUM BROMIDE 10 MG/ML (PF) SYRINGE
PREFILLED_SYRINGE | INTRAVENOUS | Status: AC
  Filled 2024-02-10: qty 20

## 2024-02-10 MED ORDER — OXYCODONE HCL 5 MG PO TABS
5.0000 mg | ORAL_TABLET | ORAL | Status: DC | PRN
  Administered 2024-02-11: 10 mg via ORAL
  Administered 2024-02-11: 5 mg via ORAL
  Administered 2024-02-11 – 2024-02-12 (×4): 10 mg via ORAL
  Administered 2024-02-12: 5 mg via ORAL
  Administered 2024-02-12 – 2024-02-21 (×18): 10 mg via ORAL
  Filled 2024-02-10: qty 2
  Filled 2024-02-10: qty 1
  Filled 2024-02-10 (×13): qty 2
  Filled 2024-02-10: qty 1
  Filled 2024-02-10 (×9): qty 2

## 2024-02-10 MED ORDER — VANCOMYCIN HCL IN DEXTROSE 1-5 GM/200ML-% IV SOLN
1000.0000 mg | Freq: Once | INTRAVENOUS | Status: AC
  Administered 2024-02-10: 1000 mg via INTRAVENOUS
  Filled 2024-02-10: qty 200

## 2024-02-10 MED ORDER — BISACODYL 5 MG PO TBEC
10.0000 mg | DELAYED_RELEASE_TABLET | Freq: Every day | ORAL | Status: DC
  Administered 2024-02-12 – 2024-02-21 (×6): 10 mg via ORAL
  Filled 2024-02-10 (×8): qty 2

## 2024-02-10 MED ORDER — ALBUMIN HUMAN 5 % IV SOLN
250.0000 mL | INTRAVENOUS | Status: DC | PRN
  Administered 2024-02-10 – 2024-02-11 (×4): 12.5 g via INTRAVENOUS
  Filled 2024-02-10 (×3): qty 250

## 2024-02-10 MED ORDER — NOREPINEPHRINE 4 MG/250ML-% IV SOLN
0.0000 ug/min | INTRAVENOUS | Status: DC
  Administered 2024-02-10: 2 ug/min via INTRAVENOUS
  Filled 2024-02-10: qty 250

## 2024-02-10 MED ORDER — DOCUSATE SODIUM 100 MG PO CAPS
200.0000 mg | ORAL_CAPSULE | Freq: Every day | ORAL | Status: DC
  Administered 2024-02-12 – 2024-02-20 (×7): 200 mg via ORAL
  Filled 2024-02-10 (×9): qty 2

## 2024-02-10 MED ORDER — ONDANSETRON HCL 4 MG/2ML IJ SOLN: 4.0000 mg | Freq: Four times a day (QID) | INTRAMUSCULAR | Status: DC | PRN

## 2024-02-10 MED ORDER — SODIUM CHLORIDE 0.9 % IV SOLN: INTRAVENOUS | Status: DC | PRN

## 2024-02-10 SURGICAL SUPPLY — 78 items
ADAPTER CARDIO PERF ANTE/RETRO (ADAPTER) IMPLANT
BAG DECANTER FOR FLEXI CONT (MISCELLANEOUS) ×3 IMPLANT
BLADE CLIPPER SURG (BLADE) ×3 IMPLANT
BLADE STERNUM SYSTEM 6 (BLADE) ×3 IMPLANT
BNDG ELASTIC 4INX 5YD STR LF (GAUZE/BANDAGES/DRESSINGS) IMPLANT
BNDG ELASTIC 6INX 5YD STR LF (GAUZE/BANDAGES/DRESSINGS) IMPLANT
BNDG GAUZE DERMACEA FLUFF 4 (GAUZE/BANDAGES/DRESSINGS) ×3 IMPLANT
CANISTER SUCT 3000ML PPV (MISCELLANEOUS) ×3 IMPLANT
CANNULA EZ GLIDE AORTIC 21FR (CANNULA) ×3 IMPLANT
CANNULA GUNDRY RCSP 15FR (MISCELLANEOUS) IMPLANT
CANNULA MC2 2 STG 36/46 CONN (CANNULA) IMPLANT
CATH CPB KIT HENDRICKSON (MISCELLANEOUS) ×3 IMPLANT
CATH THORACIC 36FR (CATHETERS) ×2 IMPLANT
CATH THORACIC 36FR RT ANG (CATHETERS) ×3 IMPLANT
CLIP TI MEDIUM 24 (CLIP) IMPLANT
CLIP TI WIDE RED SMALL 24 (CLIP) IMPLANT
CONTAINER PROTECT SURGISLUSH (MISCELLANEOUS) ×6 IMPLANT
DERMABOND ADVANCED .7 DNX12 (GAUZE/BANDAGES/DRESSINGS) IMPLANT
DRAPE SRG 135X102X78XABS (DRAPES) ×3 IMPLANT
DRAPE WARM FLUID 44X44 (DRAPES) ×2 IMPLANT
DRSG COVADERM 4X10 (GAUZE/BANDAGES/DRESSINGS) IMPLANT
DRSG COVADERM 4X14 (GAUZE/BANDAGES/DRESSINGS) ×3 IMPLANT
ELECT REM PT RETURN 9FT ADLT (ELECTROSURGICAL) ×4 IMPLANT
ELECTRODE REM PT RTRN 9FT ADLT (ELECTROSURGICAL) ×6 IMPLANT
FELT TEFLON 1X6 (MISCELLANEOUS) ×6 IMPLANT
GAUZE 4X4 16PLY ~~LOC~~+RFID DBL (SPONGE) ×3 IMPLANT
GAUZE SPONGE 4X4 12PLY STRL (GAUZE/BANDAGES/DRESSINGS) ×6 IMPLANT
GLOVE SS BIOGEL STRL SZ 7.5 (GLOVE) ×2 IMPLANT
GLOVE SURG SIGNA 7.5 PF LTX (GLOVE) ×6 IMPLANT
GOWN STRL REUS W/ TWL LRG LVL3 (GOWN DISPOSABLE) ×8 IMPLANT
GOWN STRL REUS W/ TWL XL LVL3 (GOWN DISPOSABLE) ×6 IMPLANT
HEMOSTAT POWDER SURGIFOAM 1G (HEMOSTASIS) ×9 IMPLANT
HEMOSTAT SURGICEL 2X14 (HEMOSTASIS) ×3 IMPLANT
KIT BASIN OR (CUSTOM PROCEDURE TRAY) ×3 IMPLANT
KIT SUCTION CATH 14FR (SUCTIONS) ×6 IMPLANT
KIT TURNOVER KIT B (KITS) ×3 IMPLANT
KIT VASOVIEW HEMOPRO 2 VH 4000 (KITS) ×3 IMPLANT
LEAD PACING MYOCARDI (MISCELLANEOUS) IMPLANT
MARKER GRAFT CORONARY BYPASS (MISCELLANEOUS) ×9 IMPLANT
NS IRRIG 1000ML POUR BTL (IV SOLUTION) ×15 IMPLANT
PACK E OPEN HEART (SUTURE) ×3 IMPLANT
PACK OPEN HEART (CUSTOM PROCEDURE TRAY) ×3 IMPLANT
PAD ARMBOARD 7.5X6 YLW CONV (MISCELLANEOUS) ×6 IMPLANT
PAD ELECT DEFIB RADIOL ZOLL (MISCELLANEOUS) ×3 IMPLANT
PENCIL BUTTON HOLSTER BLD 10FT (ELECTRODE) ×3 IMPLANT
POSITIONER HEAD DONUT 9IN (MISCELLANEOUS) ×3 IMPLANT
PUNCH AORTIC ROTATE 4.0MM (MISCELLANEOUS) IMPLANT
SEALANT PROGEL (MISCELLANEOUS) IMPLANT
SET MPS 3-ND DEL (MISCELLANEOUS) IMPLANT
SPONGE T-LAP 18X18 ~~LOC~~+RFID (SPONGE) ×12 IMPLANT
SPONGE T-LAP 4X18 ~~LOC~~+RFID (SPONGE) ×3 IMPLANT
SUPPORT HEART JANKE-BARRON (MISCELLANEOUS) ×3 IMPLANT
SUT BONE WAX W31G (SUTURE) ×3 IMPLANT
SUT PROLENE 4 0 SH DA (SUTURE) IMPLANT
SUT PROLENE 4-0 RB1 .5 CRCL 36 (SUTURE) IMPLANT
SUT PROLENE 6 0 C 1 30 (SUTURE) ×4 IMPLANT
SUT PROLENE 7 0 BV1 MDA (SUTURE) ×3 IMPLANT
SUT PROLENE 8 0 BV175 6 (SUTURE) IMPLANT
SUT STEEL 6MS V (SUTURE) ×3 IMPLANT
SUT STEEL STERNAL CCS#1 18IN (SUTURE) IMPLANT
SUT STEEL SZ 6 DBL 3X14 BALL (SUTURE) ×3 IMPLANT
SUT VIC AB 1 CTX36XBRD ANBCTR (SUTURE) ×6 IMPLANT
SUT VIC AB 2-0 CT1 TAPERPNT 27 (SUTURE) IMPLANT
SUT VIC AB 2-0 CTX 27 (SUTURE) IMPLANT
SUT VIC AB 2-0 CTX 36 (SUTURE) IMPLANT
SUT VIC AB 3-0 SH 27X BRD (SUTURE) IMPLANT
SUT VIC AB 3-0 X1 27 (SUTURE) IMPLANT
SYR 20ML LL LF (SYRINGE) IMPLANT
SYSTEM SAHARA CHEST DRAIN ATS (WOUND CARE) ×3 IMPLANT
TAPE CLOTH SURG 4X10 WHT LF (GAUZE/BANDAGES/DRESSINGS) IMPLANT
TAPE PAPER 2X10 WHT MICROPORE (GAUZE/BANDAGES/DRESSINGS) IMPLANT
TOWEL GREEN STERILE (TOWEL DISPOSABLE) ×3 IMPLANT
TOWEL GREEN STERILE FF (TOWEL DISPOSABLE) ×3 IMPLANT
TRAY FOL W/BAG SLVR 16FR STRL (SET/KITS/TRAYS/PACK) IMPLANT
TUBING LAP HI FLOW INSUFFLATIO (TUBING) ×3 IMPLANT
UNDERPAD 30X36 HEAVY ABSORB (UNDERPADS AND DIAPERS) ×3 IMPLANT
WATER STERILE IRR 1000ML POUR (IV SOLUTION) ×6 IMPLANT
YANKAUER SUCT BULB TIP NO VENT (SUCTIONS) IMPLANT

## 2024-02-10 NOTE — Progress Notes (Signed)
  S/P CABG. Intubated. IABP 1:1. On Neo 40 mcg. Maps 60-70s  PAP: (20-35)/(11-25) 31/17 CVP:  [13 mmHg-19 mmHg] 15 mmHg CO:  [1.8 L/min-2.6 L/min] 2.5 L/min CI:  [1.13 L/min/m2-1.61 L/min/m2] 1.57 L/min/m2   Discussed with Dr Shirlee Latch. Come off Neo and can use Norepi if needed.    Genia Perin NP-C  3:09 PM

## 2024-02-10 NOTE — Brief Op Note (Addendum)
02/06/2024 - 02/10/2024  12:04 PM  PATIENT:  Meghan Lynn  73 y.o. female  PRE-OPERATIVE DIAGNOSIS:  CORONARY ARTERY DISEASE  POST-OPERATIVE DIAGNOSIS:  CORONARY ARTERY DISEASE  PROCEDURE:  Procedure(s): CORONARY ARTERY BYPASS GRAFTING (CABG) TIMES THREE USING LEFT INTERNAL MAMMARY ARTERY AND ENDOSCOPICALLY HARVESTED RIGHT GREATER SAPHENOUS VEIN. (N/A) TRANSESOPHAGEAL ECHOCARDIOGRAM (TEE) (N/A) Vein harvest time: Vein prep time: LIMA-LAD, SVG-OM, SVG-PD  SURGEON:  Surgeons and Role:    * [name], MD - Primary  PHYSICIAN ASSISTANT: Rayhan Groleau PA-C  ASSISTANTS: Dorathy Daft HAYES RNFA   ANESTHESIA:   general  EBL:  450 mL   BLOOD ADMINISTERED:none  DRAINS:  LEFT PLEURAL AND MEDIASTINAL CHEST TUBES    LOCAL MEDICATIONS USED:  NONE  SPECIMEN:  No Specimen  DISPOSITION OF SPECIMEN:  N/A  COUNTS:  YES  TOURNIQUET:  * No tourniquets in log *  DICTATION: .Other Dictation: Dictation Number PENDING  PLAN OF CARE: Admit to inpatient   PATIENT DISPOSITION:  ICU - intubated and hemodynamically stable.   Delay start of Pharmacological VTE agent (>24hrs) due to surgical blood loss or risk of bleeding: yes  COMPLICATIONS: NO KNOWN

## 2024-02-10 NOTE — Anesthesia Procedure Notes (Signed)
Central Venous Catheter Insertion Performed by: Achille Rich, MD, anesthesiologist Start/End2/14/2025 7:04 AM, 02/10/2024 7:14 AM Patient location: Pre-op. Preanesthetic checklist: patient identified, IV checked, site marked, risks and benefits discussed, surgical consent, monitors and equipment checked, pre-op evaluation, timeout performed and anesthesia consent Lidocaine 1% used for infiltration and patient sedated Hand hygiene performed  and maximum sterile barriers used  Catheter size: 8.5 Fr Sheath introducer Procedure performed using ultrasound guided technique. Ultrasound Notes:anatomy identified, needle tip was noted to be adjacent to the nerve/plexus identified, no ultrasound evidence of intravascular and/or intraneural injection and image(s) printed for medical record Attempts: 1 Following insertion, line sutured and dressing applied. Post procedure assessment: blood return through all ports, free fluid flow and no air  Patient tolerated the procedure well with no immediate complications.

## 2024-02-10 NOTE — Discharge Summary (Signed)
 [address]       [place] [address]             [phone]    Physician Discharge Summary  Patient ID: Meghan Lynn MRN: [id number] DOB/AGE: 07/19/1951 73 y.o.  Admit date: 02/06/2024 Discharge date: 02/21/2024  Admission Diagnoses:  Patient Active Problem List   Diagnosis Date Noted   NSTEMI (non-ST elevated myocardial infarction) (HCC) 02/06/2024   Erythrocytosis 02/06/2024   Abdominal pain 02/06/2024   Symptomatic anemia 06/21/2020   Lower GI bleed 06/20/2020   Dysuria 03/13/2020   H/O pyloric stenosis 01/31/2020   Acute pain of right wrist 01/21/2020   Weight gain 07/18/2018   Hair loss 07/18/2018   Screening for thyroid disorder 07/18/2018   Pneumonia    Peptic ulcer disease    Hypertension    Hyperlipidemia    Headache, migraine    Gastritis    Dilatation of esophagus    Coronary artery disease    Burning sensation of feet    Bronchitis due to tobacco use    Asthma    Arthritis    Anginal pain (HCC)    Weakness of both lower extremities 06/22/2017   Screening for abdominal aortic aneurysm 06/22/2017   Shortness of breath 09/29/2016   GERD (gastroesophageal reflux disease) 09/30/2015   Coronary artery disease due to lipid rich plaque 08/29/2014   HTN (hypertension) 08/29/2014   Dyslipidemia 08/29/2014   Smoker 08/29/2014   Spondylolisthesis of l3-4 01/27/2013   Discharge Diagnoses:  Patient Active Problem List   Diagnosis Date Noted   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   NSTEMI (non-ST elevated myocardial infarction) (HCC) 02/06/2024   Erythrocytosis 02/06/2024   Abdominal pain 02/06/2024   Symptomatic anemia 06/21/2020   Lower GI bleed 06/20/2020   Dysuria 03/13/2020   H/O pyloric stenosis 01/31/2020   Acute pain of right wrist 01/21/2020   Weight gain 07/18/2018   Hair loss 07/18/2018   Screening for thyroid disorder 07/18/2018   Pneumonia    Peptic ulcer disease    Hypertension    Hyperlipidemia    Headache, migraine     Gastritis    Dilatation of esophagus    Coronary artery disease    Burning sensation of feet    Bronchitis due to tobacco use    Asthma    Arthritis    Anginal pain (HCC)    Weakness of both lower extremities 06/22/2017   Screening for abdominal aortic aneurysm 06/22/2017   Shortness of breath 09/29/2016   GERD (gastroesophageal reflux disease) 09/30/2015   Coronary artery disease due to lipid rich plaque 08/29/2014   HTN (hypertension) 08/29/2014   Dyslipidemia 08/29/2014   Smoker 08/29/2014   Spondylolisthesis of l3-4 01/27/2013   Discharged Condition: good  History of Present Illness:  Meghan Lynn is a 74 yo AA female with history of GERD, Esophageal Stricture with recent failed dilation attempts, PUD, HTN, Dyslipidemia, Nicotine Abuse, and known CAD S/p multiple stents placement in the past.  She developed complaints of chest pain with radiation down into her arm.  She originally presented to the ED on 02/01/2024.  She felt her wait was too long and she left prior to being seen.  She contacted her Cardiologist office and was evaluated by Jari Favre PA-C whom recommended addition of Ranexa for symptom control.  She also felt patient undergo Myoview stress test.  Unfortunately she was unable to afford Ranexa and it was never taken.  She again  presented to the ED on 02/06/2024 with complaints of severe stomach pain, dizziness, mild chest pain that developed several hours prior to presentation.  She took 3 NTG with relief of pain. She was also experiencing shortness of breath, cough, N/V, abdominal pain and headache.  EKG obtained showed no ST elevation, but she did have elevated Troponin levels.  CT scan of her abdomen showed some free fluid in the upper abdomen.  It was felt she would require further care and she was transferred to [facility] for further care.  Upon arrival to [facility] Cardiology consult was obtained who recommended Echocardiogram and cardiac catheterization.  GI  consult was obtained, barium swallow showed no evidence of esophageal injury.  He felt that endoscopy would not be indicated.  He felt she could be managed conservatively and they signed off. Echocardiogram was obtained and showed mildly reduced EF and no valvular abnormalities.  Cardiac catheterization was obtained and revealed severe multivessel CAD.  She had ongoing chest pain post procedure and IABP was placed.  She was admitted to the ICU and Cardiothoracic surgery consultation was requested.   Hospital Course:  Meghan Lynn remained chest pain free.  She was evaluated by Dr. Dorris Fetch who was in agreement the patient would benefit from coronary bypass grafting procedure.  The risks and benefits of the procedure were explained to the patient and she was agreeable to proceed.  She was taken to the operating room on 02/10/2024.  She underwent CABG x 3 utilizing LIMA to LAD, SVG to OM, and SVG to PDA as well as endoscopic vein harvest of the greater saphenous vein from her right leg.  She tolerated the procedure without difficulty and was taken to the SICU in stable condition. She was extubated around 2AM on POD1. Drips were weaned as hemodynamics tolerated. IABP was removed on POD1. She was volume overload due to heart failure caused by ischemic cardiomyopathy. She was aggressively diuresed per AHF team. She developed likely reactive leukocytosis with WBC up to 23,000 which was monitored clinically. She was started on aggressive pulmonary hygiene for pulmonary congestion.  She required aggressive pulmonary toilet.  She developed an AKI with creatinine as high as 1.71, this improved with time. Chest tubes were removed without complication. Her blood pressure improved, She was started on GDMT as tolerated.  The patient was weaned down on oxygen requirements.  PT/OT consult was obtained and they recommended cardiac rehabilitation once patient is cleared to participate.  She has responded well to diuretics and  this has been discontinued.  She continues to complain of persistent chest soreness and shortness of breath with minimal activity despite being on supplemental oxygen at 4 L per nasal cannula.  Chest x-ray showed improvement in pulmonary vascular congestion but persistent mild cardiomegaly.  A noncontrast CT scan obtained on postop day 9 for further evaluation of her shortness of breath.  This study showed trace bilateral pleural effusions, underlying centrolobular emphysema, and a small fluid collection in the anterior mediastinum related to recent surgery.   Her oral intake has been poor.  Ensure was added to help with nutritional status.  She has been unable to wean fully off oxygen.  She has been weaned down to 2L via [place] and home use has been arranged.  Her surgical incisions are healing without evidence of infection.  She is ambulating with assistance.  She is medically stable for discharge home today.  Consults: cardiology  Significant Diagnostic Studies: angiography:     Prox RCA  to Dist RCA lesion is 100% stenosed.   Dist LAD lesion is 99% stenosed.   Prox Cx to Mid Cx lesion is 80% stenosed.   Mid Cx to Dist Cx lesion is 70% stenosed.   Dist Cx lesion is 90% stenosed.   1.  Severe multivessel disease with TIMI II flow in the distal LAD.  Due to ongoing chest pain, an intra-aortic balloon pump was placed and the patient was rendered chest pain-free. 2.  LVEDP of 13 mmHg.   Recommendation: The case was discussed with Dr. Shirlee Latch who will assume care.  The patient will be transferred to the 2 heart unit and a cardiothoracic surgical consult will be obtained.   Treatments: surgery:   NAME: Meghan Lynn, Meghan Lynn MEDICAL RECORD NO: [id number] ACCOUNT NO: [id number] DATE OF BIRTH: December 27, 1951 FACILITY: [facility] LOCATION: [facility] PHYSICIAN: Salvatore Decent. Dorris Fetch, MD   Operative Report    DATE OF PROCEDURE: 02/10/2024   PREOPERATIVE DIAGNOSIS:  Three-vessel coronary disease.   POSTOPERATIVE  DIAGNOSIS:  Three-vessel coronary disease.   PROCEDURE:   Median sternotomy, extracorporeal circulation,  Coronary artery bypass grafting x 3  Left internal mammary artery to the LAD,  Saphenous vein graft to obtuse marginal,  Saphenous vein graft to posterior descending] Endoscopic vein harvest right leg.   SURGEON:  Salvatore Decent. Dorris Fetch, MD   ASSISTANT:  Gershon Crane, PA  Discharge Exam: Blood pressure 111/69, pulse 87, temperature 97.8 F (36.6 C), temperature source Oral, resp. rate (!) 23, height 5\' 1"  (1.549 m), weight 59.4 kg, SpO2 90%.  General appearance: alert, cooperative, and no distress Heart: regular rate and rhythm Lungs: diminished breath sounds bibasilar Abdomen: soft, non-tender; bowel sounds normal; no masses,  no organomegaly Extremities: edema trace Wound: clean and dry   Discharge Medications:  The patient has been discharged on:   1.Beta Blocker:  Yes [ X  ]                              No   [   ]                              If No, reason:  2.Ace Inhibitor/ARB: Yes [   ]                                     No  [ X   ]                                     If No, reason:  3.Statin:   Yes [   ]                  No  [ x  ]                  If No, reason: intolerance  4.Meghan Lynn  [ X  ]                  No   [   ]                  If No, reason:  Patient had ACS upon admission: Yes  Plavix/P2Y12 inhibitor: Yes [ X  ]  No  [   ]     Discharge Instructions     Amb Referral to Cardiac Rehabilitation   Complete by: As directed    Diagnosis:  CABG NSTEMI     CABG X ___: 3   After initial evaluation and assessments completed: Virtual Based Care may be provided alone or in conjunction with Phase 2 Cardiac Rehab based on patient barriers.: Yes   Intensive Cardiac Rehabilitation (ICR) [facility] location only OR Traditional Cardiac Rehabilitation (TCR) *If criteria for ICR are not met will enroll in TCR [facility] only):  Yes      Allergies as of 02/21/2024       Reactions   Atorvastatin Other (See Comments)   Muscle cramping   Latex Itching   Propoxyphene Nausea And Vomiting   Darvocet        Medication List     STOP taking these medications    amLODipine 5 MG tablet Commonly known as: NORVASC   isosorbide mononitrate 30 MG 24 hr tablet Commonly known as: IMDUR   ranolazine 500 MG 12 hr tablet Commonly known as: Ranexa   valsartan 320 MG tablet Commonly known as: DIOVAN       TAKE these medications    albuterol 108 (90 Base) MCG/ACT inhaler Commonly known as: VENTOLIN HFA Inhale 2 puffs into the lungs every 6 (six) hours as needed for wheezing or shortness of breath.   Anoro Ellipta 62.5-25 MCG/ACT Aepb Generic drug: umeclidinium-vilanterol Inhale 1 puff into the lungs daily.   aspirin EC 81 MG tablet Take 1 tablet (81 mg total) by mouth daily. Swallow whole.   Biotin 1 MG Caps Take 1 mg by mouth daily.   carvedilol 3.125 MG tablet Commonly known as: COREG Take 1 tablet (3.125 mg total) by mouth 2 (two) times daily with a meal. What changed:  medication strength how much to take additional instructions   clopidogrel 75 MG tablet Commonly known as: PLAVIX Take 1 tablet (75 mg total) by mouth daily.   dapagliflozin propanediol 10 MG Tabs tablet Commonly known as: FARXIGA Take 1 tablet (10 mg total) by mouth daily.   feeding supplement Liqd Take 237 mLs by mouth 3 (three) times daily with meals.   furosemide 40 MG tablet Commonly known as: LASIX Take 1 tablet (40 mg total) by mouth daily. X 7 days, then transition to daily as needed for weight gain of 3-5 lbs in 24-48 hours   multivitamin with minerals Tabs tablet Take 1 tablet by mouth daily.   Nexlizet 180-10 MG Tabs Generic drug: Bempedoic Acid-Ezetimibe Take 1 tablet by mouth at bedtime.   nitroGLYCERIN 0.4 MG SL tablet Commonly known as: NITROSTAT Place 1 tablet (0.4 mg total) under the tongue every  5 (five) minutes as needed for chest pain.   omeprazole 20 MG capsule Commonly known as: PRILOSEC Take 20 mg by mouth daily.   oxyCODONE 5 MG immediate release tablet Commonly known as: Oxy IR/ROXICODONE Take 1 tablet (5 mg total) by mouth every 4 (four) hours as needed for severe pain (pain score 7-10).   potassium chloride SA 20 MEQ tablet Commonly known as: KLOR-CON M Take 1 tablet (20 mEq total) by mouth daily. X 7 days, then use only on days you take Lasix   predniSONE 10 MG tablet Commonly known as: DELTASONE Take 1 tablet (10 mg total) by mouth daily before breakfast.   spironolactone 25 MG tablet Commonly known as: ALDACTONE Take 0.5 tablets (12.5 mg total) by mouth daily.  Durable Medical Equipment  (From admission, onward)           Start     Ordered   02/17/24 0726  For home use only DME oxygen  Once       Question Answer Comment  Length of Need 6 Months   Mode or (Route) Nasal cannula   Liters per Minute 3   Frequency Continuous (stationary and portable oxygen unit needed)   Oxygen delivery system Gas      02/17/24 0726   02/16/24 1250  For home use only DME oxygen  Once       Comments: POC   Heart Failure  Question Answer Comment  Length of Need Lifetime   Mode or (Route) Nasal cannula   Liters per Minute 3   Frequency Continuous (stationary and portable oxygen unit needed)   Oxygen conserving device Yes   Oxygen delivery system Gas      02/16/24 1251            Follow-up Information     [name], MD Follow up on 03/06/2024.   Specialty: Cardiothoracic Surgery Why: Follow up appointment is at 2:30PM Contact information: [address] [employer] [address] [place] [address] [phone]         [facility] Follow up on 03/06/2024.   Why: To get CXR at 1:30PM, 1 hour prior to your appointment Contact information: [address] [place] [address]        Joylene Grapes,  NP Follow up on 03/05/2024.   Specialties: Cardiology, Family Medicine Why: Cardiology appointment is at 2:20PM Contact information: [address] [place] [address] [phone]         [facility] Follow up.   Why: Home Oxygen Contact information: [address] [place] [address] [phone]         [facility] Follow up.   Why: Home Health RN and Physical Therapy-agency will call to arrange visits Contact information: [phone]        Salvatore Decent, FNP Follow up.   Specialty: Internal Medicine Why: March 23, 2024 at 9 am. please arrive 15 minutes prior to appointment Contact information: [address] [place] [address] [phone]                 Signed:  Lowella Dandy, PA-C  02/21/2024, 8:41 AM

## 2024-02-10 NOTE — Anesthesia Procedure Notes (Signed)
Arterial Line Insertion Start/End2/14/2025 7:05 AM, 02/10/2024 7:10 AM Performed by: Waynard Edwards, CRNA, CRNA  Patient location: Pre-op. Preanesthetic checklist: patient identified, IV checked, site marked, risks and benefits discussed, surgical consent, monitors and equipment checked, pre-op evaluation, timeout performed and anesthesia consent Lidocaine 1% used for infiltration and patient sedated Right, radial was placed Catheter size: 20 G Hand hygiene performed , maximum sterile barriers used  and Seldinger technique used Allen's test indicative of satisfactory collateral circulation Attempts: 1 Procedure performed without using ultrasound guided technique. Following insertion, dressing applied and Biopatch. Post procedure assessment: normal and unchanged  Patient tolerated the procedure well with no immediate complications.

## 2024-02-10 NOTE — Anesthesia Procedure Notes (Signed)
Central Venous Catheter Insertion Performed by: Achille Rich, MD, anesthesiologist Start/End2/14/2025 7:14 AM, 02/10/2024 7:16 AM Patient location: Pre-op. Preanesthetic checklist: patient identified, IV checked, site marked, risks and benefits discussed, surgical consent, monitors and equipment checked, pre-op evaluation, timeout performed and anesthesia consent Hand hygiene performed  and maximum sterile barriers used  PA cath was placed.Swan type:thermodilution Procedure performed without using ultrasound guided technique. Attempts: 1 Patient tolerated the procedure well with no immediate complications.

## 2024-02-10 NOTE — Interval H&P Note (Signed)
History and Physical Interval Note:  02/10/2024 7:12 AM  Meghan Lynn  has presented today for surgery, with the diagnosis of CAD.  The various methods of treatment have been discussed with the patient and family. After consideration of risks, benefits and other options for treatment, the patient has consented to  Procedure(s): CORONARY ARTERY BYPASS GRAFTING (CABG) (N/A) TRANSESOPHAGEAL ECHOCARDIOGRAM (TEE) (N/A) as a surgical intervention.  The patient's history has been reviewed, patient examined, no change in status, stable for surgery.  I have reviewed the patient's chart and labs.  Questions were answered to the patient's satisfaction.     [name]

## 2024-02-10 NOTE — Hospital Course (Addendum)
 History of Present Illness:  Meghan Lynn is a 73 yo AA female with history of GERD, Esophageal Stricture with recent failed dilation attempts, PUD, HTN, Dyslipidemia, Nicotine Abuse, and known CAD S/p multiple stents placement in the past.  She developed complaints of chest pain with radiation down into her arm.  She originally presented to the ED on 02/01/2024.  She felt her wait was too long and she left prior to being seen.  She contacted her Cardiologist office and was evaluated by Meghan Favre PA-C whom recommended addition of Ranexa for symptom control.  She also felt patient undergo Myoview stress test.  Unfortunately she was unable to afford Ranexa and it was never taken.  She again presented to the ED on 02/06/2024 with complaints of severe stomach pain, dizziness, mild chest pain that developed several hours prior to presentation.  She took 3 NTG with relief of pain. She was also experiencing shortness of breath, cough, N/V, abdominal pain and headache.  EKG obtained showed no ST elevation, but she did have elevated Troponin levels.  CT scan of her abdomen showed some free fluid in the upper abdomen.  It was felt she would require further care and she was transferred to [facility] for further care.  Upon [facility] to [facility] Cardiology consult was obtained who recommended Echocardiogram and cardiac catheterization.  GI consult was obtained, barium swallow showed no evidence of esophageal injury.  He felt that endoscopy would not be indicated.  He felt she could be managed conservatively and they signed off. Echocardiogram was obtained and showed mildly reduced EF and no valvular abnormalities.  Cardiac catheterization was obtained and revealed severe multivessel CAD.  She had ongoing chest pain post procedure and IABP was placed.  She was admitted to the ICU and Cardiothoracic surgery consultation was requested.   Hospital Course:  Meghan Lynn remained chest pain free.  She was evaluated by  Dr. Dorris Fetch who was in agreement the patient would benefit from coronary bypass grafting procedure.  The risks and benefits of the procedure were explained to the patient and she was agreeable to proceed.  She was taken to the operating room on 02/10/2024.  She underwent CABG x 3 utilizing LIMA to LAD, SVG to OM, and SVG to PDA as well as endoscopic vein harvest of the greater saphenous vein from her right leg.  She tolerated the procedure without difficulty and was taken to the SICU in stable condition. She was extubated around 2AM on POD1. Drips were weaned as hemodynamics tolerated. IABP was removed on POD1. She was volume overload due to heart failure caused by ischemic cardiomyopathy. She was aggressively diuresed per AHF team. She developed likely reactive leukocytosis with WBC up to 23,000 which was monitored clinically. She was started on aggressive pulmonary hygiene for pulmonary congestion.  She required aggressive pulmonary toilet.  She developed an AKI with creatinine as high as 1.71, this improved with time. Chest tubes were removed without complication. Her blood pressure improved, She was started on GDMT as tolerated.  The patient was weaned down on oxygen requirements.  She will require home use vis [place] at 3L which has been arranged.  PT/OT consult was obtained and they recommended cardiac rehabilitation once patient is cleared to participate.  She has responded well to diuretics and this has been discontinued.  She continues to complain of persistent chest soreness and shortness of breath with minimal activity despite being on supplemental oxygen at 4 L per nasal cannula.  Chest  x-ray showed improvement in pulmonary vascular congestion but persistent mild cardiomegaly.  A noncontrast CT scan obtained on postop day 9 for further evaluation of her shortness of breath.  This study showed***.    Her oral intake has been poor.  Ensure was added to help with nutritional status.  Her surgical incisions  are healing without evidence of infection.  She is ambulating with assistance.  She is medically stable for discharge home today.

## 2024-02-10 NOTE — Op Note (Signed)
 NAME: LAKELY, ELMENDORF MEDICAL RECORD NO: [id number] ACCOUNT NO: [id number] DATE OF BIRTH: [birth year] FACILITY: [facility] LOCATION: [facility] PHYSICIAN: Salvatore Decent. Dorris Fetch, MD  Operative Report   DATE OF PROCEDURE: 02/10/2024  PREOPERATIVE DIAGNOSIS:  Three-vessel coronary disease.  POSTOPERATIVE DIAGNOSIS:  Three-vessel coronary disease.  PROCEDURE:   Median sternotomy, extracorporeal circulation,  Coronary artery bypass grafting x 3  Left internal mammary artery to the LAD,  Saphenous vein graft to obtuse marginal,  Saphenous vein graft to posterior descending] Endoscopic vein harvest right leg.  SURGEON:  Salvatore Decent. Dorris Fetch, MD  ASSISTANT:  Gershon Crane, PA  Experienced assistance was necessary for this case due to surgical complexity.  Wayne Gold independently harvested the saphenous vein and closed the leg incisions, then assisted with exposure, retraction of delicate tissue, suctioning, and suture management during the anastomosis.  ANESTHESIA:  General.  FINDINGS:  Transesophageal echocardiography showed preserved left ventricular wall motion with no significant valvular pathology.  OM good quality. Distal OM branch too small to graft.  Posterior descending fair quality.  LAD good quality. Good quality conduits.  CLINICAL NOTE:  Ms. Chauca is a 73 year old woman with known coronary disease who presented with chest pain.  Cardiac catheterization revealed severe three-vessel coronary disease.  She was referred for coronary artery bypass grafting.  During her catheterization, she had ongoing chest pain and a balloon pump was placed.  Her pain was relieved, but the balloon pump does remain in place.  The indications, risks, benefits, and alternatives were discussed in detail with the patient.  She understood and accepted the risks and agreed to proceed.  OPERATIVE NOTE:  Ms. Loyal was brought to the operating room on 02/10/2024.  She had induction of general anesthesia and was  intubated.  Intravenous antibiotics were administered.  A Foley catheter was placed.  The chest, abdomen, and legs were prepped and draped in the usual sterile fashion.  Dr. Chaney Malling of anesthesia performed transesophageal echocardiography.  Please see his separately dictated note for full details of the procedure.    A timeout was performed.  A median sternotomy was performed and the left internal mammary artery was harvested using standard technique.  Simultaneously, an incision was made in the medial aspect of the right leg at the level of the knee.  The greater saphenous vein was harvested from the upper calf to the groin  endoscopically.  The saphenous vein was a good quality vessel as was the internal mammary artery.  2000 units of heparin was administered during the vessel harvest.  The remainder of the full heparin dose was given prior to opening the pericardium.  The pericardium was opened.  The ascending aorta was inspected.  It was of normal caliber with no significant atherosclerotic disease.  After confirming adequate anticoagulation with ACT measurement, the aorta was cannulated via concentric 2 Ethibond pledgeted pursestring sutures.  A dual-stage venous cannula was placed via a pursestring suture in the right atrial appendage.  Cardiopulmonary bypass was initiated.  Flows were maintained per protocol.  The patient was cooled to 32 degrees Celsius.  The coronary arteries were inspected and anastomotic sites were chosen.  The conduits were inspected and cut to length.  A foam pad was placed in the pericardium to insulate the heart.  A temperature probe was placed in the myocardial septum and a cardioplegia cannula was placed in the ascending aorta.  The aorta was cross-clamped.  The left ventricle was emptied via the aortic root vent.  Cardiac arrest then was achieved with  a combination of cold antegrade blood cardioplegia and topical iced saline.  1.5 L of cardioplegia was administered.  There  was a rapid diastolic arrest.  There was ultimately septal cooling to 13 degrees Celsius.  A reversed saphenous vein graft was placed end-to-side to the posterior descending branch of the right coronary.  This was a 1.5 mm fair quality target.  The vein was of good quality.  It was anastomosed end-to-side with a running 7-0 Prolene suture.  All anastomoses were probed proximally and distally at their completion to ensure patency.  Cardioplegia was administered down the graft and there was good flow and good hemostasis.  Next, a reversed saphenous vein graft was placed end-to-side to the first obtuse marginal branch of the left circumflex.  There was a large ramus intermedius branch that did not have disease.  There was significant disease distally in the circumflex.  The only graftable target was the first OM.  It was a 1.5 mm good quality target at the site of the anastomosis.  The vein was anastomosed end-to-side with a running 7-0 Prolene suture.  A probe passed easily proximally and distally.  Cardioplegia was administered down the graft and there was good flow and good hemostasis.  Additional cardioplegia was administered down the aortic root.  The left internal mammary artery was brought through a window in the pericardium.  The distal end was beveled.  It was then anastomosed end-to-side to the distal LAD.  The distal LAD was a 1.5 mm good quality target.  There was tight plaque proximal to the anastomosis, but no significant disease distal to it.  The end-to-side anastomosis was performed with a running 8-0 Prolene suture.  At the completion of the mammary to LAD anastomosis, the bulldog clamp was briefly removed to inspect for hemostasis.  Rapid septal rewarding was noted.  The bulldog clamp was replaced and the mammary pedicle was tacked to the epicardial surface of the harvest with 6-0 Prolene sutures.  The cardioplegia cannula was removed from the ascending aorta.  The proximal vein graft  anastomosis were performed to 4.0 mm punch aortotomies with running 6-0 Prolene sutures.  At the completion of the final proximal anastomosis, the patient was placed in Trendelenburg position.  Lidocaine was administered.  The aortic root was de-aired.  The bulldog clamp was again removed from the left mammary artery.  The aortic cross-clamp was removed.  The total crossclamp time was 53 minutes.  The patient initially was in heart block.  While rewarming was completed, all proximal and distal anastomosis were inspected for hemostasis.  Epicardial pacing wires were placed on the right ventricle and right atrium.  When the patient had rewarmed to a core temperature of 37 degrees Celsius.  She was weaned from cardiopulmonary bypass on the first attempt.  The total bypass time was 84 minutes.  The initial cardiac index was approximately 2 liters per minute per meter squared and the patient remained hemodynamically stable throughout the post-bypass period.  A test dose of protamine was administered and was well tolerated.  The atrial and aortic cannulae were removed.  The remainder of the protamine was administered without incident.  The chest was irrigated with warm saline.  Hemostasis was achieved.  The pericardium was not closed.  Left pleural and mediastinal chest tubes were placed through separate subcostal incisions.  The sternum was closed with a combination of single and double heavy gauge stainless steel wires.  Pectoralis fascia, subcutaneous tissue and skin were closed in standard  fashion.  All sponge, needle, and instrument counts were correct at the end of the procedure.  The patient was taken from the operating room to the surgical intensive care unit intubated and in fair  condition.  It should be noted that the patient did have significant emphysematous disease and there was a small air leak from the left upper lobe after taking down the mammary artery.  Progel was applied to this area prior to  chest closure.   VAI D: 02/10/2024 4:42:12 pm T: 02/10/2024 9:25:00 pm  JOB: [id number]/ [id number]

## 2024-02-10 NOTE — Plan of Care (Signed)
  Problem: Education: Goal: Ability to describe self-care measures that may prevent or decrease complications (Diabetes Survival Skills Education) will improve Outcome: Progressing   Problem: Coping: Goal: Ability to adjust to condition or change in health will improve Outcome: Progressing   Problem: Skin Integrity: Goal: Risk for impaired skin integrity will decrease Outcome: Progressing   Problem: Safety: Goal: Ability to remain free from injury will improve Outcome: Progressing   Problem: Skin Integrity: Goal: Risk for impaired skin integrity will decrease Outcome: Progressing   Problem: Physical Regulation: Goal: Complications related to the disease process, condition or treatment will be avoided or minimized Outcome: Progressing

## 2024-02-10 NOTE — Transfer of Care (Signed)
Immediate Anesthesia Transfer of Care Note  Patient: Meghan Lynn  Procedure(s) Performed: CORONARY ARTERY BYPASS GRAFTING (CABG) TIMES THREE USING LEFT INTERNAL MAMMARY ARTERY AND ENDOSCOPICALLY HARVESTED RIGHT GREATER SAPHENOUS VEIN. (Chest) TRANSESOPHAGEAL ECHOCARDIOGRAM (TEE)  Patient Location: ICU  Anesthesia Type:General  Level of Consciousness: sedated and Patient remains intubated per anesthesia plan  Airway & Oxygen Therapy: Patient remains intubated per anesthesia plan and Patient placed on Ventilator (see vital sign flow sheet for setting)  Post-op Assessment: Report given to RN and Post -op Vital signs reviewed and stable  Post vital signs: Reviewed and stable  Last Vitals:  Vitals Value Taken Time  BP 96/38 02/10/24  1237  Temp 35.5 C 02/10/24 1235  Pulse 86 02/10/24 1235  Resp 16 02/10/24 1235  SpO2 98 % 02/10/24 1235  Vitals shown include unfiled device data.  Last Pain:  Vitals:   02/10/24 0654  TempSrc:   PainSc: 0-No pain      Patients Stated Pain Goal: 0 (02/09/24 1630)  Complications: No notable events documented.

## 2024-02-10 NOTE — Anesthesia Procedure Notes (Signed)
Procedure Name: Intubation Date/Time: 02/10/2024 7:51 AM  Performed by: Waynard Edwards, CRNAPre-anesthesia Checklist: Patient identified, Emergency Drugs available, Suction available and Patient being monitored Patient Re-evaluated:Patient Re-evaluated prior to induction Oxygen Delivery Method: Circle system utilized Preoxygenation: Pre-oxygenation with 100% oxygen Induction Type: IV induction Ventilation: Mask ventilation without difficulty and Oral airway inserted - appropriate to patient size Laryngoscope Size: Hyacinth Meeker and 2 Grade View: Grade I Tube type: Oral Tube size: 7.0 mm Number of attempts: 1 Airway Equipment and Method: Stylet and Oral airway Placement Confirmation: ETT inserted through vocal cords under direct vision, positive ETCO2 and breath sounds checked- equal and bilateral Secured at: 21 cm Tube secured with: Tape Dental Injury: Teeth and Oropharynx as per pre-operative assessment

## 2024-02-10 NOTE — Progress Notes (Signed)
      [address]       [place] [address]             [phone]      S/p CABG x 3  Intubated, just starting to wake up  BP 112/74 (BP Location: Left Arm)   Pulse (!) 214   Temp 99.7 F (37.6 C)   Resp (!) 34   Ht 5\' 1"  (1.549 m)   Wt 62.5 kg   SpO2 94%   BMI 26.04 kg/m   34/19 CO 3.3, CI 2.0 on norepi 2   Intake/Output Summary (Last 24 hours) at 02/10/2024 1728 Last data filed at 02/10/2024 1600 Gross per 24 hour  Intake 3026.94 ml  Output 1602 ml  Net 1424.94 ml   ~ 200 ml from CT since OR Hgb 9.9, K 4.7  Doing well early postop Extubate when more alert Dc IABP tomorrow  Viviann Spare C. Dorris Fetch, MD [facility] [phone]

## 2024-02-10 NOTE — Anesthesia Preprocedure Evaluation (Signed)
Anesthesia Evaluation  Patient identified by MRN, date of birth, ID band Patient awake    Reviewed: Allergy & Precautions, H&P , NPO status , Patient's Chart, lab work & pertinent test results  Airway Mallampati: II   Neck ROM: full    Dental   Pulmonary shortness of breath, asthma , Current Smoker and Patient abstained from smoking.   breath sounds clear to auscultation       Cardiovascular hypertension, + angina  + CAD and + Past MI   Rhythm:regular Rate:Normal  TTE (02/08/24): EF 45-50%, severe HK in anterior and anteroseptal walls.   Neuro/Psych  Headaches    GI/Hepatic PUD,GERD  ,,  Endo/Other    Renal/GU      Musculoskeletal  (+) Arthritis ,    Abdominal   Peds  Hematology   Anesthesia Other Findings   Reproductive/Obstetrics                             Anesthesia Physical Anesthesia Plan  ASA: 3  Anesthesia Plan: General   Post-op Pain Management:    Induction: Intravenous  PONV Risk Score and Plan: 2 and Ondansetron, Dexamethasone, Midazolam and Treatment may vary due to age or medical condition  Airway Management Planned: Oral ETT  Additional Equipment: Arterial line, CVP, PA Cath, TEE and Ultrasound Guidance Line Placement  Intra-op Plan:   Post-operative Plan:   Informed Consent: I have reviewed the patients History and Physical, chart, labs and discussed the procedure including the risks, benefits and alternatives for the proposed anesthesia with the patient or authorized representative who has indicated his/her understanding and acceptance.     Dental advisory given  Plan Discussed with: CRNA, Anesthesiologist and Surgeon  Anesthesia Plan Comments:        Anesthesia Quick Evaluation

## 2024-02-11 ENCOUNTER — Inpatient Hospital Stay (HOSPITAL_COMMUNITY): Payer: Medicare HMO

## 2024-02-11 DIAGNOSIS — I214 Non-ST elevation (NSTEMI) myocardial infarction: Secondary | ICD-10-CM | POA: Diagnosis not present

## 2024-02-11 LAB — POCT I-STAT 7, (LYTES, BLD GAS, ICA,H+H)
Acid-base deficit: 2 mmol/L (ref 0.0–2.0)
Acid-base deficit: 2 mmol/L (ref 0.0–2.0)
Acid-base deficit: 2 mmol/L (ref 0.0–2.0)
Acid-base deficit: 2 mmol/L (ref 0.0–2.0)
Acid-base deficit: 3 mmol/L — ABNORMAL HIGH (ref 0.0–2.0)
Bicarbonate: 21.5 mmol/L (ref 20.0–28.0)
Bicarbonate: 22.2 mmol/L (ref 20.0–28.0)
Bicarbonate: 23 mmol/L (ref 20.0–28.0)
Bicarbonate: 23 mmol/L (ref 20.0–28.0)
Bicarbonate: 23.9 mmol/L (ref 20.0–28.0)
Calcium, Ion: 1.18 mmol/L (ref 1.15–1.40)
Calcium, Ion: 1.2 mmol/L (ref 1.15–1.40)
Calcium, Ion: 1.21 mmol/L (ref 1.15–1.40)
Calcium, Ion: 1.21 mmol/L (ref 1.15–1.40)
Calcium, Ion: 1.21 mmol/L (ref 1.15–1.40)
HCT: 31 % — ABNORMAL LOW (ref 36.0–46.0)
HCT: 32 % — ABNORMAL LOW (ref 36.0–46.0)
HCT: 32 % — ABNORMAL LOW (ref 36.0–46.0)
HCT: 32 % — ABNORMAL LOW (ref 36.0–46.0)
HCT: 32 % — ABNORMAL LOW (ref 36.0–46.0)
Hemoglobin: 10.5 g/dL — ABNORMAL LOW (ref 12.0–15.0)
Hemoglobin: 10.9 g/dL — ABNORMAL LOW (ref 12.0–15.0)
Hemoglobin: 10.9 g/dL — ABNORMAL LOW (ref 12.0–15.0)
Hemoglobin: 10.9 g/dL — ABNORMAL LOW (ref 12.0–15.0)
Hemoglobin: 10.9 g/dL — ABNORMAL LOW (ref 12.0–15.0)
O2 Saturation: 87 %
O2 Saturation: 89 %
O2 Saturation: 90 %
O2 Saturation: 91 %
O2 Saturation: 96 %
Patient temperature: 37.1
Patient temperature: 37.4
Patient temperature: 37.5
Patient temperature: 37.6
Patient temperature: 37.6
Potassium: 4.5 mmol/L (ref 3.5–5.1)
Potassium: 4.6 mmol/L (ref 3.5–5.1)
Potassium: 4.7 mmol/L (ref 3.5–5.1)
Potassium: 4.8 mmol/L (ref 3.5–5.1)
Potassium: 5.1 mmol/L (ref 3.5–5.1)
Sodium: 138 mmol/L (ref 135–145)
Sodium: 139 mmol/L (ref 135–145)
Sodium: 139 mmol/L (ref 135–145)
Sodium: 139 mmol/L (ref 135–145)
Sodium: 139 mmol/L (ref 135–145)
TCO2: 23 mmol/L (ref 22–32)
TCO2: 23 mmol/L (ref 22–32)
TCO2: 24 mmol/L (ref 22–32)
TCO2: 24 mmol/L (ref 22–32)
TCO2: 25 mmol/L (ref 22–32)
pCO2 arterial: 37.5 mm[Hg] (ref 32–48)
pCO2 arterial: 38.1 mm[Hg] (ref 32–48)
pCO2 arterial: 39.4 mm[Hg] (ref 32–48)
pCO2 arterial: 41.3 mm[Hg] (ref 32–48)
pCO2 arterial: 45.3 mm[Hg] (ref 32–48)
pH, Arterial: 7.331 — ABNORMAL LOW (ref 7.35–7.45)
pH, Arterial: 7.356 (ref 7.35–7.45)
pH, Arterial: 7.362 (ref 7.35–7.45)
pH, Arterial: 7.376 (ref 7.35–7.45)
pH, Arterial: 7.382 (ref 7.35–7.45)
pO2, Arterial: 55 mm[Hg] — ABNORMAL LOW (ref 83–108)
pO2, Arterial: 59 mm[Hg] — ABNORMAL LOW (ref 83–108)
pO2, Arterial: 60 mm[Hg] — ABNORMAL LOW (ref 83–108)
pO2, Arterial: 67 mm[Hg] — ABNORMAL LOW (ref 83–108)
pO2, Arterial: 88 mm[Hg] (ref 83–108)

## 2024-02-11 LAB — BASIC METABOLIC PANEL
Anion gap: 9 (ref 5–15)
Anion gap: 13 (ref 5–15)
BUN: 9 mg/dL (ref 8–23)
BUN: 11 mg/dL (ref 8–23)
CO2: 21 mmol/L — ABNORMAL LOW (ref 22–32)
CO2: 20 mmol/L — ABNORMAL LOW (ref 22–32)
Calcium: 8.4 mg/dL — ABNORMAL LOW (ref 8.9–10.3)
Calcium: 8.4 mg/dL — ABNORMAL LOW (ref 8.9–10.3)
Chloride: 106 mmol/L (ref 98–111)
Chloride: 103 mmol/L (ref 98–111)
Creatinine, Ser: 1.16 mg/dL — ABNORMAL HIGH (ref 0.44–1.00)
Creatinine, Ser: 1.32 mg/dL — ABNORMAL HIGH (ref 0.44–1.00)
GFR, Estimated: 50 mL/min — ABNORMAL LOW (ref >60)
GFR, Estimated: 43 mL/min — ABNORMAL LOW (ref >60)
Glucose, Bld: 128 mg/dL — ABNORMAL HIGH (ref 70–99)
Glucose, Bld: 137 mg/dL — ABNORMAL HIGH (ref 70–99)
Potassium: 4.5 mmol/L (ref 3.5–5.1)
Potassium: 4.3 mmol/L (ref 3.5–5.1)
Sodium: 136 mmol/L (ref 135–145)
Sodium: 136 mmol/L (ref 135–145)

## 2024-02-11 LAB — CBC
HCT: 31 % — ABNORMAL LOW (ref 36.0–46.0)
HCT: 28.4 % — ABNORMAL LOW (ref 36.0–46.0)
Hemoglobin: 9.7 g/dL — ABNORMAL LOW (ref 12.0–15.0)
Hemoglobin: 9.1 g/dL — ABNORMAL LOW (ref 12.0–15.0)
MCH: 25.5 pg — ABNORMAL LOW (ref 26.0–34.0)
MCH: 25.9 pg — ABNORMAL LOW (ref 26.0–34.0)
MCHC: 31.3 g/dL (ref 30.0–36.0)
MCHC: 32 g/dL (ref 30.0–36.0)
MCV: 81.4 fL (ref 80.0–100.0)
MCV: 80.9 fL (ref 80.0–100.0)
Platelets: 104 10*3/uL — ABNORMAL LOW (ref 150–400)
Platelets: 94 10*3/uL — ABNORMAL LOW (ref 150–400)
RBC: 3.81 MIL/uL — ABNORMAL LOW (ref 3.87–5.11)
RBC: 3.51 MIL/uL — ABNORMAL LOW (ref 3.87–5.11)
RDW: 14 % (ref 11.5–15.5)
RDW: 14.2 % (ref 11.5–15.5)
WBC: 19.2 10*3/uL — ABNORMAL HIGH (ref 4.0–10.5)
WBC: 20.3 10*3/uL — ABNORMAL HIGH (ref 4.0–10.5)
nRBC: 0 % (ref 0.0–0.2)
nRBC: 0 % (ref 0.0–0.2)

## 2024-02-11 LAB — COOXEMETRY PANEL
Carboxyhemoglobin: 1.5 % (ref 0.5–1.5)
Methemoglobin: 0.8 % (ref 0.0–1.5)
O2 Saturation: 61.6 %
Total hemoglobin: 8.5 g/dL — ABNORMAL LOW (ref 12.0–16.0)

## 2024-02-11 LAB — MAGNESIUM
Magnesium: 2.8 mg/dL — ABNORMAL HIGH (ref 1.7–2.4)
Magnesium: 2.5 mg/dL — ABNORMAL HIGH (ref 1.7–2.4)

## 2024-02-11 LAB — GLUCOSE, CAPILLARY
Glucose-Capillary: 127 mg/dL — ABNORMAL HIGH (ref 70–99)
Glucose-Capillary: 157 mg/dL — ABNORMAL HIGH (ref 70–99)
Glucose-Capillary: 173 mg/dL — ABNORMAL HIGH (ref 70–99)
Glucose-Capillary: 147 mg/dL — ABNORMAL HIGH (ref 70–99)
Glucose-Capillary: 172 mg/dL — ABNORMAL HIGH (ref 70–99)
Glucose-Capillary: 135 mg/dL — ABNORMAL HIGH (ref 70–99)
Glucose-Capillary: 142 mg/dL — ABNORMAL HIGH (ref 70–99)
Glucose-Capillary: 123 mg/dL — ABNORMAL HIGH (ref 70–99)
Glucose-Capillary: 129 mg/dL — ABNORMAL HIGH (ref 70–99)
Glucose-Capillary: 133 mg/dL — ABNORMAL HIGH (ref 70–99)
Glucose-Capillary: 144 mg/dL — ABNORMAL HIGH (ref 70–99)
Glucose-Capillary: 114 mg/dL — ABNORMAL HIGH (ref 70–99)

## 2024-02-11 LAB — CULTURE, BLOOD (ROUTINE X 2)
Culture: NO GROWTH
Culture: NO GROWTH

## 2024-02-11 MED ORDER — FUROSEMIDE 10 MG/ML IJ SOLN
20.0000 mg | Freq: Two times a day (BID) | INTRAMUSCULAR | Status: DC
  Filled 2024-02-11: qty 2

## 2024-02-11 MED ORDER — INSULIN ASPART 100 UNIT/ML IJ SOLN
0.0000 [IU] | INTRAMUSCULAR | Status: DC
  Administered 2024-02-11: 2 [IU] via SUBCUTANEOUS
  Administered 2024-02-11: 4 [IU] via SUBCUTANEOUS
  Administered 2024-02-11 – 2024-02-12 (×2): 2 [IU] via SUBCUTANEOUS

## 2024-02-11 MED ORDER — ALBUMIN HUMAN 5 % IV SOLN
12.5000 g | Freq: Once | INTRAVENOUS | Status: AC
  Administered 2024-02-11: 12.5 g via INTRAVENOUS

## 2024-02-11 MED ORDER — CHLORHEXIDINE GLUCONATE CLOTH 2 % EX PADS
6.0000 | MEDICATED_PAD | Freq: Every day | CUTANEOUS | Status: DC
  Administered 2024-02-11 – 2024-02-21 (×11): 6 via TOPICAL

## 2024-02-11 MED ORDER — FUROSEMIDE 10 MG/ML IJ SOLN
40.0000 mg | Freq: Two times a day (BID) | INTRAMUSCULAR | Status: AC
  Administered 2024-02-11 (×2): 40 mg via INTRAVENOUS
  Filled 2024-02-11 (×2): qty 4

## 2024-02-11 MED ORDER — INSULIN GLARGINE-YFGN 100 UNIT/ML ~~LOC~~ SOLN
15.0000 [IU] | Freq: Two times a day (BID) | SUBCUTANEOUS | Status: DC
  Administered 2024-02-11 – 2024-02-12 (×4): 15 [IU] via SUBCUTANEOUS
  Filled 2024-02-11 (×6): qty 0.15

## 2024-02-11 MED ORDER — ALPRAZOLAM 0.25 MG PO TABS
0.2500 mg | ORAL_TABLET | Freq: Three times a day (TID) | ORAL | Status: DC | PRN
  Administered 2024-02-11 – 2024-02-12 (×4): 0.25 mg via ORAL
  Filled 2024-02-11 (×4): qty 1

## 2024-02-11 NOTE — Progress Notes (Signed)
      [address]       [place] [address]             [phone]      POD # 1  Up in chair Pain control better  BP 109/66   Pulse 91   Temp 99.3 F (37.4 C)   Resp (!) 21   Ht 5\' 1"  (1.549 m)   Wt 68.9 kg   SpO2 94%   BMI 28.70 kg/m  HFNC 10L down form 15 94% sat   Intake/Output Summary (Last 24 hours) at 02/11/2024 1739 Last data filed at 02/11/2024 1700 Gross per 24 hour  Intake 2161.3 ml  Output 1305 ml  Net 856.3 ml   Creatinine 1.32 K 4.3  Doing well POD # 1  Avalynne Diver C. Dorris Fetch, MD [facility] [phone]

## 2024-02-11 NOTE — Procedures (Signed)
 Extubation Procedure Note  Patient Details:   Name: Meghan Lynn DOB: 03/31/1951 MRN: [id number]   Airway Documentation:    Vent end date: 02/11/24 Vent end time: 0220   Evaluation  O2 sats: stable throughout Complications: No apparent complications Patient did tolerate procedure well. Bilateral Breath Sounds: Clear, Diminished   Yes NIF -25  VC 1.09 Pt extubated through rapid wean protocol to 4L [place] . VSS throughout. No stridor noted, positive cuff leak. RT and RN @ bedside.  Paschal Dopp 02/11/2024, 0220AM

## 2024-02-11 NOTE — Plan of Care (Signed)
 IABP discontinued today. OOB to chair. Attempting to wean O2 now. Currently on 10L HFNC.

## 2024-02-11 NOTE — Progress Notes (Signed)
 Attempted to wean from vent. Co2 53 did not meet criteria. Pt awake and coughing consistantly, grabbing at her throat, and moving her hands towards the face, while trying to speak over vent. RN and RT attempted diversion and calming techniques, however attempts where unsuccessful. Versed for vent management was given. Dr Dorris Fetch made aware. Advised to begin weaning again once criteria met.

## 2024-02-11 NOTE — Progress Notes (Signed)
 Patient ID: Meghan Lynn, female   DOB: 07-07-1951, 73 y.o.   MRN: [id number]     Advanced Heart Failure Rounding Note  Cardiologist: Chrystie Nose, MD   Chief Complaint: NSTEMI  Subjective:    IABP at 1:3 this morning on NE 3.     Pain at IABP site.   Swan: CVP 19 PA 32/18 CI 1.9   Objective:   Weight Range: 68.9 kg Body mass index is 28.7 kg/m.   Vital Signs:   Temp:  [95.5 F (35.3 C)-99.7 F (37.6 C)] 99.1 F (37.3 C) (02/15 0900) Pulse Rate:  [82-282] 93 (02/15 0900) Resp:  [14-34] 20 (02/15 0900) BP: (80-161)/(42-113) 90/71 (02/15 0900) SpO2:  [86 %-100 %] 96 % (02/15 0900) Arterial Line BP: (81-180)/(27-75) 102/44 (02/15 0900) FiO2 (%):  [40 %-60 %] 40 % (02/14 2313) Weight:  [68.9 kg] 68.9 kg (02/15 0546) Last BM Date : 02/05/24  Weight change: Filed Weights   02/07/24 1300 02/11/24 0500 02/11/24 0546  Weight: 62.5 kg 68.9 kg 68.9 kg    Intake/Output:   Intake/Output Summary (Last 24 hours) at 02/11/2024 0957 Last data filed at 02/11/2024 0900 Gross per 24 hour  Intake 3828.19 ml  Output 2780 ml  Net 1048.19 ml      Physical Exam    General: NAD Neck: JVP 16+ cm, no thyromegaly or thyroid nodule.  Lungs: Clear to auscultation bilaterally with normal respiratory effort. CV: Nondisplaced PMI.  Heart regular S1/S2, no S3/S4, no murmur.  Trace ankle edema.  Abdomen: Soft, nontender, no hepatosplenomegaly, no distention.  Skin: Intact without lesions or rashes.  Neurologic: Alert and oriented x 3.  Psych: Normal affect. Extremities: No clubbing or cyanosis.  HEENT: Normal.   Telemetry   NSR 90s (personally reviewed)  Labs    CBC Recent Labs    02/10/24 1807 02/10/24 1814 02/11/24 0319 02/11/24 0457  WBC 21.0*  --   --  19.2*  HGB 10.6*   < > 10.5* 9.7*  HCT 33.7*   < > 31.0* 31.0*  MCV 82.8  --   --  81.4  PLT 118*  --   --  104*   < > = values in this interval not displayed.   Basic Metabolic Panel Recent Labs     02/10/24 1807 02/10/24 1814 02/11/24 0319 02/11/24 0457  NA 137   < > 139 136  K 6.1*   < > 4.5 4.5  CL 104  --   --  106  CO2 24  --   --  21*  GLUCOSE 164*  --   --  128*  BUN 7*  --   --  9  CREATININE 1.05*  --   --  1.16*  CALCIUM 8.2*  --   --  8.4*  MG 3.5*  --   --  2.8*   < > = values in this interval not displayed.   Liver Function Tests No results for input(s): "AST", "ALT", "ALKPHOS", "BILITOT", "PROT", "ALBUMIN" in the last 72 hours.  No results for input(s): "LIPASE", "AMYLASE" in the last 72 hours.  Cardiac Enzymes No results for input(s): "CKTOTAL", "CKMB", "CKMBINDEX", "TROPONINI" in the last 72 hours.  BNP: BNP (last 3 results) No results for input(s): "BNP" in the last 8760 hours.  ProBNP (last 3 results) No results for input(s): "PROBNP" in the last 8760 hours.   D-Dimer No results for input(s): "DDIMER" in the last 72 hours. Hemoglobin A1C No results for input(s): "HGBA1C"  in the last 72 hours.  Fasting Lipid Panel No results for input(s): "CHOL", "HDL", "LDLCALC", "TRIG", "CHOLHDL", "LDLDIRECT" in the last 72 hours. Thyroid Function Tests No results for input(s): "TSH", "T4TOTAL", "T3FREE", "THYROIDAB" in the last 72 hours.  Invalid input(s): "FREET3"  Other results:   Imaging    DG Chest Port 1 View Result Date: 02/11/2024 CLINICAL DATA:  Status post CABG. EXAM: PORTABLE CHEST 1 VIEW COMPARISON:  02/10/2024 FINDINGS: Stable heart size status post CABG. Endotracheal and orogastric tubes have been removed. Swan-Ganz catheter in stable position with the tip in the main pulmonary artery. Mediastinal drain and left chest tube remain. No pneumothorax. Stable mild pulmonary interstitial prominence without overt airspace edema. No significant pleural effusions. The visualized skeletal structures are unremarkable. IMPRESSION: Interval removal of endotracheal and orogastric tubes. Stable mild pulmonary interstitial prominence without overt airspace  edema. Electronically Signed   By: Irish Lack M.D.   On: 02/11/2024 09:32   DG Chest Port 1 View Result Date: 02/10/2024 CLINICAL DATA:  Postop day 0 status post CABG EXAM: PORTABLE CHEST 1 VIEW COMPARISON:  02/09/2024 FINDINGS: Endotracheal tube tip 2.9 cm above the carina. Nasogastric tube enters the stomach. Right IJ Swan-Ganz catheter tip: Main pulmonary artery. Left chest tube right chest tube or mediastinal drain observed. No current pneumothorax. Intra-aortic balloon pump marker 6.4 cm below the bottom of the aortic arch. Mild subsegmental atelectasis or scarring along the left hemidiaphragm. Borderline enlargement of the cardiopericardial silhouette. Indistinct pulmonary vasculature favoring pulmonary venous hypertension, correlate with swollen pressures. No airspace edema identified. Atherosclerotic calcification of the aortic arch. IMPRESSION: 1. Support apparatus satisfactorily positioned. No pneumothorax or early complicating feature. 2. Borderline enlargement of the cardiopericardial silhouette with indistinct pulmonary vasculature favoring pulmonary venous hypertension, correlate with swollen pressures. 3. Mild subsegmental atelectasis or scarring along the left hemidiaphragm. Electronically Signed   By: Gaylyn Rong M.D.   On: 02/10/2024 15:35     Medications:     Scheduled Medications:  acetaminophen  1,000 mg Oral Q6H   Or   acetaminophen (TYLENOL) oral liquid 160 mg/5 mL  1,000 mg Per Tube Q6H   aspirin EC  325 mg Oral Daily   Or   aspirin  324 mg Per Tube Daily   bisacodyl  10 mg Oral Daily   Or   bisacodyl  10 mg Rectal Daily   Chlorhexidine Gluconate Cloth  6 each Topical Daily   docusate sodium  200 mg Oral Daily   furosemide  20 mg Intravenous BID   insulin aspart  0-24 Units Subcutaneous Q4H   insulin glargine-yfgn  15 Units Subcutaneous BID   metoCLOPramide (REGLAN) injection  10 mg Intravenous Q6H   [START ON 02/12/2024] pantoprazole  40 mg Oral Daily    pantoprazole (PROTONIX) IV  40 mg Intravenous QHS   sodium chloride flush  3 mL Intravenous Q12H   sodium chloride flush  3-10 mL Intravenous Q12H   umeclidinium-vilanterol  1 puff Inhalation Daily    Infusions:  sodium chloride 10 mL/hr at 02/11/24 0900   sodium chloride 1 mL/hr at 02/11/24 0900   albumin human Stopped (02/11/24 0518)    ceFAZolin (ANCEF) IV Stopped (02/11/24 5784)   insulin 0.5 Units/hr (02/11/24 0900)   lactated ringers 10 mL/hr at 02/11/24 0900   lactated ringers 10 mL/hr at 02/11/24 0900    PRN Medications: sodium chloride, albumin human, albuterol, ALPRAZolam, dextrose, metoprolol tartrate, morphine injection, ondansetron (ZOFRAN) IV, oxyCODONE, sodium chloride flush, sodium chloride flush, traMADol  Patient Profile   73 y.o. female with history of CAD, COPD, tobacco use, HLD, dysphagia. Admitted with NSTEMI. Found to have multivessel CAD on cath. IABP placed d/t ongoing angina.   Assessment/Plan   1. CAD: NSTEMI this admission, peak HS-TnI 762.  No significant ECG changes.  Patient has history of DES x 3 to RCA in 2014.  Coronary angiography 02/12 shows totally occluded RCA with collaterals, 80% proximal and 70% mid LCx stenosis, 90% distal LCx, 99% mid/distal LAD stenosis. CAD was thought most amenable to CABG.  IABP was placed for refractory angina, subsequent resolution.  Patient now s/p CABG with LIMA-LAD, SVG-OM, SVG-PDA on 2/14.  - Continue ASA.  - Patient is unable to tolerate statins, she is currently on Zetia at home.  She will need a PCSK9 inhibitor after discharge.  2. HF with mid range EF: Ischemic cardiomyopathy. Pre-op echo showed EF 45% with severe HK to AK of the mid-apical anteroseptal wall, the apical wall segments, and the true apex, the basal segments were hyperkinetic and there was evidence for a related outflow gradient. Now post-op with CI 1.9 and elevated CVP 19.  She is on IABP 1:3 with NE 3.  - Removing IABP now.  - Will send co-ox  after IABP out, suspect she will need milrinone.  - Lasix 40 mg IV bid today.  3. Dysphagia: Barium swallow showed no stricture.  4. COPD: Active smoker.  Counseled cessation. Continue Anoro inhaler.  5. Abdominal pain: No evidence for pancreatitis with normal lipase x 2.  GI has signed off.  6. ID: Blood cultures negative, afebrile.  She was initially on ceftriaxone/Flagyl, this appears to have been started for ?abdominal source. This was stopped pre-op with no clear indication.   CRITICAL CARE Performed by: Marca Ancona  Total critical care time: 35 minutes  Critical care time was exclusive of separately billable procedures and treating other patients.  Critical care was necessary to treat or prevent imminent or life-threatening deterioration.  Critical care was time spent personally by me on the following activities: development of treatment plan with patient and/or surrogate as well as nursing, discussions with consultants, evaluation of patient's response to treatment, examination of patient, obtaining history from patient or surrogate, ordering and performing treatments and interventions, ordering and review of laboratory studies, ordering and review of radiographic studies, pulse oximetry and re-evaluation of patient's condition.  Marca Ancona 02/11/2024 9:57 AM

## 2024-02-11 NOTE — Plan of Care (Signed)

## 2024-02-11 NOTE — Progress Notes (Signed)
 1 Day Post-Op Procedure(s) (LRB): CORONARY ARTERY BYPASS GRAFTING (CABG) TIMES THREE USING LEFT INTERNAL MAMMARY ARTERY AND ENDOSCOPICALLY HARVESTED RIGHT GREATER SAPHENOUS VEIN. (N/A) TRANSESOPHAGEAL ECHOCARDIOGRAM (TEE) (N/A) Subjective: Extubated around 2 AM C/o incisional pain and discomfort from IABP  Objective: Vital signs in last 24 hours: Temp:  [95.5 F (35.3 C)-99.7 F (37.6 C)] 99.1 F (37.3 C) (02/15 0900) Pulse Rate:  [82-282] 93 (02/15 0900) Cardiac Rhythm: Normal sinus rhythm (02/15 0800) Resp:  [14-34] 20 (02/15 0900) BP: (80-161)/(42-113) 90/71 (02/15 0900) SpO2:  [86 %-100 %] 96 % (02/15 0900) Arterial Line BP: (81-180)/(27-75) 102/44 (02/15 0900) FiO2 (%):  [40 %-60 %] 40 % (02/14 2313) Weight:  [68.9 kg] 68.9 kg (02/15 0546)  Hemodynamic parameters for last 24 hours: PAP: (20-247)/(11-38) 32/18 CVP:  [10 mmHg-28 mmHg] 17 mmHg CO:  [1.8 L/min-4.2 L/min] 3 L/min CI:  [1.13 L/min/m2-2.6 L/min/m2] 1.86 L/min/m2  Intake/Output from previous day: 02/14 0701 - 02/15 0700 In: 4764.3 [I.V.:2156.6; Blood:225; NG/GT:60; IV Piggyback:2322.7] Out: 3090 [Urine:2150; Blood:450; Chest Tube:490] Intake/Output this shift: Total I/O In: 32.2 [I.V.:32.2] Out: 90 [Urine:60; Chest Tube:30]  General appearance: alert, cooperative, and no distress Neurologic: intact Heart: regular rate and rhythm Lungs: diminished breath sounds bibasilar Extremities: well perfused  Lab Results: Recent Labs    02/10/24 1807 02/10/24 1814 02/11/24 0319 02/11/24 0457  WBC 21.0*  --   --  19.2*  HGB 10.6*   < > 10.5* 9.7*  HCT 33.7*   < > 31.0* 31.0*  PLT 118*  --   --  104*   < > = values in this interval not displayed.   BMET:  Recent Labs    02/10/24 1807 02/10/24 1814 02/11/24 0319 02/11/24 0457  NA 137   < > 139 136  K 6.1*   < > 4.5 4.5  CL 104  --   --  106  CO2 24  --   --  21*  GLUCOSE 164*  --   --  128*  BUN 7*  --   --  9  CREATININE 1.05*  --   --  1.16*   CALCIUM 8.2*  --   --  8.4*   < > = values in this interval not displayed.    PT/INR:  Recent Labs    02/10/24 1250  LABPROT 19.0*  INR 1.6*   ABG    Component Value Date/Time   PHART 7.382 02/11/2024 0319   HCO3 22.2 02/11/2024 0319   TCO2 23 02/11/2024 0319   ACIDBASEDEF 2.0 02/11/2024 0319   O2SAT 90 02/11/2024 0319   CBG (last 3)  Recent Labs    02/11/24 0401 02/11/24 0458 02/11/24 0605  GLUCAP 147* 135* 142*    Assessment/Plan: S/P Procedure(s) (LRB): CORONARY ARTERY BYPASS GRAFTING (CABG) TIMES THREE USING LEFT INTERNAL MAMMARY ARTERY AND ENDOSCOPICALLY HARVESTED RIGHT GREATER SAPHENOUS VEIN. (N/A) TRANSESOPHAGEAL ECHOCARDIOGRAM (TEE) (N/A) POD # 1 NEURO- intact CV- in SR  CI 1.86 on 1:3- dc IABP  ASA, statin, beta blocker, start Plavix prior to dc  Dc Swan and A line RESP_ on 10L HFNC- CXR looks wet- diurese  IS for atelectasis RENAL- creatinine 1.16, lytes OK  Diurese as above ENDO- CBG mildly elevated  Transition to SemGlee + SSI GI- advance diet as tolerated Anemia secondary to ABL- mild, monitor Thrombocytopenia- PLT 104K, monitor SCD.  Hold off on enoxaparin until tomorrow due to dc IABP and borderline PLT Mobilize once IABP out  LOS: 5 days    Salvatore Decent  Dorris Fetch 02/11/2024

## 2024-02-11 NOTE — Progress Notes (Signed)
 IABP aspirated and removed from right femoral artery. Manual pressure applied for 30 minutes. Site level 0, no s+s of hematoma. Tegaderm dressing applied, bedrest instructions given.  Bilateral dp and pt pulses present with doppler, left side dp weaker.   Bedrest begins at 10:37:00

## 2024-02-12 ENCOUNTER — Inpatient Hospital Stay (HOSPITAL_COMMUNITY): Payer: Medicare HMO

## 2024-02-12 ENCOUNTER — Other Ambulatory Visit: Payer: Self-pay

## 2024-02-12 DIAGNOSIS — I214 Non-ST elevation (NSTEMI) myocardial infarction: Secondary | ICD-10-CM | POA: Diagnosis not present

## 2024-02-12 LAB — CBC
HCT: 28.7 % — ABNORMAL LOW (ref 36.0–46.0)
Hemoglobin: 9.4 g/dL — ABNORMAL LOW (ref 12.0–15.0)
MCH: 26.6 pg (ref 26.0–34.0)
MCHC: 32.8 g/dL (ref 30.0–36.0)
MCV: 81.1 fL (ref 80.0–100.0)
Platelets: 124 10*3/uL — ABNORMAL LOW (ref 150–400)
RBC: 3.54 MIL/uL — ABNORMAL LOW (ref 3.87–5.11)
RDW: 14.1 % (ref 11.5–15.5)
WBC: 23.2 10*3/uL — ABNORMAL HIGH (ref 4.0–10.5)
nRBC: 0 % (ref 0.0–0.2)

## 2024-02-12 LAB — BASIC METABOLIC PANEL
Anion gap: 9 (ref 5–15)
BUN: 12 mg/dL (ref 8–23)
CO2: 23 mmol/L (ref 22–32)
Calcium: 8.7 mg/dL — ABNORMAL LOW (ref 8.9–10.3)
Chloride: 106 mmol/L (ref 98–111)
Creatinine, Ser: 1.71 mg/dL — ABNORMAL HIGH (ref 0.44–1.00)
GFR, Estimated: 31 mL/min — ABNORMAL LOW (ref >60)
Glucose, Bld: 125 mg/dL — ABNORMAL HIGH (ref 70–99)
Potassium: 4.3 mmol/L (ref 3.5–5.1)
Sodium: 138 mmol/L (ref 135–145)

## 2024-02-12 LAB — COOXEMETRY PANEL
Carboxyhemoglobin: 1.9 % — ABNORMAL HIGH (ref 0.5–1.5)
Methemoglobin: 1 % (ref 0.0–1.5)
O2 Saturation: 61.3 %
Total hemoglobin: 9.5 g/dL — ABNORMAL LOW (ref 12.0–16.0)

## 2024-02-12 LAB — GLUCOSE, CAPILLARY
Glucose-Capillary: 104 mg/dL — ABNORMAL HIGH (ref 70–99)
Glucose-Capillary: 114 mg/dL — ABNORMAL HIGH (ref 70–99)
Glucose-Capillary: 126 mg/dL — ABNORMAL HIGH (ref 70–99)
Glucose-Capillary: 128 mg/dL — ABNORMAL HIGH (ref 70–99)
Glucose-Capillary: 196 mg/dL — ABNORMAL HIGH (ref 70–99)
Glucose-Capillary: 97 mg/dL (ref 70–99)

## 2024-02-12 MED ORDER — FUROSEMIDE 10 MG/ML IJ SOLN
40.0000 mg | Freq: Once | INTRAMUSCULAR | Status: AC
  Administered 2024-02-12: 40 mg via INTRAVENOUS

## 2024-02-12 MED ORDER — FUROSEMIDE 10 MG/ML IJ SOLN
40.0000 mg | Freq: Once | INTRAMUSCULAR | Status: AC
  Administered 2024-02-12: 40 mg via INTRAVENOUS
  Filled 2024-02-12: qty 4

## 2024-02-12 MED ORDER — ENOXAPARIN SODIUM 30 MG/0.3ML IJ SOSY
30.0000 mg | PREFILLED_SYRINGE | INTRAMUSCULAR | Status: DC
  Administered 2024-02-12: 30 mg via SUBCUTANEOUS
  Filled 2024-02-12: qty 0.3

## 2024-02-12 MED ORDER — INSULIN ASPART 100 UNIT/ML IJ SOLN
0.0000 [IU] | Freq: Three times a day (TID) | INTRAMUSCULAR | Status: DC
  Administered 2024-02-12: 1 [IU] via SUBCUTANEOUS

## 2024-02-12 MED ORDER — GUAIFENESIN ER 600 MG PO TB12
600.0000 mg | ORAL_TABLET | Freq: Two times a day (BID) | ORAL | Status: AC
  Administered 2024-02-12 – 2024-02-16 (×8): 600 mg via ORAL
  Filled 2024-02-12 (×10): qty 1

## 2024-02-12 MED ORDER — SODIUM CHLORIDE 0.9% FLUSH: 10.0000 mL | INTRAVENOUS | Status: DC | PRN

## 2024-02-12 MED ORDER — INSULIN ASPART 100 UNIT/ML IJ SOLN: 0.0000 [IU] | Freq: Every day | INTRAMUSCULAR | Status: DC

## 2024-02-12 MED ORDER — SODIUM CHLORIDE 0.9% FLUSH
10.0000 mL | Freq: Two times a day (BID) | INTRAVENOUS | Status: DC
  Administered 2024-02-12: 40 mL
  Administered 2024-02-12 – 2024-02-15 (×7): 10 mL
  Administered 2024-02-16: 20 mL
  Administered 2024-02-16: 10 mL
  Administered 2024-02-17: 20 mL
  Administered 2024-02-17 – 2024-02-19 (×4): 10 mL
  Administered 2024-02-19: 20 mL
  Administered 2024-02-20 (×2): 10 mL

## 2024-02-12 MED ORDER — FUROSEMIDE 10 MG/ML IJ SOLN
80.0000 mg | Freq: Once | INTRAMUSCULAR | Status: AC
  Administered 2024-02-12: 80 mg via INTRAVENOUS
  Filled 2024-02-12: qty 8

## 2024-02-12 NOTE — Plan of Care (Signed)
 Ambulating today x 2. Continues to have oxygenation problems requiring HFNC. + small BM today. Eating small amounts of food.

## 2024-02-12 NOTE — Progress Notes (Signed)
 2 Days Post-Op Procedure(s) (LRB): CORONARY ARTERY BYPASS GRAFTING (CABG) TIMES THREE USING LEFT INTERNAL MAMMARY ARTERY AND ENDOSCOPICALLY HARVESTED RIGHT GREATER SAPHENOUS VEIN. (N/A) TRANSESOPHAGEAL ECHOCARDIOGRAM (TEE) (N/A) Subjective: Complains of incisional pain  Objective: Vital signs in last 24 hours: Temp:  [98 F (36.7 C)-99.3 F (37.4 C)] 98.3 F (36.8 C) (02/16 0744) Pulse Rate:  [78-109] 87 (02/16 0755) Cardiac Rhythm: Normal sinus rhythm;Sinus tachycardia (02/16 0400) Resp:  [15-29] 15 (02/16 0755) BP: (80-118)/(54-85) 118/66 (02/16 0700) SpO2:  [82 %-98 %] 93 % (02/16 0759) Arterial Line BP: (99-164)/(40-72) 121/45 (02/15 1700) Weight:  [67.3 kg] 67.3 kg (02/16 0500)  Hemodynamic parameters for last 24 hours: PAP: (32-53)/(17-35) 41/26 CVP:  [16 mmHg-41 mmHg] 41 mmHg CO:  [3 L/min] 3 L/min CI:  [1.86 L/min/m2] 1.86 L/min/m2  Intake/Output from previous day: 02/15 0701 - 02/16 0700 In: 1358.2 [P.O.:596; I.V.:212.2; IV Piggyback:550] Out: 1125 [Urine:945; Chest Tube:180] Intake/Output this shift: No intake/output data recorded.  General appearance: alert, cooperative, and no distress Neurologic: intact Heart: regular rate and rhythm Lungs: rhonchi bilaterally Abdomen: normal findings: soft, non-tender  Lab Results: Recent Labs    02/11/24 1547 02/12/24 0322  WBC 20.3* 23.2*  HGB 9.1* 9.4*  HCT 28.4* 28.7*  PLT 94* 124*   BMET:  Recent Labs    02/11/24 1547 02/12/24 0322  NA 136 138  K 4.3 4.3  CL 103 106  CO2 20* 23  GLUCOSE 137* 125*  BUN 11 12  CREATININE 1.32* 1.71*  CALCIUM 8.4* 8.7*    PT/INR:  Recent Labs    02/10/24 1250  LABPROT 19.0*  INR 1.6*   ABG    Component Value Date/Time   PHART 7.382 02/11/2024 0319   HCO3 22.2 02/11/2024 0319   TCO2 23 02/11/2024 0319   ACIDBASEDEF 2.0 02/11/2024 0319   O2SAT 61.3 02/12/2024 0330   CBG (last 3)  Recent Labs    02/11/24 2357 02/12/24 0333 02/12/24 0743  GLUCAP 196*  126* 104*    Assessment/Plan: S/P Procedure(s) (LRB): CORONARY ARTERY BYPASS GRAFTING (CABG) TIMES THREE USING LEFT INTERNAL MAMMARY ARTERY AND ENDOSCOPICALLY HARVESTED RIGHT GREATER SAPHENOUS VEIN. (N/A) TRANSESOPHAGEAL ECHOCARDIOGRAM (TEE) (N/A) POD # 2 CABG NEURO- intact CV- in SR, BP borderline,  co-ox Ok at 61  ASA, statin  Resume beta blocker when BP higher RESP- Bibasilar atelectasis, congestion  IS, add flutter valve and mucinex RENAL- creatinine up from 1.32 to 1.7 c/w AKI  Should resolve with time  IV Lasix this AM, monitor UO, labs ENDO- CBG elevated last night, better this AM  Continue SemGlee, change SSI to AC, HS Anemia secondary to ABL- mild, monitor Thrombocytopenia- improved SCD , add Lovenox Leukocytosis- WBC up to 23K  Likely reactive from CPB, but lungs are also a potential source Dc chest tubes  LOS: 6 days    Meghan Lynn 02/12/2024

## 2024-02-12 NOTE — Progress Notes (Signed)
      [address]       [place] [address]             [phone]      POD # 2  Sleeping currently  BP 118/82   Pulse 96   Temp 98 F (36.7 C) (Oral)   Resp 17   Ht 5\' 1"  (1.549 m)   Wt 67.3 kg   SpO2 93%   BMI 28.03 kg/m  CVP 11   Intake/Output Summary (Last 24 hours) at 02/12/2024 1836 Last data filed at 02/12/2024 1800 Gross per 24 hour  Intake 742 ml  Output 1850 ml  Net -1108 ml   Good response to Lasix today  Respiratory status remains problematic - continue IS, Flutter, nebs  Viviann Spare C. Dorris Fetch, MD [facility] [phone]

## 2024-02-12 NOTE — Progress Notes (Signed)
 Peripherally Inserted Central Catheter Placement  The IV Nurse has discussed with the patient and/or persons authorized to consent for the patient, the purpose of this procedure and the potential benefits and risks involved with this procedure.  The benefits include less needle sticks, lab draws from the catheter, and the patient may be discharged home with the catheter. Risks include, but not limited to, infection, bleeding, blood clot (thrombus formation), and puncture of an artery; nerve damage and irregular heartbeat and possibility to perform a PICC exchange if needed/ordered by physician.  Alternatives to this procedure were also discussed.  Bard Power PICC patient education guide, fact sheet on infection prevention and patient information card has been provided to patient /or left at bedside.    PICC Placement Documentation  PICC Double Lumen 02/12/24 Right Basilic 35 cm 0 cm (Active)  Indication for Insertion or Continuance of Line Chronic illness with exacerbations (CF, Sickle Cell, etc.) 02/12/24 1140  Exposed Catheter (cm) 0 cm 02/12/24 1140  Site Assessment Clean, Dry, Intact 02/12/24 1140  Lumen #1 Status Saline locked;Blood return noted 02/12/24 1140  Lumen #2 Status Saline locked;Blood return noted 02/12/24 1140  Dressing Type Transparent;Securing device 02/12/24 1140  Dressing Status Antimicrobial disc/dressing in place;Clean, Dry, Intact 02/12/24 1140  Line Care Connections checked and tightened 02/12/24 1140  Line Adjustment (NICU/IV Team Only) No 02/12/24 1140  Dressing Intervention New dressing;Adhesive placed at insertion site (IV team only) 02/12/24 1140  Dressing Change Due 02/19/24 02/12/24 1140       Burnard Bunting Chenice 02/12/2024, 11:41 AM

## 2024-02-12 NOTE — Progress Notes (Addendum)
 Patient ID: Meghan Lynn, female   DOB: [birth year], 73 y.o.   MRN: [id number]     Advanced Heart Failure Rounding Note  Cardiologist: Chrystie Nose, MD   Chief Complaint: NSTEMI  Subjective:    2/14: CABG x 3 with with LIMA-LAD, SVG-OM, SVG-PDA.  2/15: IABP removed.   CVP 17 this morning, I/Os even yesterday with creatinine 1.3 => 1.7.  BP stable, she is off pressors.  Co-ox 61%.   WBCs higher at 23, CXR with atelectasis.  Afebrile.    Objective:   Weight Range: 67.3 kg Body mass index is 28.03 kg/m.   Vital Signs:   Temp:  [98 F (36.7 C)-99.3 F (37.4 C)] 98.3 F (36.8 C) (02/16 0744) Pulse Rate:  [78-109] 87 (02/16 0930) Resp:  [15-29] 15 (02/16 0930) BP: (80-124)/(54-101) 123/68 (02/16 0922) SpO2:  [81 %-98 %] 88 % (02/16 0930) Arterial Line BP: (109-164)/(40-72) 121/45 (02/15 1700) Weight:  [67.3 kg] 67.3 kg (02/16 0500) Last BM Date : 02/10/24  Weight change: Filed Weights   02/11/24 0500 02/11/24 0546 02/12/24 0500  Weight: 68.9 kg 68.9 kg 67.3 kg    Intake/Output:   Intake/Output Summary (Last 24 hours) at 02/12/2024 0940 Last data filed at 02/12/2024 0700 Gross per 24 hour  Intake 1294.37 ml  Output 1035 ml  Net 259.37 ml      Physical Exam    General: Mild tachypnea.  Neck: JVP 16 cm, no thyromegaly or thyroid nodule.  Lungs: Decreased BS at bases.  CV: Nondisplaced PMI.  Heart regular S1/S2, no S3/S4, no murmur.  1+ ankle edema.  Abdomen: Soft, nontender, no hepatosplenomegaly, no distention.  Skin: Intact without lesions or rashes.  Neurologic: Alert and oriented x 3.  Psych: Normal affect. Extremities: No clubbing or cyanosis.  HEENT: Normal.   Telemetry   NSR 90s (personally reviewed)  Labs    CBC Recent Labs    02/11/24 1547 02/12/24 0322  WBC 20.3* 23.2*  HGB 9.1* 9.4*  HCT 28.4* 28.7*  MCV 80.9 81.1  PLT 94* 124*   Basic Metabolic Panel Recent Labs    69/62/95 0457 02/11/24 1547 02/12/24 0322  NA 136 136 138   K 4.5 4.3 4.3  CL 106 103 106  CO2 21* 20* 23  GLUCOSE 128* 137* 125*  BUN 9 11 12   CREATININE 1.16* 1.32* 1.71*  CALCIUM 8.4* 8.4* 8.7*  MG 2.8* 2.5*  --    Liver Function Tests No results for input(s): "AST", "ALT", "ALKPHOS", "BILITOT", "PROT", "ALBUMIN" in the last 72 hours.  No results for input(s): "LIPASE", "AMYLASE" in the last 72 hours.  Cardiac Enzymes No results for input(s): "CKTOTAL", "CKMB", "CKMBINDEX", "TROPONINI" in the last 72 hours.  BNP: BNP (last 3 results) No results for input(s): "BNP" in the last 8760 hours.  ProBNP (last 3 results) No results for input(s): "PROBNP" in the last 8760 hours.   D-Dimer No results for input(s): "DDIMER" in the last 72 hours. Hemoglobin A1C No results for input(s): "HGBA1C" in the last 72 hours.  Fasting Lipid Panel No results for input(s): "CHOL", "HDL", "LDLCALC", "TRIG", "CHOLHDL", "LDLDIRECT" in the last 72 hours. Thyroid Function Tests No results for input(s): "TSH", "T4TOTAL", "T3FREE", "THYROIDAB" in the last 72 hours.  Invalid input(s): "FREET3"  Other results:   Imaging    DG Chest Port 1 View Result Date: 02/12/2024 CLINICAL DATA:  CABG EXAM: PORTABLE CHEST 1 VIEW COMPARISON:  Yesterday FINDINGS: Swan-Ganz catheter has been removed. The right IJ sheath is  in stable position. Persisting chest drains with no visible pneumothorax or increasing pleural fluid. There is diffuse interstitial opacity at least partially from COPD. Indistinct density at the lung bases with pleural fluid and streaky density. Stable cardiac enlargement. IMPRESSION: 1. Remaining hardware in stable position. 2. Mild worsening of atelectasis at the bases. No visible pneumothorax. Electronically Signed   By: Tiburcio Pea M.D.   On: 02/12/2024 08:19     Medications:     Scheduled Medications:  acetaminophen  1,000 mg Oral Q6H   Or   acetaminophen (TYLENOL) oral liquid 160 mg/5 mL  1,000 mg Per Tube Q6H   aspirin EC  325 mg Oral  Daily   Or   aspirin  324 mg Per Tube Daily   bisacodyl  10 mg Oral Daily   Or   bisacodyl  10 mg Rectal Daily   Chlorhexidine Gluconate Cloth  6 each Topical Daily   docusate sodium  200 mg Oral Daily   enoxaparin (LOVENOX) injection  30 mg Subcutaneous Q24H   guaiFENesin  600 mg Oral BID   insulin aspart  0-5 Units Subcutaneous QHS   insulin aspart  0-9 Units Subcutaneous TID WC   insulin glargine-yfgn  15 Units Subcutaneous BID   pantoprazole  40 mg Oral Daily   sodium chloride flush  3 mL Intravenous Q12H   sodium chloride flush  3-10 mL Intravenous Q12H   umeclidinium-vilanterol  1 puff Inhalation Daily    Infusions:  albumin human Stopped (02/11/24 0518)    PRN Medications: albumin human, albuterol, ALPRAZolam, metoprolol tartrate, morphine injection, ondansetron (ZOFRAN) IV, oxyCODONE, sodium chloride flush, sodium chloride flush, traMADol    Patient Profile   73 y.o. female with history of CAD, COPD, tobacco use, HLD, dysphagia. Admitted with NSTEMI. Found to have multivessel CAD on cath. IABP placed d/t ongoing angina.   Assessment/Plan   1. CAD: NSTEMI this admission, peak HS-TnI 762.  No significant ECG changes.  Patient has history of DES x 3 to RCA in 2014.  Coronary angiography 02/12 shows totally occluded RCA with collaterals, 80% proximal and 70% mid LCx stenosis, 90% distal LCx, 99% mid/distal LAD stenosis. CAD was thought most amenable to CABG.  IABP was placed for refractory angina, subsequent resolution.  Patient now s/p CABG with LIMA-LAD, SVG-OM, SVG-PDA on 2/14.  - Continue ASA.  - Patient is unable to tolerate statins, she is currently on Zetia at home.  She will need a PCSK9 inhibitor after discharge.  2. HF with mid range EF: Ischemic cardiomyopathy. Pre-op echo showed EF 45% with severe HK to AK of the mid-apical anteroseptal wall, the apical wall segments, and the true apex, the basal segments were hyperkinetic and there was evidence for a related  outflow gradient. IABP placed pre-op, removed POD 1.  She is not currently on pressors or inotropes, MAP adequate and co-ox adequate at 61%.  She is significantly volume overloaded with CVP 17, tachypneic.  - Lasix 80 mg IV bid today, follow response.   - Her introducer in internal jugular is being removed today.  With volume overload, think I would continue to follow hemodynamics for management.  Will arrange for PICC.  3. Dysphagia: Pre-op barium swallow showed no stricture.  4. COPD: Active smoker.  Counseled cessation. Continue Anoro inhaler.  5. Abdominal pain: Pre-op.  No evidence for pancreatitis with normal lipase x 2.  GI has signed off.  6. ID: Blood cultures negative, afebrile.  She was initially on ceftriaxone/Flagyl, this appears  to have been started for ?abdominal source. This was stopped pre-op with no clear indication.  Now, WBCs 23 post-op and afebrile.  Suspect reactive leukocytosis but follow trend and fever curve.  CXR with likely atelectasis, needs to use IS.  7. AKI: Creatinine 1.3 => 1.7 post-op.  BP stable.  She is significantly volume overloaded.  - Lasix today as above, follow response.   Marca Ancona 02/12/2024 9:40 AM

## 2024-02-12 NOTE — Progress Notes (Signed)
 Patient OOB to chair and ambulated around the unit on 10L HFNC. Did get SOB after ambulating and started having expiratory wheezes and rhonchi requiring a Nebulizer to calm her back down with her breathing.  Addendum: Patient on BSC. Also got SOB when getting off from commode. Xanax given to help calm patient down with her breathing and oxycodone for a pain scale of 7 as well. Will continue to monitor.  CVP now 10 after diuresing from lasix this morning.

## 2024-02-13 ENCOUNTER — Ambulatory Visit (HOSPITAL_COMMUNITY): Payer: Medicare HMO

## 2024-02-13 ENCOUNTER — Inpatient Hospital Stay (HOSPITAL_COMMUNITY): Payer: Medicare HMO

## 2024-02-13 ENCOUNTER — Encounter (HOSPITAL_COMMUNITY): Payer: Self-pay | Admitting: Thoracic Surgery (Cardiothoracic Vascular Surgery)

## 2024-02-13 DIAGNOSIS — I214 Non-ST elevation (NSTEMI) myocardial infarction: Secondary | ICD-10-CM | POA: Diagnosis not present

## 2024-02-13 LAB — BASIC METABOLIC PANEL
Anion gap: 9 (ref 5–15)
Anion gap: 11 (ref 5–15)
BUN: 15 mg/dL (ref 8–23)
BUN: 19 mg/dL (ref 8–23)
CO2: 26 mmol/L (ref 22–32)
CO2: 27 mmol/L (ref 22–32)
Calcium: 8.5 mg/dL — ABNORMAL LOW (ref 8.9–10.3)
Calcium: 8.9 mg/dL (ref 8.9–10.3)
Chloride: 102 mmol/L (ref 98–111)
Chloride: 99 mmol/L (ref 98–111)
Creatinine, Ser: 1.19 mg/dL — ABNORMAL HIGH (ref 0.44–1.00)
Creatinine, Ser: 1.19 mg/dL — ABNORMAL HIGH (ref 0.44–1.00)
GFR, Estimated: 48 mL/min — ABNORMAL LOW (ref >60)
GFR, Estimated: 48 mL/min — ABNORMAL LOW (ref >60)
Glucose, Bld: 87 mg/dL (ref 70–99)
Glucose, Bld: 95 mg/dL (ref 70–99)
Potassium: 3.5 mmol/L (ref 3.5–5.1)
Potassium: 4.1 mmol/L (ref 3.5–5.1)
Sodium: 137 mmol/L (ref 135–145)
Sodium: 137 mmol/L (ref 135–145)

## 2024-02-13 LAB — CBC
HCT: 28.3 % — ABNORMAL LOW (ref 36.0–46.0)
Hemoglobin: 9.4 g/dL — ABNORMAL LOW (ref 12.0–15.0)
MCH: 26.3 pg (ref 26.0–34.0)
MCHC: 33.2 g/dL (ref 30.0–36.0)
MCV: 79.3 fL — ABNORMAL LOW (ref 80.0–100.0)
Platelets: 177 10*3/uL (ref 150–400)
RBC: 3.57 MIL/uL — ABNORMAL LOW (ref 3.87–5.11)
RDW: 14 % (ref 11.5–15.5)
WBC: 20.6 10*3/uL — ABNORMAL HIGH (ref 4.0–10.5)
nRBC: 0 % (ref 0.0–0.2)

## 2024-02-13 LAB — COOXEMETRY PANEL
Carboxyhemoglobin: 2.2 % — ABNORMAL HIGH (ref 0.5–1.5)
Methemoglobin: 0.7 % (ref 0.0–1.5)
O2 Saturation: 61.5 %
Total hemoglobin: 9.5 g/dL — ABNORMAL LOW (ref 12.0–16.0)

## 2024-02-13 LAB — GLUCOSE, CAPILLARY
Glucose-Capillary: 80 mg/dL (ref 70–99)
Glucose-Capillary: 71 mg/dL (ref 70–99)
Glucose-Capillary: 102 mg/dL — ABNORMAL HIGH (ref 70–99)
Glucose-Capillary: 96 mg/dL (ref 70–99)

## 2024-02-13 LAB — PROCALCITONIN: Procalcitonin: 0.45 ng/mL

## 2024-02-13 LAB — MAGNESIUM: Magnesium: 2.2 mg/dL (ref 1.7–2.4)

## 2024-02-13 MED ORDER — FUROSEMIDE 10 MG/ML IJ SOLN
80.0000 mg | Freq: Two times a day (BID) | INTRAMUSCULAR | Status: DC
Start: 2024-02-13 — End: 2024-02-14
  Administered 2024-02-13 – 2024-02-14 (×3): 80 mg via INTRAVENOUS
  Filled 2024-02-13 (×3): qty 8

## 2024-02-13 MED ORDER — POTASSIUM CHLORIDE 10 MEQ/50ML IV SOLN
10.0000 meq | INTRAVENOUS | Status: AC
  Administered 2024-02-13 (×3): 10 meq via INTRAVENOUS
  Filled 2024-02-13 (×3): qty 50

## 2024-02-13 MED ORDER — FUROSEMIDE 10 MG/ML IJ SOLN
40.0000 mg | Freq: Once | INTRAMUSCULAR | Status: AC
  Administered 2024-02-13: 40 mg via INTRAVENOUS
  Filled 2024-02-13: qty 4

## 2024-02-13 MED ORDER — ENOXAPARIN SODIUM 40 MG/0.4ML IJ SOSY
40.0000 mg | PREFILLED_SYRINGE | INTRAMUSCULAR | Status: DC
  Administered 2024-02-13 – 2024-02-21 (×9): 40 mg via SUBCUTANEOUS
  Filled 2024-02-13 (×9): qty 0.4

## 2024-02-13 MED ORDER — POTASSIUM CHLORIDE CRYS ER 20 MEQ PO TBCR
20.0000 meq | EXTENDED_RELEASE_TABLET | ORAL | Status: AC
  Administered 2024-02-13 (×2): 20 meq via ORAL
  Filled 2024-02-13 (×2): qty 1

## 2024-02-13 MED ORDER — CARVEDILOL 3.125 MG PO TABS
3.1250 mg | ORAL_TABLET | Freq: Two times a day (BID) | ORAL | Status: DC
  Administered 2024-02-13 – 2024-02-21 (×16): 3.125 mg via ORAL
  Filled 2024-02-13 (×17): qty 1

## 2024-02-13 MED FILL — Nitroglycerin IV Soln 200 MCG/ML in D5W: INTRAVENOUS | Qty: 250 | Status: AC

## 2024-02-13 MED FILL — Magnesium Sulfate Inj 50%: INTRAMUSCULAR | Qty: 10 | Status: AC

## 2024-02-13 MED FILL — Potassium Chloride Inj 2 mEq/ML: INTRAVENOUS | Qty: 40 | Status: AC

## 2024-02-13 MED FILL — Heparin Sodium (Porcine) Inj 1000 Unit/ML: Qty: 1000 | Status: AC

## 2024-02-13 NOTE — Plan of Care (Signed)
  Problem: Coping: Goal: Ability to adjust to condition or change in health will improve Outcome: Progressing   Problem: Metabolic: Goal: Ability to maintain appropriate glucose levels will improve Outcome: Progressing   Problem: Nutritional: Goal: Maintenance of adequate nutrition will improve Outcome: Progressing   Problem: Tissue Perfusion: Goal: Adequacy of tissue perfusion will improve Outcome: Progressing   Problem: Activity: Goal: Risk for activity intolerance will decrease Outcome: Progressing   Problem: Nutrition: Goal: Adequate nutrition will be maintained Outcome: Progressing   Problem: Coping: Goal: Level of anxiety will decrease Outcome: Progressing   Problem: Pain Managment: Goal: General experience of comfort will improve and/or be controlled Outcome: Progressing

## 2024-02-13 NOTE — Progress Notes (Addendum)
 Patient ID: Meghan Lynn, female   DOB: [birth year], 74 y.o.   MRN: [id number]     Advanced Heart Failure Rounding Note  Cardiologist: Chrystie Nose, MD   Chief Complaint: NSTEMI  Subjective:    2/14: CABG x 3 with with LIMA-LAD, SVG-OM, SVG-PDA.  2/15: IABP removed.  2/16: Diuresed with IV lasix.   SOB with exertion. Remains on 8 liters [place].   Objective:   Weight Range: 64.8 kg Body mass index is 26.99 kg/m.   Vital Signs:   Temp:  [98 F (36.7 C)-99 F (37.2 C)] 98 F (36.7 C) (02/17 0811) Pulse Rate:  [83-101] 90 (02/17 0900) Resp:  [12-41] 18 (02/17 0900) BP: (98-141)/(59-94) 105/64 (02/17 0900) SpO2:  [89 %-100 %] 96 % (02/17 0900) Weight:  [64.8 kg] 64.8 kg (02/17 0500) Last BM Date : 02/12/24  Weight change: Filed Weights   02/11/24 0546 02/12/24 0500 02/13/24 0500  Weight: 68.9 kg 67.3 kg 64.8 kg    Intake/Output:   Intake/Output Summary (Last 24 hours) at 02/13/2024 1026 Last data filed at 02/13/2024 0915 Gross per 24 hour  Intake 426 ml  Output 2565 ml  Net -2139 ml     CVP 12-13  Physical Exam    General:  Frail. Thin, Dyspneic with conversation Neck: supple. JVP 11-12 . Carotids 2+ bilat; no bruits. No lymphadenopathy or thryomegaly appreciated. Cor: PMI nondisplaced. Regular rate & rhythm. No rubs, gallops or murmurs. Lungs: Decreased in the bases on 8 liters.  Extremities: no cyanosis, clubbing, rash, edema Neuro: alert & oriented x3 Affect flat  Telemetry   SR personally checked.  Labs    CBC Recent Labs    02/12/24 0322 02/13/24 0331  WBC 23.2* 20.6*  HGB 9.4* 9.4*  HCT 28.7* 28.3*  MCV 81.1 79.3*  PLT 124* 177   Basic Metabolic Panel Recent Labs    24/40/10 0457 02/11/24 1547 02/12/24 0322 02/13/24 0331  NA 136 136 138 137  K 4.5 4.3 4.3 3.5  CL 106 103 106 102  CO2 21* 20* 23 26  GLUCOSE 128* 137* 125* 87  BUN 9 11 12 15   CREATININE 1.16* 1.32* 1.71* 1.19*  CALCIUM 8.4* 8.4* 8.7* 8.5*  MG 2.8* 2.5*  --   --     Liver Function Tests No results for input(s): "AST", "ALT", "ALKPHOS", "BILITOT", "PROT", "ALBUMIN" in the last 72 hours.  No results for input(s): "LIPASE", "AMYLASE" in the last 72 hours.  Cardiac Enzymes No results for input(s): "CKTOTAL", "CKMB", "CKMBINDEX", "TROPONINI" in the last 72 hours.  BNP: BNP (last 3 results) No results for input(s): "BNP" in the last 8760 hours.  ProBNP (last 3 results) No results for input(s): "PROBNP" in the last 8760 hours.   D-Dimer No results for input(s): "DDIMER" in the last 72 hours. Hemoglobin A1C No results for input(s): "HGBA1C" in the last 72 hours.  Fasting Lipid Panel No results for input(s): "CHOL", "HDL", "LDLCALC", "TRIG", "CHOLHDL", "LDLDIRECT" in the last 72 hours. Thyroid Function Tests No results for input(s): "TSH", "T4TOTAL", "T3FREE", "THYROIDAB" in the last 72 hours.  Invalid input(s): "FREET3"  Other results:   Imaging    DG Chest Port 1 View Result Date: 02/13/2024 CLINICAL DATA:  Follow-up recent CABG. EXAM: PORTABLE CHEST 1 VIEW COMPARISON:  Earlier today FINDINGS: Stable enlarged cardiac silhouette and post CABG changes. Stable patchy in dense opacity at the left lung base increased patchy an interval dense opacity at the right lung base small to moderate-sized right pleural  effusion, increased right PICC tip approximately 3 cm proximal to the superior cavoatrial junction. Mild left glenohumeral degenerative changes. IMPRESSION: 1. Increased right basilar atelectasis or pneumonia and small to moderate-sized right pleural effusion. 2. Stable left basilar atelectasis or pneumonia. 3. Stable cardiomegaly. Electronically Signed   By: Beckie Salts M.D.   On: 02/13/2024 09:49   DG Chest Port 1 View Result Date: 02/13/2024 CLINICAL DATA:  Status post CABG. EXAM: PORTABLE CHEST 1 VIEW COMPARISON:  02/12/2024 FINDINGS: Stable enlarged cardiac silhouette and post CABG changes. No significant change in dense and patchy  density at the left lung base. Mildly increased patchy density at the right lung base with improved linear density at the right lung base minimal right pleural effusion. Right PICC tip in the superior vena cava approximately 2.5 cm above the superior cavoatrial junction. Mild bilateral glenohumeral degenerative changes. IMPRESSION: 1. No significant change in dense atelectasis and/or pneumonia at the left lung base. 2. Mildly increased patchy atelectasis or pneumonia at the right lung base with resolved linear atelectasis at the right lung base. 3. Minimal right pleural effusion. Electronically Signed   By: Beckie Salts M.D.   On: 02/13/2024 09:46     Medications:     Scheduled Medications:  acetaminophen  1,000 mg Oral Q6H   Or   acetaminophen (TYLENOL) oral liquid 160 mg/5 mL  1,000 mg Per Tube Q6H   aspirin EC  325 mg Oral Daily   Or   aspirin  324 mg Per Tube Daily   bisacodyl  10 mg Oral Daily   Or   bisacodyl  10 mg Rectal Daily   carvedilol  3.125 mg Oral BID WC   Chlorhexidine Gluconate Cloth  6 each Topical Daily   docusate sodium  200 mg Oral Daily   enoxaparin (LOVENOX) injection  40 mg Subcutaneous Q24H   guaiFENesin  600 mg Oral BID   insulin aspart  0-5 Units Subcutaneous QHS   insulin aspart  0-9 Units Subcutaneous TID WC   pantoprazole  40 mg Oral Daily   potassium chloride  20 mEq Oral Q4H   sodium chloride flush  10-40 mL Intracatheter Q12H   sodium chloride flush  3 mL Intravenous Q12H   sodium chloride flush  3-10 mL Intravenous Q12H   umeclidinium-vilanterol  1 puff Inhalation Daily    Infusions:  albumin human Stopped (02/11/24 0518)   potassium chloride 10 mEq (02/13/24 1022)    PRN Medications: albumin human, albuterol, ALPRAZolam, metoprolol tartrate, morphine injection, ondansetron (ZOFRAN) IV, oxyCODONE, sodium chloride flush, sodium chloride flush, sodium chloride flush, traMADol    Patient Profile   73 y.o. female with history of CAD, COPD,  tobacco use, HLD, dysphagia. Admitted with NSTEMI. Found to have multivessel CAD on cath. IABP placed d/t ongoing angina.   Assessment/Plan   1. CAD: NSTEMI this admission, peak HS-TnI 762.  No significant ECG changes.  Patient has history of DES x 3 to RCA in 2014.  Coronary angiography 02/12 shows totally occluded RCA with collaterals, 80% proximal and 70% mid LCx stenosis, 90% distal LCx, 99% mid/distal LAD stenosis. CAD was thought most amenable to CABG.  IABP was placed for refractory angina, subsequent resolution.  Patient now s/p CABG with LIMA-LAD, SVG-OM, SVG-PDA on 2/14. IABP removed 2/14 - Continue ASA.  - Patient is unable to tolerate statins, she is currently on Zetia at home.  She will need a PCSK9 inhibitor after discharge. Remains weak.  2. HF with mid range EF: Ischemic  cardiomyopathy. Pre-op echo showed EF 45% with severe HK to AK of the mid-apical anteroseptal wall, the apical wall segments, and the true apex, the basal segments were hyperkinetic and there was evidence for a related outflow gradient. IABP placed pre-op, removed POD 1.  She is not currently on pressors or inotropes, MAP adequate and co-ox stable 61.5%.  CVP 12-13. Needs additional diuresis. Start Lasix 80 mg IV bid today. 3. Dysphagia: Pre-op barium swallow showed no stricture.  4. COPD: Active smoker.  Counseled cessation. Continue Anoro inhaler.  5. Abdominal pain: Pre-op.  No evidence for pancreatitis with normal lipase x 2.  GI has signed off.  6. ID: Blood cultures negative, afebrile.  She was initially on ceftriaxone/Flagyl, this appears to have been started for ?abdominal source. This was stopped pre-op with no clear indication.   WBC 21K.    CXR - ? Atelectasis versus PNA.  Check pro calcitonin.  7. AKI: Creatinine peaked 1.7--> down to 1.2 today     Amy Clegg NP-C  02/13/2024 10:26 AM  Patient seen and examined with the above-signed Advanced Practice Provider and/or Housestaff. I personally reviewed  laboratory data, imaging studies and relevant notes. I independently examined the patient and formulated the important aspects of the plan. I have edited the note to reflect any of my changes or salient points. I have personally discussed the plan with the patient and/or family.  She is s/p CABG on 2/14. Sitting up in chair. Chest sore. Coughing up mild amount of mucous. Mild SOB. Co-ox 62% CVP 12-13  CXR with R base atx/effusion. WBC stable ~20k. No fevers or chills.   General:  Elderly frail woman sitting in chair  HEENT: normal Neck: supple.JVP to jaw . Carotids 2+ bilat; no bruits. No lymphadenopathy or thryomegaly appreciated. UEA:[id number] wound ok  Regular rate & rhythm.  Lungs: Dull at R base  Abdomen: soft, nontender, nondistended. No hepatosplenomegaly. No bruits or masses. Good bowel sounds. Extremities: no cyanosis, clubbing, rash, edema Neuro: alert & orientedx3, cranial nerves grossly intact. moves all 4 extremities w/o difficulty. Affect pleasant  She is progressing slowly post CABG. Co-ox ok but remains fluid overloaded. Agree with IV diuresis.   CXR with R basilar atx/effusion. Cannot exclude infiltrate. (CXR reviewed personally with CCM). Will continue diuresis and encouraged IS (can only do 250 currently). Check PCT. Low threshold to cover with abx. Continue to mobilize.   Renal function improved.   Arvilla Meres, MD  2:01 PM

## 2024-02-13 NOTE — Progress Notes (Signed)
 Patient ID: Meghan Lynn, female   DOB: 13-Nov-1951, 73 y.o.   MRN: [id number]  TCTS Evening Rounds:  Hemodynamically stable in sinus rhythm.   Sats 93%  on [place]  Diuresing well. -990 this shift.

## 2024-02-13 NOTE — Progress Notes (Signed)
 3 Days Post-Op Procedure(s) (LRB): CORONARY ARTERY BYPASS GRAFTING (CABG) TIMES THREE USING LEFT INTERNAL MAMMARY ARTERY AND ENDOSCOPICALLY HARVESTED RIGHT GREATER SAPHENOUS VEIN. (N/A) TRANSESOPHAGEAL ECHOCARDIOGRAM (TEE) (N/A) Subjective: Some pain, tired after walking and being up in chair  Objective: Vital signs in last 24 hours: Temp:  [98 F (36.7 C)-99 F (37.2 C)] 98.2 F (36.8 C) (02/17 0500) Pulse Rate:  [83-101] 99 (02/17 0604) Cardiac Rhythm: Normal sinus rhythm (02/17 0400) Resp:  [12-41] 18 (02/17 0604) BP: (98-153)/(59-101) 102/59 (02/17 0604) SpO2:  [81 %-100 %] 94 % (02/17 0610) Weight:  [64.8 kg] 64.8 kg (02/17 0500)  Hemodynamic parameters for last 24 hours: CVP:  [8 mmHg-17 mmHg] 17 mmHg  Intake/Output from previous day: 02/16 0701 - 02/17 0700 In: 536 [P.O.:536] Out: 2865 [Urine:2865] Intake/Output this shift: No intake/output data recorded.  General appearance: alert, cooperative, and mild distress Neurologic: intact Heart: regular rate and rhythm Lungs: diminished breath sounds bibasilar and wheezes bilaterally Wound: clean and dry  Lab Results: Recent Labs    02/12/24 0322 02/13/24 0331  WBC 23.2* 20.6*  HGB 9.4* 9.4*  HCT 28.7* 28.3*  PLT 124* 177   BMET:  Recent Labs    02/12/24 0322 02/13/24 0331  NA 138 137  K 4.3 3.5  CL 106 102  CO2 23 26  GLUCOSE 125* 87  BUN 12 15  CREATININE 1.71* 1.19*  CALCIUM 8.7* 8.5*    PT/INR:  Recent Labs    02/10/24 1250  LABPROT 19.0*  INR 1.6*   ABG    Component Value Date/Time   PHART 7.382 02/11/2024 0319   HCO3 22.2 02/11/2024 0319   TCO2 23 02/11/2024 0319   ACIDBASEDEF 2.0 02/11/2024 0319   O2SAT 61.5 02/13/2024 0331   CBG (last 3)  Recent Labs    02/12/24 1554 02/12/24 2126 02/13/24 0621  GLUCAP 128* 97 80    Assessment/Plan: S/P Procedure(s) (LRB): CORONARY ARTERY BYPASS GRAFTING (CABG) TIMES THREE USING LEFT INTERNAL MAMMARY ARTERY AND ENDOSCOPICALLY HARVESTED  RIGHT GREATER SAPHENOUS VEIN. (N/A) TRANSESOPHAGEAL ECHOCARDIOGRAM (TEE) (N/A) POD # 3 NEURO- intact CV- in Sr with co-ox 61  ASA, statin  HR in upper 90s and BP better- restart Coreg at 3.125 mg BID RESP- still on relatively high O2 at 8L [place]  Continue diuresis, nebs, IS, flutter, mucinex  Keep in ICU due to respiratory status RENAL- creatinine down form 1.7 to 1.2  Diuresing well  Supplement K ENDO- CBG well controlled  Dc Sem Glee, continue SSI GI- tolerating diet Leukocytosis- improved this AM- monitor SCD + enoxaparin Cardiac rehab   LOS: 7 days    [name] 02/13/2024

## 2024-02-13 NOTE — Discharge Instructions (Signed)

## 2024-02-14 ENCOUNTER — Inpatient Hospital Stay (HOSPITAL_COMMUNITY): Payer: Medicare HMO

## 2024-02-14 ENCOUNTER — Telehealth (HOSPITAL_COMMUNITY): Payer: Self-pay | Admitting: Pharmacy Technician

## 2024-02-14 ENCOUNTER — Other Ambulatory Visit (HOSPITAL_COMMUNITY): Payer: Self-pay

## 2024-02-14 DIAGNOSIS — I214 Non-ST elevation (NSTEMI) myocardial infarction: Secondary | ICD-10-CM | POA: Diagnosis not present

## 2024-02-14 LAB — COOXEMETRY PANEL
Carboxyhemoglobin: 2.2 % — ABNORMAL HIGH (ref 0.5–1.5)
Methemoglobin: <0.7 % (ref 0.0–1.5)
O2 Saturation: 67.6 %
Total hemoglobin: 10.2 g/dL — ABNORMAL LOW (ref 12.0–16.0)

## 2024-02-14 LAB — BASIC METABOLIC PANEL
Anion gap: 9 (ref 5–15)
BUN: 18 mg/dL (ref 8–23)
CO2: 25 mmol/L (ref 22–32)
Calcium: 8.7 mg/dL — ABNORMAL LOW (ref 8.9–10.3)
Chloride: 102 mmol/L (ref 98–111)
Creatinine, Ser: 1.36 mg/dL — ABNORMAL HIGH (ref 0.44–1.00)
GFR, Estimated: 41 mL/min — ABNORMAL LOW (ref >60)
Glucose, Bld: 93 mg/dL (ref 70–99)
Potassium: 3.9 mmol/L (ref 3.5–5.1)
Sodium: 136 mmol/L (ref 135–145)

## 2024-02-14 LAB — CBC
HCT: 30.4 % — ABNORMAL LOW (ref 36.0–46.0)
Hemoglobin: 9.8 g/dL — ABNORMAL LOW (ref 12.0–15.0)
MCH: 25.7 pg — ABNORMAL LOW (ref 26.0–34.0)
MCHC: 32.2 g/dL (ref 30.0–36.0)
MCV: 79.6 fL — ABNORMAL LOW (ref 80.0–100.0)
Platelets: 260 10*3/uL (ref 150–400)
RBC: 3.82 MIL/uL — ABNORMAL LOW (ref 3.87–5.11)
RDW: 13.8 % (ref 11.5–15.5)
WBC: 15.7 10*3/uL — ABNORMAL HIGH (ref 4.0–10.5)
nRBC: 0 % (ref 0.0–0.2)

## 2024-02-14 LAB — ECHO INTRAOPERATIVE TEE
Height: 61 in
Weight: 2204.8 [oz_av]

## 2024-02-14 LAB — MAGNESIUM: Magnesium: 2 mg/dL (ref 1.7–2.4)

## 2024-02-14 LAB — GLUCOSE, CAPILLARY
Glucose-Capillary: 84 mg/dL (ref 70–99)
Glucose-Capillary: 107 mg/dL — ABNORMAL HIGH (ref 70–99)

## 2024-02-14 MED ORDER — ASPIRIN 81 MG PO TBEC
81.0000 mg | DELAYED_RELEASE_TABLET | Freq: Every day | ORAL | Status: DC
  Administered 2024-02-14 – 2024-02-21 (×8): 81 mg via ORAL
  Filled 2024-02-14 (×8): qty 1

## 2024-02-14 MED ORDER — CLOPIDOGREL BISULFATE 75 MG PO TABS
75.0000 mg | ORAL_TABLET | Freq: Every day | ORAL | Status: DC
  Administered 2024-02-14 – 2024-02-21 (×8): 75 mg via ORAL
  Filled 2024-02-14 (×8): qty 1

## 2024-02-14 MED ORDER — POTASSIUM CHLORIDE CRYS ER 20 MEQ PO TBCR
20.0000 meq | EXTENDED_RELEASE_TABLET | Freq: Once | ORAL | Status: AC
  Administered 2024-02-14: 20 meq via ORAL
  Filled 2024-02-14: qty 1

## 2024-02-14 MED ORDER — FUROSEMIDE 40 MG PO TABS
40.0000 mg | ORAL_TABLET | Freq: Every day | ORAL | Status: DC
  Filled 2024-02-14: qty 1

## 2024-02-14 MED ORDER — SPIRONOLACTONE 12.5 MG HALF TABLET
12.5000 mg | ORAL_TABLET | Freq: Every day | ORAL | Status: DC
  Administered 2024-02-14 – 2024-02-21 (×8): 12.5 mg via ORAL
  Filled 2024-02-14 (×8): qty 1

## 2024-02-14 MED ORDER — EZETIMIBE 10 MG PO TABS
10.0000 mg | ORAL_TABLET | Freq: Every day | ORAL | Status: DC
  Administered 2024-02-14 – 2024-02-21 (×8): 10 mg via ORAL
  Filled 2024-02-14 (×8): qty 1

## 2024-02-14 NOTE — Progress Notes (Addendum)
 Patient ID: Meghan Lynn, female   DOB: [birth year], 73 y.o.   MRN: [id number]     Advanced Heart Failure Rounding Note  Cardiologist: Chrystie Nose, MD   Chief Complaint: NSTEMI  Subjective:    2/14: CABG x 3 with with LIMA-LAD, SVG-OM, SVG-PDA.  2/15: IABP removed.  2/16: Diuresed with IV lasix.  2/17: Diuresed with IV lasix. Brisk diuresis noted.   Denies SOB.    Objective:   Weight Range: 63.3 kg Body mass index is 26.38 kg/m.   Vital Signs:   Temp:  [98.2 F (36.8 C)-98.4 F (36.9 C)] 98.2 F (36.8 C) (02/18 0645) Pulse Rate:  [75-95] 94 (02/18 0800) Resp:  [12-29] 24 (02/18 0800) BP: (90-123)/(61-83) 103/65 (02/18 0800) SpO2:  [92 %-99 %] 95 % (02/18 0800) Weight:  [63.3 kg] 63.3 kg (02/18 0611) Last BM Date : 02/12/24  Weight change: Filed Weights   02/12/24 0500 02/13/24 0500 02/14/24 0611  Weight: 67.3 kg 64.8 kg 63.3 kg    Intake/Output:   Intake/Output Summary (Last 24 hours) at 02/14/2024 0823 Last data filed at 02/14/2024 0800 Gross per 24 hour  Intake 289.97 ml  Output 2400 ml  Net -2110.03 ml     CVP 8-9  Physical Exam   General: Sitting in the chair. No resp difficulty.Thin Cor: PMI nondisplaced. Regular rate & rhythm. No rubs, gallops or murmurs.  Lungs: clear on 4 liters [place] Abdomen: soft, nontender, nondistended. No hepatosplenomegaly. No bruits or masses. Good bowel sounds. Extremities: no cyanosis, clubbing, rash, edema Neuro: alert & orientedx3, MAE x 4. Telemetry  SR  70-90s Labs    CBC Recent Labs    02/13/24 0331 02/14/24 0505  WBC 20.6* 15.7*  HGB 9.4* 9.8*  HCT 28.3* 30.4*  MCV 79.3* 79.6*  PLT 177 260   Basic Metabolic Panel Recent Labs    29/52/84 2033 02/14/24 0505  NA 137 136  K 4.1 3.9  CL 99 102  CO2 27 25  GLUCOSE 95 93  BUN 19 18  CREATININE 1.19* 1.36*  CALCIUM 8.9 8.7*  MG 2.2 2.0   Liver Function Tests No results for input(s): "AST", "ALT", "ALKPHOS", "BILITOT", "PROT", "ALBUMIN" in the  last 72 hours.  No results for input(s): "LIPASE", "AMYLASE" in the last 72 hours.  Cardiac Enzymes No results for input(s): "CKTOTAL", "CKMB", "CKMBINDEX", "TROPONINI" in the last 72 hours.  BNP: BNP (last 3 results) No results for input(s): "BNP" in the last 8760 hours.  ProBNP (last 3 results) No results for input(s): "PROBNP" in the last 8760 hours.   D-Dimer No results for input(s): "DDIMER" in the last 72 hours. Hemoglobin A1C No results for input(s): "HGBA1C" in the last 72 hours.  Fasting Lipid Panel No results for input(s): "CHOL", "HDL", "LDLCALC", "TRIG", "CHOLHDL", "LDLDIRECT" in the last 72 hours. Thyroid Function Tests No results for input(s): "TSH", "T4TOTAL", "T3FREE", "THYROIDAB" in the last 72 hours.  Invalid input(s): "FREET3"  Other results:   Imaging    No results found.    Medications:     Scheduled Medications:  acetaminophen  1,000 mg Oral Q6H   aspirin EC  81 mg Oral Daily   bisacodyl  10 mg Oral Daily   Or   bisacodyl  10 mg Rectal Daily   carvedilol  3.125 mg Oral BID WC   Chlorhexidine Gluconate Cloth  6 each Topical Daily   clopidogrel  75 mg Oral Daily   docusate sodium  200 mg Oral Daily   enoxaparin (  LOVENOX) injection  40 mg Subcutaneous Q24H   ezetimibe  10 mg Oral Daily   furosemide  40 mg Oral Daily   guaiFENesin  600 mg Oral BID   pantoprazole  40 mg Oral Daily   sodium chloride flush  10-40 mL Intracatheter Q12H   umeclidinium-vilanterol  1 puff Inhalation Daily    Infusions:  albumin human Stopped (02/11/24 0518)    PRN Medications: albumin human, albuterol, ALPRAZolam, metoprolol tartrate, ondansetron (ZOFRAN) IV, oxyCODONE, sodium chloride flush, traMADol    Patient Profile   73 y.o. female with history of CAD, COPD, tobacco use, HLD, dysphagia. Admitted with NSTEMI. Found to have multivessel CAD on cath. IABP placed d/t ongoing angina.   Assessment/Plan   1. CAD: NSTEMI this admission, peak HS-TnI 762.   No significant ECG changes.  Patient has history of DES x 3 to RCA in 2014.  Coronary angiography 02/12 shows totally occluded RCA with collaterals, 80% proximal and 70% mid LCx stenosis, 90% distal LCx, 99% mid/distal LAD stenosis. CAD was thought most amenable to CABG.  IABP was placed for refractory angina, subsequent resolution.  Patient now s/p CABG with LIMA-LAD, SVG-OM, SVG-PDA on 2/14. IABP removed 2/14.  - Continue ASA.  - Patient is unable to tolerate statins, she is currently on Zetia at home.  She will need a PCSK9 inhibitor after discharge.  -CXR improving today. Continue to push pulmonary toilet.  2. HF with mid range EF: Ischemic cardiomyopathy. Pre-op echo showed EF 45% with severe HK to AK of the mid-apical anteroseptal wall, the apical wall segments, and the true apex, the basal segments were hyperkinetic and there was evidence for a related outflow gradient. IABP placed pre-op, removed POD 1.   She is not currently on pressors or inotropes, MAP adequate and co-ox remains stable 68%.  CVP 8-9. Give one more dose of IV lasix. Switching to po lasix.  -Continue coreg 3.125 mg twice a day.  -Add Spironolactone 12.5 mg daily.  - Should be able to add SGLT2i once foley removed.  3. Dysphagia: Pre-op barium swallow showed no stricture.  4. COPD: Active smoker.  Counseled cessation. Continue Anoro inhaler.  5. Abdominal pain: Pre-op.  No evidence for pancreatitis with normal lipase x 2.  GI has signed off.  6. ID: Blood cultures negative, afebrile.  She was initially on ceftriaxone/Flagyl, this appears to have been started for ?abdominal source. This was stopped pre-op with no clear indication.   WBC  Trending down. CXR 2/17 Atelectasis versus PNA. Procalcitonin 0.45. CXR looks better today.  7. AKI: Creatinine peaked 1.7--> down to 1.2 today  Continue to mobilize and push pulmonary toilet.      Amy Clegg NP-C  02/14/2024 8:23 AM  Patient seen and examined with the above-signed  Advanced Practice Provider and/or Housestaff. I personally reviewed laboratory data, imaging studies and relevant notes. I independently examined the patient and formulated the important aspects of the plan. I have edited the note to reflect any of my changes or salient points. I have personally discussed the plan with the patient and/or family.   Sitting up in chair.   Has diuresed well with IV lasix. Breathing better. CVP 8-9 (checked personally). Co-ox 68% . Cough improving. WBC down to 15K. No fevers or chills.  PCT 0.45. Rhythm stable. + BM  General:  Elderly thin. Sitting in chair No resp difficulty HEENT: normal Neck: supple.JVP 8-9. Carotids 2+ bilat; no bruits. No lymphadenopathy or thryomegaly appreciated. Cor: Sternal wound ok  Regular rate & rhythm. No rubs, gallops or murmurs. Lungs: dull at bases  Abdomen: soft, nontender, nondistended. No hepatosplenomegaly. No bruits or masses. Good bowel sounds. Extremities: no cyanosis, clubbing, rash, edema Neuro: alert & orientedx3, cranial nerves grossly intact. moves all 4 extremities w/o difficulty. Affect pleasant  Volume status improving but still overloaded. Continue IV diuresis one more day.   Suspect WBC due to atx. Push pulmonary toilet. We did IS together today. She is up to 600cc. Will ambulate  Titrate GDMT.   Bowel regimen d/w PharmD.   Arvilla Meres, MD  10:50 AM

## 2024-02-14 NOTE — Progress Notes (Signed)
 4 Days Post-Op Procedure(s) (LRB): CORONARY ARTERY BYPASS GRAFTING (CABG) TIMES THREE USING LEFT INTERNAL MAMMARY ARTERY AND ENDOSCOPICALLY HARVESTED RIGHT GREATER SAPHENOUS VEIN. (N/A) TRANSESOPHAGEAL ECHOCARDIOGRAM (TEE) (N/A) Subjective: Some incisional pain  Objective: Vital signs in last 24 hours: Temp:  [98 F (36.7 C)-98.4 F (36.9 C)] 98.2 F (36.8 C) (02/18 0645) Pulse Rate:  [75-93] 87 (02/18 0500) Cardiac Rhythm: Normal sinus rhythm (02/18 0400) Resp:  [12-29] 20 (02/18 0611) BP: (90-123)/(61-83) 112/81 (02/18 0611) SpO2:  [92 %-99 %] 95 % (02/18 0500) Weight:  [63.3 kg] 63.3 kg (02/18 0611)  Hemodynamic parameters for last 24 hours: CVP:  [4 mmHg-15 mmHg] 9 mmHg  Intake/Output from previous day: 02/17 0701 - 02/18 0700 In: 280 [P.O.:120; I.V.:10; IV Piggyback:150] Out: 2400 [Urine:2400] Intake/Output this shift: No intake/output data recorded.  General appearance: alert, cooperative, and no distress Neurologic: intact Heart: regular rate and rhythm Lungs: diminished breath sounds bibasilar Abdomen: normal findings: soft, non-tender Wound: clean and dry  Lab Results: Recent Labs    02/13/24 0331 02/14/24 0505  WBC 20.6* 15.7*  HGB 9.4* 9.8*  HCT 28.3* 30.4*  PLT 177 260   BMET:  Recent Labs    02/13/24 2033 02/14/24 0505  NA 137 136  K 4.1 3.9  CL 99 102  CO2 27 25  GLUCOSE 95 93  BUN 19 18  CREATININE 1.19* 1.36*  CALCIUM 8.9 8.7*    PT/INR: No results for input(s): "LABPROT", "INR" in the last 72 hours. ABG    Component Value Date/Time   PHART 7.382 02/11/2024 0319   HCO3 22.2 02/11/2024 0319   TCO2 23 02/11/2024 0319   ACIDBASEDEF 2.0 02/11/2024 0319   O2SAT 67.6 02/14/2024 0505   CBG (last 3)  Recent Labs    02/13/24 1604 02/13/24 2120 02/14/24 0657  GLUCAP 102* 96 84    Assessment/Plan: S/P Procedure(s) (LRB): CORONARY ARTERY BYPASS GRAFTING (CABG) TIMES THREE USING LEFT INTERNAL MAMMARY ARTERY AND ENDOSCOPICALLY  HARVESTED RIGHT GREATER SAPHENOUS VEIN. (N/A) TRANSESOPHAGEAL ECHOCARDIOGRAM (TEE) (N/A) - POD # 4 NEURO- intact CV- in SR, BP ok, co-ox 68  ASA, Coreg, Zetia (Statin intolerance) Add Plavix RESP- Still primary issue  Down to 4L [place] this AM  CXR yesterday showed Right pleural effusion, BB Atx- recheck today  Continue IS, flutter, bronchodilators ID- afebrile, WBC trending down- now 15K RENAL- creatinine up slightly to 1.36 after aggressive diuresis- 2L negative  [place] OK  Received 80 mg of Lasix again this AM ENDO_ CBG normal- dc [facility] GI- tolerating diet SCD + enoxaparin Cardiac rehab   LOS: 8 days    [name] 02/14/2024

## 2024-02-14 NOTE — Anesthesia Postprocedure Evaluation (Signed)
 Anesthesia Post Note  Patient: Meghan Lynn  Procedure(s) Performed: CORONARY ARTERY BYPASS GRAFTING (CABG) TIMES THREE USING LEFT INTERNAL MAMMARY ARTERY AND ENDOSCOPICALLY HARVESTED RIGHT GREATER SAPHENOUS VEIN. (Chest) TRANSESOPHAGEAL ECHOCARDIOGRAM (TEE)     Patient location during evaluation: SICU Anesthesia Type: General Level of consciousness: sedated Pain management: pain level controlled Vital Signs Assessment: post-procedure vital signs reviewed and stable Respiratory status: patient remains intubated per anesthesia plan Cardiovascular status: stable Postop Assessment: no apparent nausea or vomiting Anesthetic complications: no   No notable events documented.  Last Vitals:  Vitals:   02/14/24 0743 02/14/24 0800  BP: 123/81 103/65  Pulse: 95 94  Resp: (!) 22 (!) 24  Temp:    SpO2: 95% 95%    Last Pain:  Vitals:   02/14/24 0800  TempSrc:   PainSc: 5     LLE Motor Response: Purposeful movement;Responds to commands (02/14/24 0800)   RLE Motor Response: Purposeful movement;Responds to commands (02/14/24 0800)        Maizie Garno S

## 2024-02-14 NOTE — Telephone Encounter (Signed)
 Patient Product/process development scientist completed.    The patient is insured through [employer]. Patient has Medicare and is not eligible for a copay card, but may be able to apply for patient assistance or Medicare RX Payment Plan (Patient Must reach out to their plan, if eligible for payment plan), if available.    Ran test claim for Farxiga 10 mg and the current 30 day co-pay is $151.05 due to a deductible.  Ran test claim for Jardiance 10 mg and the current 30 day co-pay is $158.54 due to a deductible.   This test claim was processed through [facility]- copay amounts may vary at other pharmacies due to pharmacy/plan contracts, or as the patient moves through the different stages of their insurance plan.     Roland Earl, CPHT Pharmacy Technician III Certified Patient Advocate [facility] Patient Advocate Team Direct Number: [phone]  Fax: [phone]

## 2024-02-15 ENCOUNTER — Inpatient Hospital Stay (HOSPITAL_COMMUNITY): Payer: Medicare HMO

## 2024-02-15 DIAGNOSIS — I214 Non-ST elevation (NSTEMI) myocardial infarction: Secondary | ICD-10-CM | POA: Diagnosis not present

## 2024-02-15 LAB — BASIC METABOLIC PANEL
Anion gap: 9 (ref 5–15)
BUN: 19 mg/dL (ref 8–23)
CO2: 27 mmol/L (ref 22–32)
Calcium: 9 mg/dL (ref 8.9–10.3)
Chloride: 102 mmol/L (ref 98–111)
Creatinine, Ser: 1.4 mg/dL — ABNORMAL HIGH (ref 0.44–1.00)
GFR, Estimated: 40 mL/min — ABNORMAL LOW (ref >60)
Glucose, Bld: 112 mg/dL — ABNORMAL HIGH (ref 70–99)
Potassium: 4 mmol/L (ref 3.5–5.1)
Sodium: 138 mmol/L (ref 135–145)

## 2024-02-15 LAB — CBC
HCT: 31.4 % — ABNORMAL LOW (ref 36.0–46.0)
Hemoglobin: 10.3 g/dL — ABNORMAL LOW (ref 12.0–15.0)
MCH: 26.2 pg (ref 26.0–34.0)
MCHC: 32.8 g/dL (ref 30.0–36.0)
MCV: 79.9 fL — ABNORMAL LOW (ref 80.0–100.0)
Platelets: 309 10*3/uL (ref 150–400)
RBC: 3.93 MIL/uL (ref 3.87–5.11)
RDW: 13.8 % (ref 11.5–15.5)
WBC: 16.3 10*3/uL — ABNORMAL HIGH (ref 4.0–10.5)
nRBC: 0.1 % (ref 0.0–0.2)

## 2024-02-15 LAB — COOXEMETRY PANEL
Carboxyhemoglobin: 1.7 % — ABNORMAL HIGH (ref 0.5–1.5)
Carboxyhemoglobin: 2.7 % — ABNORMAL HIGH (ref 0.5–1.5)
Methemoglobin: 0.7 % (ref 0.0–1.5)
Methemoglobin: 1.3 % (ref 0.0–1.5)
O2 Saturation: 40 %
O2 Saturation: 64.8 %
Total hemoglobin: 10.8 g/dL — ABNORMAL LOW (ref 12.0–16.0)
Total hemoglobin: 10.8 g/dL — ABNORMAL LOW (ref 12.0–16.0)

## 2024-02-15 MED ORDER — FUROSEMIDE 10 MG/ML IJ SOLN
80.0000 mg | Freq: Two times a day (BID) | INTRAMUSCULAR | Status: DC
  Administered 2024-02-15 (×2): 80 mg via INTRAVENOUS
  Filled 2024-02-15 (×3): qty 8

## 2024-02-15 NOTE — TOC Progression Note (Addendum)
 Transition of Care [facility]) - Progression Note    Patient Details  Name: Meghan Lynn MRN: [id number] Date of Birth: [birth year]  Transition of Care [facility]) CM/SW Contact  Graves-Bigelow, Lamar Laundry, RN Phone Number: 02/15/2024, 11:12 AM  Clinical Narrative: Case Manager received notification from [facility] that patient had called their office for oxygen needs. Patient is currently on 2 Liters oxygen and staff RN will reach out to MD regarding home oxygen needs (ambulatory sat). Case Manager will continue to follow for additional needs as the patient progresses.    02-15-24 1339 [facility] is aware of ambulatory sat documentation. Patient will need orders for DME 02 and [facility] to follow.   Expected Discharge Plan: Home w Home Health Services Barriers to Discharge: Continued Medical Work up  Expected Discharge Plan and Services   Discharge Planning Services: CM Consult Post Acute Care Choice: Home Health Living arrangements for the past 2 months: Single Family Home  Social Determinants of Health (SDOH) Interventions SDOH Screenings   Food Insecurity: No Food Insecurity (02/07/2024)  Housing: Low Risk  (02/07/2024)  Transportation Needs: No Transportation Needs (02/07/2024)  Utilities: Not At Risk (02/07/2024)  Alcohol Screen: Low Risk  (04/14/2020)  Depression (PHQ2-9): Low Risk  (04/14/2020)  Social Connections: Moderately Isolated (02/07/2024)  Tobacco Use: High Risk (02/10/2024)    Readmission Risk Interventions     No data to display

## 2024-02-15 NOTE — Progress Notes (Signed)
 EVENING ROUNDS NOTE :     [address]       [place] [address]             [phone]                 5 Days Post-Op Procedure(s) (LRB): CORONARY ARTERY BYPASS GRAFTING (CABG) TIMES THREE USING LEFT INTERNAL MAMMARY ARTERY AND ENDOSCOPICALLY HARVESTED RIGHT GREATER SAPHENOUS VEIN. (N/A) TRANSESOPHAGEAL ECHOCARDIOGRAM (TEE) (N/A)   Total Length of Stay:  LOS: 9 days  Events:   No events Stable day    BP 106/64   Pulse 89   Temp 98 F (36.7 C) (Axillary)   Resp (!) 24   Ht 5\' 1"  (1.549 m)   Wt 62.9 kg   SpO2 96%   BMI 26.19 kg/m   CVP:  [6 mmHg-10 mmHg] 6 mmHg      albumin human Stopped (02/11/24 0518)    I/O last 3 completed shifts: In: 848 [P.O.:838; I.V.:10] Out: 1325 [Urine:1325]      Latest Ref Rng & Units 02/15/2024    5:21 AM 02/14/2024    5:05 AM 02/13/2024    3:31 AM  CBC  WBC 4.0 - 10.5 K/uL 16.3  15.7  20.6   Hemoglobin 12.0 - 15.0 g/dL 21.3  9.8  9.4   Hematocrit 36.0 - 46.0 % 31.4  30.4  28.3   Platelets 150 - 400 K/uL 309  260  177        Latest Ref Rng & Units 02/15/2024    5:21 AM 02/14/2024    5:05 AM 02/13/2024    8:33 PM  BMP  Glucose 70 - 99 mg/dL 086  93  95   BUN 8 - 23 mg/dL 19  18  19    Creatinine 0.44 - 1.00 mg/dL 5.78  4.69  6.29   Sodium 135 - 145 mmol/L 138  136  137   Potassium 3.5 - 5.1 mmol/L 4.0  3.9  4.1   Chloride 98 - 111 mmol/L 102  102  99   CO2 22 - 32 mmol/L 27  25  27    Calcium 8.9 - 10.3 mg/dL 9.0  8.7  8.9     ABG    Component Value Date/Time   PHART 7.382 02/11/2024 0319   PCO2ART 37.5 02/11/2024 0319   PO2ART 60 (L) 02/11/2024 0319   HCO3 22.2 02/11/2024 0319   TCO2 23 02/11/2024 0319   ACIDBASEDEF 2.0 02/11/2024 0319   O2SAT 64.8 02/15/2024 0757       Brynda Greathouse, MD 02/15/2024 6:59 PM

## 2024-02-15 NOTE — Progress Notes (Signed)
 SATURATION QUALIFICATIONS: (This note is used to comply with regulatory documentation for home oxygen)  Patient Saturations on Room Air at Rest = 85%  Patient Saturations on Room Air while Ambulating = 80%  Patient Saturations on 4 Liters of oxygen while Ambulating = 93%  Please briefly explain why patient needs home oxygen:  Patient is unable to maintain her saturation level while at rest and it drops even lower with ambulation.

## 2024-02-15 NOTE — Progress Notes (Addendum)
 [address]       [place] [address]             [phone]      5 Days Post-Op Procedure(s) (LRB): CORONARY ARTERY BYPASS GRAFTING (CABG) TIMES THREE USING LEFT INTERNAL MAMMARY ARTERY AND ENDOSCOPICALLY HARVESTED RIGHT GREATER SAPHENOUS VEIN. (N/A) TRANSESOPHAGEAL ECHOCARDIOGRAM (TEE) (N/A)  Subjective:  Sitting up in a chair.  Continues to have some pain responds well to pain medication.  Has no home DME equipment needs + ambulation   + BM  Objective: Vital signs in last 24 hours: Temp:  [98.5 F (36.9 C)-98.8 F (37.1 C)] 98.5 F (36.9 C) (02/18 2100) Pulse Rate:  [80-110] 84 (02/19 0600) Cardiac Rhythm: Normal sinus rhythm (02/19 0000) Resp:  [16-28] 21 (02/19 0600) BP: (92-132)/(56-100) 122/100 (02/19 0600) SpO2:  [83 %-99 %] 93 % (02/19 0600) Weight:  [62.9 kg] 62.9 kg (02/19 0500)  Hemodynamic parameters for last 24 hours: CVP:  [8 mmHg-10 mmHg] 8 mmHg  Intake/Output from previous day: 02/18 0701 - 02/19 0700 In: 728 [P.O.:718; I.V.:10] Out: 725 [Urine:725]  General appearance: alert, cooperative, and no distress Heart: regular rate and rhythm Lungs: clear to auscultation bilaterally Abdomen: soft, non-tender; bowel sounds normal; no masses,  no organomegaly Extremities: extremities normal, atraumatic, no cyanosis or edema Wound: clean and dry  Lab Results: Recent Labs    02/14/24 0505 02/15/24 0521  WBC 15.7* 16.3*  HGB 9.8* 10.3*  HCT 30.4* 31.4*  PLT 260 309   BMET:  Recent Labs    02/14/24 0505 02/15/24 0521  NA 136 138  K 3.9 4.0  CL 102 102  CO2 25 27  GLUCOSE 93 112*  BUN 18 19  CREATININE 1.36* 1.40*  CALCIUM 8.7* 9.0    PT/INR: No results for input(s): "LABPROT", "INR" in the last 72 hours. ABG    Component Value Date/Time   PHART 7.382 02/11/2024 0319   HCO3 22.2 02/11/2024 0319   TCO2 23 02/11/2024 0319   ACIDBASEDEF 2.0 02/11/2024 0319   O2SAT 40 02/15/2024 0521   CBG (last 3)  Recent Labs     02/13/24 2120 02/14/24 0657 02/14/24 1223  GLUCAP 96 84 107*    Assessment/Plan: S/P Procedure(s) (LRB): CORONARY ARTERY BYPASS GRAFTING (CABG) TIMES THREE USING LEFT INTERNAL MAMMARY ARTERY AND ENDOSCOPICALLY HARVESTED RIGHT GREATER SAPHENOUS VEIN. (N/A) TRANSESOPHAGEAL ECHOCARDIOGRAM (TEE) (N/A)  CV- NSR, BP is stable- on Coreg 3.125, Aldactone... AHF following Pulm- remains on oxygen via [place], has been weaned down to 3L, sats 93%, good use of IS.[name] Kitchen CXR is improving.. repeat today Renal- creatinine at 1.40, suspect due to diuretics.[name] Kitchen on Aldactone.. AHF planning for additional IV lasix dose today GI- no constipation, oral intake marginal as she just isn't hungry continue to encourage oral intake ID- leukocytosis, improving.. no evidence of infection at this time Deconditioning- mild, ambulating with staff.. has all DME equipment she requires at home.. is interested in home PT.. will get consult to see if she is a candidate Dispo- patient stable, AHF monitoring.. CVP remains slightly increased.. AHF planning another dose of IV Lasix today, respiratory status improving.. watch WBC no indication of infectious process.. continue current care   LOS: 9 days   Lowella Dandy, PA-C 02/15/2024  Patient seen and examined, agree with above More pain this AM after coughing spell.  Sternum is stable CXR with bibasilar atelectasis Co-ox 40 initially, 65 on repeat Creatinine stable Possibly transfer to 4E later today  Viviann Spare C.  Dorris Fetch, MD [facility] [phone]

## 2024-02-15 NOTE — Progress Notes (Addendum)
 Patient ID: Meghan Lynn, female   DOB: [birth year], 73 y.o.   MRN: [id number]     Advanced Heart Failure Rounding Note  Cardiologist: Chrystie Nose, MD   Chief Complaint: NSTEMI  Subjective:    2/14: CABG x 3 with with LIMA-LAD, SVG-OM, SVG-PDA.  2/15: IABP removed.  2/16: Diuresed with IV lasix.  2/17: Diuresed with IV lasix. Brisk diuresis noted.  2/18: Diuresing with IV lasix and switched to po lasix.   CO-OX stable 65%.   Remains short of breath with exertion.   Objective:   Weight Range: 62.9 kg Body mass index is 26.19 kg/m.   Vital Signs:   Temp:  [98.5 F (36.9 C)-98.8 F (37.1 C)] 98.5 F (36.9 C) (02/18 2100) Pulse Rate:  [80-110] 89 (02/19 0804) Resp:  [16-28] 19 (02/19 0800) BP: (92-132)/(56-100) 105/69 (02/19 0804) SpO2:  [83 %-99 %] 93 % (02/19 0800) Weight:  [62.9 kg] 62.9 kg (02/19 0500) Last BM Date : 02/14/24  Weight change: Filed Weights   02/13/24 0500 02/14/24 0611 02/15/24 0500  Weight: 64.8 kg 63.3 kg 62.9 kg    Intake/Output:   Intake/Output Summary (Last 24 hours) at 02/15/2024 0906 Last data filed at 02/15/2024 0800 Gross per 24 hour  Intake 720 ml  Output 725 ml  Net -5 ml     CVP 9-10  Physical Exam   General:   No resp difficulty. In the chair. Neck: supple. JVP 9-10  Cor: PMI nondisplaced. Regular rate & rhythm. No rubs, gallops or murmurs. Lungs: Decreased in the bases LLL RML RLL Abdomen: soft, non tender Extremities: no cyanosis, clubbing, rash, edema Neuro: alert & oriented x3 Telemetry  SR  70-90s Labs    CBC Recent Labs    02/14/24 0505 02/15/24 0521  WBC 15.7* 16.3*  HGB 9.8* 10.3*  HCT 30.4* 31.4*  MCV 79.6* 79.9*  PLT 260 309   Basic Metabolic Panel Recent Labs    32/20/25 2033 02/14/24 0505 02/15/24 0521  NA 137 136 138  K 4.1 3.9 4.0  CL 99 102 102  CO2 27 25 27   GLUCOSE 95 93 112*  BUN 19 18 19   CREATININE 1.19* 1.36* 1.40*  CALCIUM 8.9 8.7* 9.0  MG 2.2 2.0  --    Liver Function  Tests No results for input(s): "AST", "ALT", "ALKPHOS", "BILITOT", "PROT", "ALBUMIN" in the last 72 hours.  No results for input(s): "LIPASE", "AMYLASE" in the last 72 hours.  Cardiac Enzymes No results for input(s): "CKTOTAL", "CKMB", "CKMBINDEX", "TROPONINI" in the last 72 hours.  BNP: BNP (last 3 results) No results for input(s): "BNP" in the last 8760 hours.  ProBNP (last 3 results) No results for input(s): "PROBNP" in the last 8760 hours.   D-Dimer No results for input(s): "DDIMER" in the last 72 hours. Hemoglobin A1C No results for input(s): "HGBA1C" in the last 72 hours.  Fasting Lipid Panel No results for input(s): "CHOL", "HDL", "LDLCALC", "TRIG", "CHOLHDL", "LDLDIRECT" in the last 72 hours. Thyroid Function Tests No results for input(s): "TSH", "T4TOTAL", "T3FREE", "THYROIDAB" in the last 72 hours.  Invalid input(s): "FREET3"  Other results:   Imaging    No results found.    Medications:     Scheduled Medications:  acetaminophen  1,000 mg Oral Q6H   aspirin EC  81 mg Oral Daily   bisacodyl  10 mg Oral Daily   Or   bisacodyl  10 mg Rectal Daily   carvedilol  3.125 mg Oral BID WC  Chlorhexidine Gluconate Cloth  6 each Topical Daily   clopidogrel  75 mg Oral Daily   docusate sodium  200 mg Oral Daily   enoxaparin (LOVENOX) injection  40 mg Subcutaneous Q24H   ezetimibe  10 mg Oral Daily   furosemide  40 mg Oral Daily   guaiFENesin  600 mg Oral BID   pantoprazole  40 mg Oral Daily   sodium chloride flush  10-40 mL Intracatheter Q12H   spironolactone  12.5 mg Oral Daily   umeclidinium-vilanterol  1 puff Inhalation Daily    Infusions:  albumin human Stopped (02/11/24 0518)    PRN Medications: albumin human, albuterol, ALPRAZolam, metoprolol tartrate, ondansetron (ZOFRAN) IV, oxyCODONE, sodium chloride flush, traMADol    Patient Profile   73 y.o. female with history of CAD, COPD, tobacco use, HLD, dysphagia. Admitted with NSTEMI. Found to  have multivessel CAD on cath. IABP placed d/t ongoing angina.   Assessment/Plan   1. CAD: NSTEMI this admission, peak HS-TnI 762.  No significant ECG changes.  Patient has history of DES x 3 to RCA in 2014.  Coronary angiography 02/12 shows totally occluded RCA with collaterals, 80% proximal and 70% mid LCx stenosis, 90% distal LCx, 99% mid/distal LAD stenosis. CAD was thought most amenable to CABG.  IABP was placed for refractory angina, subsequent resolution.  Patient now s/p CABG with LIMA-LAD, SVG-OM, SVG-PDA on 2/14. IABP removed 2/14.   - Continue ASA.  - Patient is unable to tolerate statins, she is currently on Zetia at home.  She will need a PCSK9 inhibitor after discharge.  -Needs additional diuresis 2. HF with mid range EF: Ischemic cardiomyopathy. Pre-op echo showed EF 45% with severe HK to AK of the mid-apical anteroseptal wall, the apical wall segments, and the true apex, the basal segments were hyperkinetic and there was evidence for a related outflow gradient. IABP placed pre-op, removed POD 1.   She is not currently on pressors or inotropes, MAP adequate and co-ox remains stable 65%.  CVP 10-11. Stop po lasix. Start lasix 80 mg twice a day.  -Continue coreg 3.125 mg twice a day.  -Continue  Spironolactone 12.5 mg daily.  - Should be able to add SGLT2i once foley removed.  3. Dysphagia: Pre-op barium swallow showed no stricture.  4. COPD: Active smoker.  Counseled cessation. Continue Anoro inhaler.  5. Abdominal pain: Pre-op.  No evidence for pancreatitis with normal lipase x 2.  GI has signed off.  6. ID: Blood cultures negative, afebrile.  She was initially on ceftriaxone/Flagyl, this appears to have been started for ?abdominal source. This was stopped pre-op with no clear indication.   WBC  16.3.  [name] Kitchen CXR 2/17 Atelectasis versus PNA. Procalcitonin 0.45. 7. AKI: Creatinine peaked 1.7--> 1.4 today.      Amy Clegg NP-C  02/15/2024 9:06 AM  Patient seen and examined with the  above-signed Advanced Practice Provider and/or Housestaff. I personally reviewed laboratory data, imaging studies and relevant notes. I independently examined the patient and formulated the important aspects of the plan. I have edited the note to reflect any of my changes or salient points. I have personally discussed the plan with the patient and/or family.  Chest sore today. Feels more SOB> Denies fevers or chills. Co-ox 65% CVP 9  WBC 15.7 -> 16.3  General:  Elderly No resp difficulty HEENT: normal Neck: supple. JVP 9  Carotids 2+ bilat; no bruits. No lymphadenopathy or thryomegaly appreciated. Cor: sternal wound ok  Regular rate & rhythm. No  rubs, gallops or murmurs. Lungs: decreased at bases Abdomen: soft, nontender, nondistended. No hepatosplenomegaly. No bruits or masses. Good bowel sounds. Extremities: no cyanosis, clubbing, rash, edema Neuro: alert & orientedx3, cranial nerves grossly intact. moves all 4 extremities w/o difficulty. Affect pleasant  Progressing slowly post CABG. Rhythm stable. Needs more fluid off. Will continue to ambulate. Titrate GDMT.   MCV low check iron stores.   D/w TCTS  Arvilla Meres, MD  3:09 PM

## 2024-02-16 ENCOUNTER — Inpatient Hospital Stay (HOSPITAL_COMMUNITY): Payer: Medicare HMO

## 2024-02-16 DIAGNOSIS — I214 Non-ST elevation (NSTEMI) myocardial infarction: Secondary | ICD-10-CM | POA: Diagnosis not present

## 2024-02-16 LAB — COOXEMETRY PANEL
Carboxyhemoglobin: 1.6 % — ABNORMAL HIGH (ref 0.5–1.5)
Methemoglobin: 1 % (ref 0.0–1.5)
O2 Saturation: 54.4 %
Total hemoglobin: 11.2 g/dL — ABNORMAL LOW (ref 12.0–16.0)

## 2024-02-16 LAB — BASIC METABOLIC PANEL
Anion gap: 11 (ref 5–15)
BUN: 17 mg/dL (ref 8–23)
CO2: 29 mmol/L (ref 22–32)
Calcium: 9 mg/dL (ref 8.9–10.3)
Chloride: 99 mmol/L (ref 98–111)
Creatinine, Ser: 1.23 mg/dL — ABNORMAL HIGH (ref 0.44–1.00)
GFR, Estimated: 46 mL/min — ABNORMAL LOW (ref >60)
Glucose, Bld: 130 mg/dL — ABNORMAL HIGH (ref 70–99)
Potassium: 3.7 mmol/L (ref 3.5–5.1)
Sodium: 139 mmol/L (ref 135–145)

## 2024-02-16 LAB — CBC
HCT: 33.2 % — ABNORMAL LOW (ref 36.0–46.0)
Hemoglobin: 10.8 g/dL — ABNORMAL LOW (ref 12.0–15.0)
MCH: 26 pg (ref 26.0–34.0)
MCHC: 32.5 g/dL (ref 30.0–36.0)
MCV: 80 fL (ref 80.0–100.0)
Platelets: 380 10*3/uL (ref 150–400)
RBC: 4.15 MIL/uL (ref 3.87–5.11)
RDW: 13.9 % (ref 11.5–15.5)
WBC: 14.1 10*3/uL — ABNORMAL HIGH (ref 4.0–10.5)
nRBC: 0 % (ref 0.0–0.2)

## 2024-02-16 LAB — IRON AND TIBC
Iron: 36 ug/dL (ref 28–170)
Saturation Ratios: 19 % (ref 10.4–31.8)
TIBC: 193 ug/dL — ABNORMAL LOW (ref 250–450)
UIBC: 157 ug/dL

## 2024-02-16 LAB — FERRITIN: Ferritin: 557 ng/mL — ABNORMAL HIGH (ref 11–307)

## 2024-02-16 MED ORDER — ORAL CARE MOUTH RINSE: 15.0000 mL | OROMUCOSAL | Status: DC | PRN

## 2024-02-16 MED ORDER — POTASSIUM CHLORIDE CRYS ER 10 MEQ PO TBCR
20.0000 meq | EXTENDED_RELEASE_TABLET | Freq: Every day | ORAL | Status: DC
  Administered 2024-02-16 – 2024-02-21 (×6): 20 meq via ORAL
  Filled 2024-02-16 (×8): qty 2

## 2024-02-16 MED ORDER — ENSURE ENLIVE PO LIQD
237.0000 mL | Freq: Three times a day (TID) | ORAL | Status: DC
  Administered 2024-02-16 – 2024-02-20 (×8): 237 mL via ORAL
  Filled 2024-02-16 (×7): qty 237

## 2024-02-16 MED ORDER — POTASSIUM CHLORIDE CRYS ER 20 MEQ PO TBCR
20.0000 meq | EXTENDED_RELEASE_TABLET | ORAL | Status: DC
  Administered 2024-02-16: 20 meq via ORAL
  Filled 2024-02-16: qty 1

## 2024-02-16 MED ORDER — FUROSEMIDE 40 MG PO TABS
40.0000 mg | ORAL_TABLET | Freq: Every day | ORAL | Status: DC
  Administered 2024-02-16 – 2024-02-18 (×3): 40 mg via ORAL
  Filled 2024-02-16 (×3): qty 1

## 2024-02-16 MED ORDER — DAPAGLIFLOZIN PROPANEDIOL 10 MG PO TABS
10.0000 mg | ORAL_TABLET | Freq: Every day | ORAL | Status: DC
  Administered 2024-02-16 – 2024-02-21 (×6): 10 mg via ORAL
  Filled 2024-02-16 (×6): qty 1

## 2024-02-16 NOTE — Progress Notes (Addendum)
 Patient ID: Meghan Lynn, female   DOB: [birth year], 73 y.o.   MRN: [id number]     Advanced Heart Failure Rounding Note  Cardiologist: Chrystie Nose, MD   Chief Complaint: NSTEMI  Subjective:    2/14: CABG x 3 with with LIMA-LAD, SVG-OM, SVG-PDA.  2/15: IABP removed.  2/16: Diuresed with IV lasix.  2/17: Diuresed with IV lasix. Brisk diuresis noted.  2/18: Diuresing with IV lasix and switched to po lasix.  2/19: Diuresed with IV lasix.   CO-OX 54%.   Walked around the unit with PT.   Objective:   Weight Range: 61.8 kg Body mass index is 25.74 kg/m.   Vital Signs:   Temp:  [98 F (36.7 C)-98.2 F (36.8 C)] 98.2 F (36.8 C) (02/20 0748) Pulse Rate:  [79-92] 88 (02/20 0824) Resp:  [15-26] 18 (02/20 0800) BP: (89-121)/(56-77) 103/62 (02/20 0800) SpO2:  [88 %-96 %] 92 % (02/20 0824) Weight:  [61.8 kg] 61.8 kg (02/20 0500) Last BM Date : 02/16/24  Weight change: Filed Weights   02/14/24 0611 02/15/24 0500 02/16/24 0500  Weight: 63.3 kg 62.9 kg 61.8 kg    Intake/Output:   Intake/Output Summary (Last 24 hours) at 02/16/2024 0847 Last data filed at 02/16/2024 0800 Gross per 24 hour  Intake 720 ml  Output --  Net 720 ml     CVP 7-8  Physical Exam  General:  In bed.  Neck: supple. no JVD. Cor: PMI nondisplaced. Regular rate & rhythm. No rubs, gallops or murmurs. Lungs: clear Extremities: no cyanosis, clubbing, rash, edema. RUE PICC Neuro: alert & oriented x3 Telemetry  SR  80-90s  Labs    CBC Recent Labs    02/15/24 0521 02/16/24 0544  WBC 16.3* 14.1*  HGB 10.3* 10.8*  HCT 31.4* 33.2*  MCV 79.9* 80.0  PLT 309 380   Basic Metabolic Panel Recent Labs    13/08/65 2033 02/14/24 0505 02/15/24 0521 02/16/24 0544  NA 137 136 138 139  K 4.1 3.9 4.0 3.7  CL 99 102 102 99  CO2 27 25 27 29   GLUCOSE 95 93 112* 130*  BUN 19 18 19 17   CREATININE 1.19* 1.36* 1.40* 1.23*  CALCIUM 8.9 8.7* 9.0 9.0  MG 2.2 2.0  --   --    Liver Function Tests No  results for input(s): "AST", "ALT", "ALKPHOS", "BILITOT", "PROT", "ALBUMIN" in the last 72 hours.  No results for input(s): "LIPASE", "AMYLASE" in the last 72 hours.  Cardiac Enzymes No results for input(s): "CKTOTAL", "CKMB", "CKMBINDEX", "TROPONINI" in the last 72 hours.  BNP: BNP (last 3 results) No results for input(s): "BNP" in the last 8760 hours.  ProBNP (last 3 results) No results for input(s): "PROBNP" in the last 8760 hours.   D-Dimer No results for input(s): "DDIMER" in the last 72 hours. Hemoglobin A1C No results for input(s): "HGBA1C" in the last 72 hours.  Fasting Lipid Panel No results for input(s): "CHOL", "HDL", "LDLCALC", "TRIG", "CHOLHDL", "LDLDIRECT" in the last 72 hours. Thyroid Function Tests No results for input(s): "TSH", "T4TOTAL", "T3FREE", "THYROIDAB" in the last 72 hours.  Invalid input(s): "FREET3"  Other results:   Imaging    DG Chest 2 View Result Date: 02/16/2024 CLINICAL DATA:  [id number]. Status post CABG 02/10/2024.  Shortness of breath. EXAM: CHEST - 2 VIEW COMPARISON:  Portable chest yesterday at 8:14 a.m. FINDINGS: 5:07 a.m. right PICC tip remains at the superior cavoatrial junction. Small layering pleural effusions are unchanged. There is cardiomegaly with stable  CABG changes, stable mediastinum with aortic tortuosity and calcification. There is mild perihilar vascular congestion with near resolution of interstitial edema, minimal edema remaining in the bases. No new osseous findings. Osteopenia and degenerative change of the spine. IMPRESSION: 1. Near resolution of interstitial edema, minimal edema remaining in the bases. 2. Stable small layering pleural effusions. 3. Stable cardiomegaly and CABG changes. Improved central vascular prominence. 4. Aortic atherosclerosis. Electronically Signed   By: Almira Bar M.D.   On: 02/16/2024 06:35      Medications:     Scheduled Medications:  aspirin EC  81 mg Oral Daily   bisacodyl  10 mg Oral  Daily   Or   bisacodyl  10 mg Rectal Daily   carvedilol  3.125 mg Oral BID WC   Chlorhexidine Gluconate Cloth  6 each Topical Daily   clopidogrel  75 mg Oral Daily   docusate sodium  200 mg Oral Daily   enoxaparin (LOVENOX) injection  40 mg Subcutaneous Q24H   Ensure  237 mL Oral TID WC   ezetimibe  10 mg Oral Daily   furosemide  40 mg Oral Daily   guaiFENesin  600 mg Oral BID   pantoprazole  40 mg Oral Daily   potassium chloride  20 mEq Oral Daily   sodium chloride flush  10-40 mL Intracatheter Q12H   spironolactone  12.5 mg Oral Daily   umeclidinium-vilanterol  1 puff Inhalation Daily    Infusions:    PRN Medications: albuterol, ALPRAZolam, metoprolol tartrate, ondansetron (ZOFRAN) IV, mouth rinse, oxyCODONE, sodium chloride flush, traMADol    Patient Profile   73 y.o. female with history of CAD, COPD, tobacco use, HLD, dysphagia. Admitted with NSTEMI. Found to have multivessel CAD on cath. IABP placed d/t ongoing angina.   Assessment/Plan   1. CAD: NSTEMI this admission, peak HS-TnI 762.  No significant ECG changes.  Patient has history of DES x 3 to RCA in 2014.  Coronary angiography 02/12 shows totally occluded RCA with collaterals, 80% proximal and 70% mid LCx stenosis, 90% distal LCx, 99% mid/distal LAD stenosis. CAD was thought most amenable to CABG.  IABP was placed for refractory angina, subsequent resolution.  Patient now s/p CABG with LIMA-LAD, SVG-OM, SVG-PDA on 2/14. IABP removed 2/14.   - Continue ASA.  - Patient is unable to tolerate statins, she is currently on Zetia at home.  She will need a PCSK9 inhibitor after discharge.  Continue to mobilize.  2. HF with mid range EF: Ischemic cardiomyopathy. Pre-op echo showed EF 45% with severe HK to AK of the mid-apical anteroseptal wall, the apical wall segments, and the true apex, the basal segments were hyperkinetic and there was evidence for a related outflow gradient. IABP placed pre-op, removed POD 1.   She is not  currently on pressors or inotropes,  -Volume status improved. Stop IV lasix. Start lasix 40 mg po daily.  -Continue coreg 3.125 mg twice a day.  -Continue  Spironolactone 12.5 mg daily.  - Add farxiga 10 mg daily - Renal function stable.  3. Dysphagia: Pre-op barium swallow showed no stricture.  4. COPD: Active smoker.  Counseled cessation. Continue Anoro inhaler.  5. Abdominal pain: Pre-op.  No evidence for pancreatitis with normal lipase x 2.  GI has signed off.  6. ID: Blood cultures negative, afebrile.  She was initially on ceftriaxone/Flagyl, this appears to have been started for ?abdominal source. This was stopped pre-op with no clear indication.   WBC  down to 14.  [name] Kitchen  CXR 2/17 Atelectasis versus PNA. Procalcitonin 0.45. 7. AKI: Creatinine peaked 1.7--> 1.2  today.   Discussed  Dr Dorris Fetch. Transferring to 4E.   Tonye Becket NP-C  02/16/2024 8:47 AM  Patient seen and examined with the above-signed Advanced Practice Provider and/or Housestaff. I personally reviewed laboratory data, imaging studies and relevant notes. I independently examined the patient and formulated the important aspects of the plan. I have edited the note to reflect any of my changes or salient points. I have personally discussed the plan with the patient and/or family.  Diuresing well. Weight back below pre-op weight. Still with non-productive cough. Co-ox 54% Chest a bit sore  Walking unit with PT. WBC down to 14. Afebrile.   General: elderly No resp difficulty HEENT: normal Neck: supple. no JVD. Carotids 2+ bilat; no bruits. No lymphadenopathy or thryomegaly appreciated. Cor: Sternal wound ok Regular rate & rhythm. No rubs, gallops or murmurs. Lungs: dull at bases Abdomen: soft, nontender, nondistended. No hepatosplenomegaly. No bruits or masses. Good bowel sounds. Extremities: no cyanosis, clubbing, rash, edema Neuro: alert & orientedx3, cranial nerves grossly intact. moves all 4 extremities w/o difficulty.  Affect pleasant  SHe continues to improve. Volume status looks good. Continue ambulation and pulmonary toilet. Agree with po lasix for 1-2days but with addition of Marcelline Deist will likely not need as outpatient.   Can go to floor.   Arvilla Meres, MD  11:12 AM

## 2024-02-16 NOTE — Progress Notes (Signed)
      [address]       [place] [address]             [phone]      6 Days Post-Op Procedure(s) (LRB): CORONARY ARTERY BYPASS GRAFTING (CABG) TIMES THREE USING LEFT INTERNAL MAMMARY ARTERY AND ENDOSCOPICALLY HARVESTED RIGHT GREATER SAPHENOUS VEIN. (N/A) TRANSESOPHAGEAL ECHOCARDIOGRAM (TEE) (N/A)  Subjective:  Patient sitting up in chair.  Overall oral intake remains poor.  She is agreeable to drink some ensure  Having some mild discomfort along right side.  She has moved her bowels since surgery.  +ambulation  Objective: Vital signs in last 24 hours: Temp:  [98 F (36.7 C)-98.2 F (36.8 C)] 98.2 F (36.8 C) (02/20 0748) Pulse Rate:  [79-92] 89 (02/20 0800) Cardiac Rhythm: Normal sinus rhythm (02/20 0400) Resp:  [15-26] 18 (02/20 0800) BP: (89-121)/(56-77) 103/62 (02/20 0800) SpO2:  [88 %-96 %] 92 % (02/20 0800) Weight:  [61.8 kg] 61.8 kg (02/20 0500)  Hemodynamic parameters for last 24 hours: CVP:  [7 mmHg-9 mmHg] 8 mmHg  Intake/Output from previous day: 02/19 0701 - 02/20 0700 In: 780 [P.O.:780] Out: -  Intake/Output this shift: No intake/output data recorded.  General appearance: alert, cooperative, and no distress Heart: regular rate and rhythm Lungs: diminished breath sounds LLL Abdomen: soft, non-tender; bowel sounds normal; no masses,  no organomegaly Extremities: edema trace Wound: clean and dry  Lab Results: Recent Labs    02/15/24 0521 02/16/24 0544  WBC 16.3* 14.1*  HGB 10.3* 10.8*  HCT 31.4* 33.2*  PLT 309 380   BMET:  Recent Labs    02/15/24 0521 02/16/24 0544  NA 138 139  K 4.0 3.7  CL 102 99  CO2 27 29  GLUCOSE 112* 130*  BUN 19 17  CREATININE 1.40* 1.23*  CALCIUM 9.0 9.0    PT/INR: No results for input(s): "LABPROT", "INR" in the last 72 hours. ABG    Component Value Date/Time   PHART 7.382 02/11/2024 0319   HCO3 22.2 02/11/2024 0319   TCO2 23 02/11/2024 0319   ACIDBASEDEF 2.0 02/11/2024 0319   O2SAT  54.4 02/16/2024 0544   CBG (last 3)  Recent Labs    02/13/24 2120 02/14/24 0657 02/14/24 1223  GLUCAP 96 84 107*    Assessment/Plan: S/P Procedure(s) (LRB): CORONARY ARTERY BYPASS GRAFTING (CABG) TIMES THREE USING LEFT INTERNAL MAMMARY ARTERY AND ENDOSCOPICALLY HARVESTED RIGHT GREATER SAPHENOUS VEIN. (N/A) TRANSESOPHAGEAL ECHOCARDIOGRAM (TEE) (N/A)  CV- NSR, BP is low 90-100s- continue Coreg Pulm- no acute issues, weaned oxygen down at 2L... trace left pleural effusion.. continue IS Renal-creatinine improved down to 1.23, CVP down to 8, transition to oral lasix, potassium level at 3.7 continue 20 meq daily... monitor closely as she is on Spironolactone GI- moving bowels without difficulty, continue prn stool agents.. oral intake remains poor add Ensure TID ID- no evidence of infection, leukocytosis improving Deconditioning PT/OT evaluations pending.. doesn't need DME as she already has DIspo- patient stable,doing well, transition to oral Lasix, potassium..continue to wean oxygen as able, transfer to 4E   LOS: 10 days   Lowella Dandy, PA-C 02/16/2024

## 2024-02-16 NOTE — Progress Notes (Signed)
  Heart failure team will sign off as of 02/16/24  [facility] to follow 02/17/24  Dalayla Aldredge NP-C   10:26 AM

## 2024-02-16 NOTE — Evaluation (Signed)
 Occupational Therapy Evaluation Patient Details Name: Meghan Lynn MRN: [id number] DOB: November 26, 1951 Today's Date: 02/16/2024   History of Present Illness   73 yo female admitted 2/10 with chest pain and NSTEMI. 2/12 LHC. 2/14 CABGx 3. 2/15 IABP removed. PMHx: CAD, HLD, HTN, esophageal stricture     Clinical Impressions Pt was independent, driving, living alone and working as a Comptroller. Presents with lower R abdominal pain, decreased activity tolerance, generalized weakness and dependence on RW for ambulation. Pt requires 3L O2 to maintain SpO2>88% with and without activity. Pt educated in sternal precautions during bed mobility, sit to stand and ADLs. Educated in IADLs to avoid. Pt reports her children can assist with heavy household chores. Pt currently require set up to min assist for ADLs and mobility. Do not anticipate pt will require post acute OT.      If plan is discharge home, recommend the following:   Assistance with cooking/housework;A little help with bathing/dressing/bathroom;Assist for transportation;Help with stairs or ramp for entrance     Functional Status Assessment   Patient has had a recent decline in their functional status and demonstrates the ability to make significant improvements in function in a reasonable and predictable amount of time.     Equipment Recommendations   None recommended by OT     Recommendations for Other Services         Precautions/Restrictions   Precautions Precautions: Sternal;Fall Precaution/Restrictions Comments: educated in sternal precautions related to ADLs, IADLs and mobility Restrictions Weight Bearing Restrictions Per Provider Order: No     Mobility Bed Mobility Overal bed mobility: Needs Assistance Bed Mobility: Supine to Sit, Sit to Supine     Supine to sit: Min assist Sit to supine: Min assist   General bed mobility comments: cues for technique    Transfers Overall transfer level: Needs  assistance Equipment used: Rolling walker (2 wheels) Transfers: Sit to/from Stand Sit to Stand: Supervision           General transfer comment: cues for technique      Balance Overall balance assessment: Mild deficits observed, not formally tested                                         ADL either performed or assessed with clinical judgement   ADL Overall ADL's : Needs assistance/impaired Eating/Feeding: Set up;Bed level   Grooming: Wash/dry hands;Supervision/safety   Upper Body Bathing: Minimal assistance;Sitting   Lower Body Bathing: Supervison/ safety;Sit to/from stand   Upper Body Dressing : Minimal assistance;Sitting   Lower Body Dressing: Supervision/safety;Sit to/from stand   Toilet Transfer: Supervision/safety;Rolling walker (2 wheels)           Functional mobility during ADLs: Supervision/safety;Rolling walker (2 wheels)       Vision Ability to See in Adequate Light: 0 Adequate Patient Visual Report: No change from baseline       Perception         Praxis         Pertinent Vitals/Pain Pain Assessment Pain Assessment: Faces Faces Pain Scale: Hurts little more Pain Location: R lower abdomem Pain Descriptors / Indicators: Discomfort, Grimacing, Guarding Pain Intervention(s): Monitored during session, Repositioned, Premedicated before session     Extremity/Trunk Assessment Upper Extremity Assessment Upper Extremity Assessment: Overall WFL for tasks assessed;Right hand dominant   Lower Extremity Assessment Lower Extremity Assessment: Defer to PT evaluation   Cervical / Trunk  Assessment Cervical / Trunk Assessment: Normal   Communication Communication Communication: No apparent difficulties   Cognition Arousal: Alert Behavior During Therapy: WFL for tasks assessed/performed Cognition: No apparent impairments                               Following commands: Intact       Cueing  General Comments           Exercises     Shoulder Instructions      Home Living Family/patient expects to be discharged to:: Private residence Living Arrangements: Alone Available Help at Discharge: Family;Available PRN/intermittently Type of Home: House Home Access: Stairs to enter Entrance Stairs-Number of Steps: 2   Home Layout: Multi-level Alternate Level Stairs-Number of Steps: 7 Alternate Level Stairs-Rails: Right;Left;Can reach both Bathroom Shower/Tub: Producer, television/film/video: Standard     Home Equipment: Pharmacist, hospital (2 wheels);Cane - quad;BSC/3in1   Additional Comments: works sitting with people      Prior Functioning/Environment Prior Level of Function : Independent/Modified Independent;Working/employed;Driving                    OT Problem List: Decreased activity tolerance;Impaired balance (sitting and/or standing);Decreased knowledge of use of DME or AE;Cardiopulmonary status limiting activity;Decreased knowledge of precautions;Pain   OT Treatment/Interventions: Self-care/ADL training;DME and/or AE instruction;Energy conservation;Therapeutic activities;Patient/family education;Balance training      OT Goals(Current goals can be found in the care plan section)   Acute Rehab OT Goals OT Goal Formulation: With patient Time For Goal Achievement: 03/01/24 Potential to Achieve Goals: Good ADL Goals Pt Will Perform Grooming: with modified independence;standing Pt Will Perform Lower Body Bathing: with modified independence;sit to/from stand Pt Will Perform Lower Body Dressing: with modified independence;sit to/from stand Pt Will Transfer to Toilet: with modified independence;ambulating;bedside commode Pt Will Perform Toileting - Clothing Manipulation and hygiene: with modified independence;sit to/from stand Additional ADL Goal #1: Pt will complete bed mobility mod I in preparation for ADLs. Additional ADL Goal #2: Pt will state at least 3 energy  conservation strategies as instructed.   OT Frequency:  Min 1X/week    Co-evaluation              AM-PAC OT "6 Clicks" Daily Activity     Outcome Measure Help from another person eating meals?: None Help from another person taking care of personal grooming?: A Little Help from another person toileting, which includes using toliet, bedpan, or urinal?: A Little Help from another person bathing (including washing, rinsing, drying)?: A Little Help from another person to put on and taking off regular upper body clothing?: A Little Help from another person to put on and taking off regular lower body clothing?: A Little 6 Click Score: 19   End of Session Equipment Utilized During Treatment: Rolling walker (2 wheels);Gait belt  Activity Tolerance: Patient tolerated treatment well Patient left: in bed;with call bell/phone within reach;with bed alarm set  OT Visit Diagnosis: Unsteadiness on feet (R26.81);Muscle weakness (generalized) (M62.81);Pain                Time: 1610-9604 OT Time Calculation (min): 22 min Charges:  OT General Charges $OT Visit: 1 Visit OT Evaluation $OT Eval Moderate Complexity: 1 Mod  Berna Spare, OTR/L Acute Rehabilitation Services Office: [phone]   Evern Bio 02/16/2024, 12:36 PM

## 2024-02-16 NOTE — Progress Notes (Signed)
 Patient brought to 4E from 2H. VSS. Telemetry box applied, CCMD notified. Patient oriented to room and staff. Call bell in reach. ? ?Kenard Gower, RN  ?

## 2024-02-16 NOTE — Plan of Care (Signed)
  Problem: Education: Goal: Ability to describe self-care measures that may prevent or decrease complications (Diabetes Survival Skills Education) will improve Outcome: Progressing   Problem: Skin Integrity: Goal: Wound healing without signs and symptoms of infection Outcome: Progressing Goal: Risk for impaired skin integrity will decrease Outcome: Progressing   Problem: Education: Goal: Will demonstrate proper wound care and an understanding of methods to prevent future damage Outcome: Progressing   Problem: Activity: Goal: Risk for activity intolerance will decrease Outcome: Progressing   Problem: Cardiac: Goal: Will achieve and/or maintain hemodynamic stability Outcome: Progressing   Problem: Clinical Measurements: Goal: Postoperative complications will be avoided or minimized Outcome: Progressing   Problem: Respiratory: Goal: Respiratory status will improve Outcome: Progressing

## 2024-02-16 NOTE — Evaluation (Signed)
 Physical Therapy Evaluation Patient Details Name: Meghan Lynn MRN: [id number] DOB: 03/08/1951 Today's Date: 02/16/2024  History of Present Illness  73 yo female admitted 2/10 with chest pain and NSTEMI. 2/12 LHC. 2/14 CABGx 3. 2/15 IABP removed. PMHx: CAD, HLD, HTN, esophageal stricture  Clinical Impression  PT pleasant, educated for sternal precautions, transfers and gait. Pt currently requiring supplemental O2 to maintain 88% and greater with mobility. Pt given post surgical book. PT lives alone with help of daughters as needed. Pt with decreased activity tolerance and gait who will benefit from acute therapy to maximize independence and safety  HR 92-99 92% on 2L at rest, 3L to maintain O2 with gait Pre gait105/44 (62) Post gait 129/76 (91)        If plan is discharge home, recommend the following: Assistance with cooking/housework;A little help with bathing/dressing/bathroom   Can travel by private vehicle        Equipment Recommendations None recommended by PT  Recommendations for Other Services       Functional Status Assessment Patient has had a recent decline in their functional status and demonstrates the ability to make significant improvements in function in a reasonable and predictable amount of time.     Precautions / Restrictions        Mobility  Bed Mobility Overal bed mobility: Needs Assistance Bed Mobility: Sit to Supine       Sit to supine: Min assist   General bed mobility comments: min assist to clear legs to surface, pt not tolerating supine and needing immediate HOB elevation for breathing    Transfers Overall transfer level: Needs assistance   Transfers: Sit to/from Stand Sit to Stand: Supervision           General transfer comment: cues for hand placement without physical assist to rise, cues for descent    Ambulation/Gait Ambulation/Gait assistance: Modified independent (Device/Increase time) Gait Distance (Feet): 325  Feet Assistive device: Rolling walker (2 wheels) Gait Pattern/deviations: Step-through pattern, Decreased stride length   Gait velocity interpretation: 1.31 - 2.62 ft/sec, indicative of limited community ambulator   General Gait Details: cues for breathing, pt self-regulating distance  Stairs Stairs: Yes Stairs assistance: Modified independent (Device/Increase time) Stair Management: One rail Left, Alternating pattern, Forwards Number of Stairs: 4    Wheelchair Mobility     Tilt Bed    Modified Rankin (Stroke Patients Only)       Balance Overall balance assessment: Mild deficits observed, not formally tested                                           Pertinent Vitals/Pain Pain Assessment Pain Assessment: No/denies pain    Home Living Family/patient expects to be discharged to:: Private residence Living Arrangements: Alone Available Help at Discharge: Family;Available PRN/intermittently Type of Home: House Home Access: Stairs to enter   Entrance Stairs-Number of Steps: 2 Alternate Level Stairs-Number of Steps: 7 Home Layout: Multi-level Home Equipment: Pharmacist, hospital (2 wheels);Cane - quad;BSC/3in1 Additional Comments: works sitting with people    Prior Function Prior Level of Function : Independent/Modified Independent;Working/employed;Driving                     Extremity/Trunk Assessment   Upper Extremity Assessment Upper Extremity Assessment: Overall WFL for tasks assessed    Lower Extremity Assessment Lower Extremity Assessment: Overall WFL for tasks assessed  Cervical / Trunk Assessment Cervical / Trunk Assessment: Normal  Communication   Communication Communication: No apparent difficulties    Cognition Arousal: Alert Behavior During Therapy: WFL for tasks assessed/performed   PT - Cognitive impairments: No apparent impairments                         Following commands: Intact        Cueing Cueing Techniques: Verbal cues     General Comments      Exercises     Assessment/Plan    PT Assessment Patient needs continued PT services  PT Problem List Decreased activity tolerance;Decreased knowledge of precautions;Decreased mobility       PT Treatment Interventions DME instruction;Gait training;Functional mobility training;Stair training;Patient/family education    PT Goals (Current goals can be found in the Care Plan section)  Acute Rehab PT Goals Patient Stated Goal: return home, work, shopping PT Goal Formulation: With patient Time For Goal Achievement: 02/23/24 Potential to Achieve Goals: Good    Frequency Min 1X/week     Co-evaluation               AM-PAC PT "6 Clicks" Mobility  Outcome Measure Help needed turning from your back to your side while in a flat bed without using bedrails?: A Little Help needed moving from lying on your back to sitting on the side of a flat bed without using bedrails?: A Little Help needed moving to and from a bed to a chair (including a wheelchair)?: A Little Help needed standing up from a chair using your arms (e.g., wheelchair or bedside chair)?: A Little Help needed to walk in hospital room?: A Little Help needed climbing 3-5 steps with a railing? : A Little 6 Click Score: 18    End of Session   Activity Tolerance: Patient tolerated treatment well Patient left: in bed;with call bell/phone within reach Nurse Communication: Mobility status PT Visit Diagnosis: Other abnormalities of gait and mobility (R26.89);Difficulty in walking, not elsewhere classified (R26.2)    Time: 1610-9604 PT Time Calculation (min) (ACUTE ONLY): 26 min   Charges:   PT Evaluation $PT Eval Moderate Complexity: 1 Mod PT Treatments $Therapeutic Activity: 8-22 mins PT General Charges $$ ACUTE PT VISIT: 1 Visit         Merryl Hacker, PT Acute Rehabilitation Services Office: [phone]   Cristine Polio 02/16/2024, 8:46 AM

## 2024-02-16 NOTE — TOC Progression Note (Signed)
 Transition of Care [facility]) - Progression Note    Patient Details  Name: Meghan Lynn MRN: [id number] Date of Birth: [birth year]  Transition of Care [facility]) CM/SW Contact  Elliot Cousin, RN Phone Number:  [phone] 02/16/2024, 12:57 PM  Clinical Narrative:    TOC CM spoke to pt and offered choice for [facility]. Pt requested [facility]. Contacted rep, Artavia with new referral. (Medicare.gov list placed on chart with rating). Pt will need oxygen for home. Contacted [employer] rep, Morrie Sheldon with new referral. Message sent to attending for [facility] orders and oxygen.    Expected Discharge Plan: Home w Home Health Services Barriers to Discharge: No Barriers Identified  Expected Discharge Plan and Services   Discharge Planning Services: CM Consult Post Acute Care Choice: Home Health Living arrangements for the past 2 months: Single Family Home                 DME Arranged: Oxygen DME Agency: Patsy Lager Date DME Agency Contacted: 02/16/24 Time DME Agency Contacted: [phone] Representative spoke with at DME Agency: Morrie Sheldon HH Arranged: RN, PT [facility] Agency: [facility] Date HH Agency Contacted: 02/16/24 Time HH Agency Contacted: [phone] Representative spoke with at [facility] Agency: Adele Dan   Social Determinants of Health (SDOH) Interventions SDOH Screenings   Food Insecurity: No Food Insecurity (02/07/2024)  Housing: Low Risk  (02/07/2024)  Transportation Needs: No Transportation Needs (02/07/2024)  Utilities: Not At Risk (02/07/2024)  Alcohol Screen: Low Risk  (04/14/2020)  Depression (PHQ2-9): Low Risk  (04/14/2020)  Social Connections: Moderately Isolated (02/07/2024)  Tobacco Use: High Risk (02/10/2024)    Readmission Risk Interventions     No data to display

## 2024-02-17 DIAGNOSIS — I214 Non-ST elevation (NSTEMI) myocardial infarction: Secondary | ICD-10-CM | POA: Diagnosis not present

## 2024-02-17 LAB — CBC
HCT: 32.4 % — ABNORMAL LOW (ref 36.0–46.0)
Hemoglobin: 10.4 g/dL — ABNORMAL LOW (ref 12.0–15.0)
MCH: 25.5 pg — ABNORMAL LOW (ref 26.0–34.0)
MCHC: 32.1 g/dL (ref 30.0–36.0)
MCV: 79.4 fL — ABNORMAL LOW (ref 80.0–100.0)
Platelets: 410 10*3/uL — ABNORMAL HIGH (ref 150–400)
RBC: 4.08 MIL/uL (ref 3.87–5.11)
RDW: 14 % (ref 11.5–15.5)
WBC: 15.6 10*3/uL — ABNORMAL HIGH (ref 4.0–10.5)
nRBC: 0 % (ref 0.0–0.2)

## 2024-02-17 LAB — BASIC METABOLIC PANEL
Anion gap: 14 (ref 5–15)
BUN: 13 mg/dL (ref 8–23)
CO2: 26 mmol/L (ref 22–32)
Calcium: 9 mg/dL (ref 8.9–10.3)
Chloride: 99 mmol/L (ref 98–111)
Creatinine, Ser: 1.08 mg/dL — ABNORMAL HIGH (ref 0.44–1.00)
GFR, Estimated: 54 mL/min — ABNORMAL LOW (ref >60)
Glucose, Bld: 125 mg/dL — ABNORMAL HIGH (ref 70–99)
Potassium: 3.9 mmol/L (ref 3.5–5.1)
Sodium: 139 mmol/L (ref 135–145)

## 2024-02-17 LAB — COOXEMETRY PANEL
Carboxyhemoglobin: 2.4 % — ABNORMAL HIGH (ref 0.5–1.5)
Methemoglobin: 1.1 % (ref 0.0–1.5)
O2 Saturation: 94.1 %
Total hemoglobin: 11.3 g/dL — ABNORMAL LOW (ref 12.0–16.0)

## 2024-02-17 NOTE — Plan of Care (Signed)
   Problem: Education: Goal: Knowledge of General Education information will improve Description: Including pain rating scale, medication(s)/side effects and non-pharmacologic comfort measures Outcome: Progressing   Problem: Clinical Measurements: Goal: Respiratory complications will improve Outcome: Progressing   Problem: Activity: Goal: Risk for activity intolerance will decrease Outcome: Progressing

## 2024-02-17 NOTE — Progress Notes (Signed)
 CARDIAC REHAB PHASE I   Pt resting in bed, feeling ok this morning. Agreeable to oob to chair. Pt moving well maintaining proper sternal precaution. Pt would like to walk in hallway little later, received suppository and worried about accident in hallway. OT arrived to work with pt. Will continue to follow.   [id number]   Woodroe Chen, RN BSN 02/17/2024 9:47 AM

## 2024-02-17 NOTE — Care Management Important Message (Signed)
 Important Message  Patient Details  Name: Meghan Lynn MRN: [id number] Date of Birth: 1951/06/24   Important Message Given:  Yes - Medicare IM     Dorena Bodo 02/17/2024, 3:04 PM

## 2024-02-17 NOTE — Progress Notes (Addendum)
 7 Days Post-Op Procedure(s) (LRB): CORONARY ARTERY BYPASS GRAFTING (CABG) TIMES THREE USING LEFT INTERNAL MAMMARY ARTERY AND ENDOSCOPICALLY HARVESTED RIGHT GREATER SAPHENOUS VEIN. (N/A) TRANSESOPHAGEAL ECHOCARDIOGRAM (TEE) (N/A) Subjective: Patient had a bad night due to being uncomfortable in bed. Patient was adjusted. Patient stated she was having pain around incision site but received medications shortly before. Patient mentioned she has been having watery stools and complains of lower abdominal pain. Patients appetite has been poor; ensure was ordered yesterday and patient was able to tolerate and drink 1/2.   Objective: Vital signs in last 24 hours: Temp:  [97.7 F (36.5 C)-98.3 F (36.8 C)] 98.1 F (36.7 C) (02/21 0321) Pulse Rate:  [83-91] 83 (02/21 0321) Cardiac Rhythm: Normal sinus rhythm (02/20 2100) Resp:  [15-27] 15 (02/21 0321) BP: (103-133)/(62-88) 118/78 (02/21 0321) SpO2:  [88 %-96 %] 92 % (02/21 0321) FiO2 (%):  [32 %] 32 % (02/20 0853) Weight:  [62.1 kg] 62.1 kg (02/21 0321)  Hemodynamic parameters for last 24 hours: CVP:  [8 mmHg] 8 mmHg  Intake/Output from previous day: 02/20 0701 - 02/21 0700 In: 60 [P.O.:60] Out: -  Intake/Output this shift: No intake/output data recorded.  General appearance: alert and no distress Neurologic: intact Heart: regular rate and rhythm, S1, S2 normal, no murmur, click, rub or gallop Lungs: clear to auscultation bilaterally Extremities: extremities normal, atraumatic, no cyanosis or edema Wound: incision site dry, no signs of infection noted.    Lab Results: Recent Labs    02/16/24 0544 02/17/24 0546  WBC 14.1* 15.6*  HGB 10.8* 10.4*  HCT 33.2* 32.4*  PLT 380 410*   BMET:  Recent Labs    02/16/24 0544 02/17/24 0546  NA 139 139  K 3.7 3.9  CL 99 99  CO2 29 26  GLUCOSE 130* 125*  BUN 17 13  CREATININE 1.23* 1.08*  CALCIUM 9.0 9.0    PT/INR: No results for input(s): "LABPROT", "INR" in the last 72  hours. ABG    Component Value Date/Time   PHART 7.382 02/11/2024 0319   HCO3 22.2 02/11/2024 0319   TCO2 23 02/11/2024 0319   ACIDBASEDEF 2.0 02/11/2024 0319   O2SAT 54.4 02/16/2024 0544   CBG (last 3)  Recent Labs    02/14/24 1223  GLUCAP 107*    Assessment/Plan: S/P Procedure(s) (LRB): CORONARY ARTERY BYPASS GRAFTING (CABG) TIMES THREE USING LEFT INTERNAL MAMMARY ARTERY AND ENDOSCOPICALLY HARVESTED RIGHT GREATER SAPHENOUS VEIN. (N/A) TRANSESOPHAGEAL ECHOCARDIOGRAM (TEE) (N/A)   Neuro: neurologically intact, no acute issues.  CV: NSR, BP remains stable continue current medication regimen.  Pulm: Patient remains SOB unable to wean from O2.  GI: oral intake poor continue ensure as tolerated. Patient is moving bowels but mostly water- will try suppository today.  Mobility: Patient didn't walk yesterday PT/OT eval, pt did well with not recommending home care.    LOS: 11 days    Haywood Lasso 02/17/2024   Patient had a bad night.  She states that she slept, but overall she was very uncomfortable in the bed.  She asked nursing to help with positioning, but it just wasn't quite right.  Her linens were half off the bed.  She had increased pain across her chest this morning and notes she has some lower abdominal pain which she had not had previously.  She did not ambulate yesterday, but had been previously without difficulty.  Her oral intake remains about the same.  However she did attempt to drink some ensure yesterday.  She has had several  BM since surgery, however they are mostly water.  Exam:  Gen: tearful, no acute distress Heart: RRR Lungs: mildly diminished left base Abd: soft, mild tenderness along lower portion of abdomen, minimal to hypoactive BS Ext; no edema present Incisions: healing well w/o s/s of infection   A/p:;  Overall patient is doing well.  Had a rough night, which has her a little discouraged today.  She remains in NSR.[name] Kitchen In regards to abdominal  pain.. she could have some mild degree of constipation.. She denies N/V.. will give a suppository today which can also assess if there is a stool burden present in the rectal vault... She has not been able to wean off oxygen and home use has been arranged.. If patient continues to do well I suspect she will be ready for d/c over the weekend.  Erin Barrett, PA-C 8:16 AM 02/17/24  Patient seen and examined.  Feels better now. Still some incisional pain. Continue cardiac rehab. Hopefully home in a couple of days  Viviann Spare C. Dorris Fetch, MD [facility] [phone]

## 2024-02-17 NOTE — Progress Notes (Addendum)
 Rounding Note    Patient Name: Meghan Lynn Date of Encounter: 02/17/2024  [facility] Cardiologist: Chrystie Nose, MD   Subjective   No significant overnight events. She is having some chest wall pain this morning and currently ranks it as a 6/10. She also describes getting short of breath with ambulating to the bathroom. However, she does not feel short of breath at rest. She is still on 3-4L of O2.  Inpatient Medications    Scheduled Meds:  aspirin EC  81 mg Oral Daily   bisacodyl  10 mg Oral Daily   Or   bisacodyl  10 mg Rectal Daily   carvedilol  3.125 mg Oral BID WC   Chlorhexidine Gluconate Cloth  6 each Topical Daily   clopidogrel  75 mg Oral Daily   dapagliflozin propanediol  10 mg Oral Daily   docusate sodium  200 mg Oral Daily   enoxaparin (LOVENOX) injection  40 mg Subcutaneous Q24H   Ensure  237 mL Oral TID WC   ezetimibe  10 mg Oral Daily   furosemide  40 mg Oral Daily   pantoprazole  40 mg Oral Daily   potassium chloride  20 mEq Oral Daily   sodium chloride flush  10-40 mL Intracatheter Q12H   spironolactone  12.5 mg Oral Daily   umeclidinium-vilanterol  1 puff Inhalation Daily   Continuous Infusions:  PRN Meds: albuterol, ALPRAZolam, metoprolol tartrate, ondansetron (ZOFRAN) IV, mouth rinse, oxyCODONE, sodium chloride flush, traMADol   Vital Signs    Vitals:   02/16/24 2037 02/17/24 0005 02/17/24 0321 02/17/24 0809  BP: 119/70 130/81 118/78 134/80  Pulse: 84 83 83 88  Resp: 19 19 15 20   Temp: 98.3 F (36.8 C) 98.2 F (36.8 C) 98.1 F (36.7 C) 97.7 F (36.5 C)  TempSrc: Oral Oral Oral Oral  SpO2: 94% 96% 92% 99%  Weight:   62.1 kg   Height:       No intake or output data in the 24 hours ending 02/17/24 1029    02/17/2024    3:21 AM 02/16/2024    5:00 AM 02/15/2024    5:00 AM  Last 3 Weights  Weight (lbs) 136 lb 14.5 oz 136 lb 3.9 oz 138 lb 9.6 oz  Weight (kg) 62.1 kg 61.8 kg 62.869 kg      Telemetry    Normal sinus  rhythm with rates in the 80s. - Personally Reviewed  ECG    No new ECG tracing today. - Personally Reviewed  Physical Exam   GEN: No acute distress.   Neck: No JVD. Cardiac: RRR. No murmurs, rubs, or gallops.  Respiratory: Clear to auscultation bilaterally. No wheezes, rhonchi, or rales. MS: No lower extremity edema. No deformity. Neuro:  No focal deficits. Psych: Normal affect. Responds appropriately.  Labs    High Sensitivity Troponin:   Recent Labs  Lab 02/02/24 0057 02/06/24 1052 02/06/24 1259 02/07/24 0548 02/08/24 0504  TROPONINIHS 4 196* 181* 762* 595*     Chemistry Recent Labs  Lab 02/11/24 1547 02/12/24 0322 02/13/24 2033 02/14/24 0505 02/15/24 0521 02/16/24 0544 02/17/24 0546  NA 136   < > 137 136 138 139 139  K 4.3   < > 4.1 3.9 4.0 3.7 3.9  CL 103   < > 99 102 102 99 99  CO2 20*   < > 27 25 27 29 26   GLUCOSE 137*   < > 95 93 112* 130* 125*  BUN 11   < >  19 18 19 17 13   CREATININE 1.32*   < > 1.19* 1.36* 1.40* 1.23* 1.08*  CALCIUM 8.4*   < > 8.9 8.7* 9.0 9.0 9.0  MG 2.5*  --  2.2 2.0  --   --   --   GFRNONAA 43*   < > 48* 41* 40* 46* 54*  ANIONGAP 13   < > 11 9 9 11 14    < > = values in this interval not displayed.    Lipids No results for input(s): "CHOL", "TRIG", "HDL", "LABVLDL", "LDLCALC", "CHOLHDL" in the last 168 hours.  Hematology Recent Labs  Lab 02/15/24 0521 02/16/24 0544 02/17/24 0546  WBC 16.3* 14.1* 15.6*  RBC 3.93 4.15 4.08  HGB 10.3* 10.8* 10.4*  HCT 31.4* 33.2* 32.4*  MCV 79.9* 80.0 79.4*  MCH 26.2 26.0 25.5*  MCHC 32.8 32.5 32.1  RDW 13.8 13.9 14.0  PLT 309 380 410*   Thyroid No results for input(s): "TSH", "FREET4" in the last 168 hours.  BNPNo results for input(s): "BNP", "PROBNP" in the last 168 hours.  DDimer No results for input(s): "DDIMER" in the last 168 hours.   Radiology    DG Chest 2 View Result Date: 02/16/2024 CLINICAL DATA:  [id number]. Status post CABG 02/10/2024.  Shortness of breath. EXAM: CHEST - 2  VIEW COMPARISON:  Portable chest yesterday at 8:14 a.m. FINDINGS: 5:07 a.m. right PICC tip remains at the superior cavoatrial junction. Small layering pleural effusions are unchanged. There is cardiomegaly with stable CABG changes, stable mediastinum with aortic tortuosity and calcification. There is mild perihilar vascular congestion with near resolution of interstitial edema, minimal edema remaining in the bases. No new osseous findings. Osteopenia and degenerative change of the spine. IMPRESSION: 1. Near resolution of interstitial edema, minimal edema remaining in the bases. 2. Stable small layering pleural effusions. 3. Stable cardiomegaly and CABG changes. Improved central vascular prominence. 4. Aortic atherosclerosis. Electronically Signed   By: Almira Bar M.D.   On: 02/16/2024 06:35    Cardiac Studies   Echocardiogram 02/08/2024: Impressions:  1. Left ventricular ejection fraction, by estimation, is 45 to 50%. Left  ventricular ejection fraction by 2D MOD biplane is 49.4 %. The left  ventricle has mildly decreased function. The left ventricle demonstrates  regional wall motion abnormalities (see   scoring diagram/findings for description). Left ventricular diastolic  parameters are consistent with Grade I diastolic dysfunction (impaired  relaxation). There is severe hypokinesis of the left ventricular,  mid-apical anteroseptal wall, apical segment  and anterior wall. The base of the heart is hyperdynamic. Consider LAD  territory disease vs. Takatsubo cardiomyopathy.   2. Right ventricular systolic function is normal. The right ventricular  size is normal.   3. The mitral valve is grossly normal. Trivial mitral valve  regurgitation.   4. The aortic valve is tricuspid. Aortic valve regurgitation is not  visualized. No aortic stenosis is present.   Comparison(s): No prior Echocardiogram.   Conclusion(s)/Recommendation(s): Findings suggest LAD territory ischemia  -probably  mid-distal disease vs possibly Takatsubo cardiomyopathy.  _______________  Left Cardiac Catheterization 02/08/2024:   Prox RCA to Dist RCA lesion is 100% stenosed.   Dist LAD lesion is 99% stenosed.   Prox Cx to Mid Cx lesion is 80% stenosed.   Mid Cx to Dist Cx lesion is 70% stenosed.   Dist Cx lesion is 90% stenosed.   1.  Severe multivessel disease with TIMI II flow in the distal LAD.  Due to ongoing chest pain, an intra-aortic balloon  pump was placed and the patient was rendered chest pain-free. 2.  LVEDP of 13 mmHg.   Recommendation: The case was discussed with Dr. Shirlee Latch who will assume care.  The patient will be transferred to the 2 heart unit and a cardiothoracic surgical consult will be obtained.  Diagnostic Dominance: Right      Patient Profile     73 y.o. female with a history of CAD s/p DES x3 to RCA in 08/2013, hypertension, hyperlipidemia, COPD/ asthma, esophageal stricture, and tobacco abuse who was admitted on 02/06/2024 for a NSTEMI after presenting with 2 weeks of intermittent chest/ epispastic pain. LHC showed severe multivessel. Intra-aortic balloon pump was placed due to ongoing pain. CT Surgery was consulted and she ultimately underwent CABG x3 on 02/10/2024.  Assessment & Plan    NSTEMI CAD Patient has a history of CAD with prior DES x3 to RCA in 2014. She presented with intermittent chest/ epigastric pain and ruled in for NSTEMI. High-sensitivity troponin peaked at 762. Echo showed LVEF of 45-50% with severe hypokinesis of the mid-apical anteroseptal wall, apical segment, and anterior wall. LHC on 2/12 showed severe multivessel CAD with 100% in-stent restenosis of proximal to distal RCA, 99% stenosis of distal LAD, and 80% stenosis of proximal to mid LCX followed by 70% stenosis of mid to distal vessel and then 90% stenosis of distal vessel. Intra-aortic balloon pump was placed at time of cath due to ongoing chest pain. CT surgery was consulted and patient underwent  CABG x 3 with LIMA to LAD, SVG to OM, and SVG to PDA on 2/14. Balloon pump was able to be removed on 2/15. - She is having some chest wall pain this morning. - Continue DAPT with Aspirin and Plavix. - Continue Coreg 3.125mg  twice daily. - Unable to tolerate statins. Continue Zetia 10mg  daily. Recommend outpatient referral to [facility] for consideration of PCSK9 inhibitor.  Ischemic Cardiomyopathy HFmrEF Echo this admission showed LVEF of 45-50% with wall motion abnormalities as noted above. Initially diuresed with IV Lasix but has now been switched to PO Lasix. Net positive 1.3 L this admission. Weight down 9 lbs from admission. Renal function stable. - She is still requiring supplemental O2 and still reports dyspnea on exertion. However, she looks euvolemic on exam. - She still has a PICC line in place so will recheck CO-OX and ask for CVP to be checked. - Continue Lasix 40mg  daily. - Continue Coreg 3.125mg  twice daily. - Continue Spironolactone 12.5mg  daily. - Continue Farxiga 10mg  daily. - Can add Losartan 25mg  daily if renal function continues to remain stable (she was previously on Valsartan 320mg  daily at home). - Continue to monitor daily weights, strict I/Os, and renal function.  Hypertension BP well controlled. - Continue medications for CHF as above.  Hyperlipidemia Lipid panel this admission: Total Cholesterol 181, Triglycerides 137, HDL 50, LDL 104. LDL goal <55. - Unable to tolerate statins.  - Continue Zetia 10mg  daily.  - Recommend outpatient referral to [facility] for consideration of PCSK9 inhibitor.  AKI  Baseline creatinine around 0.9. Peaked at 1.71 on 2/16. - Creatinine improving - 1.08 today.  - Continue to monitor closely.  Otherwise, per primary team: - COPD - Abdominal pain - Dysphagia - History of esophageal stricture  For questions or updates, please contact [facility] Please consult [email or web address] for contact info under         Signed, Corrin Parker, PA-C  02/17/2024, 10:29 AM     Attending Note:  The patient was seen and examined.  Agree with assessment and plan as noted above.  Changes made to the above note as needed.  Patient seen and independently examined with Marjie Skiff, PA .   We discussed all aspects of the encounter. I agree with the assessment and plan as stated above.   1.  Coronary artery disease: This patient is status post CABG.  She overall seems to be doing fairly well.  She is having some chest wall incisional pain but this is normal.  2.  Ischemic cardiomyopathy: She has a mildly reduced LVEF of 45 to 50%.  She received some IV Lasix but is now on oral furosemide. Continue Lasix 40 mg a day, spironolactone 12.5 mg a day, carvedilol 3.25 mg twice a day, Farxiga 10 mg a day  3.   HTN:   BP was low overnight.  Is mildly elevated this afternoon.   May be due to chest wall pain .   She was on valsartan 320 mg a day at home  Consider adding back low dose Losartan or Irbesartan if her bp remains elevated.   She seems to be making progress     I have spent a total of 40 minutes with patient reviewing hospital  notes , telemetry, EKGs, labs and examining patient as well as establishing an assessment and plan that was discussed with the patient.  > 50% of time was spent in direct patient care.    Vesta Mixer, Montez Hageman., MD, [facility] 02/17/2024, 2:19 PM [address],  [address] Office [phone] Pager [phone]

## 2024-02-17 NOTE — Progress Notes (Signed)
 Occupational Therapy Treatment Patient Details Name: Meghan Lynn MRN: [id number] DOB: Nov 26, 1951 Today's Date: 02/17/2024   History of present illness 73 yo female admitted 2/10 with chest pain and NSTEMI. 2/12 LHC. 2/14 CABGx 3. 2/15 IABP removed. PMHx: CAD, HLD, HTN, esophageal stricture   OT comments  Pt had just received suppository and reluctant to mobilize out of room. Pt generalizing sternal precautions with mobility and completing seated grooming with set up. Educated pt in energy conservation strategies and provided written handout. Pt needing 4L O2 to maintain sats at 92%. Visibly short of breath.       If plan is discharge home, recommend the following:  Assistance with cooking/housework;A little help with bathing/dressing/bathroom;Assist for transportation;Help with stairs or ramp for entrance   Equipment Recommendations  None recommended by OT    Recommendations for Other Services      Precautions / Restrictions Precautions Precautions: Sternal;Fall Recall of Precautions/Restrictions: Intact Precaution/Restrictions Comments: pt adhering to sternal precautions with mobility Restrictions Weight Bearing Restrictions Per Provider Order: No       Mobility Bed Mobility               General bed mobility comments: in chair    Transfers Overall transfer level: Needs assistance Equipment used: Rolling walker (2 wheels) Transfers: Sit to/from Stand Sit to Stand: Supervision           General transfer comment: good adherence to sternal precautions     Balance Overall balance assessment: Mild deficits observed, not formally tested                                         ADL either performed or assessed with clinical judgement   ADL Overall ADL's : Needs assistance/impaired     Grooming: Wash/dry hands;Wash/dry face;Sitting;Set up                   Toilet Transfer: Supervision/safety;Rolling walker (2 wheels);BSC/3in1              General ADL Comments: Educated in the 4 Ps of energy conservation.    Extremity/Trunk Assessment              Vision       Restaurant manager, fast food Communication: No apparent difficulties   Cognition Arousal: Alert Behavior During Therapy: WFL for tasks assessed/performed Cognition: No apparent impairments                                        Cueing      Exercises      Shoulder Instructions       General Comments      Pertinent Vitals/ Pain       Pain Assessment Faces Pain Scale: Hurts little more Pain Location: R lower abdomem Pain Descriptors / Indicators: Discomfort, Grimacing, Guarding Pain Intervention(s): Repositioned  Home Living                                          Prior Functioning/Environment              Frequency  Min 1X/week        Progress  Toward Goals  OT Goals(current goals can now be found in the care plan section)  Progress towards OT goals: Progressing toward goals  Acute Rehab OT Goals OT Goal Formulation: With patient Time For Goal Achievement: 03/01/24 Potential to Achieve Goals: Good  Plan      Co-evaluation                 AM-PAC OT "6 Clicks" Daily Activity     Outcome Measure   Help from another person eating meals?: None Help from another person taking care of personal grooming?: A Little Help from another person toileting, which includes using toliet, bedpan, or urinal?: A Little Help from another person bathing (including washing, rinsing, drying)?: A Little Help from another person to put on and taking off regular upper body clothing?: A Little Help from another person to put on and taking off regular lower body clothing?: A Little 6 Click Score: 19    End of Session Equipment Utilized During Treatment: Rolling walker (2 wheels);Gait belt  OT Visit Diagnosis: Unsteadiness on feet (R26.81);Muscle weakness  (generalized) (M62.81);Pain (decreased activity tolreance)   Activity Tolerance Other (comment) (pt preferring to stay near her [facility] as she just had a suppository)   Patient Left in chair;with call bell/phone within reach   Nurse Communication          Time: [phone] OT Time Calculation (min): 15 min  Charges: OT General Charges $OT Visit: 1 Visit OT Treatments $Self Care/Home Management : 8-22 mins  Evern Bio 02/17/2024, 10:37 AM Berna Spare, OTR/L Acute Rehabilitation Services Office: [phone]

## 2024-02-18 DIAGNOSIS — I214 Non-ST elevation (NSTEMI) myocardial infarction: Secondary | ICD-10-CM | POA: Diagnosis not present

## 2024-02-18 LAB — GLUCOSE, CAPILLARY: Glucose-Capillary: 165 mg/dL — ABNORMAL HIGH (ref 70–99)

## 2024-02-18 MED ORDER — FUROSEMIDE 10 MG/ML IJ SOLN
20.0000 mg | Freq: Once | INTRAMUSCULAR | Status: AC
  Administered 2024-02-18: 20 mg via INTRAVENOUS
  Filled 2024-02-18: qty 2

## 2024-02-18 MED ORDER — KETOROLAC TROMETHAMINE 15 MG/ML IJ SOLN
15.0000 mg | Freq: Once | INTRAMUSCULAR | Status: AC
Start: 2024-02-18 — End: 2024-02-18
  Administered 2024-02-18: 15 mg via INTRAVENOUS
  Filled 2024-02-18: qty 1

## 2024-02-18 MED ORDER — LACTULOSE 10 GM/15ML PO SOLN
20.0000 g | Freq: Once | ORAL | Status: AC
  Administered 2024-02-18: 20 g via ORAL
  Filled 2024-02-18: qty 30

## 2024-02-18 NOTE — Plan of Care (Signed)
   Problem: Coping: Goal: Ability to adjust to condition or change in health will improve Outcome: Progressing

## 2024-02-18 NOTE — Progress Notes (Signed)
 [address]       [place] [address]             [phone]      8 Days Post-Op Procedure(s) (LRB): CORONARY ARTERY BYPASS GRAFTING (CABG) TIMES THREE USING LEFT INTERNAL MAMMARY ARTERY AND ENDOSCOPICALLY HARVESTED RIGHT GREATER SAPHENOUS VEIN. (N/A) TRANSESOPHAGEAL ECHOCARDIOGRAM (TEE) (N/A) Subjective: Sitting up in the bedside chair.  Said she was awakened last night with abdominal discomfort that improved after she got up and walked around.  Denies abdominal pain now.  Said she has had small watery bowel movements each day but has not had a "normal" bowel movement in several days.  Objective: Vital signs in last 24 hours: Temp:  [98 F (36.7 C)-98.5 F (36.9 C)] 98.2 F (36.8 C) (02/22 0800) Pulse Rate:  [81-96] 91 (02/22 0800) Cardiac Rhythm: Normal sinus rhythm (02/21 2100) Resp:  [17-21] 21 (02/22 0800) BP: (116-167)/(64-100) 116/64 (02/22 0800) SpO2:  [91 %-98 %] 98 % (02/22 0800) Weight:  [64.1 kg] 64.1 kg (02/22 0441)  Hemodynamic parameters for last 24 hours: CVP:  [7 mmHg] 7 mmHg  Intake/Output from previous day: 02/21 0701 - 02/22 0700 In: 120 [P.O.:120] Out: -  Intake/Output this shift: No intake/output data recorded.  General appearance: alert, cooperative, and mild distress Neurologic: intact Heart: Regular rate and rhythm, monitor shows normal sinus rhythm with no significant arrhythmias. Lungs: Breath sounds full and equal and clear to auscultation.  Normal work of breathing and O2 sat 98% on 4 L nasal cannula Abdomen: Mild discomfort with palpation but no obvious tenderness.  Bowel sounds are active and she is not distended Extremities: The right lower extremity EVH incision is intact and dry.  No peripheral edema Wound: Sternal incision is dry  Lab Results: Recent Labs    02/16/24 0544 02/17/24 0546  WBC 14.1* 15.6*  HGB 10.8* 10.4*  HCT 33.2* 32.4*  PLT 380 410*   BMET:  Recent Labs    02/16/24 0544 02/17/24 0546   NA 139 139  K 3.7 3.9  CL 99 99  CO2 29 26  GLUCOSE 130* 125*  BUN 17 13  CREATININE 1.23* 1.08*  CALCIUM 9.0 9.0    PT/INR: No results for input(s): "LABPROT", "INR" in the last 72 hours. ABG    Component Value Date/Time   PHART 7.382 02/11/2024 0319   HCO3 22.2 02/11/2024 0319   TCO2 23 02/11/2024 0319   ACIDBASEDEF 2.0 02/11/2024 0319   O2SAT 94.1 02/17/2024 1248   CBG (last 3)  No results for input(s): "GLUCAP" in the last 72 hours.  Assessment/Plan: S/P Procedure(s) (LRB): CORONARY ARTERY BYPASS GRAFTING (CABG) TIMES THREE USING LEFT INTERNAL MAMMARY ARTERY AND ENDOSCOPICALLY HARVESTED RIGHT GREATER SAPHENOUS VEIN. (N/A) TRANSESOPHAGEAL ECHOCARDIOGRAM (TEE) (N/A)  -Postop day 8 CABG x 3 after presenting with non-ST elevation myocardial infarction.  She has known longstanding coronary artery disease and is status post multiple coronary PCI's 10 years ago.  Preop EF about 45%.  Followed by the advanced heart failure team for ischemic cardiomyopathy.  Currently on aspirin, carvedilol, Zetia (statin intolerance), spironolactone, and Farxiga.  Cardiac rhythm stable.  MAP 90-100, would probably tolerate addition of ARB asked by advanced heart failure.  -GI: History of dysphagia.  Evaluated by gastroenterology [facility] with barium swallow showing no stricture.  Tolerating p.o.'s but has poor appetite and no "normal" BM for several days.  Continue nutrition supplements, Protonix, Colace.  Will give lactulose x 1 today.  -Pulm: History of  COPD, active smoker immediately prior to admission.  Last chest x-ray 2 days ago showed improvement in pulmonary vascular congestion and stable left-sided effusion/atelectasis.  Continue working on pulmonary hygiene.  Continue inhalers as ordered.  Continue Lasix 40 mg daily for now.  Follow-up chest x-ray tomorrow.  Home oxygen has been arranged.  -Renal: Normal function at baseline.  Has had mild AKI after surgery with creatinine trending back down  toward normal.  Weight is ongoing 0.5 kg above preop.  Monitor  -Disposition: Anticipate eventual discharge to home 1-2 days with home health nursing and home oxygen therapy.    LOS: 12 days    Leary Roca, PA-C 02/18/2024

## 2024-02-18 NOTE — Progress Notes (Signed)
 Pt walked this morning with EVA walker, she would be interested in rolaltor for home if covered.  Pt received OHS book and education on restrictions, heart healthy diet, ex guidelines, Move in the Tube sheet, incentive spirometer use when d/c and CRPII. Pt denies questions and was encouraged to look in the book for additional information. Referral placed to [facility].   Faustino Congress MS, ACSM-CEP 02/18/2024 10:14 AM  [phone]

## 2024-02-18 NOTE — Progress Notes (Signed)
 Progress Note  Patient Name: Meghan Lynn Date of Encounter: 02/18/2024  Primary Cardiologist: Chrystie Nose, MD   Subjective   Still with pleuritic chest pain and sob with activity.  Inpatient Medications    Scheduled Meds:  aspirin EC  81 mg Oral Daily   bisacodyl  10 mg Oral Daily   Or   bisacodyl  10 mg Rectal Daily   carvedilol  3.125 mg Oral BID WC   Chlorhexidine Gluconate Cloth  6 each Topical Daily   clopidogrel  75 mg Oral Daily   dapagliflozin propanediol  10 mg Oral Daily   docusate sodium  200 mg Oral Daily   enoxaparin (LOVENOX) injection  40 mg Subcutaneous Q24H   ezetimibe  10 mg Oral Daily   feeding supplement  237 mL Oral TID WC   furosemide  40 mg Oral Daily   lactulose  20 g Oral Once   pantoprazole  40 mg Oral Daily   potassium chloride  20 mEq Oral Daily   sodium chloride flush  10-40 mL Intracatheter Q12H   spironolactone  12.5 mg Oral Daily   umeclidinium-vilanterol  1 puff Inhalation Daily   Continuous Infusions:  PRN Meds: albuterol, ALPRAZolam, metoprolol tartrate, ondansetron (ZOFRAN) IV, mouth rinse, oxyCODONE, sodium chloride flush, traMADol   Vital Signs    Vitals:   02/17/24 2215 02/18/24 0315 02/18/24 0441 02/18/24 0800  BP: (!) 148/82 129/75  116/64  Pulse: 81 89 88 91  Resp: 20 20 17  (!) 21  Temp: 98.5 F (36.9 C) 98.4 F (36.9 C)  98.2 F (36.8 C)  TempSrc: Oral Oral  Oral  SpO2: 93% 95% 92% 98%  Weight:   64.1 kg   Height:        Intake/Output Summary (Last 24 hours) at 02/18/2024 1011 Last data filed at 02/17/2024 2100 Gross per 24 hour  Intake 120 ml  Output --  Net 120 ml   Filed Weights   02/16/24 0500 02/17/24 0321 02/18/24 0441  Weight: 61.8 kg 62.1 kg 64.1 kg    Telemetry    NSR at 90/min - Personally Reviewed  ECG    none - Personally Reviewed  Physical Exam   GEN: No acute distress.   Neck: No JVD Cardiac: RRR, no murmurs, rubs, or gallops.  Respiratory: egophony in the bases GI:  Soft, nontender, non-distended  MS: No edema; No deformity. Neuro:  Nonfocal  Psych: Normal affect   Labs    Chemistry Recent Labs  Lab 02/15/24 0521 02/16/24 0544 02/17/24 0546  NA 138 139 139  K 4.0 3.7 3.9  CL 102 99 99  CO2 27 29 26   GLUCOSE 112* 130* 125*  BUN 19 17 13   CREATININE 1.40* 1.23* 1.08*  CALCIUM 9.0 9.0 9.0  GFRNONAA 40* 46* 54*  ANIONGAP 9 11 14      Hematology Recent Labs  Lab 02/15/24 0521 02/16/24 0544 02/17/24 0546  WBC 16.3* 14.1* 15.6*  RBC 3.93 4.15 4.08  HGB 10.3* 10.8* 10.4*  HCT 31.4* 33.2* 32.4*  MCV 79.9* 80.0 79.4*  MCH 26.2 26.0 25.5*  MCHC 32.8 32.5 32.1  RDW 13.8 13.9 14.0  PLT 309 380 410*    Cardiac EnzymesNo results for input(s): "TROPONINI" in the last 168 hours. No results for input(s): "TROPIPOC" in the last 168 hours.   BNPNo results for input(s): "BNP", "PROBNP" in the last 168 hours.   DDimer No results for input(s): "DDIMER" in the last 168 hours.   Radiology  No results found.  Cardiac Studies   none  Patient Profile     73 y.o. female admitted with pleuritic chest pain and sob after CABG  Assessment & Plan    Chest pain - still present. Non-cardiac. I will order a single dose of IV toradol. Dyspnea - she was given IV lasix and now PO. Her weight is up 2 Kg. I will give additional lasix.  HTN - her bp is normal today.  For questions or updates, please contact [facility] Please consult [email or web address] for contact info under Cardiology/STEMI.      Signed, Lewayne Bunting, MD  02/18/2024, 10:11 AM

## 2024-02-19 ENCOUNTER — Inpatient Hospital Stay (HOSPITAL_COMMUNITY): Payer: Medicare HMO

## 2024-02-19 DIAGNOSIS — I214 Non-ST elevation (NSTEMI) myocardial infarction: Secondary | ICD-10-CM | POA: Diagnosis not present

## 2024-02-19 MED ORDER — KETOROLAC TROMETHAMINE 30 MG/ML IJ SOLN
30.0000 mg | Freq: Once | INTRAMUSCULAR | Status: AC
Start: 2024-02-19 — End: 2024-02-19
  Administered 2024-02-19: 30 mg via INTRAVENOUS
  Filled 2024-02-19: qty 1

## 2024-02-19 NOTE — Progress Notes (Addendum)
 [address]       [place] [address]             [phone]      9 Days Post-Op Procedure(s) (LRB): CORONARY ARTERY BYPASS GRAFTING (CABG) TIMES THREE USING LEFT INTERNAL MAMMARY ARTERY AND ENDOSCOPICALLY HARVESTED RIGHT GREATER SAPHENOUS VEIN. (N/A) TRANSESOPHAGEAL ECHOCARDIOGRAM (TEE) (N/A) Subjective: Resting in bed, awake and alert. Said she had another "episode" of shortness of breath last evening around bedtime that "eased off" by morning. Said she is still having a lot of sternal soreness.  O2 at 4L/, sat 97% BM x 2 yesterday   Objective: Vital signs in last 24 hours: Temp:  [97.6 F (36.4 C)-98.4 F (36.9 C)] 98.1 F (36.7 C) (02/23 0800) Pulse Rate:  [72-92] 83 (02/23 0800) Cardiac Rhythm: Normal sinus rhythm (02/22 2001) Resp:  [19-24] 20 (02/23 0800) BP: (116-145)/(62-84) 116/76 (02/23 0800) SpO2:  [93 %-99 %] 97 % (02/23 0800) Weight:  [60.1 kg] 60.1 kg (02/23 0452)   Intake/Output from previous day: 02/22 0701 - 02/23 0700 In: 120 [P.O.:120] Out: -  Intake/Output this shift: No intake/output data recorded.  General appearance: alert, cooperative, and mild distress Neurologic: intact Heart: Regular rate and rhythm, monitor shows normal sinus rhythm with no significant arrhythmias. Lungs: Breath sounds full and equal and clear to auscultation.  Normal work of breathing and O2 sat 97% on 4 L nasal cannula Abdomen: Mild discomfort with palpation but no obvious tenderness.  Bowel sounds are active and she is not distended Extremities: The right lower extremity EVH incision is intact and dry.  No peripheral edema Wound: Sternal incision is dry with no signs of complication but remains tender to light palpation. No obvious instability with cough.   Lab Results: Recent Labs    02/17/24 0546  WBC 15.6*  HGB 10.4*  HCT 32.4*  PLT 410*   BMET:  Recent Labs    02/17/24 0546  NA 139  K 3.9  CL 99  CO2 26  GLUCOSE 125*  BUN 13   CREATININE 1.08*  CALCIUM 9.0    PT/INR: No results for input(s): "LABPROT", "INR" in the last 72 hours. ABG    Component Value Date/Time   PHART 7.382 02/11/2024 0319   HCO3 22.2 02/11/2024 0319   TCO2 23 02/11/2024 0319   ACIDBASEDEF 2.0 02/11/2024 0319   O2SAT 94.1 02/17/2024 1248   CBG (last 3)  Recent Labs    02/18/24 2153  GLUCAP 165*    Assessment/Plan: S/P Procedure(s) (LRB): CORONARY ARTERY BYPASS GRAFTING (CABG) TIMES THREE USING LEFT INTERNAL MAMMARY ARTERY AND ENDOSCOPICALLY HARVESTED RIGHT GREATER SAPHENOUS VEIN. (N/A) TRANSESOPHAGEAL ECHOCARDIOGRAM (TEE) (N/A)  -Postop day 9 CABG x 3 after presenting with non-ST elevation myocardial infarction.  She has known longstanding coronary artery disease and is status post multiple coronary PCI's 10 years ago.  Preop EF about 45%.  Followed by the advanced heart failure team for ischemic cardiomyopathy.  Currently on aspirin, carvedilol, Zetia (statin intolerance), spironolactone, and Farxiga.  Cardiac rhythm stable.   -GI: History of dysphagia.  Evaluated by gastroenterology  with barium swallow showing no stricture.  Tolerating p.o.'s but has poor appetite.  BM x 2 yesterday after lactulose.  Continue nutrition supplements, Protonix, Colace.    -Pulm: History of COPD, active smoker immediately prior to admission.  Continue to have shortness of breath with minimal activity. Last chest x-ray 2 days ago showed improvement in pulmonary vascular congestion and stable left-sided effusion/atelectasis.  Discussed with Dr. Leafy Ro, will get non-contrast chest CT today.  Continue inhalers as ordered.  Home oxygen has been arranged.   -Renal: Normal function at baseline.  Has had mild AKI after surgery with creatinine trending back down toward normal.  Weight is down 4kg from yesterday--doubt this is accurate.  Lasix has been stopped.  -Disposition: Planning eventual discharge to home with home health nursing and home oxygen therapy.     LOS: 13 days    Leary Roca, PA-C 02/19/2024

## 2024-02-19 NOTE — Progress Notes (Signed)
 Progress Note  Patient Name: Meghan Lynn Date of Encounter: 02/19/2024  Primary Cardiologist: Chrystie Nose, MD   Subjective   Remains tearful/anxious/c/o pleuritic chest pain.   Inpatient Medications    Scheduled Meds:  aspirin EC  81 mg Oral Daily   bisacodyl  10 mg Oral Daily   Or   bisacodyl  10 mg Rectal Daily   carvedilol  3.125 mg Oral BID WC   Chlorhexidine Gluconate Cloth  6 each Topical Daily   clopidogrel  75 mg Oral Daily   dapagliflozin propanediol  10 mg Oral Daily   docusate sodium  200 mg Oral Daily   enoxaparin (LOVENOX) injection  40 mg Subcutaneous Q24H   ezetimibe  10 mg Oral Daily   feeding supplement  237 mL Oral TID WC   pantoprazole  40 mg Oral Daily   potassium chloride  20 mEq Oral Daily   sodium chloride flush  10-40 mL Intracatheter Q12H   spironolactone  12.5 mg Oral Daily   umeclidinium-vilanterol  1 puff Inhalation Daily   Continuous Infusions:  PRN Meds: albuterol, ALPRAZolam, metoprolol tartrate, ondansetron (ZOFRAN) IV, mouth rinse, oxyCODONE, sodium chloride flush, traMADol   Vital Signs    Vitals:   02/18/24 2310 02/19/24 0325 02/19/24 0452 02/19/24 0800  BP: 116/69 131/74  116/76  Pulse: 84 82 92 83  Resp: 20 20 (!) 24 20  Temp: 98.3 F (36.8 C) 98.4 F (36.9 C)  98.1 F (36.7 C)  TempSrc: Oral Oral  Oral  SpO2: 96% 97% 93% 97%  Weight:   60.1 kg   Height:        Intake/Output Summary (Last 24 hours) at 02/19/2024 0919 Last data filed at 02/18/2024 1945 Gross per 24 hour  Intake 120 ml  Output --  Net 120 ml   Filed Weights   02/17/24 0321 02/18/24 0441 02/19/24 0452  Weight: 62.1 kg 64.1 kg 60.1 kg    Telemetry    Nsr 85/min - Personally Reviewed  ECG    none - Personally Reviewed  Physical Exam   GEN: No acute distress.   Neck: No JVD Cardiac: RRR, no murmurs, rubs, or gallops.  Respiratory: scattered rales GI: Soft, nontender, non-distended  MS: No edema; No deformity. Neuro:  Nonfocal   Psych: Normal affect   Labs    Chemistry Recent Labs  Lab 02/15/24 0521 02/16/24 0544 02/17/24 0546  NA 138 139 139  K 4.0 3.7 3.9  CL 102 99 99  CO2 27 29 26   GLUCOSE 112* 130* 125*  BUN 19 17 13   CREATININE 1.40* 1.23* 1.08*  CALCIUM 9.0 9.0 9.0  GFRNONAA 40* 46* 54*  ANIONGAP 9 11 14      Hematology Recent Labs  Lab 02/15/24 0521 02/16/24 0544 02/17/24 0546  WBC 16.3* 14.1* 15.6*  RBC 3.93 4.15 4.08  HGB 10.3* 10.8* 10.4*  HCT 31.4* 33.2* 32.4*  MCV 79.9* 80.0 79.4*  MCH 26.2 26.0 25.5*  MCHC 32.8 32.5 32.1  RDW 13.8 13.9 14.0  PLT 309 380 410*    Cardiac EnzymesNo results for input(s): "TROPONINI" in the last 168 hours. No results for input(s): "TROPIPOC" in the last 168 hours.   BNPNo results for input(s): "BNP", "PROBNP" in the last 168 hours.   DDimer No results for input(s): "DDIMER" in the last 168 hours.   Radiology    DG Chest 2 View Result Date: 02/19/2024 CLINICAL DATA:  Left pleural effusion. EXAM: CHEST - 2 VIEW COMPARISON:  February 16, 2024. FINDINGS: Stable cardiomediastinal silhouette. Status post coronary artery bypass graft. Mild bibasilar subsegmental atelectasis or edema is noted with small pleural effusions. Right-sided PICC line is unchanged. Bony thorax is unremarkable. IMPRESSION: Mild bibasilar subsegmental atelectasis or edema is noted with small pleural effusions. Electronically Signed   By: Lupita Raider M.D.   On: 02/19/2024 08:23    Cardiac Studies   none  Patient Profile     73 y.o. female admitted with pleuritic chest pain and sob after CABG in the setting of longstanding tobacco abuse  Assessment & Plan    Chest pain - still present. She got some improvement with toradol. Will redose. Dyspnea - her weight is down 4 kg. No additional IV lasix. I'll give her an albuterol neb to see if she gets some improvement. Pleural effusion - her wbc remains elevated. No fever. At some point if her pain does not improve, consider  CT of the chest to rule out a loculated effusion/empyema.      For questions or updates, please contact [facility] Please consult [email or web address] for contact info under Cardiology/STEMI.      Signed, Lewayne Bunting, MD  02/19/2024, 9:19 AM

## 2024-02-20 ENCOUNTER — Inpatient Hospital Stay (HOSPITAL_COMMUNITY): Payer: Medicare HMO

## 2024-02-20 DIAGNOSIS — I5023 Acute on chronic systolic (congestive) heart failure: Secondary | ICD-10-CM

## 2024-02-20 DIAGNOSIS — E7800 Pure hypercholesterolemia, unspecified: Secondary | ICD-10-CM

## 2024-02-20 DIAGNOSIS — J90 Pleural effusion, not elsewhere classified: Secondary | ICD-10-CM | POA: Diagnosis not present

## 2024-02-20 DIAGNOSIS — I2583 Coronary atherosclerosis due to lipid rich plaque: Secondary | ICD-10-CM

## 2024-02-20 DIAGNOSIS — R0781 Pleurodynia: Secondary | ICD-10-CM | POA: Diagnosis not present

## 2024-02-20 DIAGNOSIS — I2510 Atherosclerotic heart disease of native coronary artery without angina pectoris: Secondary | ICD-10-CM | POA: Diagnosis not present

## 2024-02-20 LAB — BASIC METABOLIC PANEL
Anion gap: 8 (ref 5–15)
BUN: 10 mg/dL (ref 8–23)
CO2: 25 mmol/L (ref 22–32)
Calcium: 7.7 mg/dL — ABNORMAL LOW (ref 8.9–10.3)
Chloride: 111 mmol/L (ref 98–111)
Creatinine, Ser: 1.02 mg/dL — ABNORMAL HIGH (ref 0.44–1.00)
GFR, Estimated: 58 mL/min — ABNORMAL LOW (ref >60)
Glucose, Bld: 107 mg/dL — ABNORMAL HIGH (ref 70–99)
Potassium: 3.5 mmol/L (ref 3.5–5.1)
Sodium: 144 mmol/L (ref 135–145)

## 2024-02-20 LAB — CBC
HCT: 34.2 % — ABNORMAL LOW (ref 36.0–46.0)
Hemoglobin: 11.1 g/dL — ABNORMAL LOW (ref 12.0–15.0)
MCH: 26 pg (ref 26.0–34.0)
MCHC: 32.5 g/dL (ref 30.0–36.0)
MCV: 80.1 fL (ref 80.0–100.0)
Platelets: 559 10*3/uL — ABNORMAL HIGH (ref 150–400)
RBC: 4.27 MIL/uL (ref 3.87–5.11)
RDW: 14.4 % (ref 11.5–15.5)
WBC: 13.9 10*3/uL — ABNORMAL HIGH (ref 4.0–10.5)
nRBC: 0 % (ref 0.0–0.2)

## 2024-02-20 MED ORDER — FUROSEMIDE 40 MG PO TABS
40.0000 mg | ORAL_TABLET | Freq: Every day | ORAL | Status: DC
  Administered 2024-02-20 – 2024-02-21 (×2): 40 mg via ORAL
  Filled 2024-02-20 (×2): qty 1

## 2024-02-20 MED ORDER — PREDNISONE 10 MG PO TABS
10.0000 mg | ORAL_TABLET | Freq: Every day | ORAL | Status: DC
  Administered 2024-02-20 – 2024-02-21 (×2): 10 mg via ORAL
  Filled 2024-02-20 (×2): qty 1

## 2024-02-20 MED FILL — Calcium Chloride Inj 10%: INTRAVENOUS | Qty: 10 | Status: AC

## 2024-02-20 MED FILL — Sodium Chloride IV Soln 0.9%: INTRAVENOUS | Qty: 2000 | Status: AC

## 2024-02-20 MED FILL — Lidocaine HCl Local Soln Prefilled Syringe 100 MG/5ML (2%): INTRAMUSCULAR | Qty: 5 | Status: AC

## 2024-02-20 MED FILL — Heparin Sodium (Porcine) Inj 1000 Unit/ML: INTRAMUSCULAR | Qty: 10 | Status: AC

## 2024-02-20 MED FILL — Mannitol IV Soln 20%: INTRAVENOUS | Qty: 500 | Status: AC

## 2024-02-20 MED FILL — Sodium Bicarbonate IV Soln 8.4%: INTRAVENOUS | Qty: 100 | Status: AC

## 2024-02-20 MED FILL — Sodium Chloride IV Soln 0.9%: INTRAVENOUS | Qty: 300 | Status: CN

## 2024-02-20 MED FILL — Electrolyte-R (PH 7.4) Solution: INTRAVENOUS | Qty: 4000 | Status: AC

## 2024-02-20 NOTE — Progress Notes (Signed)
      [address]       [place] [address]             [phone]      10 Days Post-Op Procedure(s) (LRB): CORONARY ARTERY BYPASS GRAFTING (CABG) TIMES THREE USING LEFT INTERNAL MAMMARY ARTERY AND ENDOSCOPICALLY HARVESTED RIGHT GREATER SAPHENOUS VEIN. (N/A) TRANSESOPHAGEAL ECHOCARDIOGRAM (TEE) (N/A)  Subjective:  Patient sitting up in chair.  She states she wants to go home today.  She has been having increased oxygen requirements over the weekend and is now back up to 4L... She is ambulating with assistance.  She has been able to move her bowels.. oral intake remains poor  Objective: Vital signs in last 24 hours: Temp:  [98.1 F (36.7 C)-98.4 F (36.9 C)] 98.4 F (36.9 C) (02/24 0315) Pulse Rate:  [73-87] 73 (02/24 0315) Cardiac Rhythm: Normal sinus rhythm (02/23 2100) Resp:  [17-20] 20 (02/24 0315) BP: (104-134)/(65-78) 133/72 (02/24 0315) SpO2:  [93 %-98 %] 93 % (02/24 0315) Weight:  [59.8 kg] 59.8 kg (02/24 0555)  Intake/Output from previous day: 02/23 0701 - 02/24 0700 In: 120 [P.O.:120] Out: -   General appearance: alert, cooperative, and no distress Heart: regular rate and rhythm Lungs: diminished breath sounds bibasilar Abdomen: soft, non-tender; bowel sounds normal; no masses,  no organomegaly Extremities: edema minimal Wound: clean and dry  Lab Results: Recent Labs    02/20/24 0500  WBC 13.9*  HGB 11.1*  HCT 34.2*  PLT 559*   BMET:  Recent Labs    02/20/24 0500  NA 144  K 3.5  CL 111  CO2 25  GLUCOSE 107*  BUN 10  CREATININE 1.02*  CALCIUM 7.7*    PT/INR: No results for input(s): "LABPROT", "INR" in the last 72 hours. ABG    Component Value Date/Time   PHART 7.382 02/11/2024 0319   HCO3 22.2 02/11/2024 0319   TCO2 23 02/11/2024 0319   ACIDBASEDEF 2.0 02/11/2024 0319   O2SAT 94.1 02/17/2024 1248   CBG (last 3)  Recent Labs    02/18/24 2153  GLUCAP 165*    Assessment/Plan: S/P Procedure(s) (LRB): CORONARY  ARTERY BYPASS GRAFTING (CABG) TIMES THREE USING LEFT INTERNAL MAMMARY ARTERY AND ENDOSCOPICALLY HARVESTED RIGHT GREATER SAPHENOUS VEIN. (N/A) TRANSESOPHAGEAL ECHOCARDIOGRAM (TEE) (N/A)  CV- NSR, BP remains well controlled- continue ASA, Coreg, Zetia, Aldactone and Farxiga.. AHF has signed off Pulm- underlying emphysema- remains on oxygen at 4L.. needs to be weaned down to 2L prior to discharge... CT scan over the weekend showed small bilateral pleural effusions.. continue aggressive pulmonary toilet.. steroids ordered Renal- creatinine stable.. no further Toradol as she can not be discharged on this medication... Lasix, potassium ordered GI- constipation resolved.. continue prn agents.. increase oral intake as able, continue ensure Deconditioning- mild, home RN and oxygen arranged Dispo- patient needs to walk more, aggressive pulmonary toilet.[name] Kitchen diuretics/steroids today.. home once on 2L of oxygen.. hopefully in Am   LOS: 14 days    Lowella Dandy, PA-C 02/20/2024

## 2024-02-20 NOTE — Progress Notes (Addendum)
 Physical Therapy Treatment/ discharge Patient Details Name: Meghan Lynn MRN: [id number] DOB: 1951/08/24 Today's Date: 02/20/2024   History of Present Illness 73 yo female admitted 2/10 with chest pain and NSTEMI. 2/12 LHC. 2/14 CABGx 3. 2/15 IABP removed. PMHx: CAD, HLD, HTN, esophageal stricture    PT Comments  Pt pleasant and able to state all sternal precautions prior to mobility and maintaining with activity. Pt with increased gait, transfers and repeated STS without assist. Pt with drop in SPO2 to 87% with transferring to and from bed but maintaining SPO2 >90% on 1L with gait. Pt educated for all restrictions, progressive walking program and encouraged to have home pulse ox. Education complete without further acute therapy needs, will defer to cardiac rehab.   SPO2 90% at rest on RA Drop to 88% with transition to supine on RA 91% on 1L with gait    If plan is discharge home, recommend the following: Assistance with cooking/housework;A little help with bathing/dressing/bathroom   Can travel by private vehicle        Equipment Recommendations  Other (comment) (shower seat)    Recommendations for Other Services       Precautions / Restrictions Precautions Precautions: Sternal Recall of Precautions/Restrictions: Intact     Mobility  Bed Mobility Overal bed mobility: Modified Independent Bed Mobility: Supine to Sit, Sit to Supine           General bed mobility comments: pt able to tranfers supine<>sit without assist maintaining precautions with bed flat    Transfers Overall transfer level: Modified independent   Transfers: Sit to/from Stand             General transfer comment: pt able to rise from bed and chair without assist and maintaining precautions. Pt performed 10 repeated STS end of session in 60 sec with appropriate hand placement    Ambulation/Gait Ambulation/Gait assistance: Modified independent (Device/Increase time) Gait Distance (Feet): 400  Feet Assistive device: Rolling walker (2 wheels) Gait Pattern/deviations: Step-through pattern, Decreased stride length   Gait velocity interpretation: 1.31 - 2.62 ft/sec, indicative of limited community ambulator   General Gait Details: one standing rest break after 300' and SPO2 maintained >90% on 1L with gait   Stairs             Wheelchair Mobility     Tilt Bed    Modified Rankin (Stroke Patients Only)       Balance Overall balance assessment: Mild deficits observed, not formally tested                                          Communication Communication Communication: No apparent difficulties  Cognition Arousal: Alert Behavior During Therapy: WFL for tasks assessed/performed   PT - Cognitive impairments: No apparent impairments                         Following commands: Intact      Cueing Cueing Techniques: Verbal cues  Exercises      General Comments General comments (skin integrity, edema, etc.): pt on 3L at start of session with SpO2 > 90%, attempted to wean pt to 2L during functional ambulation with pt desating to 87%. education provided on energy conservation strategies for home. pt seems to have a good grasp on econ techniques. pt able to accurately state parameters for IS and importance of pulmonary toiletries  Pertinent Vitals/Pain Pain Assessment Pain Assessment: No/denies pain    Home Living                          Prior Function            PT Goals (current goals can now be found in the care plan section) Progress towards PT goals: Goals met/education completed, patient discharged from PT    Frequency    Min 1X/week      PT Plan      Co-evaluation              AM-PAC PT "6 Clicks" Mobility   Outcome Measure  Help needed turning from your back to your side while in a flat bed without using bedrails?: None Help needed moving from lying on your back to sitting on the side of  a flat bed without using bedrails?: None Help needed moving to and from a bed to a chair (including a wheelchair)?: None Help needed standing up from a chair using your arms (e.g., wheelchair or bedside chair)?: None Help needed to walk in hospital room?: A Little Help needed climbing 3-5 steps with a railing? : A Little 6 Click Score: 22    End of Session   Activity Tolerance: Patient tolerated treatment well Patient left: in chair;with call bell/phone within reach Nurse Communication: Mobility status PT Visit Diagnosis: Other abnormalities of gait and mobility (R26.89);Difficulty in walking, not elsewhere classified (R26.2)     Time: 7829-5621 PT Time Calculation (min) (ACUTE ONLY): 26 min  Charges:    $Gait Training: 8-22 mins $Therapeutic Activity: 8-22 mins PT General Charges $$ ACUTE PT VISIT: 1 Visit                     Merryl Hacker, PT Acute Rehabilitation Services Office: [phone]    Meghan Lynn 02/20/2024, 11:52 AM

## 2024-02-20 NOTE — Progress Notes (Addendum)
 Occupational Therapy Treatment Patient Details Name: Meghan Lynn MRN: [id number] DOB: Dec 27, 1951 Today's Date: 02/20/2024   History of present illness 73 yo female admitted 2/10 with chest pain and NSTEMI. 2/12 LHC. 2/14 CABGx 3. 2/15 IABP removed. PMHx: CAD, HLD, HTN, esophageal stricture   OT comments  Pt making steady progress towards OT goals this session. Pt continues to present with decreased activity tolerance impacting pts ability to complete ADLs independently. Pt currently requires supervision for standing grooming tasks at sink and CGA- supervision for ambulatory ADL transfers with RW greater than a household distance. Pt on 3L during session with SpO2 >90%, attempted to wean pt to 2L during functional ambulation with SPO2 dropping to 87%. Pt takes rest breaks appropriately during functional ambulation d/t noticeable increased WOB. Reviewed energy conservation techniques for home, pulmonary toiletry devices and appropriate SpO2 sat for home. Pt already has needed DME at home. Pt eager to DC when able. Will continue to follow acutely for OT needs. Recommending HHOT at this time for OT f/u.        If plan is discharge home, recommend the following:  Assistance with cooking/housework;A little help with bathing/dressing/bathroom;Assist for transportation;Help with stairs or ramp for entrance   Equipment Recommendations  None recommended by OT    Recommendations for Other Services      Precautions / Restrictions Precautions Precautions: Sternal;Fall Recall of Precautions/Restrictions: Intact Precaution/Restrictions Comments: pt adhering to sternal precautions with mobility Restrictions Weight Bearing Restrictions Per Provider Order: No (sternal)       Mobility Bed Mobility               General bed mobility comments: in recliner    Transfers Overall transfer level: Needs assistance Equipment used: Rolling walker (2 wheels) Transfers: Sit to/from Stand Sit to  Stand: Contact guard assist           General transfer comment: good adherence to sternal precautions, CGA initially for safety but also able to stand from toilet with hands on knees with CGA     Balance Overall balance assessment: Mild deficits observed, not formally tested (standing unsupported at sink for grooming tasks with no LOB, balance not formally challenged)                                         ADL either performed or assessed with clinical judgement   ADL Overall ADL's : Needs assistance/impaired     Grooming: Wash/dry hands;Standing;Supervision/safety       Lower Body Bathing: Supervison/ safety Lower Body Bathing Details (indicate cue type and reason): simulated via pericare with supervision     Lower Body Dressing: Total assistance;Sit to/from stand Lower Body Dressing Details (indicate cue type and reason): donned slipper with total A for time mgmt Toilet Transfer: Supervision/safety;Rolling walker (2 wheels);Ambulation Toilet Transfer Details (indicate cue type and reason): pt confirms elevated toilet seate at home Toileting- Clothing Manipulation and Hygiene: Supervision/safety;Sitting/lateral lean Toileting - Clothing Manipulation Details (indicate cue type and reason): education provided on compensatory techniques for pericare d/t sternal precautions     Functional mobility during ADLs: Supervision/safety;Rolling walker (2 wheels) General ADL Comments: ADL participation impacted by decreased activity tolerance however pt compensating well with energy conservation techniques    Extremity/Trunk Assessment Upper Extremity Assessment Upper Extremity Assessment: Overall WFL for tasks assessed   Lower Extremity Assessment Lower Extremity Assessment: Defer to PT evaluation   Cervical /  Trunk Assessment Cervical / Trunk Assessment: Normal    Vision Baseline Vision/History: 0 No visual deficits     Perception Perception Perception:  Within Functional Limits   Praxis Praxis Praxis: WFL   Communication Communication Communication: No apparent difficulties   Cognition Arousal: Alert Behavior During Therapy: WFL for tasks assessed/performed Cognition: No apparent impairments                               Following commands: Intact        Cueing   Cueing Techniques: Verbal cues  Exercises      Shoulder Instructions       General Comments pt on 3L at start of session with SpO2 > 90%, attempted to wean pt to 2L during functional ambulation with pt desating to 87%. education provided on energy conservation strategies for home. pt seems to have a good grasp on econ techniques. pt able to accurately state parameters for IS and importance of pulmonary toiletries    Pertinent Vitals/ Pain       Pain Assessment Pain Assessment: No/denies pain  Home Living                                          Prior Functioning/Environment              Frequency  Min 1X/week        Progress Toward Goals  OT Goals(current goals can now be found in the care plan section)  Progress towards OT goals: Progressing toward goals  Acute Rehab OT Goals OT Goal Formulation: With patient Potential to Achieve Goals: Good  Plan      Co-evaluation                 AM-PAC OT "6 Clicks" Daily Activity     Outcome Measure   Help from another person eating meals?: None Help from another person taking care of personal grooming?: None Help from another person toileting, which includes using toliet, bedpan, or urinal?: A Little Help from another person bathing (including washing, rinsing, drying)?: A Little Help from another person to put on and taking off regular upper body clothing?: None Help from another person to put on and taking off regular lower body clothing?: A Little 6 Click Score: 21    End of Session Equipment Utilized During Treatment: Rolling walker (2 wheels);Gait  belt  OT Visit Diagnosis: Unsteadiness on feet (R26.81);Muscle weakness (generalized) (M62.81);Pain (decreased activity tolerance)   Activity Tolerance Patient tolerated treatment well   Patient Left in chair;with call bell/phone within reach   Nurse Communication Mobility status (via secure chat)        Time: 6962-9528 OT Time Calculation (min): 40 min  Charges: OT General Charges $OT Visit: 1 Visit OT Treatments $Self Care/Home Management : 38-52 mins  Lenor Derrick., COTA/L Acute Rehabilitation Services [phone]  Danine Hor 02/20/2024, 9:29 AM

## 2024-02-20 NOTE — Progress Notes (Signed)
 Progress Note  Patient Name: Meghan Lynn Date of Encounter: 02/20/2024  Primary Cardiologist: Chrystie Nose, MD   Subjective   Feels much better today.  O2 at 4L with O2 sats 94%.  Anxious to go home  Inpatient Medications    Scheduled Meds:  Continuous Infusions:  PRN Meds: albuterol, ALPRAZolam, metoprolol tartrate, ondansetron (ZOFRAN) IV, mouth rinse, oxyCODONE, sodium chloride flush, traMADol   Vital Signs    Vitals:   02/19/24 2220 02/20/24 0315 02/20/24 0555 02/20/24 0800  BP: 120/78 133/72  114/88  Pulse: 80 73  80  Resp: 20 20  18   Temp: 98.1 F (36.7 C) 98.4 F (36.9 C)  98.1 F (36.7 C)  TempSrc: Oral Oral  Oral  SpO2: 94% 93%  94%  Weight:   59.8 kg   Height:        Intake/Output Summary (Last 24 hours) at 02/20/2024 0902 Last data filed at 02/19/2024 1935 Gross per 24 hour  Intake 120 ml  Output --  Net 120 ml   Filed Weights   02/18/24 0441 02/19/24 0452 02/20/24 0555  Weight: 64.1 kg 60.1 kg 59.8 kg    Telemetry    NSR - Personally Reviewed  ECG    none - Personally Reviewed  Physical Exam   GEN: Well nourished, well developed in no acute distress HEENT: Normal NECK: No JVD; No carotid bruits LYMPHATICS: No lymphadenopathy CARDIAC:RRR, no murmurs, rubs, gallops RESPIRATORY:  Clear to auscultation without rales, wheezing or rhonchi  ABDOMEN: Soft, non-tender, non-distended MUSCULOSKELETAL:  No edema; No deformity  SKIN: Warm and dry NEUROLOGIC:  Alert and oriented x 3 PSYCHIATRIC:  Normal affect  Labs    Chemistry Recent Labs  Lab 02/16/24 0544 02/17/24 0546 02/20/24 0500  NA 139 139 144  K 3.7 3.9 3.5  CL 99 99 111  CO2 29 26 25   GLUCOSE 130* 125* 107*  BUN 17 13 10   CREATININE 1.23* 1.08* 1.02*  CALCIUM 9.0 9.0 7.7*  GFRNONAA 46* 54* 58*  ANIONGAP 11 14 8      Hematology Recent Labs  Lab 02/16/24 0544 02/17/24 0546 02/20/24 0500  WBC 14.1* 15.6* 13.9*  RBC 4.15 4.08 4.27  HGB 10.8* 10.4* 11.1*   HCT 33.2* 32.4* 34.2*  MCV 80.0 79.4* 80.1  MCH 26.0 25.5* 26.0  MCHC 32.5 32.1 32.5  RDW 13.9 14.0 14.4  PLT 380 410* 559*    Cardiac EnzymesNo results for input(s): "TROPONINI" in the last 168 hours. No results for input(s): "TROPIPOC" in the last 168 hours.   BNPNo results for input(s): "BNP", "PROBNP" in the last 168 hours.   DDimer No results for input(s): "DDIMER" in the last 168 hours.   Radiology    CT CHEST WO CONTRAST Result Date: 02/19/2024 CLINICAL DATA:  Status post CABG, persistent shortness of breath and sternal pain EXAM: CT CHEST WITHOUT CONTRAST TECHNIQUE: Multidetector CT imaging of the chest was performed following the standard protocol without IV contrast. RADIATION DOSE REDUCTION: This exam was performed according to the departmental dose-optimization program which includes automated exposure control, adjustment of the mA and/or kV according to patient size and/or use of iterative reconstruction technique. COMPARISON:  12/30/2023, 02/19/2024 FINDINGS: Cardiovascular: Limited unenhanced imaging of the heart is unremarkable, with postsurgical changes from recent CABG. There is extensive atherosclerosis throughout the native coronary vessels. Thoracic aorta is normal in caliber, with diffuse atherosclerosis. Evaluation of the vascular lumen is limited without IV contrast. Right-sided PICC tip within the superior vena cava. Mediastinum/Nodes:  Postsurgical changes from recent CABG and median sternotomy. Fluid within the anterior mediastinum measuring up to 1.6 cm in thickness is likely related to recent surgery. Thyroid, trachea, and esophagus are stable. Subcentimeter mediastinal and hilar lymph nodes are likely reactive. No pathologic adenopathy. Lungs/Pleura: Upper lobe predominant centrilobular emphysema. There are small bilateral pleural effusions. Volume estimated less than 500 cc each. Compressive atelectasis within the bilateral lower lobes. No airspace disease or  pneumothorax. Central airways are patent. Upper Abdomen: No acute abnormality.  Stable right adrenal adenoma. Musculoskeletal: Postsurgical changes from median sternotomy and recent CABG. Median sternotomy wires appear intact. There is an acute displaced right posterior first rib fracture, new since prior CT. No other acute or destructive bony abnormalities. Reconstructed images demonstrate no additional findings. IMPRESSION: 1. Postsurgical changes from recent CABG, with minimal fluid in the anterior mediastinum likely related to seroma or resolving hematoma. 2. Acute minimally displaced right posterior first rib fracture, new since prior CT 12/30/2023. 3. Small bilateral pleural effusions, volume estimated less than 500 cc each. Compressive atelectasis within the dependent lower lobes. 4. Aortic Atherosclerosis (ICD10-I70.0) and Emphysema (ICD10-J43.9). Electronically Signed   By: Sharlet Salina M.D.   On: 02/19/2024 15:32   DG Chest 2 View Result Date: 02/19/2024 CLINICAL DATA:  Left pleural effusion. EXAM: CHEST - 2 VIEW COMPARISON:  February 16, 2024. FINDINGS: Stable cardiomediastinal silhouette. Status post coronary artery bypass graft. Mild bibasilar subsegmental atelectasis or edema is noted with small pleural effusions. Right-sided PICC line is unchanged. Bony thorax is unremarkable. IMPRESSION: Mild bibasilar subsegmental atelectasis or edema is noted with small pleural effusions. Electronically Signed   By: Lupita Raider M.D.   On: 02/19/2024 08:23    Cardiac Studies   none  Patient Profile     73 y.o. female admitted with pleuritic chest pain and sob after CABG in the setting of longstanding tobacco abuse  Assessment & Plan    Pleuritic Chest pain  Dyspnea Emphysema Pleural Effusions - s/p recent CABG - CP improved with Toradol - CT yesterday with minimal fluid in anterior mediastinum related to seroma or resolving hematoma, actue right first rib fx, small b/l pleural effusions  with compressive atelectasis - started on Steroids per CVTS  ASCAD HLD - s/p recent CABP - maintaining NSR on tele - no anginal sx - Continue ASA 81mg  daily, Carvedilol 3.125mg  BID, Plavix 75mg  daily - statin intolerant - continue Zetia 10mg  daily and transition back to home dose of Nexlizet 180-10mg  daily - LDL was 104 on 02/06/24 - refer to lipid clinic outpt for PCSK9i therapy  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 Recommendations for medications and follow up:  Discharge Medications: Continue medications as they are currently listed in the [facility]. Exceptions to the above: Restart Valsartan but at 80mg  daily (was on 320mg  daily at home) Restart home dose of Nexlizet 180-10mg  daily  Follow Up: The patient's Primary Cardiologist is Chrystie Nose, MD  Follow up in the office in 2 week(s). Followup BMET in 1 week Follow up in lipid clinic  I spent 35 minutes caring for this patient today face to face, ordering and reviewing labs, reviewing records from this  hospitalization, 2D echo 02/08/2024 , seeing the patient, documenting in the record, and arranging for followup outpt including referral to lipid clinic  Signed,  Armanda Magic, MD  9:13 AM 02/20/2024  [facility]      For questions or updates, please contact [facility] Please consult [email or web address] for contact info under Cardiology/STEMI.      Signed, Armanda Magic, MD  02/20/2024, 9:02 AM

## 2024-02-20 NOTE — Progress Notes (Signed)
 SATURATION QUALIFICATIONS: (This note is used to comply with regulatory documentation for home oxygen)  Patient Saturations on Room Air at Rest = 90%  Patient Saturations on Room Air while Ambulating = 88%  Patient Saturations on 1 Liters of oxygen while Ambulating = 91%  Please briefly explain why patient needs home oxygen:Pt with desaturation with activity requiring 1L to maintain sPO2 with functional activity  Merryl Hacker, PT Acute Rehabilitation Services Office: [phone]

## 2024-02-20 NOTE — Progress Notes (Signed)
 Pt's 02 sat on RA = 88-92%. Pt denied sob.   Lawson Radar, RN

## 2024-02-21 ENCOUNTER — Other Ambulatory Visit (HOSPITAL_COMMUNITY): Payer: Self-pay

## 2024-02-21 MED ORDER — OXYCODONE HCL 5 MG PO TABS
5.0000 mg | ORAL_TABLET | ORAL | 0 refills | Status: DC | PRN
  Filled 2024-02-21: qty 30, 5d supply, fill #0

## 2024-02-21 MED ORDER — PREDNISONE 10 MG PO TABS
10.0000 mg | ORAL_TABLET | Freq: Every day | ORAL | 0 refills | Status: DC
  Filled 2024-02-21: qty 4, 4d supply, fill #0

## 2024-02-21 MED ORDER — CLOPIDOGREL BISULFATE 75 MG PO TABS
75.0000 mg | ORAL_TABLET | Freq: Every day | ORAL | 3 refills | Status: DC
  Filled 2024-02-21: qty 30, 30d supply, fill #0

## 2024-02-21 MED ORDER — ENSURE ENLIVE PO LIQD: 237.0000 mL | Freq: Three times a day (TID) | ORAL | Status: DC

## 2024-02-21 MED ORDER — POTASSIUM CHLORIDE CRYS ER 20 MEQ PO TBCR
20.0000 meq | EXTENDED_RELEASE_TABLET | Freq: Every day | ORAL | 3 refills | Status: AC
  Filled 2024-02-21: qty 30, 30d supply, fill #0

## 2024-02-21 MED ORDER — ASPIRIN 81 MG PO TBEC: 81.0000 mg | DELAYED_RELEASE_TABLET | Freq: Every day | ORAL | Status: AC

## 2024-02-21 MED ORDER — CARVEDILOL 3.125 MG PO TABS
3.1250 mg | ORAL_TABLET | Freq: Two times a day (BID) | ORAL | 3 refills | Status: DC
  Filled 2024-02-21: qty 60, 30d supply, fill #0

## 2024-02-21 MED ORDER — SPIRONOLACTONE 25 MG PO TABS
12.5000 mg | ORAL_TABLET | Freq: Every day | ORAL | 3 refills | Status: DC
  Filled 2024-02-21: qty 30, 60d supply, fill #0

## 2024-02-21 MED ORDER — FUROSEMIDE 40 MG PO TABS
40.0000 mg | ORAL_TABLET | Freq: Every day | ORAL | 1 refills | Status: AC
  Filled 2024-02-21: qty 30, 30d supply, fill #0

## 2024-02-21 MED ORDER — DAPAGLIFLOZIN PROPANEDIOL 10 MG PO TABS
10.0000 mg | ORAL_TABLET | Freq: Every day | ORAL | 3 refills | Status: DC
  Filled 2024-02-21: qty 30, 30d supply, fill #0

## 2024-02-21 NOTE — TOC Transition Note (Signed)
 Transition of Care (TOC) - Discharge Note Donn Pierini RN, BSN Transitions of Care Unit 4E- RN Case Manager See Treatment Team for direct phone #   Patient Details  Name: Meghan Lynn MRN: [id number] Date of Birth: [birth year]  Transition of [facility]) CM/SW Contact:  Darrold Span, RN Phone Number: 02/21/2024, 11:12 AM   Clinical Narrative:    Pt stable for transition home today, [facility] has been set up with [facility], CM has spoken with liaison for start of care- confirmed pt is on schedule for initial visit tomorrow 2/26. [facility] to call pt to schedule time.   Home 02 has been set up w/ [facility]- CM confirmed pt has portable tank at bedside for transport home. Call made to [facility] Morrie Sheldon to confirm home delivery- per Morrie Sheldon pt is on the schedule for home equipment delivery today.   Daughter at bedside to transport home.   No further TOC needs noted.    Final next level of care: Home w Home Health Services Barriers to Discharge: No Barriers Identified   Patient Goals and CMS Choice Patient states their goals for this hospitalization and ongoing recovery are:: wants to remain independent CMS Medicare.gov Compare Post Acute Care list provided to:: Patient Choice offered to / list presented to : Adult Children      Discharge Placement               Home w/ [facility]        Discharge Plan and Services Additional resources added to the After Visit Summary for     Discharge Planning Services: CM Consult Post Acute Care Choice: Home Health          DME Arranged: Oxygen DME Agency: Patsy Lager Date DME Agency Contacted: 02/16/24 Time DME Agency Contacted: [phone] Representative spoke with at DME Agency: Morrie Sheldon HH Arranged: RN, PT [facility] Agency: [facility] Date HH Agency Contacted: 02/16/24 Time HH Agency Contacted: 1257 Representative spoke with at [facility] Agency: Adele Dan  Social Drivers of Health (SDOH) Interventions SDOH Screenings   Food  Insecurity: No Food Insecurity (02/07/2024)  Housing: Low Risk  (02/07/2024)  Transportation Needs: No Transportation Needs (02/07/2024)  Utilities: Not At Risk (02/07/2024)  Alcohol Screen: Low Risk  (04/14/2020)  Depression (PHQ2-9): Low Risk  (04/14/2020)  Social Connections: Moderately Isolated (02/07/2024)  Tobacco Use: High Risk (02/10/2024)     Readmission Risk Interventions    02/21/2024   11:12 AM  Readmission Risk Prevention Plan  Transportation Screening Complete  PCP or Specialist Appt within 3-5 Days Complete  HRI or Home Care Consult Complete  Social Work Consult for Recovery Care Planning/Counseling Complete  Palliative Care Screening Not Applicable  Medication Review Oceanographer) Complete

## 2024-02-21 NOTE — Progress Notes (Addendum)
 Discharge instructions reviewed with Meghan Lynn and her daughter.  Copy of instructions given to Meghan Lynn. [facility] has filled scripts and will be picked up on the way out for discharge. Chest tube sutures x2 were d/c'd per order without difficulty, steri strips applied, sites unremarkable, and intact.  PICC line removal site dressing CDI.  Confirmed with CM that HH will be starting tomorrow and the company will be calling her today to set up time. Meghan Lynn has a portable O2 tank at the bedside and will go home with her today, home O2 concentrator tank will be delivered to the home today, this morning per CM.  Meghan Lynn getting dressed at this time.  Meghan Lynn will be d/c'd via wheelchair with belongings, with her daughter and will be   escorted by staff with O2 tank.   Ryle Buscemi,RN SWOT

## 2024-02-21 NOTE — Progress Notes (Signed)
 CARDIAC REHAB PHASE I    Postop [facility] education completed. Referral for CRP2 sent to [facility]. Plan for discharge home today.    Woodroe Chen, RN BSN 02/21/2024 7:57 AM

## 2024-02-21 NOTE — Progress Notes (Addendum)
      [address]       [place] [address]             [phone]      11 Days Post-Op Procedure(s) (LRB): CORONARY ARTERY BYPASS GRAFTING (CABG) TIMES THREE USING LEFT INTERNAL MAMMARY ARTERY AND ENDOSCOPICALLY HARVESTED RIGHT GREATER SAPHENOUS VEIN. (N/A) TRANSESOPHAGEAL ECHOCARDIOGRAM (TEE) (N/A)  Subjective:  Sitting up in chair.  Feeling better and is ready to go home.  She is ambulating without difficulty.  Having some loose stools especially after ensure.  Coughing up clear sputum  Objective: Vital signs in last 24 hours: Temp:  [97.7 F (36.5 C)-98.5 F (36.9 C)] 98.3 F (36.8 C) (02/25 0333) Pulse Rate:  [73-93] 78 (02/25 0333) Cardiac Rhythm: Normal sinus rhythm (02/24 2345) Resp:  [18-20] 20 (02/25 0333) BP: (106-133)/(66-88) 129/76 (02/25 0333) SpO2:  [91 %-96 %] 94 % (02/25 0333) Weight:  [59.4 kg] 59.4 kg (02/25 0612)  Intake/Output from previous day: 02/24 0701 - 02/25 0700 In: 350 [P.O.:350] Out: 250 [Urine:250]  General appearance: alert, cooperative, and no distress Heart: regular rate and rhythm Lungs: diminished breath sounds bibasilar Abdomen: soft, non-tender; bowel sounds normal; no masses,  no organomegaly Extremities: edema trace Wound: clean and dry  Lab Results: Recent Labs    02/20/24 0500  WBC 13.9*  HGB 11.1*  HCT 34.2*  PLT 559*   BMET:  Recent Labs    02/20/24 0500  NA 144  K 3.5  CL 111  CO2 25  GLUCOSE 107*  BUN 10  CREATININE 1.02*  CALCIUM 7.7*    PT/INR: No results for input(s): "LABPROT", "INR" in the last 72 hours. ABG    Component Value Date/Time   PHART 7.382 02/11/2024 0319   HCO3 22.2 02/11/2024 0319   TCO2 23 02/11/2024 0319   ACIDBASEDEF 2.0 02/11/2024 0319   O2SAT 94.1 02/17/2024 1248   CBG (last 3)  Recent Labs    02/18/24 2153  GLUCAP 165*    Assessment/Plan: S/P Procedure(s) (LRB): CORONARY ARTERY BYPASS GRAFTING (CABG) TIMES THREE USING LEFT INTERNAL MAMMARY ARTERY  AND ENDOSCOPICALLY HARVESTED RIGHT GREATER SAPHENOUS VEIN. (N/A) TRANSESOPHAGEAL ECHOCARDIOGRAM (TEE) (N/A)  CV- NSR, BP is stable- on Coreg, Farxiga, Aldactone Pulm- weaned oxygen down to 2L.. continue IS... steroids, mucinex prn Renal- creatinine stable, will continue Lasix, potassium x 7 days...[name] KitchenMarland Kitchen transition to prn GI- constipation resolved, continue to increase oral intake Dispo- patient stable, doing well overall... will discharge   LOS: 15 days   Lowella Dandy, PA-C 02/21/2024  Patient seen and examined, agree with above Looks better Home today Short term home O2  Viviann Spare C. Dorris Fetch, MD [facility] [phone]

## 2024-02-22 ENCOUNTER — Telehealth: Payer: Self-pay | Admitting: *Deleted

## 2024-02-22 NOTE — Transitions of Care (Post Inpatient/ED Visit) (Signed)
 02/22/2024  Name: Meghan Lynn MRN: [id number] DOB: [birth year]  Today's TOC FU Call Status: Today's TOC FU Call Status:: Successful TOC FU Call Completed TOC FU Call Complete Date: 02/22/24 Patient's Name and Date of Birth confirmed.  Transition Care Management Follow-up Telephone Call Date of Discharge: 02/21/24 Discharge Facility: [facility]) Type of Discharge: Inpatient Admission Primary Inpatient Discharge Diagnosis:: NSTEMI- CABG x 3 How have you been since you were released from the hospital?: Better ("I didn't rest well much last night, just couldn't get comfortable, but I know it will take time before the discomfort goes away.  My daughters arestaying with me and rotating so I don't have to be by myself.  I am doing fine") Any questions or concerns?: No  Items Reviewed: Did you receive and understand the discharge instructions provided?: Yes (thoroughly reviewed with patient and her daughter who verbalizes good understanding of same- however, they do not have papers with them: manually provided specific location and phone numbers for all upcoming provider office visits; other sister has AVS) Medications obtained,verified, and reconciled?: Partial Review Completed Medications Not Reviewed Reasons:: Other: Reason for Partial Mediation Review: Patient stated home health nurse came earlier and reviewed: she and her daughter does not wish to re-review; confirms she got all newly prescribed medications and denies questions: reports she is going to call outpatient pharmacy to obtain refill of "cholesterol medicine" today Any new allergies since your discharge?: No Dietary orders reviewed?: Yes Type of Diet Ordered:: "Heart Healthy, low salt" Do you have support at home?: Yes People in Home: alone Name of Support/Comfort Primary Source: Reports independent in self-care activities at baseline; supportive local daughters rotating staying with patient during post-op period/ daughters  assists as/ if needed/ indicated  Medications Reviewed Today: Medications Reviewed Today     Reviewed by Michaela Corner, RN (Registered Nurse) on 02/22/24 at 1430  Med List Status: <None>   Medication Order Taking? Sig Documenting Provider Last Dose Status Informant  albuterol (VENTOLIN HFA) 108 (90 Base) MCG/ACT inhaler [id number]  Inhale 2 puffs into the lungs every 6 (six) hours as needed for wheezing or shortness of breath. Hunsucker, Lesia Sago, MD  Active Self, Pharmacy Records           Med Note Otelia Limes Feb 22, 2024  2:30 PM) 02/22/24: Declines full review of medications during [facility] call today: states home health nurse has visited and already reviewed all medications and assisted with filling weekly pill box  aspirin EC 81 MG tablet [id number]  Take 1 tablet (81 mg total) by mouth daily. Swallow whole. Barrett, Rae Roam, PA-C  Active   Bempedoic Acid-Ezetimibe (NEXLIZET) 180-10 MG TABS [id number] Yes Take 1 tablet by mouth at bedtime. Sharlene Dory, PA-C Taking Active Self, Pharmacy Records           Med Note Otelia Limes Feb 22, 2024  2:29 PM) 02/22/24:  Reports during TOC call, she needs refill, states she thinks "dose was increased" during recent hospitalization- however, I can not verify this  Biotin 1 MG CAPS [id number]  Take 1 mg by mouth daily.  [provider]  Active Self, Pharmacy Records  carvedilol (COREG) 3.125 MG tablet [id number]  Take 1 tablet (3.125 mg total) by mouth 2 (two) times daily with a meal. Barrett, Erin R, PA-C  Active   clopidogrel (PLAVIX) 75 MG tablet [id number]  Take 1 tablet (75 mg total) by mouth daily. Barrett, Denny Peon  R, PA-C  Active   dapagliflozin propanediol (FARXIGA) 10 MG TABS tablet [id number]  Take 1 tablet (10 mg total) by mouth daily. Barrett, Rae Roam, PA-C  Active   feeding supplement (ENSURE ENLIVE / ENSURE PLUS) LIQD [id number]  Take 237 mLs by mouth 3 (three) times daily with meals. Barrett, Rae Roam, PA-C  Active    furosemide (LASIX) 40 MG tablet [id number]  Take 1 tablet (40 mg total) by mouth daily. X 7 days, then transition to daily as needed for weight gain of 3-5 lbs in 24-48 hours Barrett, Rae Roam, PA-C  Active   Multiple Vitamin (MULTIVITAMIN WITH MINERALS) TABS [id number]  Take 1 tablet by mouth daily. [provider]  Active Self, Pharmacy Records  nitroGLYCERIN (NITROSTAT) 0.4 MG SL tablet [id number]  Place 1 tablet (0.4 mg total) under the tongue every 5 (five) minutes as needed for chest pain. Carlos Levering, NP  Expired 02/06/24 2359 Self, Pharmacy Records  omeprazole (PRILOSEC) 20 MG capsule [id number]  Take 20 mg by mouth daily. [provider]  Active Self, Pharmacy Records  oxyCODONE (OXY IR/ROXICODONE) 5 MG immediate release tablet [id number]  Take 1 tablet (5 mg total) by mouth every 4 (four) hours as needed for severe pain (pain score 7-10). Barrett, Erin R, PA-C  Active   potassium chloride SA (KLOR-CON M) 20 MEQ tablet [id number]  Take 1 tablet (20 mEq total) by mouth daily. X 7 days, then use only on days you take Lasix Barrett, Erin R, PA-C  Active   predniSONE (DELTASONE) 10 MG tablet [id number]  Take 1 tablet (10 mg total) by mouth daily before breakfast. Barrett, Erin R, PA-C  Active   spironolactone (ALDACTONE) 25 MG tablet [id number]  Take 0.5 tablets (12.5 mg total) by mouth daily. Barrett, Rae Roam, PA-C  Active   umeclidinium-vilanterol (ANORO ELLIPTA) 62.5-25 MCG/ACT AEPB [id number]  Inhale 1 puff into the lungs daily. Hunsucker, Lesia Sago, MD  Active Self, Pharmacy Records           Home Care and Equipment/Supplies: Were Home Health Services Ordered?: Yes Name of Home Health Agency:: [facility] RN Has Agency set up a time to come to your home?: Yes First Home Health Visit Date: 02/22/24 Any new equipment or medical supplies ordered?: Yes (home O2- for short term use at home) Name of Medical supply agency?: [facility] Were you able to get the  equipment/medical supplies?: Yes Do you have any questions related to the use of the equipment/supplies?: No  Functional Questionnaire: Do you need assistance with bathing/showering or dressing?: Yes (daughters supervising/ assisting as indicated during post-op period) Do you need assistance with meal preparation?: Yes (daughters supervising/ assisting as indicated during post-op period) Do you need assistance with eating?: No Do you have difficulty maintaining continence: No Do you need assistance with getting out of bed/getting out of a chair/moving?: No Do you have difficulty managing or taking your medications?: Yes (daughters supervising/ assisting as indicated during post-op period; reports home health nurse came today and reviewed medications and assisted with filling weekly pill box)  Follow up appointments reviewed: PCP Follow-up appointment confirmed?: Yes Date of PCP follow-up appointment?: 03/23/24 Follow-up Provider: New PCP appointment scheduled Specialist Hospital Follow-up appointment confirmed?: Yes Date of Specialist follow-up appointment?: 03/02/24 Follow-Up Specialty Provider:: cardiology provider 03/02/24; TCTS provider 03/06/24 Do you need transportation to your follow-up appointment?: No Do you understand care options if your condition(s) worsen?: Yes-patient verbalized understanding  SDOH Interventions Today    Flowsheet Row Most  Recent Value  SDOH Interventions   Food Insecurity Interventions Intervention Not Indicated  Housing Interventions Intervention Not Indicated  Transportation Interventions Intervention Not Indicated  [drives self at baseline,  family assisting with transportation needs during post-op period]  Utilities Interventions Intervention Not Indicated      Interventions Today    Flowsheet Row Most Recent Value  Chronic Disease   Chronic disease during today's visit Other  [CAD/ NSTEMI/ CABG x 3]  General Interventions   General Interventions  Discussed/Reviewed General Interventions Discussed, Durable Medical Equipment (DME), Doctor Visits  Doctor Visits Discussed/Reviewed Doctor Visits Discussed, PCP, Specialist  [noted has new patient appointment with New PCP 03/23/24]  Durable Medical Equipment (DME) Dan Humphreys, Oxygen  [confirmed currently requiring/ using assistive devices for ambulation - walker,  prior to recent surgery- did not use assistive devices]  PCP/Specialist Visits Compliance with follow-up visit  Exercise Interventions   Exercise Discussed/Reviewed Exercise Discussed  [benefit of conservative post-op activity,  need to pace activity without over-doing- confirmed patient is ambulating frequently around home as instructed at time of hospital discharge]  Education Interventions   Education Provided Provided Education  Provided Verbal Education On Other  [safe use of home O2,  driving restrictions post- surgery]  Nutrition Interventions   Nutrition Discussed/Reviewed Nutrition Discussed  Pharmacy Interventions   Pharmacy Dicussed/Reviewed Pharmacy Topics Discussed  Safety Interventions   Safety Discussed/Reviewed Safety Discussed, Fall Risk  [provided education/ reinforcement around fall prevention]      TOC Interventions Today    Flowsheet Row Most Recent Value  TOC Interventions   TOC Interventions Discussed/Reviewed TOC Interventions Discussed  [Patient declines need for ongoing/ further care management/ coordination outreach,  declines enrollment in 30-day TOC program]      Total time spent from review to signing of note/ including any care coordination interventions:  47 minutes  Pls call/ message for questions,  Caryl Pina, RN, BSN, Media planner  Transitions of Care  [facility] [phone]: direct office

## 2024-02-22 NOTE — Progress Notes (Signed)
 [address]       [place] [address]             [phone]    HPI:  Meghan Lynn is a 73 yo AA female with history of GERD, Esophageal Stricture with recent failed dilation attempts, PUD, HTN, Dyslipidemia, Nicotine Abuse, and known CAD S/p multiple stents placement in the past.  She ultimately underwent catheterization and developed chest pain requiring IABP.  She underwent CABG x 4 on 02/10/2024.  Her hospital course was complicated by an AKI, her medication regimen was optimized by AHF team, and she was unable to wean off oxygen and ultimately required home use of 2L via [place].  She presents today for post operative follow up.  The patient states that is she isn't doing well.  She continues to have a lot of pain.  This is mainly along her back.  She also notes that she has burning type sensation on her thighs and groin area.  She is ambulating several times per day.  She states she did her exercises and she did not require oxygen and her sats were above 90.  She is scheduled to be evaluated again on Thursday and is hoping to be able to get rid of her oxygen.    Current Outpatient Medications  Medication Sig Dispense Refill   albuterol (VENTOLIN HFA) 108 (90 Base) MCG/ACT inhaler Inhale 2 puffs into the lungs every 6 (six) hours as needed for wheezing or shortness of breath. 1 each 6   aspirin EC 81 MG tablet Take 1 tablet (81 mg total) by mouth daily. Swallow whole.     Bempedoic Acid-Ezetimibe (NEXLIZET) 180-10 MG TABS Take 1 tablet by mouth at bedtime. 90 tablet 3   Biotin 1 MG CAPS Take 1 mg by mouth daily.      carvedilol (COREG) 3.125 MG tablet Take 1 tablet (3.125 mg total) by mouth 2 (two) times daily with a meal. 60 tablet 3   clopidogrel (PLAVIX) 75 MG tablet Take 1 tablet (75 mg total) by mouth daily. 30 tablet 3   dapagliflozin propanediol (FARXIGA) 10 MG TABS tablet Take 1 tablet (10 mg total) by mouth daily. 30 tablet 3   feeding supplement (ENSURE ENLIVE /  ENSURE PLUS) LIQD Take 237 mLs by mouth 3 (three) times daily with meals.     furosemide (LASIX) 40 MG tablet Take 1 tablet (40 mg total) by mouth daily. X 7 days, then transition to daily as needed for weight gain of 3-5 lbs in 24-48 hours 30 tablet 1   Multiple Vitamin (MULTIVITAMIN WITH MINERALS) TABS Take 1 tablet by mouth daily.     nitroGLYCERIN (NITROSTAT) 0.4 MG SL tablet Place 1 tablet (0.4 mg total) under the tongue every 5 (five) minutes as needed for chest pain. 25 tablet 3   omeprazole (PRILOSEC) 20 MG capsule Take 20 mg by mouth daily.     oxyCODONE (OXY IR/ROXICODONE) 5 MG immediate release tablet Take 1 tablet (5 mg total) by mouth every 4 (four) hours as needed for severe pain (pain score 7-10). 30 tablet 0   potassium chloride SA (KLOR-CON M) 20 MEQ tablet Take 1 tablet (20 mEq total) by mouth daily. X 7 days, then use only on days you take Lasix 30 tablet 3   predniSONE (DELTASONE) 10 MG tablet Take 1 tablet (10 mg total) by mouth daily before breakfast. 4 tablet 0   spironolactone (ALDACTONE) 25 MG tablet Take 0.5 tablets (  12.5 mg total) by mouth daily. 30 tablet 3   umeclidinium-vilanterol (ANORO ELLIPTA) 62.5-25 MCG/ACT AEPB Inhale 1 puff into the lungs daily. 60 each 11   No current facility-administered medications for this visit.    Physical Exam:  BP (!) 144/85 (BP Location: Left Arm, Patient Position: Sitting, Cuff Size: Normal)   Pulse 87   Resp 20   Ht 4\' 9"  (1.448 m)   Wt 135 lb 6.4 oz (61.4 kg)   SpO2 100% Comment: 2LNC  BMI 29.30 kg/m   Gen: NAD, looks great Heart: RRR Lungs: CTA bilaterally Ext: no edema Incisions: well healed  Diagnostic Tests:  CXR: looks good, resolution of previous pleural effusion  A/P:  S/P CABG x 4 performed by Dr. Dorris Fetch.[name] Kitchen overall she is doing very well Pulm- resolution of previous pleural effusions.. she has been able to perform exercises without oxygen at home... if she remains above 90% on Thursday she will  contact office to send RX to cancel Pain control- patient describes muscle and neuropathic pain symptoms.  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 will try Robaxin prn and Gabapentin.[name] Kitchenas she states oxycodone isn't help Cardiac Rehab- encouraged to enroll once home health has completed Activity- continue to increase ambulation as tolerated.. may resume driving if feels up to and is not taking narcotics and Gabapentin doesn't cause drowsiness.. sternal precautions as discussed   RTC in 2 weeks to assess progress and pain control  Lowella Dandy, PA-C [facility] [phone]

## 2024-03-01 ENCOUNTER — Other Ambulatory Visit (HOSPITAL_COMMUNITY): Payer: Self-pay

## 2024-03-02 ENCOUNTER — Encounter: Payer: Self-pay | Admitting: Physician Assistant

## 2024-03-02 ENCOUNTER — Ambulatory Visit: Payer: Medicare HMO | Attending: Physician Assistant | Admitting: Physician Assistant

## 2024-03-02 VITALS — BP 118/72 | HR 85 | Ht <= 58 in | Wt 135.2 lb

## 2024-03-02 DIAGNOSIS — I10 Essential (primary) hypertension: Secondary | ICD-10-CM | POA: Diagnosis not present

## 2024-03-02 DIAGNOSIS — R200 Anesthesia of skin: Secondary | ICD-10-CM

## 2024-03-02 DIAGNOSIS — I25810 Atherosclerosis of coronary artery bypass graft(s) without angina pectoris: Secondary | ICD-10-CM | POA: Diagnosis not present

## 2024-03-02 DIAGNOSIS — R079 Chest pain, unspecified: Secondary | ICD-10-CM | POA: Diagnosis not present

## 2024-03-02 DIAGNOSIS — E785 Hyperlipidemia, unspecified: Secondary | ICD-10-CM | POA: Diagnosis not present

## 2024-03-02 DIAGNOSIS — Z79899 Other long term (current) drug therapy: Secondary | ICD-10-CM

## 2024-03-02 NOTE — Progress Notes (Signed)
 Cardiology Office Note:  .   Date:  03/04/2024  ID:  Meghan Lynn, DOB [birth year], MRN [id number] PCP: Patient, No Pcp Per  [facility] Providers Cardiologist:  Chrystie Nose, MD     History of Present Illness: Meghan Lynn Kitchen   Meghan Lynn is a 73 y.o. female with past medical history of CAD, hypertension, hyperlipidemia, claudication despite normal ABI, asthma, GERD, COPD, and tobacco abuse.  She had a cardiac catheterization in March 2013 that showed moderate single-vessel CAD in the mid RCA, medical therapy was recommended.  Repeat cardiac catheterization in September 2014 showed significant RCA disease treated with DES x 3.  Myoview in October 2017 was normal.    She was seen by Jari Favre, PA-C on 02/03/2024 at which time she was having episode of chest discomfort.  Lexiscan Myoview was ordered.  Unfortunately, before the Lexiscan was able to be done, she presented to 02/06/2024 with worsening chest discomfort.  Lactic acid was elevated 4.6.  Serial troponin was elevated.  She was admitted with NSTEMI.  [facility] Dr. Bosie Clos consulted for solid food dysphagia which has been present for longer than 6 months, there was no previous evidence of esophageal stricture on barium swallow.  GI recommended conservative management and hold off on endoscopy.  Echocardiogram on 02/08/2024 showed EF 45 to 50%, wall motion abnormality suggestive of LAD territory ischemia, grade 1 DD, normal RV, trivial MR.  Cardiac catheterization performed on 02/08/2024 showed 100% proximal to mid distal RCA occlusion, 99% distal LAD lesion, 80% proximal to mid left circumflex lesion, 70% mid to distal left circumflex lesion, 90% distal left circumflex lesion.  I intra-aortic balloon pump was placed during the procedure and the patient was rendered chest pain-free.  Patient's care was subsequently transferred to heart failure service in the ICU.  She was seen by Dr. Dorris Fetch on 02/09/2024 and was agreeable to undergo CABG.   She underwent CABG x 3 with LIMA-LAD, SVG-OM, and SVG-PDA on 2/14.  Intra-aortic balloon pump was removed on 2/15.  She had expected volume overload after CABG and required IV diuresis.  Postprocedure, she was placed on aspirin, Plavix, carvedilol, Farxiga and the spironolactone.  Valsartan was restarted at 80 mg daily with plan to uptitrate as outpatient.  It was also recommended to refer the patient to lipid clinic for consideration of PCSK9 inhibitor.  Patient presents today for follow-up.  She has a portable oxygen concentrator, currently on 2 L oxygen.  She has multiple issues since her discharge.  Her sternotomy site is still very painful, and she sometimes hear a clicking sound.  She has chest x-ray next Tuesday before follow-up with Dr. Sunday Corn office.  The issue sounds like has to do with the healing of the sternotomy site.  Otherwise, she does not have any evidence of infection, drainage or bleeding from the sternotomy site.  She also complains of bilateral upper thigh numbness.  She underwent a right femoral approach for her cardiac cath.  Her vein harvesting site is also in the right leg.  Since this symptom is bilateral, I do not think this is related to the cardiac catheterization.  She denies any pain in the right groin cath site.  There was no pain on palpation either.  I discussed the case with Dr. Rennis Golden, we will do a aortoiliac Doppler to make sure there is no issue with her distal aorta and iliac artery.  Otherwise, she can follow-up with Dr. Rennis Golden in 4 months.  ROS:  Patient complains of pain at the sternotomy site and numbness in bilateral thigh.  She has shortness of breath.  Studies Reviewed: .         Cardiac Studies & Procedures   ______________________________________________________________________________________________ CARDIAC CATHETERIZATION  CARDIAC CATHETERIZATION 02/08/2024  Narrative   Prox RCA to Dist RCA lesion is 100% stenosed.   Dist LAD lesion is 99%  stenosed.   Prox Cx to Mid Cx lesion is 80% stenosed.   Mid Cx to Dist Cx lesion is 70% stenosed.   Dist Cx lesion is 90% stenosed.  1.  Severe multivessel disease with TIMI II flow in the distal LAD.  Due to ongoing chest pain, an intra-aortic balloon pump was placed and the patient was rendered chest pain-free. 2.  LVEDP of 13 mmHg.  Recommendation: The case was discussed with Dr. Shirlee Latch who will assume care.  The patient will be transferred to the 2 heart unit and a cardiothoracic surgical consult will be obtained.  Findings Coronary Findings Diagnostic  Dominance: Right  Left Anterior Descending Dist LAD lesion is 99% stenosed.  Left Circumflex Prox Cx to Mid Cx lesion is 80% stenosed. Mid Cx to Dist Cx lesion is 70% stenosed. Dist Cx lesion is 90% stenosed.  Right Coronary Artery Prox RCA to Dist RCA lesion is 100% stenosed. The lesion was previously treated .  Third Right Posterolateral Branch Collaterals 3rd RPL filled by collaterals from 3rd Mrg.  Collaterals 3rd RPL filled by collaterals from 3rd Mrg.  Intervention  No interventions have been documented.   STRESS TESTS  MYOCARDIAL PERFUSION IMAGING 10/08/2016  Narrative  The left ventricular ejection fraction is hyperdynamic (>65%).  Nuclear stress EF: 67%.  There was no ST segment deviation noted during stress.  No T wave inversion was noted during stress.  The study is normal.  Low risk stress nuclear study with normal perfusion and normal left ventricular regional and global systolic function.   ECHOCARDIOGRAM  ECHOCARDIOGRAM COMPLETE 02/08/2024  Narrative ECHOCARDIOGRAM REPORT    Patient Name:   Meghan Lynn Date of Exam: 02/08/2024 Medical Rec #:  [id number]       Height:       61.0 in Accession #:    [id number]      Weight:       137.8 lb Date of Birth:  [birth year]       BSA:          1.612 m Patient Age:    72 years        BP:           149/94 mmHg Patient Gender: F                HR:           85 bpm. Exam Location:  Inpatient  Procedure: 2D Echo, Cardiac Doppler and Color Doppler (Both Spectral and Color Flow Doppler were utilized during procedure).  Indications:    Chest Pain R07.9  History:        Patient has no prior history of Echocardiogram examinations.  Sonographer:    Harriette Bouillon RDCS Referring Phys: [id number] Cyndi Bender  IMPRESSIONS   1. Left ventricular ejection fraction, by estimation, is 45 to 50%. Left ventricular ejection fraction by 2D MOD biplane is 49.4 %. The left ventricle has mildly decreased function. The left ventricle demonstrates regional wall motion abnormalities (see scoring diagram/findings for description). Left ventricular diastolic parameters are consistent with Grade I diastolic dysfunction (impaired relaxation). There is severe hypokinesis  of the left ventricular, mid-apical anteroseptal wall, apical segment and anterior wall. The base of the heart is hyperdynamic. Consider LAD territory disease vs. Takatsubo cardiomyopathy. 2. Right ventricular systolic function is normal. The right ventricular size is normal. 3. The mitral valve is grossly normal. Trivial mitral valve regurgitation. 4. The aortic valve is tricuspid. Aortic valve regurgitation is not visualized. No aortic stenosis is present.  Comparison(s): No prior Echocardiogram.  Conclusion(s)/Recommendation(s): Findings suggest LAD territory ischemia -probably mid-distal disease vs possibly Takatsubo cardiomyopathy.  FINDINGS Left Ventricle: Left ventricular ejection fraction, by estimation, is 45 to 50%. Left ventricular ejection fraction by 2D MOD biplane is 49.4 %. The left ventricle has mildly decreased function. The left ventricle demonstrates regional wall motion abnormalities. Severe hypokinesis of the left ventricular, mid-apical anteroseptal wall, apical segment and anterior wall. Strain imaging was not performed. The left ventricular internal cavity size was normal  in size. There is no left ventricular hypertrophy. Left ventricular diastolic parameters are consistent with Grade I diastolic dysfunction (impaired relaxation). Normal left ventricular filling pressure.   LV Wall Scoring: The mid and distal lateral wall, mid and distal anterior septum, and entire apex are hypokinetic. The base of the heart is hyperdynamic. Consider LAD territory disease vs. Takatsubo cardiomyopathy.  Right Ventricle: The right ventricular size is normal. No increase in right ventricular wall thickness. Right ventricular systolic function is normal.  Left Atrium: Left atrial size was normal in size.  Right Atrium: Right atrial size was normal in size.  Pericardium: There is no evidence of pericardial effusion.  Mitral Valve: The mitral valve is grossly normal. Trivial mitral valve regurgitation.  Tricuspid Valve: The tricuspid valve is grossly normal. Tricuspid valve regurgitation is trivial.  Aortic Valve: The aortic valve is tricuspid. Aortic valve regurgitation is not visualized. No aortic stenosis is present.  Pulmonic Valve: The pulmonic valve was not well visualized. Pulmonic valve regurgitation is not visualized.  Aorta: The aortic root and ascending aorta are structurally normal, with no evidence of dilitation.  Venous: The inferior vena cava was not well visualized.  IAS/Shunts: No atrial level shunt detected by color flow Doppler.  Additional Comments: 3D imaging was not performed.   LEFT VENTRICLE PLAX 2D                        Biplane EF (MOD) LVIDd:         3.70 cm         LV Biplane EF:   Left LVIDs:         2.50 cm                          ventricular LV PW:         1.00 cm                          ejection LV IVS:        1.00 cm                          fraction by LVOT diam:     1.90 cm                          2D MOD LV SV:         69  biplane is LV SV Index:   43                               49.4 %. LVOT  Area:     2.84 cm Diastology LV e' medial:    3.81 cm/s LV Volumes (MOD)               LV E/e' medial:  8.8 LV vol d, MOD    59.4 ml       LV e' lateral:   6.42 cm/s A2C:                           LV E/e' lateral: 5.2 LV vol d, MOD    57.9 ml A4C: LV vol s, MOD    34.4 ml A2C: LV vol s, MOD    29.2 ml A4C: LV SV MOD A2C:   25.0 ml LV SV MOD A4C:   57.9 ml LV SV MOD BP:    30.2 ml  RIGHT VENTRICLE RV S prime:     11.00 cm/s TAPSE (M-mode): 1.4 cm  LEFT ATRIUM             Index        RIGHT ATRIUM           Index LA diam:        3.10 cm 1.92 cm/m   RA Area:     10.60 cm LA Vol (A2C):   32.6 ml 20.22 ml/m  RA Volume:   21.00 ml  13.03 ml/m LA Vol (A4C):   33.6 ml 20.84 ml/m LA Biplane Vol: 34.0 ml 21.09 ml/m AORTIC VALVE LVOT Vmax:   121.00 cm/s LVOT Vmean:  72.200 cm/s LVOT VTI:    0.245 m  AORTA Ao Root diam: 2.70 cm Ao Asc diam:  2.90 cm  MITRAL VALVE MV Area (PHT): 5.00 cm    SHUNTS MV E velocity: 33.40 cm/s  Systemic VTI:  0.24 m MV A velocity: 68.60 cm/s  Systemic Diam: 1.90 cm MV E/A ratio:  0.49  Zoila Shutter MD Electronically signed by Zoila Shutter MD Signature Date/Time: 02/08/2024/3:44:39 PM    Final   TEE  ECHO INTRAOPERATIVE TEE 02/10/2024  Narrative *INTRAOPERATIVE TRANSESOPHAGEAL REPORT *    Patient Name:   Meghan Lynn Date of Exam: 02/10/2024 Medical Rec #:  [id number]       Height:       61.0 in Accession #:    [id number]      Weight:       137.8 lb Date of Birth:  10-14-1951       BSA:          1.61 m Patient Age:    72 years        BP:           160/66 mmHg Patient Gender: F               HR:           92 bpm. Exam Location:  Anesthesiology  Transesophogeal exam was perform intraoperatively during surgical procedure. Patient was closely monitored under general anesthesia during the entirety of examination.  Indications:     CAD Native Vessel i25.10 Sonographer:     Irving Burton Senior RDCS Performing Phys: [id number]  [place] Diagnosing Phys: Achille Rich MD  Complications: No known complications during this procedure. POST-OP IMPRESSIONS  Overall, there were no significant changes from pre-bypass.  PRE-OP FINDINGS Left Ventricle: The left ventricle has low normal systolic function, with an ejection fraction of 50-55%. The cavity size was normal. There is mildly increased left ventricular wall thickness. There is mild concentric left ventricular hypertrophy.   Right Ventricle: The right ventricle has normal systolic function. The cavity was normal. There is no increase in right ventricular wall thickness.  Left Atrium: Left atrial size was normal in size. No left atrial/left atrial appendage thrombus was detected.  Right Atrium: Right atrial size was normal in size.  Interatrial Septum: No atrial level shunt detected by color flow Doppler.  Pericardium: There is no evidence of pericardial effusion.  Mitral Valve: The mitral valve is normal in structure. Mitral valve regurgitation is not visualized by color flow Doppler.  Tricuspid Valve: The tricuspid valve was normal in structure. Tricuspid valve regurgitation is mild by color flow Doppler. No evidence of tricuspid stenosis is present.  Aortic Valve: The aortic valve is tricuspid Aortic valve regurgitation was not visualized by color flow Doppler. There is no stenosis of the aortic valve.   Pulmonic Valve: The pulmonic valve was normal in structure, with normal. Pulmonic valve regurgitation is trivial by color flow Doppler.   Aorta: Aortic balloon pump is in place. The tip of the balloon pump terminates in the descending portion of the aorta distal to the left subclavian artery.  +--------------+--------++ LEFT VENTRICLE         +--------------+--------++ PLAX 2D                +--------------+--------++ LVOT diam:    1.70 cm  +--------------+--------++ LVOT Area:    2.27 cm +--------------+--------++                         +--------------+--------++   +-------------+-------++ AORTA                +-------------+-------++ Ao Root diam:2.80 cm +-------------+-------++   +--------------+-------+ SHUNTS                +--------------+-------+ Systemic Diam:1.70 cm +--------------+-------+   Achille Rich MD Electronically signed by Achille Rich MD Signature Date/Time: 02/14/2024/8:13:16 AM    Final        ______________________________________________________________________________________________      Risk Assessment/Calculations:            Physical Exam:   VS:  BP 118/72 (BP Location: Left Arm, Patient Position: Sitting, Cuff Size: Normal)   Pulse 85   Ht 4\' 9"  (1.448 m)   Wt 135 lb 3.2 oz (61.3 kg)   SpO2 98%   BMI 29.26 kg/m    Wt Readings from Last 3 Encounters:  03/02/24 135 lb 3.2 oz (61.3 kg)  02/21/24 130 lb 14.4 oz (59.4 kg)  02/03/24 140 lb (63.5 kg)    GEN: Well nourished, well developed in no acute distress NECK: No JVD; No carotid bruits CARDIAC: RRR, no murmurs, rubs, gallops RESPIRATORY:  Clear to auscultation without rales, wheezing or rhonchi  ABDOMEN: Soft, non-tender, non-distended EXTREMITIES:  No edema; No deformity   ASSESSMENT AND PLAN: .    CAD s/p CABG -Recently underwent CABG x 3 with LIMA-LAD, SVG-OM and SVG-PDA on 02/10/2024.  No further anginal symptom. -Continue aspirin and Plavix  Chest pain Persistent sternotomy site pain and clicking sound. No evidence of infection, drainage, or bleeding from the sternotomy site. -Scheduled chest x-ray before follow-up with Dr. Dorris Fetch.  Bilateral Upper Thigh Numbness  Numbness in bilateral upper thighs post-cardiac catheterization. No pain in the groin or at the vein harvesting site. -Order aortoiliac Doppler to rule out issues with distal aorta and iliac artery.  Chronic shortness of breath -She was discharged on home oxygen, currently on 2 L/min. Patient on 2L oxygen  via portable concentrator  Hypertension: Continue carvedilol and spironolactone  Hyperlipidemia -Refer patient to lipid clinic for consideration of PCSK9 inhibitor.     Cardiac Rehabilitation Eligibility Assessment  The patient is ready to start cardiac rehabilitation pending clearance from the cardiac surgeon.       Dispo: Follow-up with Dr. Rennis Golden in 60-month  Signed, Azalee Course, [place]

## 2024-03-02 NOTE — Patient Instructions (Signed)
 Medication Instructions:  NO CHANGES *If you need a refill on your cardiac medications before your next appointment, please call your pharmacy*   Lab Work: CBC AND BMP TODAY If you have labs (blood work) drawn today and your tests are completely normal, you will receive your results only by: MyChart Message (if you have MyChart) OR A paper copy in the mail If you have any lab test that is abnormal or we need to change your treatment, we will call you to review the results.   Testing/Procedures: Your physician has requested that you have an Aorta/Iliac Duplex. This will be take place at [address] [facility] [address], [address].  No food after 11PM the night before.  Water is OK. (Don't drink liquids if you have been instructed not to for ANOTHER test) Avoid foods that produce bowel gas, for 24 hours prior to exam (see below). No breakfast, no chewing gum, no smoking or carbonated beverages. Patient may take morning medications with water. Come in for test at least 15 minutes early to register.  Please note: We ask at that you not bring children with you during ultrasound (echo/ vascular) testing. Due to room size and safety concerns, children are not allowed in the ultrasound rooms during exams. Our front office staff cannot provide observation of children in our lobby area while testing is being conducted. An adult accompanying a patient to their appointment will only be allowed in the ultrasound room at the discretion of the ultrasound technician under special circumstances. We apologize for any inconvenience.    Follow-Up: At [facility], you and your health needs are our priority.  As part of our continuing mission to provide you with exceptional heart care, we have created designated Provider Care Teams.  These Care Teams include your primary Cardiologist (physician) and Advanced Practice Providers (APPs -  Physician Assistants and Nurse Practitioners) who all work together to provide  you with the care you need, when you need it.  Your next appointment:   4 month(s)  Provider:   Chrystie Nose, MD   Other Instructions

## 2024-03-03 ENCOUNTER — Other Ambulatory Visit (HOSPITAL_COMMUNITY): Payer: Self-pay

## 2024-03-03 LAB — BASIC METABOLIC PANEL
BUN/Creatinine Ratio: 11 — ABNORMAL LOW (ref 12–28)
BUN: 11 mg/dL (ref 8–27)
CO2: 22 mmol/L (ref 20–29)
Calcium: 9.8 mg/dL (ref 8.7–10.3)
Chloride: 104 mmol/L (ref 96–106)
Creatinine, Ser: 0.98 mg/dL (ref 0.57–1.00)
Glucose: 80 mg/dL (ref 70–99)
Potassium: 4.2 mmol/L (ref 3.5–5.2)
Sodium: 141 mmol/L (ref 134–144)
eGFR: 61 mL/min/{1.73_m2} (ref >59)

## 2024-03-03 LAB — CBC
Hematocrit: 41.9 % (ref 34.0–46.6)
Hemoglobin: 12.9 g/dL (ref 11.1–15.9)
MCH: 25.1 pg — ABNORMAL LOW (ref 26.6–33.0)
MCHC: 30.8 g/dL — ABNORMAL LOW (ref 31.5–35.7)
MCV: 82 fL (ref 79–97)
Platelets: 425 10*3/uL (ref 150–450)
RBC: 5.13 x10E6/uL (ref 3.77–5.28)
RDW: 13.9 % (ref 11.7–15.4)
WBC: 9.8 10*3/uL (ref 3.4–10.8)

## 2024-03-05 ENCOUNTER — Ambulatory Visit: Payer: Medicare HMO | Admitting: Nurse Practitioner

## 2024-03-06 ENCOUNTER — Ambulatory Visit
Admission: RE | Admit: 2024-03-06 | Discharge: 2024-03-06 | Disposition: A | Discharge status: Home / Self Care | Source: Ambulatory Visit | Attending: Thoracic Surgery (Cardiothoracic Vascular Surgery) | Admitting: Thoracic Surgery (Cardiothoracic Vascular Surgery)

## 2024-03-06 ENCOUNTER — Other Ambulatory Visit: Payer: Self-pay | Admitting: Thoracic Surgery (Cardiothoracic Vascular Surgery)

## 2024-03-06 ENCOUNTER — Other Ambulatory Visit: Payer: Self-pay | Admitting: Physician Assistant

## 2024-03-06 ENCOUNTER — Ambulatory Visit (INDEPENDENT_AMBULATORY_CARE_PROVIDER_SITE_OTHER): Payer: Self-pay | Admitting: Physician Assistant

## 2024-03-06 ENCOUNTER — Telehealth (HOSPITAL_COMMUNITY): Payer: Self-pay

## 2024-03-06 VITALS — BP 144/85 | HR 87 | Resp 20 | Ht <= 58 in | Wt 135.4 lb

## 2024-03-06 DIAGNOSIS — Z951 Presence of aortocoronary bypass graft: Secondary | ICD-10-CM

## 2024-03-06 DIAGNOSIS — R918 Other nonspecific abnormal finding of lung field: Secondary | ICD-10-CM | POA: Diagnosis not present

## 2024-03-06 MED ORDER — GABAPENTIN 300 MG PO CAPS: 300.0000 mg | ORAL_CAPSULE | Freq: Every day | ORAL | 1 refills | Status: DC

## 2024-03-06 MED ORDER — METHOCARBAMOL 500 MG PO TABS: 500.0000 mg | ORAL_TABLET | Freq: Four times a day (QID) | ORAL | 1 refills | Status: DC | PRN

## 2024-03-06 NOTE — Telephone Encounter (Signed)
 Pt insurance is active and benefits verified through [facility]. Co-pay $40.00, DED $0.00/$0.00 met, out of pocket $4,150.00/$114.65 met, co-insurance 0%. No pre-authorization required. Passport, 03/06/24 @ 12:09AM, REF#[id number]   How many CR sessions are covered? (36 visits for TCR, 72 visits for ICR)72 Is this a lifetime maximum or an annual maximum? Annual Has the member used any of these services to date? No Is there a time limit (weeks/months) on start of program and/or program completion? No     Will contact patient to see if she is interested in the Cardiac Rehab Program. If interested, patient will need to complete follow up appt. Once completed, patient will be contacted for scheduling upon review by the RN Navigator.

## 2024-03-08 NOTE — Progress Notes (Deleted)
 [address]       [place] [address]             [phone]       HPI:  Meghan Lynn is a 73 yo AA female with history of GERD, Esophageal Stricture with recent failed dilation attempts, PUD, HTN, Dyslipidemia, Nicotine Abuse, and known CAD S/p multiple stents placement in the past.  She ultimately underwent catheterization and developed chest pain requiring IABP.  She underwent CABG x 4 on 02/10/2024.  Her hospital course was complicated by an AKI, her medication regimen was optimized by AHF team, and she was unable to wean off oxygen and ultimately required home use of 2L via [place].   She was last evaluated by me on 03/06/2024 at which time she was doing very well other than pain.  At that time I have her prescriptions for muscle relaxants and Gabapentin.  She presents today for follow up and further pain assessment.  Current Outpatient Medications  Medication Sig Dispense Refill   albuterol (VENTOLIN HFA) 108 (90 Base) MCG/ACT inhaler Inhale 2 puffs into the lungs every 6 (six) hours as needed for wheezing or shortness of breath. 1 each 6   aspirin EC 81 MG tablet Take 1 tablet (81 mg total) by mouth daily. Swallow whole.     Bempedoic Acid-Ezetimibe (NEXLIZET) 180-10 MG TABS Take 1 tablet by mouth at bedtime. 90 tablet 3   Biotin 1 MG CAPS Take 1 mg by mouth daily.      carvedilol (COREG) 3.125 MG tablet Take 1 tablet (3.125 mg total) by mouth 2 (two) times daily with a meal. 60 tablet 3   clopidogrel (PLAVIX) 75 MG tablet Take 1 tablet (75 mg total) by mouth daily. 30 tablet 3   dapagliflozin propanediol (FARXIGA) 10 MG TABS tablet Take 1 tablet (10 mg total) by mouth daily. (Patient not taking: Reported on 03/06/2024) 30 tablet 3   feeding supplement (ENSURE ENLIVE / ENSURE PLUS) LIQD Take 237 mLs by mouth 3 (three) times daily with meals.     furosemide (LASIX) 40 MG tablet Take 1 tablet (40 mg total) by mouth daily. X 7 days, then transition to daily as needed for  weight gain of 3-5 lbs in 24-48 hours 30 tablet 1   gabapentin (NEURONTIN) 300 MG capsule Take 1 capsule (300 mg total) by mouth at bedtime. X 3 days, then change to 300 mg twice daily 60 capsule 1   Multiple Vitamin (MULTIVITAMIN WITH MINERALS) TABS Take 1 tablet by mouth daily.     nitroGLYCERIN (NITROSTAT) 0.4 MG SL tablet Place 1 tablet (0.4 mg total) under the tongue every 5 (five) minutes as needed for chest pain. 25 tablet 3   omeprazole (PRILOSEC) 20 MG capsule Take 20 mg by mouth daily.     oxyCODONE (OXY IR/ROXICODONE) 5 MG immediate release tablet Take 1 tablet (5 mg total) by mouth every 4 (four) hours as needed for severe pain (pain score 7-10). 30 tablet 0   potassium chloride SA (KLOR-CON M) 20 MEQ tablet Take 1 tablet (20 mEq total) by mouth daily. X 7 days, then use only on days you take Lasix 30 tablet 3   predniSONE (DELTASONE) 10 MG tablet Take 1 tablet (10 mg total) by mouth daily before breakfast. 4 tablet 0   spironolactone (ALDACTONE) 25 MG tablet Take 0.5 tablets (12.5 mg total) by mouth daily. 30 tablet 3   tiZANidine (ZANAFLEX) 2 MG tablet Take 1  tablet (2 mg total) by mouth every 6 (six) hours as needed for muscle spasms. 90 tablet 1   umeclidinium-vilanterol (ANORO ELLIPTA) 62.5-25 MCG/ACT AEPB Inhale 1 puff into the lungs daily. 60 each 11   No current facility-administered medications for this visit.    Physical Exam: ***  Diagnostic Tests: ***  Impression: ***  Plan: ***   Lowella Dandy, PA-C [facility] [phone]

## 2024-03-12 ENCOUNTER — Ambulatory Visit (HOSPITAL_COMMUNITY)
Admission: RE | Admit: 2024-03-12 | Discharge: 2024-03-12 | Disposition: A | Discharge status: Home / Self Care | Source: Ambulatory Visit | Attending: Physician Assistant | Admitting: Physician Assistant

## 2024-03-12 DIAGNOSIS — R200 Anesthesia of skin: Secondary | ICD-10-CM | POA: Insufficient documentation

## 2024-03-19 ENCOUNTER — Ambulatory Visit

## 2024-03-20 ENCOUNTER — Ambulatory Visit (INDEPENDENT_AMBULATORY_CARE_PROVIDER_SITE_OTHER): Payer: Self-pay | Admitting: Thoracic Surgery (Cardiothoracic Vascular Surgery)

## 2024-03-20 VITALS — BP 166/96 | HR 93 | Resp 18 | Ht <= 58 in | Wt 136.0 lb

## 2024-03-20 DIAGNOSIS — J449 Chronic obstructive pulmonary disease, unspecified: Secondary | ICD-10-CM | POA: Diagnosis not present

## 2024-03-20 DIAGNOSIS — Z951 Presence of aortocoronary bypass graft: Secondary | ICD-10-CM

## 2024-03-20 NOTE — Progress Notes (Signed)
 [address]       [place] [address]             [phone]     HPI: Mrs. Meghan Lynn returns for scheduled follow-up after coronary bypass grafting  Meghan Lynn is a 73 year old woman with a history of CAD, stents, GERD, esophageal stricture, peptic ulcer disease, hypertension, anemia, and tobacco abuse.  She presented with unstable angina and ruled in for non-ST elevation MI.  She was found to have three-vessel disease with ejection fraction of 40%.  She had an intra-aortic balloon pump placed for refractory chest pain at the time of catheterization.  She underwent coronary bypass grafting x 3 on 02/10/2024.  Her balloon pump was removed the following day.  She had some acute kidney injury with a creatinine as high as 1.71 but that improved to baseline prior to discharge.  She was relatively slow to progress and required titration of medications.  She went home on postop day 11 on 2 L of oxygen.  She saw Lowella Dandy on 03/06/2024.  She was having some pain in the sternal area and was also having numbness and paresthesias in both thighs and hip areas.  She saw  Azalee Course of cardiology who did vascular ultrasound of the pelvis.  There was no evidence of venous thrombosis or significant arterial stenosis.  Continues to have some pain in the sternal area and also the numbness persists she thinks it may be a little worse than it was.  No recurrent anginal symptoms.  She tried 1 dose of gabapentin but had some pain around her sternal incision about 45 minutes later so she has not taken any more of that.  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.0)    cluster every day   Headache, migraine     daily   Hyperlipidemia    Hypertension    Peptic ulcer disease    Pneumonia    Shortness of breath dyspnea    with exertion    Current Outpatient Medications  Medication Sig Dispense Refill   aspirin EC 81 MG tablet Take 1 tablet (81 mg total) by mouth daily. Swallow whole.     Biotin 1 MG CAPS Take 1 mg by mouth daily.      clopidogrel (PLAVIX) 75 MG tablet Take 1 tablet (75 mg total) by mouth daily. 30 tablet 3   furosemide (LASIX) 40 MG tablet Take 1 tablet (40 mg total) by mouth daily. X 7 days, then transition to daily as needed for weight gain of 3-5 lbs in 24-48 hours 30 tablet 1   Multiple Vitamin (MULTIVITAMIN WITH MINERALS) TABS Take 1 tablet by mouth daily.     omeprazole (PRILOSEC) 20 MG capsule Take 20 mg by mouth daily.     potassium chloride SA (KLOR-CON M) 20 MEQ tablet Take 1 tablet (20 mEq total) by mouth daily. X 7 days, then use only on days you take Lasix 30 tablet 3   spironolactone (ALDACTONE) 25 MG tablet Take 0.5 tablets (12.5 mg total) by mouth daily. 30 tablet 3  tiZANidine (ZANAFLEX) 2 MG tablet Take 1 tablet (2 mg total) by mouth every 6 (six) hours as needed for muscle spasms. 90 tablet 1   umeclidinium-vilanterol (ANORO ELLIPTA) 62.5-25 MCG/ACT AEPB Inhale 1 puff into the lungs daily. 60 each 11   carvedilol (COREG) 3.125 MG tablet Take 1 tablet (3.125 mg total) by mouth 2 (two) times daily with a meal. (Patient not taking: Reported on 03/20/2024) 60 tablet 3   dapagliflozin propanediol (FARXIGA) 10 MG TABS tablet Take 1 tablet (10 mg total) by mouth daily. (Patient not taking: Reported on 03/20/2024) 30 tablet 3   nitroGLYCERIN (NITROSTAT) 0.4 MG SL tablet Place 1 tablet (0.4 mg total) under the tongue every 5 (five) minutes as needed for chest pain. 25 tablet 3   No current facility-administered medications for this visit.    Physical Exam BP (!) 166/96   Pulse 93   Resp 18   Ht 4\' 9"  (1.448 m)   Wt 136 lb (61.7 kg)   SpO2 99%   BMI 29.43 kg/m   Well-appearing 73 year old woman in no acute distress Alert and oriented x 3 no focal motor deficits Lungs clear with equal breath sounds bilaterally Cardiac regular rate and rhythm Sternum stable with tender to palpation, wound well-healed No peripheral edema  Diagnostic Tests: I reviewed her chest x-ray from 03/06/2024.  Postoperative changes.  No effusions or infiltrates.  Some chronic bronchitic changes.  Impression: Meghan Lynn is a 73 year old woman with a history of CAD, stents, GERD, esophageal stricture, peptic ulcer disease, hypertension, anemia, and tobacco abuse.  She presented with unstable angina and ruled in for non-ST elevation MI.  She was found to have three-vessel disease with ejection fraction of 40%.  She had an intra-aortic balloon pump placed for refractory chest pain at the time of catheterization.  She underwent coronary bypass grafting x 3 on 02/10/2024.    Three-vessel coronary disease-status post CABG x 3.  No recurrent anginal symptoms.  She does have some sternal incisional pain.  Taking Tylenol for that.  Pain is mostly along the costal cartilages and she may have some costochondritis.  She cannot take NSAIDs due to her GI issues.  That has been gradually improving.  She is about 5 weeks out from surgery.  Should avoid heavy lifting for another week or 2.  Beyond that her activities are unrestricted from my standpoint.  She had some medication confusion.  Apparently her home health nurse did not show up and she was not taking her Coreg or [place].  Her blood pressure is elevated today.  She has been checking it at home and it has been okay.  Recommended that she take her medications as prescribed and continue to monitor her blood pressure.  Numbness in both thighs and hip areas.  Some paresthesias.  Sounds more nerve related then vascular to me.  She has a history of back surgery.  I recommended she follow-up with Dr. Yetta Barre her surgeon to see if any imaging is  necessary.  Plan: Follow-up with Dr. Lynnette Lynn I will be happy to see her back if I can be of any further assistance with her care or if she has any issues related to her surgery.  [name], MD [facility] [phone]

## 2024-03-22 NOTE — Progress Notes (Unsigned)
 [facility] [address] [place] [address] Dept: [phone] Dept Fax: [phone]  New Patient Office Visit  Subjective:   Meghan Lynn [birth year] 03/23/2024  No chief complaint on file.   HPI: Meghan Lynn presents today to establish care at [facility]. Introduced to Publishing rights manager role and practice setting.  All questions answered.  Concerns: See below   Discussed the use of AI scribe software for clinical note transcription with the patient, who gave verbal consent to proceed.  History of Present Illness             The following portions of the patient's history were reviewed and updated as appropriate: past medical history, past surgical history, family history, social history, allergies, medications, and problem list.   Patient Active Problem List   Diagnosis Date Noted   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   NSTEMI (non-ST elevated myocardial infarction) (HCC) 02/06/2024   Erythrocytosis 02/06/2024   Abdominal pain 02/06/2024   Symptomatic anemia 06/21/2020   Lower GI bleed 06/20/2020   Dysuria 03/13/2020   H/O pyloric stenosis 01/31/2020   Acute pain of right wrist 01/21/2020   Weight gain 07/18/2018   Hair loss 07/18/2018   Screening for thyroid disorder 07/18/2018   Pneumonia    Peptic ulcer disease    Hypertension    Hyperlipidemia    Headache, migraine    Gastritis    Dilatation of esophagus    Coronary artery disease    Burning sensation of feet    Bronchitis due to tobacco use    Asthma    Arthritis    Anginal pain (HCC)    Weakness of both lower extremities 06/22/2017   Screening for abdominal aortic aneurysm 06/22/2017   Shortness of breath 09/29/2016   GERD (gastroesophageal reflux disease) 09/30/2015   Coronary artery disease due to lipid rich plaque 08/29/2014   HTN (hypertension) 08/29/2014   Dyslipidemia 08/29/2014   Smoker 08/29/2014    Spondylolisthesis of l3-4 01/27/2013   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.0)    cluster every day   Headache, migraine    daily   Hyperlipidemia    Hypertension    Peptic ulcer disease    Pneumonia    Shortness of breath dyspnea    with exertion   Past Surgical History:  Procedure Laterality Date   BACK SURGERY     BALLOON DILATION N/A 05/30/2013   Procedure: BALLOON DILATION;  Surgeon: Barrie Folk, MD;  Location: [facility] ENDOSCOPY;  Service: Endoscopy;  Laterality: N/A;   BALLOON DILATION N/A 08/02/2013   Procedure: BALLOON DILATION;  Surgeon: Barrie Folk, MD;  Location: [facility] ENDOSCOPY;  Service: Endoscopy;  Laterality: N/A;   BALLOON DILATION N/A 04/29/2016   Procedure: BALLOON DILATION;  Surgeon: Dorena Cookey, MD;  Location: [facility] ENDOSCOPY;  Service: Endoscopy;  Laterality: N/A;   BALLOON DILATION N/A 06/16/2020   Procedure: BALLOON DILATION;  Surgeon: Kerin Salen, MD;  Location: [facility] ENDOSCOPY;  Service: Gastroenterology;  Laterality: N/A;   BIOPSY  06/16/2020   Procedure: BIOPSY;  Surgeon: Kerin Salen, MD;  Location: [facility] ENDOSCOPY;  Service: Gastroenterology;;   CARDIAC CATHETERIZATION  03/07/2002   moderte CAD 80% (mid RCA) - Dr. Chanda Busing   CARDIAC CATHETERIZATION  09/13/2003   Cypher 2.5x63mm and 2.5x18 and Taxus 2.75x42mm to distal, mid, prox RCA (Dr. Laurell Josephs)   CARDIOLITE MYOCARDIAL PERFUSION STUDY  05/2006   positive bruce protocol, low risk, EF 80%   CARDIOVASCULAR STRESS TEST  2010   COLONOSCOPY     COLONOSCOPY WITH PROPOFOL N/A 06/16/2020   Procedure: COLONOSCOPY WITH PROPOFOL;  Surgeon: Kerin Salen, MD;  Location: [facility] ENDOSCOPY;  Service: Gastroenterology;  Laterality: N/A;   COLONOSCOPY  WITH PROPOFOL N/A 06/21/2020   Procedure: COLONOSCOPY WITH PROPOFOL;  Surgeon: Kathi Der, MD;  Location: [facility] ENDOSCOPY;  Service: Gastroenterology;  Laterality: N/A;   CORONARY ANGIOPLASTY  2004   stents x 3   CORONARY ARTERY BYPASS GRAFT N/A 02/10/2024   Procedure: CORONARY ARTERY BYPASS GRAFTING (CABG) TIMES THREE USING LEFT INTERNAL MAMMARY ARTERY AND ENDOSCOPICALLY HARVESTED RIGHT GREATER SAPHENOUS VEIN.;  Surgeon: [name], MD;  Location: [facility] OR;  Service: Open Heart Surgery;  Laterality: N/A;   DILATATION & CURETTAGE/HYSTEROSCOPY WITH MYOSURE N/A 08/19/2016   Procedure: DILATATION & CURETTAGE/HYSTEROSCOPY WITH MYOSURE;  Surgeon: Maxie Better, MD;  Location: [facility] ORS;  Service: Gynecology;  Laterality: N/A;   ESOPHAGOGASTRODUODENOSCOPY N/A 05/30/2013   Procedure: ESOPHAGOGASTRODUODENOSCOPY (EGD);  Surgeon: Barrie Folk, MD;  Location: [facility] ENDOSCOPY;  Service: Endoscopy;  Laterality: N/A;   ESOPHAGOGASTRODUODENOSCOPY N/A 08/02/2013   Procedure: ESOPHAGOGASTRODUODENOSCOPY (EGD);  Surgeon: Barrie Folk, MD;  Location: [facility] ENDOSCOPY;  Service: Endoscopy;  Laterality: N/A;  barb /ja   ESOPHAGOGASTRODUODENOSCOPY (EGD) WITH PROPOFOL N/A 04/29/2016   Procedure: ESOPHAGOGASTRODUODENOSCOPY (EGD) WITH PROPOFOL;  Surgeon: Dorena Cookey, MD;  Location: [facility] ENDOSCOPY;  Service: Endoscopy;  Laterality: N/A;   ESOPHAGOGASTRODUODENOSCOPY (EGD) WITH PROPOFOL N/A 06/16/2020   Procedure: ESOPHAGOGASTRODUODENOSCOPY (EGD) WITH PROPOFOL;  Surgeon: Kerin Salen, MD;  Location: [facility] ENDOSCOPY;  Service: Gastroenterology;  Laterality: N/A;   FRACTURE SURGERY     HEMOSTASIS CLIP PLACEMENT  06/21/2020   Procedure: HEMOSTASIS CLIP PLACEMENT;  Surgeon: Kathi Der, MD;  Location: [facility] ENDOSCOPY;  Service: Gastroenterology;;   IABP INSERTION Right 02/08/2024   Procedure: IABP Insertion;  Surgeon: Orbie Pyo, MD;  Location: [facility] INVASIVE CV LAB;  Service: Cardiovascular;  Laterality: Right;   LEFT HEART CATH AND  CORONARY ANGIOGRAPHY N/A 02/08/2024   Procedure: LEFT HEART CATH AND CORONARY ANGIOGRAPHY;  Surgeon: Orbie Pyo, MD;  Location: [facility] INVASIVE CV LAB;  Service: Cardiovascular;  Laterality: N/A;   ORIF ELBOW FRACTURE  2006   POLYPECTOMY  06/16/2020   Procedure: POLYPECTOMY;  Surgeon: Kerin Salen, MD;  Location: [facility] ENDOSCOPY;  Service: Gastroenterology;;   POLYPECTOMY  06/21/2020   Procedure: POLYPECTOMY;  Surgeon: Kathi Der, MD;  Location: [facility] ENDOSCOPY;  Service: Gastroenterology;;   TEE WITHOUT CARDIOVERSION N/A 02/10/2024   Procedure: TRANSESOPHAGEAL ECHOCARDIOGRAM (TEE);  Surgeon: [name], MD;  Location: [facility] OR;  Service: Open Heart Surgery;  Laterality: N/A;   TRANSTHORACIC ECHOCARDIOGRAM  07/2008   normal LV function, mild MR, mild TR, trace pulm valve regurg   Family History  Problem Relation Age of Onset   Diabetes Mother    CAD Mother        CABG   Bone cancer Father    Hypertension Father    Heart disease Brother    Kidney disease Sister    Cancer Sister    Diabetes Sister  Hyperlipidemia Sister    Hypertension Sister     Current Outpatient Medications:    aspirin EC 81 MG tablet, Take 1 tablet (81 mg total) by mouth daily. Swallow whole., Disp: , Rfl:    Biotin 1 MG CAPS, Take 1 mg by mouth daily. , Disp: , Rfl:    carvedilol (COREG) 3.125 MG tablet, Take 1 tablet (3.125 mg total) by mouth 2 (two) times daily with a meal. (Patient not taking: Reported on 03/20/2024), Disp: 60 tablet, Rfl: 3   clopidogrel (PLAVIX) 75 MG tablet, Take 1 tablet (75 mg total) by mouth daily., Disp: 30 tablet, Rfl: 3   dapagliflozin propanediol (FARXIGA) 10 MG TABS tablet, Take 1 tablet (10 mg total) by mouth daily. (Patient not taking: Reported on 03/20/2024), Disp: 30 tablet, Rfl: 3   furosemide (LASIX) 40 MG tablet, Take 1 tablet (40 mg total) by mouth daily. X 7 days, then transition to daily as needed for weight gain of 3-5 lbs in 24-48 hours, Disp: 30 tablet, Rfl: 1    Multiple Vitamin (MULTIVITAMIN WITH MINERALS) TABS, Take 1 tablet by mouth daily., Disp: , Rfl:    nitroGLYCERIN (NITROSTAT) 0.4 MG SL tablet, Place 1 tablet (0.4 mg total) under the tongue every 5 (five) minutes as needed for chest pain., Disp: 25 tablet, Rfl: 3   omeprazole (PRILOSEC) 20 MG capsule, Take 20 mg by mouth daily., Disp: , Rfl:    potassium chloride SA (KLOR-CON M) 20 MEQ tablet, Take 1 tablet (20 mEq total) by mouth daily. X 7 days, then use only on days you take Lasix, Disp: 30 tablet, Rfl: 3   spironolactone (ALDACTONE) 25 MG tablet, Take 0.5 tablets (12.5 mg total) by mouth daily., Disp: 30 tablet, Rfl: 3   tiZANidine (ZANAFLEX) 2 MG tablet, Take 1 tablet (2 mg total) by mouth every 6 (six) hours as needed for muscle spasms., Disp: 90 tablet, Rfl: 1   umeclidinium-vilanterol (ANORO ELLIPTA) 62.5-25 MCG/ACT AEPB, Inhale 1 puff into the lungs daily., Disp: 60 each, Rfl: 11 Allergies  Allergen Reactions   Atorvastatin Other (See Comments)    Muscle cramping   Latex Itching   Propoxyphene Nausea And Vomiting    Darvocet    ROS: A complete ROS was performed with pertinent positives/negatives noted in the HPI. The remainder of the ROS are negative.   Objective:   There were no vitals filed for this visit.  GENERAL: Well-appearing, in NAD. Well nourished.  SKIN: Pink, warm and dry. No rash, lesion, ulceration, or ecchymoses.  NECK: Trachea midline. Full ROM w/o pain or tenderness. No lymphadenopathy.  RESPIRATORY: Chest wall symmetrical. Respirations even and non-labored. Breath sounds clear to auscultation bilaterally.  CARDIAC: S1, S2 present, regular rate and rhythm. Peripheral pulses 2+ bilaterally.  MSK: Muscle tone and strength appropriate for age. Joints w/o tenderness, redness, or swelling.  EXTREMITIES: Without clubbing, cyanosis, or edema.  NEUROLOGIC: No motor or sensory deficits. Steady, even gait.  PSYCH/MENTAL STATUS: Alert, oriented x 3. Cooperative,  appropriate mood and affect.   Health Maintenance Due  Topic Date Due   Pneumonia Vaccine 67+ Years old (1 of 2 - PCV) Never done   Hepatitis C Screening  Never done   DTaP/Tdap/Td (1 - Tdap) Never done   Zoster Vaccines- Shingrix (1 of 2) Never done   Medicare Annual Wellness (AWV)  04/14/2021   INFLUENZA VACCINE  07/28/2023   COVID-19 Vaccine (6 - 2024-25 season) 08/28/2023    No results found for any visits on 03/23/24.  Assessment & Plan:  Assessment and Plan               There are no diagnoses linked to this encounter.  No orders of the defined types were placed in this encounter.  No orders of the defined types were placed in this encounter.   No follow-ups on file.   Salvatore Decent, FNP

## 2024-03-22 NOTE — Progress Notes (Unsigned)
 Called patient to ask if she saw another PCP after Dr. Leretha Pol and she stated no she did not and she does not remember the name of last eye doctor

## 2024-03-23 ENCOUNTER — Encounter: Payer: Self-pay | Admitting: Internal Medicine

## 2024-03-23 ENCOUNTER — Ambulatory Visit (INDEPENDENT_AMBULATORY_CARE_PROVIDER_SITE_OTHER): Payer: Medicare HMO | Admitting: Internal Medicine

## 2024-03-23 VITALS — BP 138/86 | HR 85 | Temp 98.1°F | Ht <= 58 in | Wt 136.6 lb

## 2024-03-23 DIAGNOSIS — I10 Essential (primary) hypertension: Secondary | ICD-10-CM | POA: Diagnosis not present

## 2024-03-23 DIAGNOSIS — I214 Non-ST elevation (NSTEMI) myocardial infarction: Secondary | ICD-10-CM

## 2024-03-23 DIAGNOSIS — E785 Hyperlipidemia, unspecified: Secondary | ICD-10-CM

## 2024-03-23 DIAGNOSIS — Z1159 Encounter for screening for other viral diseases: Secondary | ICD-10-CM | POA: Diagnosis not present

## 2024-03-23 DIAGNOSIS — J449 Chronic obstructive pulmonary disease, unspecified: Secondary | ICD-10-CM

## 2024-03-23 DIAGNOSIS — N179 Acute kidney failure, unspecified: Secondary | ICD-10-CM | POA: Diagnosis not present

## 2024-03-23 DIAGNOSIS — Z87891 Personal history of nicotine dependence: Secondary | ICD-10-CM

## 2024-03-23 DIAGNOSIS — Z951 Presence of aortocoronary bypass graft: Secondary | ICD-10-CM | POA: Diagnosis not present

## 2024-03-23 LAB — COMPREHENSIVE METABOLIC PANEL WITH GFR
ALT: 8 U/L (ref 0–35)
AST: 3 U/L (ref 0–37)
Albumin: 3.8 g/dL (ref 3.5–5.2)
Alkaline Phosphatase: 82 U/L (ref 39–117)
BUN: 9 mg/dL (ref 6–23)
CO2: 27 meq/L (ref 19–32)
Calcium: 9.4 mg/dL (ref 8.4–10.5)
Chloride: 106 meq/L (ref 96–112)
Creatinine, Ser: 0.89 mg/dL (ref 0.40–1.20)
GFR: 64.15 mL/min (ref >60.00)
Glucose, Bld: 104 mg/dL — ABNORMAL HIGH (ref 70–99)
Potassium: 3.5 meq/L (ref 3.5–5.1)
Sodium: 142 meq/L (ref 135–145)
Total Bilirubin: 0.3 mg/dL (ref 0.2–1.2)
Total Protein: 7.1 g/dL (ref 6.0–8.3)

## 2024-03-23 MED ORDER — ANORO ELLIPTA 62.5-25 MCG/ACT IN AEPB
1.0000 | INHALATION_SPRAY | Freq: Every day | RESPIRATORY_TRACT | 11 refills | Status: DC
Start: 2024-03-23 — End: 2024-07-03

## 2024-03-24 LAB — HEPATITIS C ANTIBODY: Hepatitis C Ab: NONREACTIVE

## 2024-03-26 ENCOUNTER — Other Ambulatory Visit (HOSPITAL_COMMUNITY): Payer: Self-pay

## 2024-03-26 ENCOUNTER — Encounter: Payer: Self-pay | Admitting: Internal Medicine

## 2024-03-27 ENCOUNTER — Telehealth: Payer: Self-pay

## 2024-03-27 NOTE — Telephone Encounter (Signed)
 Forwarding message below

## 2024-03-27 NOTE — Telephone Encounter (Signed)
 Copied from CRM [id number]. Topic: Clinical - Medical Advice >> Mar 27, 2024  9:14 AM Almira Coaster wrote: Reason for CRM: Ashly from [employer] is calling because they provide patient with oxygen; however, patient put in a request to discontinue the oxygen and they would like to verify if this is something FNP Salvatore Decent agrees with. Best call back number is [phone].

## 2024-03-28 NOTE — Telephone Encounter (Signed)
 Called and spoke with pt in regards to CR, pt stated she is not interested at this time.   Closed referral

## 2024-03-28 NOTE — Telephone Encounter (Signed)
**Note De-identified  [place] Obfuscation** Please advise 

## 2024-03-29 NOTE — Telephone Encounter (Signed)
 Looping in CT surgery in regards to d/c of oxygen.   Erin - at time of office visit to establish care with PCP, O2 sat was 97% RA, no visible respiratory distress at rest or with ambulation. Okay to discontinue?   Thanks,  Salvatore Decent FNP

## 2024-03-30 NOTE — Telephone Encounter (Signed)
 Okay to discontinue oxygen order. thanks

## 2024-04-15 ENCOUNTER — Other Ambulatory Visit: Payer: Self-pay | Admitting: Physician Assistant

## 2024-04-17 ENCOUNTER — Other Ambulatory Visit: Payer: Self-pay | Admitting: Physician Assistant

## 2024-04-19 ENCOUNTER — Other Ambulatory Visit: Payer: Self-pay | Admitting: Internal Medicine

## 2024-04-25 ENCOUNTER — Other Ambulatory Visit: Payer: Self-pay | Admitting: Internal Medicine

## 2024-05-01 DIAGNOSIS — M5416 Radiculopathy, lumbar region: Secondary | ICD-10-CM | POA: Diagnosis not present

## 2024-05-22 ENCOUNTER — Telehealth: Payer: Self-pay | Admitting: Internal Medicine

## 2024-05-22 DIAGNOSIS — N289 Disorder of kidney and ureter, unspecified: Secondary | ICD-10-CM | POA: Diagnosis not present

## 2024-05-22 DIAGNOSIS — M4726 Other spondylosis with radiculopathy, lumbar region: Secondary | ICD-10-CM | POA: Diagnosis not present

## 2024-05-22 DIAGNOSIS — M5416 Radiculopathy, lumbar region: Secondary | ICD-10-CM | POA: Diagnosis not present

## 2024-05-22 DIAGNOSIS — M48061 Spinal stenosis, lumbar region without neurogenic claudication: Secondary | ICD-10-CM | POA: Diagnosis not present

## 2024-05-22 DIAGNOSIS — M4807 Spinal stenosis, lumbosacral region: Secondary | ICD-10-CM | POA: Diagnosis not present

## 2024-05-22 MED ORDER — DAPAGLIFLOZIN PROPANEDIOL 10 MG PO TABS: 10.0000 mg | ORAL_TABLET | Freq: Every day | ORAL | 3 refills | Status: DC

## 2024-05-22 NOTE — Telephone Encounter (Signed)
 Pt's medication was sent to pt's pharmacy as requested. Confirmation received.

## 2024-05-22 NOTE — Telephone Encounter (Signed)
*  STAT* If patient is at the pharmacy, call can be transferred to refill team.   1. Which medications need to be refilled? (please list name of each medication and dose if known)   dapagliflozin  propanediol (FARXIGA ) 10 MG TABS tablet     4. Which pharmacy/location (including street and city if local pharmacy) is medication to be sent to?   [id number] [place], [place] - [address] Phone: [phone]  Fax: [phone]       5. Do they need a 30 day or 90 day supply? 90

## 2024-06-05 DIAGNOSIS — M5416 Radiculopathy, lumbar region: Secondary | ICD-10-CM | POA: Diagnosis not present

## 2024-06-08 DIAGNOSIS — K219 Gastro-esophageal reflux disease without esophagitis: Secondary | ICD-10-CM | POA: Diagnosis not present

## 2024-06-08 DIAGNOSIS — Z8249 Family history of ischemic heart disease and other diseases of the circulatory system: Secondary | ICD-10-CM | POA: Diagnosis not present

## 2024-06-08 DIAGNOSIS — R32 Unspecified urinary incontinence: Secondary | ICD-10-CM | POA: Diagnosis not present

## 2024-06-08 DIAGNOSIS — J439 Emphysema, unspecified: Secondary | ICD-10-CM | POA: Diagnosis not present

## 2024-06-08 DIAGNOSIS — Z87891 Personal history of nicotine dependence: Secondary | ICD-10-CM | POA: Diagnosis not present

## 2024-06-08 DIAGNOSIS — M1990 Unspecified osteoarthritis, unspecified site: Secondary | ICD-10-CM | POA: Diagnosis not present

## 2024-06-08 DIAGNOSIS — I509 Heart failure, unspecified: Secondary | ICD-10-CM | POA: Diagnosis not present

## 2024-06-08 DIAGNOSIS — I739 Peripheral vascular disease, unspecified: Secondary | ICD-10-CM | POA: Diagnosis not present

## 2024-06-08 DIAGNOSIS — I700 Atherosclerosis of aorta: Secondary | ICD-10-CM | POA: Diagnosis not present

## 2024-06-08 DIAGNOSIS — D696 Thrombocytopenia, unspecified: Secondary | ICD-10-CM | POA: Diagnosis not present

## 2024-06-08 DIAGNOSIS — I25119 Atherosclerotic heart disease of native coronary artery with unspecified angina pectoris: Secondary | ICD-10-CM | POA: Diagnosis not present

## 2024-06-08 DIAGNOSIS — I130 Hypertensive heart and chronic kidney disease with heart failure and stage 1 through stage 4 chronic kidney disease, or unspecified chronic kidney disease: Secondary | ICD-10-CM | POA: Diagnosis not present

## 2024-06-25 ENCOUNTER — Ambulatory Visit: Admitting: Internal Medicine

## 2024-06-25 DIAGNOSIS — R030 Elevated blood-pressure reading, without diagnosis of hypertension: Secondary | ICD-10-CM | POA: Diagnosis not present

## 2024-06-25 DIAGNOSIS — Z0000 Encounter for general adult medical examination without abnormal findings: Secondary | ICD-10-CM | POA: Diagnosis not present

## 2024-06-25 DIAGNOSIS — E559 Vitamin D deficiency, unspecified: Secondary | ICD-10-CM | POA: Diagnosis not present

## 2024-06-25 DIAGNOSIS — M129 Arthropathy, unspecified: Secondary | ICD-10-CM | POA: Diagnosis not present

## 2024-06-25 DIAGNOSIS — Z79899 Other long term (current) drug therapy: Secondary | ICD-10-CM | POA: Diagnosis not present

## 2024-06-25 DIAGNOSIS — M961 Postlaminectomy syndrome, not elsewhere classified: Secondary | ICD-10-CM | POA: Diagnosis not present

## 2024-06-25 DIAGNOSIS — Z131 Encounter for screening for diabetes mellitus: Secondary | ICD-10-CM | POA: Diagnosis not present

## 2024-07-03 ENCOUNTER — Encounter: Payer: Self-pay | Admitting: Internal Medicine

## 2024-07-03 ENCOUNTER — Ambulatory Visit (INDEPENDENT_AMBULATORY_CARE_PROVIDER_SITE_OTHER): Admitting: Internal Medicine

## 2024-07-03 VITALS — BP 146/88 | HR 77 | Temp 98.5°F | Ht <= 58 in | Wt 128.2 lb

## 2024-07-03 DIAGNOSIS — Z87891 Personal history of nicotine dependence: Secondary | ICD-10-CM | POA: Insufficient documentation

## 2024-07-03 DIAGNOSIS — E785 Hyperlipidemia, unspecified: Secondary | ICD-10-CM

## 2024-07-03 DIAGNOSIS — Z951 Presence of aortocoronary bypass graft: Secondary | ICD-10-CM

## 2024-07-03 DIAGNOSIS — I2510 Atherosclerotic heart disease of native coronary artery without angina pectoris: Secondary | ICD-10-CM | POA: Diagnosis not present

## 2024-07-03 DIAGNOSIS — M5416 Radiculopathy, lumbar region: Secondary | ICD-10-CM | POA: Insufficient documentation

## 2024-07-03 DIAGNOSIS — J449 Chronic obstructive pulmonary disease, unspecified: Secondary | ICD-10-CM | POA: Insufficient documentation

## 2024-07-03 DIAGNOSIS — I2583 Coronary atherosclerosis due to lipid rich plaque: Secondary | ICD-10-CM | POA: Diagnosis not present

## 2024-07-03 DIAGNOSIS — G43719 Chronic migraine without aura, intractable, without status migrainosus: Secondary | ICD-10-CM | POA: Diagnosis not present

## 2024-07-03 DIAGNOSIS — I10 Essential (primary) hypertension: Secondary | ICD-10-CM | POA: Diagnosis not present

## 2024-07-03 MED ORDER — FLUTICASONE-SALMETEROL 115-21 MCG/ACT IN AERO
2.0000 | INHALATION_SPRAY | Freq: Two times a day (BID) | RESPIRATORY_TRACT | 12 refills | Status: DC
Start: 2024-07-03 — End: 2024-07-10

## 2024-07-03 MED ORDER — ALBUTEROL SULFATE HFA 108 (90 BASE) MCG/ACT IN AERS: INHALATION_SPRAY | RESPIRATORY_TRACT | 1 refills | Status: DC

## 2024-07-03 NOTE — Progress Notes (Signed)
 [facility]ARE [facility] [address] [place] [address] Dept: [phone] Dept Fax: [phone]    Subjective:   Meghan Lynn [birth year] 07/03/2024  Chief Complaint  Patient presents with   Follow-up   Pain    In hip and thighs     HPI: AUTYMN OMLOR presents today for re-assessment and management of chronic medical conditions.  Discussed the use of AI scribe software for clinical note transcription with the patient, who gave verbal consent to proceed.  History of Present Illness   Meghan Lynn is a 73 year old female who presents for chronic condition follow up.   She is experiencing difficulty managing her medications following her CABG x3 in February 2025. Patient states her surgeon has not renewed some prescriptions, and she is uncertain about her current medication regimen. Some medications are too expensive, and she is unwilling to pay for them. She is unsure which medication is the costly one. She believes she is taking carvedilol  and thinks she is taking Plavix  and aspirin . She uses Lasix  only as needed and has not used nitroglycerin  since her surgery. However, she is completely unsure of all the medications she is currently taking. She did not bring her medications with her today.   She has a history of coronary artery disease and underwent bypass surgery in February. She is currently seeking a new cardiologist and is concerned about her cholesterol levels, as she has not restarted her cholesterol medication post-surgery (rosuvastatin ). She states this was discontinued at hospitalization.   She has COPD and reports significant coughing after using the Anoro Ellipta  inhaler, which she has stopped using. She experiences shortness of breath with exertion, such as walking or gardening, but not at rest. She practices deep breathing exercises at night and after walking in the morning. She continues smoking cessation - she has  now gone 6 months without any tobacco use.   She experiences daily headaches and migraines, which she has had for the last forty years. She wakes up with a headache every morning, and if it becomes severe, she receives a shot and sleeps it off. She has not had severe migraines in the last ten years. She has a history of using biofeedback for pain management. She reports headaches have been more severe in pain over the past month, possibly related to her blood pressure.   She reports hip, leg, and arm pain, with numbness in her thighs. She has been dealing with these pains daily, along with migraines. She saw her ortho spine specialist, dx with lumbar radiculopathy. Not a candidate for surgery due to recent CABG. Was referred to pain management, but she was not satisfied with the pain management team.        The following portions of the patient's history were reviewed and updated as appropriate: past medical history, past surgical history, family history, social history, allergies, medications, and problem list.   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   NSTEMI (non-ST elevated myocardial infarction) (HCC) 02/06/2024   Erythrocytosis 02/06/2024   Asthma 09/16/2022   Symptomatic anemia 06/21/2020   Peptic ulcer disease    Hyperlipidemia    Dilatation of esophagus    Burning sensation of feet    Bronchitis due to tobacco use    Arthritis    Weakness of  both lower extremities 06/22/2017   Shortness of breath 09/29/2016   GERD (gastroesophageal reflux disease) 09/30/2015   Coronary artery disease due to lipid rich plaque 08/29/2014   Essential hypertension 08/29/2014   Dyslipidemia 08/29/2014   Spondylolisthesis of l3-4 01/27/2013   Past Medical History:  Diagnosis Date   Active smoker    Anginal pain (HCC)    Arthritis     Asthma    patient denies   Bronchitis due to tobacco use    Burning sensation of feet    Coronary artery disease    3 stents to RCA   Dilatation of esophagus    Gastritis    GERD (gastroesophageal reflux disease)    H/O pyloric stenosis 01/31/2020   Headache(784.0)    cluster every day   Headache, migraine    daily   Hyperlipidemia    Hypertension    Lower GI bleed 06/20/2020   Peptic ulcer disease    Pneumonia    Shortness of breath dyspnea    with exertion   Past Surgical History:  Procedure Laterality Date   BACK SURGERY     BALLOON DILATION N/A 05/30/2013   Procedure: BALLOON DILATION;  Surgeon: Norleen JAYSON Hint, MD;  Location: [facility] ENDOSCOPY;  Service: Endoscopy;  Laterality: N/A;   BALLOON DILATION N/A 08/02/2013   Procedure: BALLOON DILATION;  Surgeon: Norleen JAYSON Hint, MD;  Location: [facility] ENDOSCOPY;  Service: Endoscopy;  Laterality: N/A;   BALLOON DILATION N/A 04/29/2016   Procedure: BALLOON DILATION;  Surgeon: Norleen Hint, MD;  Location: [facility] ENDOSCOPY;  Service: Endoscopy;  Laterality: N/A;   BALLOON DILATION N/A 06/16/2020   Procedure: BALLOON DILATION;  Surgeon: Saintclair Jasper, MD;  Location: [facility] ENDOSCOPY;  Service: Gastroenterology;  Laterality: N/A;   BIOPSY  06/16/2020   Procedure: BIOPSY;  Surgeon: Saintclair Jasper, MD;  Location: [facility] ENDOSCOPY;  Service: Gastroenterology;;   CARDIAC CATHETERIZATION  03/07/2002   moderte CAD 80% (mid RCA) - Dr. Elsie Ona   CARDIAC CATHETERIZATION  09/13/2003   Cypher 2.5x92mm and 2.5x18 and Taxus 2.75x60mm to distal, mid, prox RCA (Dr. FABIENE Hasten)   CARDIOLITE MYOCARDIAL PERFUSION STUDY  05/2006   positive bruce protocol, low risk, EF 80%   CARDIOVASCULAR STRESS TEST  2010   COLONOSCOPY     COLONOSCOPY WITH PROPOFOL  N/A 06/16/2020   Procedure: COLONOSCOPY WITH PROPOFOL ;  Surgeon: Saintclair Jasper, MD;  Location: [facility] ENDOSCOPY;  Service: Gastroenterology;  Laterality: N/A;   COLONOSCOPY WITH PROPOFOL  N/A 06/21/2020   Procedure: COLONOSCOPY WITH PROPOFOL ;   Surgeon: Elicia Claw, MD;  Location: [facility] ENDOSCOPY;  Service: Gastroenterology;  Laterality: N/A;   CORONARY ANGIOPLASTY  2004   stents x 3   CORONARY ARTERY BYPASS GRAFT N/A 02/10/2024   Procedure: CORONARY ARTERY BYPASS GRAFTING (CABG) TIMES THREE USING LEFT INTERNAL MAMMARY ARTERY AND ENDOSCOPICALLY HARVESTED RIGHT GREATER SAPHENOUS VEIN.;  Surgeon: Kerrin Elspeth JAYSON, MD;  Location: [facility] OR;  Service: Open Heart Surgery;  Laterality: N/A;   DILATATION & CURETTAGE/HYSTEROSCOPY WITH MYOSURE N/A 08/19/2016   Procedure: DILATATION & CURETTAGE/HYSTEROSCOPY WITH MYOSURE;  Surgeon: Dickie Carder, MD;  Location: [facility] ORS;  Service: Gynecology;  Laterality: N/A;   ESOPHAGOGASTRODUODENOSCOPY N/A 05/30/2013   Procedure: ESOPHAGOGASTRODUODENOSCOPY (EGD);  Surgeon: Norleen JAYSON Hint, MD;  Location: [facility] ENDOSCOPY;  Service: Endoscopy;  Laterality: N/A;   ESOPHAGOGASTRODUODENOSCOPY N/A 08/02/2013   Procedure: ESOPHAGOGASTRODUODENOSCOPY (EGD);  Surgeon: Norleen JAYSON Hint, MD;  Location: [facility] ENDOSCOPY;  Service: Endoscopy;  Laterality: N/A;  barb /ja   ESOPHAGOGASTRODUODENOSCOPY (EGD) WITH PROPOFOL  N/A 04/29/2016  Procedure: ESOPHAGOGASTRODUODENOSCOPY (EGD) WITH PROPOFOL ;  Surgeon: Norleen Hint, MD;  Location: [facility] ENDOSCOPY;  Service: Endoscopy;  Laterality: N/A;   ESOPHAGOGASTRODUODENOSCOPY (EGD) WITH PROPOFOL  N/A 06/16/2020   Procedure: ESOPHAGOGASTRODUODENOSCOPY (EGD) WITH PROPOFOL ;  Surgeon: Saintclair Jasper, MD;  Location: [facility] ENDOSCOPY;  Service: Gastroenterology;  Laterality: N/A;   FRACTURE SURGERY     HEMOSTASIS CLIP PLACEMENT  06/21/2020   Procedure: HEMOSTASIS CLIP PLACEMENT;  Surgeon: Elicia Claw, MD;  Location: [facility] ENDOSCOPY;  Service: Gastroenterology;;   IABP INSERTION Right 02/08/2024   Procedure: IABP Insertion;  Surgeon: Wendel Lurena POUR, MD;  Location: [facility] INVASIVE CV LAB;  Service: Cardiovascular;  Laterality: Right;   LEFT HEART CATH AND CORONARY ANGIOGRAPHY N/A 02/08/2024   Procedure: LEFT HEART CATH AND  CORONARY ANGIOGRAPHY;  Surgeon: Wendel Lurena POUR, MD;  Location: [facility] INVASIVE CV LAB;  Service: Cardiovascular;  Laterality: N/A;   ORIF ELBOW FRACTURE  2006   POLYPECTOMY  06/16/2020   Procedure: POLYPECTOMY;  Surgeon: Saintclair Jasper, MD;  Location: [facility] ENDOSCOPY;  Service: Gastroenterology;;   POLYPECTOMY  06/21/2020   Procedure: POLYPECTOMY;  Surgeon: Elicia Claw, MD;  Location: [facility] ENDOSCOPY;  Service: Gastroenterology;;   TEE WITHOUT CARDIOVERSION N/A 02/10/2024   Procedure: TRANSESOPHAGEAL ECHOCARDIOGRAM (TEE);  Surgeon: Kerrin Elspeth BROCKS, MD;  Location: [facility] OR;  Service: Open Heart Surgery;  Laterality: N/A;   TRANSTHORACIC ECHOCARDIOGRAM  07/2008   normal LV function, mild MR, mild TR, trace pulm valve regurg   Family History  Problem Relation Age of Onset   Diabetes Mother    CAD Mother        CABG   Bone cancer Father    Hypertension Father    Heart disease Brother    Kidney disease Sister    Cancer Sister    Diabetes Sister    Hyperlipidemia Sister    Hypertension Sister     Current Outpatient Medications:    albuterol  (VENTOLIN  HFA) 108 (90 Base) MCG/ACT inhaler, Inhale 2 puffs every 4-6 hours as needed for shortness of breath or wheezing, Disp: 18 g, Rfl: 1   aspirin  EC 81 MG tablet, Take 1 tablet (81 mg total) by mouth daily. Swallow whole., Disp: , Rfl:    Biotin 1 MG CAPS, Take 1 mg by mouth daily. , Disp: , Rfl:    carvedilol  (COREG ) 3.125 MG tablet, Take 1 tablet (3.125 mg total) by mouth 2 (two) times daily with a meal., Disp: 60 tablet, Rfl: 3   dapagliflozin  propanediol (FARXIGA ) 10 MG TABS tablet, Take 1 tablet (10 mg total) by mouth daily., Disp: 90 tablet, Rfl: 3   fluticasone -salmeterol (ADVAIR HFA) 115-21 MCG/ACT inhaler, Inhale 2 puffs into the lungs 2 (two) times daily., Disp: 1 each, Rfl: 12   furosemide  (LASIX ) 40 MG tablet, Take 1 tablet (40 mg total) by mouth daily. X 7 days, then transition to daily as needed for weight gain of 3-5 lbs in 24-48 hours,  Disp: 30 tablet, Rfl: 1   Multiple Vitamin (MULTIVITAMIN WITH MINERALS) TABS, Take 1 tablet by mouth daily., Disp: , Rfl:    nitroGLYCERIN  (NITROSTAT ) 0.4 MG SL tablet, Place 1 tablet (0.4 mg total) under the tongue every 5 (five) minutes as needed for chest pain., Disp: 25 tablet, Rfl: 3   omeprazole (PRILOSEC) 20 MG capsule, Take 20 mg by mouth daily., Disp: , Rfl:    potassium chloride  SA (KLOR-CON  M) 20 MEQ tablet, Take 1 tablet (20 mEq total) by mouth daily. X 7 days, then use only on days you take  Lasix , Disp: 30 tablet, Rfl: 3   spironolactone  (ALDACTONE ) 25 MG tablet, Take 0.5 tablets (12.5 mg total) by mouth daily., Disp: 30 tablet, Rfl: 3   tiZANidine (ZANAFLEX) 2 MG tablet, Take 1 tablet (2 mg total) by mouth every 6 (six) hours as needed for muscle spasms., Disp: 90 tablet, Rfl: 1   clopidogrel  (PLAVIX ) 75 MG tablet, Take 1 tablet (75 mg total) by mouth daily. (Patient not taking: Reported on 07/03/2024), Disp: 30 tablet, Rfl: 3 Allergies  Allergen Reactions   Atorvastatin  Other (See Comments)    Muscle cramping   Latex Itching   Propoxyphene Nausea And Vomiting    Darvocet     ROS: A complete ROS was performed with pertinent positives/negatives noted in the HPI. The remainder of the ROS are negative.    Objective:   Today's Vitals   07/03/24 1053 07/03/24 1131  BP: (!) 160/90 (!) 146/88  Pulse: 77   Temp: 98.5 F (36.9 C)   TempSrc: Oral   SpO2: 96%   Weight: 128 lb 3.2 oz (58.2 kg)   Height: 4' 9 (1.448 m)     GENERAL: Well-appearing, in NAD. Well nourished.  SKIN: Pink, warm and dry. No rash, lesion, ulceration, or ecchymoses. Well healed linear mid-sternal surgical incision without redness or drainage.  NECK: Trachea midline. Full ROM w/o pain or tenderness. No lymphadenopathy.  RESPIRATORY: Chest wall symmetrical. Respirations even and non-labored. Breath sounds clear to auscultation bilaterally.  CARDIAC: S1, S2 present, regular rate and rhythm. Peripheral  pulses 2+ bilaterally.  EXTREMITIES: Without clubbing, cyanosis, or edema.  NEUROLOGIC:Steady, even gait.  PSYCH/MENTAL STATUS: Alert, oriented x 3. Cooperative, appropriate mood and affect.   Health Maintenance Due  Topic Date Due   DTaP/Tdap/Td (1 - Tdap) Never done   Zoster Vaccines- Shingrix (1 of 2) Never done   Medicare Annual Wellness (AWV)  04/14/2021    No results found for any visits on 07/03/24.  The ASCVD Risk score (Arnett DK, et al., 2019) failed to calculate for the following reasons:   Risk score cannot be calculated because patient has a medical history suggesting prior/existing ASCVD     Assessment & Plan:  Assessment and Plan    Hypertension Hypertension with history of CABG. Uncertain adherence to carvedilol . Elevated blood pressure possibly causing headaches.  - Return for fasting blood work and nurse visit to verify medication list. - Adjust medications after reviewing blood work and current medication list. - Follow up in one week to discuss medication adjustments and blood pressure management.  Migraine Headaches Chronic migraines with daily headaches. Potentially exacerbated by elevated blood pressure. - Evaluate headache frequency and severity after blood pressure management. - Consider further intervention if headaches persist after blood pressure control.  Hyperlipidemia Hyperlipidemia, not on cholesterol medication post-CABG. Unable to check cholesterol levels today due to non-fasting state. - Return for fasting lipid panel. - Consider restarting rosuvastatin    Coronary Artery Disease (CAD) Status post CABG x3. Continuing follow-up with cardiology. Issues with medication management post-surgery. Seeking new cardiologist. - Continue follow-up with cardiology as scheduled. - Review and adjust cardiac medications after verifying current medication list.  Chronic Obstructive Pulmonary Disease (COPD) COPD with significant coughing after Anoro  Ellipta. Shortness of breath on exertion. Discussed alternative inhalers due to cost and side effects. - D/C Anoro Ellipta  - Prescribe Advair inhaler, two puffs twice a day, instruct to swish, gargle, and spit after use to prevent thrush. - Prescribe albuterol  inhaler as needed for shortness of breath.  Lumbar Radiculopathy Chronic lumbar radiculopathy with leg pain and numbness. Ongoing management with orthopedics and pain management. Dissatisfaction with current pain management provider. - Continue with orthopedic and pain management follow-up.  Former Tobacco Use Successful smoking cessation. - Congratulate on smoking cessation.       Patient will return this week for fasting lab work and a nurse visit for med reconciliation. At time of nurse visit, she will bring in her medications to verify what she is currently taking.   Orders Placed This Encounter  Procedures   Comprehensive metabolic panel with GFR    Standing Status:   Future    Expiration Date:   07/03/2025   Lipid panel    Standing Status:   Future    Expiration Date:   07/03/2025   CBC with Differential/Platelet    Standing Status:   Future    Expiration Date:   07/03/2025   No images are attached to the encounter or orders placed in the encounter. Meds ordered this encounter  Medications   albuterol  (VENTOLIN  HFA) 108 (90 Base) MCG/ACT inhaler    Sig: Inhale 2 puffs every 4-6 hours as needed for shortness of breath or wheezing    Dispense:  18 g    Refill:  1    Supervising Provider:   THOMPSON, AARON B [[id number]]   fluticasone -salmeterol (ADVAIR HFA) 115-21 MCG/ACT inhaler    Sig: Inhale 2 puffs into the lungs 2 (two) times daily.    Dispense:  1 each    Refill:  12    Supervising Provider:   SEBASTIAN BEVERLEY NOVAK [[id number]]    Return in about 1 week (around 07/10/2024) for Hypertension and medication adjustments.   Rosina Senters, FNP

## 2024-07-05 ENCOUNTER — Other Ambulatory Visit (INDEPENDENT_AMBULATORY_CARE_PROVIDER_SITE_OTHER)

## 2024-07-05 ENCOUNTER — Ambulatory Visit

## 2024-07-05 DIAGNOSIS — Z789 Other specified health status: Secondary | ICD-10-CM

## 2024-07-05 DIAGNOSIS — E785 Hyperlipidemia, unspecified: Secondary | ICD-10-CM | POA: Diagnosis not present

## 2024-07-05 DIAGNOSIS — I10 Essential (primary) hypertension: Secondary | ICD-10-CM

## 2024-07-05 LAB — COMPREHENSIVE METABOLIC PANEL WITH GFR
ALT: 12 U/L (ref 0–35)
AST: 19 U/L (ref 0–37)
Albumin: 4 g/dL (ref 3.5–5.2)
Alkaline Phosphatase: 83 U/L (ref 39–117)
BUN: 11 mg/dL (ref 6–23)
CO2: 31 meq/L (ref 19–32)
Calcium: 9.4 mg/dL (ref 8.4–10.5)
Chloride: 105 meq/L (ref 96–112)
Creatinine, Ser: 1.03 mg/dL (ref 0.40–1.20)
GFR: 53.73 mL/min — ABNORMAL LOW (ref >60.00)
Glucose, Bld: 104 mg/dL — ABNORMAL HIGH (ref 70–99)
Potassium: 3.8 meq/L (ref 3.5–5.1)
Sodium: 141 meq/L (ref 135–145)
Total Bilirubin: 0.4 mg/dL (ref 0.2–1.2)
Total Protein: 7.5 g/dL (ref 6.0–8.3)

## 2024-07-05 LAB — CBC WITH DIFFERENTIAL/PLATELET
Basophils Absolute: 0.1 K/uL (ref 0.0–0.1)
Basophils Relative: 0.7 % (ref 0.0–3.0)
Eosinophils Absolute: 0 K/uL (ref 0.0–0.7)
Eosinophils Relative: 0 % (ref 0.0–5.0)
HCT: 51.8 % — ABNORMAL HIGH (ref 36.0–46.0)
Hemoglobin: 16.1 g/dL — ABNORMAL HIGH (ref 12.0–15.0)
Lymphocytes Relative: 29.7 % (ref 12.0–46.0)
Lymphs Abs: 2.4 K/uL (ref 0.7–4.0)
MCHC: 31.1 g/dL (ref 30.0–36.0)
MCV: 77.4 fl — ABNORMAL LOW (ref 78.0–100.0)
Monocytes Absolute: 0.6 K/uL (ref 0.1–1.0)
Monocytes Relative: 7 % (ref 3.0–12.0)
Neutro Abs: 5 K/uL (ref 1.4–7.7)
Neutrophils Relative %: 62.6 % (ref 43.0–77.0)
Platelets: 267 K/uL (ref 150.0–400.0)
RBC: 6.69 Mil/uL — ABNORMAL HIGH (ref 3.87–5.11)
RDW: 17.1 % — ABNORMAL HIGH (ref 11.5–15.5)
WBC: 8 K/uL (ref 4.0–10.5)

## 2024-07-05 LAB — LIPID PANEL
Cholesterol: 228 mg/dL — ABNORMAL HIGH (ref 0–200)
HDL: 44.8 mg/dL (ref >39.00)
LDL Cholesterol: 148 mg/dL — ABNORMAL HIGH (ref 0–99)
NonHDL: 183.12
Total CHOL/HDL Ratio: 5
Triglycerides: 177 mg/dL — ABNORMAL HIGH (ref 0.0–149.0)
VLDL: 35.4 mg/dL (ref 0.0–40.0)

## 2024-07-05 NOTE — Progress Notes (Signed)
 Pt is here to review medications per the orders of Rosina Senters, NP. See 07/03/24 OV note. We reviewed medication list and how to take them. Pt has a few medicaatins hse is not taking due to not being able to receive a RF by the surgeon or unable to afford.   Pt is not taking dapagliflozin  propanediol (FARXIGA ) 10 MG TABS tablet - Cannot afford. Sending a message to Channing Mealing to contact pt to assist with applying for PAP or alternative medication.  Pt is out of clopidogrel  (PLAVIX ) 75 MG tablet. Need a RF. Unable to get RF from surgeon.  Pt not take tiZANidine (ZANAFLEX) 2 MG tablet. Reports it does not work. Wants to keep on med list in case a RF is needed.  Advised pt to wait for a call from [place] and to call or send a [facility] message to the office in the event she needs anything else. Pt agreeable.

## 2024-07-09 ENCOUNTER — Telehealth: Payer: Self-pay

## 2024-07-09 NOTE — Progress Notes (Signed)
 Complex Care Management Note  Care Guide Note 07/09/2024 Name: Meghan Lynn MRN: [id number] DOB: 1951/05/08  Meghan Lynn is a 73 y.o. year old female who sees Billy Knee, FNP for primary care. I reached out to [name] by phone today to offer complex care management services.  [name] was given information about Complex Care Management services today including:   The Complex Care Management services include support from the care team which includes your Nurse Care Manager, Clinical Social Worker, or Pharmacist.  The Complex Care Management team is here to help remove barriers to the health concerns and goals most important to you. Complex Care Management services are voluntary, and the patient may decline or stop services at any time by request to their care team member.   Complex Care Management Consent Status: Patient agreed to services and verbal consent obtained.   Follow up plan:  Telephone appointment with complex care management team member scheduled for:  07/18/24 at 9:00 a.m.   Encounter Outcome:  Patient Scheduled  Dreama [facility]  [facility], [facility] Care Management Assistant Direct Dial: [phone]  Fax: [phone]

## 2024-07-10 ENCOUNTER — Ambulatory Visit (INDEPENDENT_AMBULATORY_CARE_PROVIDER_SITE_OTHER): Admitting: Internal Medicine

## 2024-07-10 ENCOUNTER — Encounter: Payer: Self-pay | Admitting: Internal Medicine

## 2024-07-10 VITALS — BP 160/86 | HR 76 | Temp 98.1°F | Ht <= 58 in | Wt 129.0 lb

## 2024-07-10 DIAGNOSIS — E785 Hyperlipidemia, unspecified: Secondary | ICD-10-CM

## 2024-07-10 DIAGNOSIS — Z789 Other specified health status: Secondary | ICD-10-CM | POA: Diagnosis not present

## 2024-07-10 DIAGNOSIS — Z951 Presence of aortocoronary bypass graft: Secondary | ICD-10-CM | POA: Diagnosis not present

## 2024-07-10 DIAGNOSIS — I2510 Atherosclerotic heart disease of native coronary artery without angina pectoris: Secondary | ICD-10-CM | POA: Diagnosis not present

## 2024-07-10 DIAGNOSIS — I10 Essential (primary) hypertension: Secondary | ICD-10-CM

## 2024-07-10 DIAGNOSIS — I2583 Coronary atherosclerosis due to lipid rich plaque: Secondary | ICD-10-CM

## 2024-07-10 DIAGNOSIS — J449 Chronic obstructive pulmonary disease, unspecified: Secondary | ICD-10-CM | POA: Diagnosis not present

## 2024-07-10 MED ORDER — SPIRONOLACTONE 25 MG PO TABS: 12.5000 mg | ORAL_TABLET | Freq: Every day | ORAL | 1 refills | Status: AC

## 2024-07-10 MED ORDER — DAPAGLIFLOZIN PROPANEDIOL 10 MG PO TABS: 10.0000 mg | ORAL_TABLET | Freq: Every day | ORAL | 1 refills | Status: AC

## 2024-07-10 MED ORDER — WIXELA INHUB 100-50 MCG/ACT IN AEPB: 1.0000 | INHALATION_SPRAY | Freq: Two times a day (BID) | RESPIRATORY_TRACT | 3 refills | Status: DC

## 2024-07-10 MED ORDER — CLOPIDOGREL BISULFATE 75 MG PO TABS: 75.0000 mg | ORAL_TABLET | Freq: Every day | ORAL | 1 refills | Status: DC

## 2024-07-10 MED ORDER — CARVEDILOL 3.125 MG PO TABS: 6.2500 mg | ORAL_TABLET | Freq: Two times a day (BID) | ORAL | Status: DC

## 2024-07-10 NOTE — Patient Instructions (Signed)
 Keep check on your blood pressure at home . Goal is less than 130/80 consistently.  Check heart rate while taking Carvedilol : goal heart rate 60-100 beats per minute.

## 2024-07-10 NOTE — Progress Notes (Signed)
 [facility] [address] [place] [address] Dept: [phone] Dept Fax: [phone]    Subjective:   DILCIA RYBARCZYK 1951-05-22 07/10/2024  Chief Complaint  Patient presents with   Follow-up    HPI: Meghan Lynn presents today for re-assessment and management of chronic medical conditions.  Discussed the use of AI scribe software for clinical note transcription with the patient, who gave verbal consent to proceed.  History of Present Illness   KASIE LECCESE is a 73 year old female with hypertension and coronary artery disease who presents for medication management and blood pressure control.  She is experiencing confusion regarding her medication regimen due to conflicting instructions from different healthcare providers. Currently, she is taking carvedilol  3.125 mg twice daily and spironolactone  half a tablet once daily for BP control. There was a previous attempt to restart valsartan , but it was discontinued due to concerns about kidney function. Valsartan  was effective in controlling her blood pressure in the past per patient.   She is supposed to be taking Farxiga , but due to cost, is unable to get medication.   She experiences muscle cramping with rosuvastatin , similar to her reaction to atorvastatin , which led to an ambulance call due to severe cramps. She is not currently taking any statins due to these side effects.  Per cardiology records, it appears patient was started on Nexlizet , but has been discontinued. She has not seen lipid clinic to discuss PCSK9 Inhibitors.  She is not currently taking Plavix , although it was previously prescribed. She continues to take a baby aspirin  81 mg daily.   She is also on a multivitamin with iron.No hx of anemia.  She remains tobacco free now for 6 months post CABG.   She also mentions having an inhaler issue, where her insurance did not cover Advair. She uses albuterol   as needed for breathing issues.    The following portions of the patient's history were reviewed and updated as appropriate: past medical history, past surgical history, family history, social history, allergies, medications, and problem list.   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   NSTEMI (non-ST elevated myocardial infarction) (HCC) 02/06/2024   Erythrocytosis 02/06/2024   Asthma 09/16/2022   Symptomatic anemia 06/21/2020   Peptic ulcer disease    Hyperlipidemia    Dilatation of esophagus    Burning sensation of feet    Bronchitis due to tobacco use    Arthritis    Weakness of both lower extremities 06/22/2017   Shortness of breath 09/29/2016   GERD (gastroesophageal reflux disease) 09/30/2015   Coronary artery disease due to lipid rich plaque 08/29/2014   Essential hypertension 08/29/2014   Dyslipidemia 08/29/2014   Spondylolisthesis of l3-4 01/27/2013   Past Medical History:  Diagnosis Date   Active smoker    Anginal pain (HCC)    Arthritis    Asthma    patient denies   Bronchitis due to tobacco use    Burning sensation of feet    Coronary artery disease    3 stents to RCA   Dilatation of esophagus    Gastritis    GERD (gastroesophageal reflux disease)    H/O pyloric stenosis 01/31/2020   Headache(784.0)    cluster every day  Headache, migraine    daily   Hyperlipidemia    Hypertension    Lower GI bleed 06/20/2020   Peptic ulcer disease    Pneumonia    Shortness of breath dyspnea    with exertion   Past Surgical History:  Procedure Laterality Date   BACK SURGERY     BALLOON DILATION N/A 05/30/2013   Procedure: BALLOON DILATION;  Surgeon: Norleen JAYSON Hint, MD;  Location: [facility] ENDOSCOPY;  Service: Endoscopy;   Laterality: N/A;   BALLOON DILATION N/A 08/02/2013   Procedure: BALLOON DILATION;  Surgeon: Norleen JAYSON Hint, MD;  Location: [facility] ENDOSCOPY;  Service: Endoscopy;  Laterality: N/A;   BALLOON DILATION N/A 04/29/2016   Procedure: BALLOON DILATION;  Surgeon: Norleen Hint, MD;  Location: [facility] ENDOSCOPY;  Service: Endoscopy;  Laterality: N/A;   BALLOON DILATION N/A 06/16/2020   Procedure: BALLOON DILATION;  Surgeon: Saintclair Jasper, MD;  Location: [facility] ENDOSCOPY;  Service: Gastroenterology;  Laterality: N/A;   BIOPSY  06/16/2020   Procedure: BIOPSY;  Surgeon: Saintclair Jasper, MD;  Location: [facility] ENDOSCOPY;  Service: Gastroenterology;;   CARDIAC CATHETERIZATION  03/07/2002   moderte CAD 80% (mid RCA) - Dr. Elsie Ona   CARDIAC CATHETERIZATION  09/13/2003   Cypher 2.5x47mm and 2.5x18 and Taxus 2.75x58mm to distal, mid, prox RCA (Dr. FABIENE Hasten)   CARDIOLITE MYOCARDIAL PERFUSION STUDY  05/2006   positive bruce protocol, low risk, EF 80%   CARDIOVASCULAR STRESS TEST  2010   COLONOSCOPY     COLONOSCOPY WITH PROPOFOL  N/A 06/16/2020   Procedure: COLONOSCOPY WITH PROPOFOL ;  Surgeon: Saintclair Jasper, MD;  Location: [facility] ENDOSCOPY;  Service: Gastroenterology;  Laterality: N/A;   COLONOSCOPY WITH PROPOFOL  N/A 06/21/2020   Procedure: COLONOSCOPY WITH PROPOFOL ;  Surgeon: Elicia Claw, MD;  Location: [facility] ENDOSCOPY;  Service: Gastroenterology;  Laterality: N/A;   CORONARY ANGIOPLASTY  2004   stents x 3   CORONARY ARTERY BYPASS GRAFT N/A 02/10/2024   Procedure: CORONARY ARTERY BYPASS GRAFTING (CABG) TIMES THREE USING LEFT INTERNAL MAMMARY ARTERY AND ENDOSCOPICALLY HARVESTED RIGHT GREATER SAPHENOUS VEIN.;  Surgeon: Kerrin Elspeth JAYSON, MD;  Location: [facility] OR;  Service: Open Heart Surgery;  Laterality: N/A;   DILATATION & CURETTAGE/HYSTEROSCOPY WITH MYOSURE N/A 08/19/2016   Procedure: DILATATION & CURETTAGE/HYSTEROSCOPY WITH MYOSURE;  Surgeon: Dickie Carder, MD;  Location: [facility] ORS;  Service: Gynecology;  Laterality: N/A;    ESOPHAGOGASTRODUODENOSCOPY N/A 05/30/2013   Procedure: ESOPHAGOGASTRODUODENOSCOPY (EGD);  Surgeon: Norleen JAYSON Hint, MD;  Location: [facility] ENDOSCOPY;  Service: Endoscopy;  Laterality: N/A;   ESOPHAGOGASTRODUODENOSCOPY N/A 08/02/2013   Procedure: ESOPHAGOGASTRODUODENOSCOPY (EGD);  Surgeon: Norleen JAYSON Hint, MD;  Location: [facility] ENDOSCOPY;  Service: Endoscopy;  Laterality: N/A;  barb /ja   ESOPHAGOGASTRODUODENOSCOPY (EGD) WITH PROPOFOL  N/A 04/29/2016   Procedure: ESOPHAGOGASTRODUODENOSCOPY (EGD) WITH PROPOFOL ;  Surgeon: Norleen Hint, MD;  Location: [facility] ENDOSCOPY;  Service: Endoscopy;  Laterality: N/A;   ESOPHAGOGASTRODUODENOSCOPY (EGD) WITH PROPOFOL  N/A 06/16/2020   Procedure: ESOPHAGOGASTRODUODENOSCOPY (EGD) WITH PROPOFOL ;  Surgeon: Saintclair Jasper, MD;  Location: [facility] ENDOSCOPY;  Service: Gastroenterology;  Laterality: N/A;   FRACTURE SURGERY     HEMOSTASIS CLIP PLACEMENT  06/21/2020   Procedure: HEMOSTASIS CLIP PLACEMENT;  Surgeon: Elicia Claw, MD;  Location: [facility] ENDOSCOPY;  Service: Gastroenterology;;   IABP INSERTION Right 02/08/2024   Procedure: IABP Insertion;  Surgeon: Wendel Lurena POUR, MD;  Location: [facility] INVASIVE CV LAB;  Service: Cardiovascular;  Laterality: Right;   LEFT HEART CATH AND CORONARY ANGIOGRAPHY N/A 02/08/2024   Procedure: LEFT HEART CATH AND CORONARY ANGIOGRAPHY;  Surgeon: Thukkani, Arun K,  MD;  Location: [facility]VAS[facility] CV LAB;  Service: Cardiovascular;  Laterality: N/A;   ORIF ELBOW FRACTURE  2006   POLYPECTOMY  06/16/2020   Procedure: POLYPECTOMY;  Surgeon: Saintclair Jasper, MD;  Location: [facility] ENDOSCOPY;  Service: Gastroenterology;;   POLYPECTOMY  06/21/2020   Procedure: POLYPECTOMY;  Surgeon: Elicia Claw, MD;  Location: [facility] ENDOSCOPY;  Service: Gastroenterology;;   TEE WITHOUT CARDIOVERSION N/A 02/10/2024   Procedure: TRANSESOPHAGEAL ECHOCARDIOGRAM (TEE);  Surgeon: Kerrin Elspeth BROCKS, MD;  Location: [facility] OR;  Service: Open Heart Surgery;  Laterality: N/A;   TRANSTHORACIC ECHOCARDIOGRAM  07/2008   normal LV  function, mild MR, mild TR, trace pulm valve regurg   Family History  Problem Relation Age of Onset   Diabetes Mother    CAD Mother        CABG   Bone cancer Father    Hypertension Father    Heart disease Brother    Kidney disease Sister    Cancer Sister    Diabetes Sister    Hyperlipidemia Sister    Hypertension Sister     Current Outpatient Medications:    albuterol  (VENTOLIN  HFA) 108 (90 Base) MCG/ACT inhaler, Inhale 2 puffs every 4-6 hours as needed for shortness of breath or wheezing, Disp: 18 g, Rfl: 1   aspirin  EC 81 MG tablet, Take 1 tablet (81 mg total) by mouth daily. Swallow whole., Disp: , Rfl:    Biotin 1 MG CAPS, Take 1 mg by mouth daily. , Disp: , Rfl:    fluticasone -salmeterol (WIXELA INHUB ) 100-50 MCG/ACT AEPB, Inhale 1 puff into the lungs 2 (two) times daily., Disp: 60 each, Rfl: 3   omeprazole (PRILOSEC) 20 MG capsule, Take 20 mg by mouth daily., Disp: , Rfl:    potassium chloride  SA (KLOR-CON  M) 20 MEQ tablet, Take 1 tablet (20 mEq total) by mouth daily. X 7 days, then use only on days you take Lasix , Disp: 30 tablet, Rfl: 3   tiZANidine (ZANAFLEX) 2 MG tablet, Take 1 tablet (2 mg total) by mouth every 6 (six) hours as needed for muscle spasms., Disp: 90 tablet, Rfl: 1   carvedilol  (COREG ) 3.125 MG tablet, Take 2 tablets (6.25 mg total) by mouth 2 (two) times daily with a meal., Disp: , Rfl:    clopidogrel  (PLAVIX ) 75 MG tablet, Take 1 tablet (75 mg total) by mouth daily., Disp: 90 tablet, Rfl: 1   dapagliflozin  propanediol (FARXIGA ) 10 MG TABS tablet, Take 1 tablet (10 mg total) by mouth daily., Disp: 90 tablet, Rfl: 1   furosemide  (LASIX ) 40 MG tablet, Take 1 tablet (40 mg total) by mouth daily. X 7 days, then transition to daily as needed for weight gain of 3-5 lbs in 24-48 hours, Disp: 30 tablet, Rfl: 1   Multiple Vitamin (MULTIVITAMIN WITH MINERALS) TABS, Take 1 tablet by mouth daily., Disp: , Rfl:    nitroGLYCERIN  (NITROSTAT ) 0.4 MG SL tablet, Place 1 tablet  (0.4 mg total) under the tongue every 5 (five) minutes as needed for chest pain., Disp: 25 tablet, Rfl: 3   spironolactone  (ALDACTONE ) 25 MG tablet, Take 0.5 tablets (12.5 mg total) by mouth daily., Disp: 90 tablet, Rfl: 1 Allergies  Allergen Reactions   Atorvastatin  Other (See Comments)    Muscle cramping   Latex Itching   Propoxyphene Nausea And Vomiting    Darvocet     ROS: A complete ROS was performed with pertinent positives/negatives noted in the HPI. The remainder of the ROS are negative.    Objective:  Today's Vitals   07/10/24 1015  BP: (!) 160/86  Pulse: 76  Temp: 98.1 F (36.7 C)  TempSrc: Temporal  SpO2: 98%  Weight: 129 lb (58.5 kg)  Height: 4' 9 (1.448 m)    GENERAL: Well-appearing, in NAD. Well nourished.  SKIN: Pink, warm and dry.  NECK: Trachea midline. Full ROM w/o pain or tenderness. No lymphadenopathy.  RESPIRATORY: Chest wall symmetrical. Respirations even and non-labored. Breath sounds clear to auscultation bilaterally.  CARDIAC: S1, S2 present, regular rate and rhythm. Peripheral pulses 2+ bilaterally.  EXTREMITIES: Without clubbing, cyanosis, or edema.  NEUROLOGIC: No motor or sensory deficits. Steady, even gait.  PSYCH/MENTAL STATUS: Alert, oriented x 3. Cooperative, appropriate mood and affect.   Health Maintenance Due  Topic Date Due   Medicare Annual Wellness (AWV)  04/14/2021    No results found for any visits on 07/10/24.  The ASCVD Risk score (Arnett DK, et al., 2019) failed to calculate for the following reasons:   Risk score cannot be calculated because patient has a medical history suggesting prior/existing ASCVD     Assessment & Plan:  Assessment and Plan    Hypertension Blood pressure elevated. Valsartan  discontinued due to kidney concerns. Increasing carvedilol  may lower blood pressure but requires heart rate monitoring. - Increase carvedilol  to two tablets (6.25mg ) twice daily. - Monitor heart rate regularly, goal is 60  -100 - Continue spironolactone  half a tablet (12.5mg ) once daily. - Continue Lasix  PRN for excess fluid. Take potassium supplement PRN when taking Lasix .   Coronary Artery Disease , S/P CABG x3 - Restart Plavix  75mg  PO daily. - Continue aspirin  81 mg daily. - Order Farxiga  10mg  PO daily and coordinate with pharmacy team to explore insurance coverage options.[facility] referral has already been entered.  - Refer to Lipid clinic for lipid control   Hyperlipidemia Statin intolerance due to muscle cramping. Referral to lipid clinic for alternative management, including injectable medications. - Refer to lipid clinic for cholesterol management. - Avoid statins due to intolerance.  Chronic Obstructive Pulmonary Disease (COPD) Difficulty obtaining Advair. Breathing managed with albuterol . Alternative inhaler needed due to cost issues. - Discontinue Advair, prescribe Wixela to see if better coverage with insurance.  - Continue albuterol  as needed.  Iron Overload Elevated hemoglobin. Currently on multivitamin with iron. No hx of hemochromatosis. Was anemia s/p bypass in prior records.  - Is not currently smoking. Is well hydrated.  - Discontinue multivitamin with iron. - Switch to regular multivitamin without iron.       Orders Placed This Encounter  Procedures   AMB Referral to [facility]    Referral Priority:   Routine    Referral Type:   Consultation    Referral Reason:   Specialty Services Required    Number of Visits Requested:   1   No images are attached to the encounter or orders placed in the encounter. Meds ordered this encounter  Medications   clopidogrel  (PLAVIX ) 75 MG tablet    Sig: Take 1 tablet (75 mg total) by mouth daily.    Dispense:  90 tablet    Refill:  1    Supervising Provider:   THOMPSON, AARON B [[id number]]   carvedilol  (COREG ) 3.125 MG tablet    Sig: Take 2 tablets (6.25 mg total) by mouth 2 (two) times daily with a meal.    Supervising  Provider:   THOMPSON, AARON B [[id number]]   dapagliflozin  propanediol (FARXIGA ) 10 MG TABS tablet    Sig: Take 1 tablet (  10 mg total) by mouth daily.    Dispense:  90 tablet    Refill:  1    Supervising Provider:   THOMPSON, AARON B [[id number]]   spironolactone  (ALDACTONE ) 25 MG tablet    Sig: Take 0.5 tablets (12.5 mg total) by mouth daily.    Dispense:  90 tablet    Refill:  1    Supervising Provider:   THOMPSON, AARON B [[id number]]   fluticasone -salmeterol (WIXELA INHUB ) 100-50 MCG/ACT AEPB    Sig: Inhale 1 puff into the lungs 2 (two) times daily.    Dispense:  60 each    Refill:  3    Supervising Provider:   SEBASTIAN BEVERLEY NOVAK [[id number]]    Return in about 3 months (around 10/10/2024) for Chronic Condition follow up.   Rosina Senters, FNP

## 2024-07-17 ENCOUNTER — Other Ambulatory Visit (HOSPITAL_COMMUNITY): Payer: Self-pay

## 2024-07-17 ENCOUNTER — Ambulatory Visit: Attending: Internal Medicine | Admitting: Internal Medicine

## 2024-07-17 ENCOUNTER — Telehealth: Payer: Self-pay

## 2024-07-17 VITALS — BP 190/80 | HR 81 | Ht <= 58 in | Wt 127.0 lb

## 2024-07-17 DIAGNOSIS — I2583 Coronary atherosclerosis due to lipid rich plaque: Secondary | ICD-10-CM

## 2024-07-17 DIAGNOSIS — T466X5D Adverse effect of antihyperlipidemic and antiarteriosclerotic drugs, subsequent encounter: Secondary | ICD-10-CM

## 2024-07-17 DIAGNOSIS — I10 Essential (primary) hypertension: Secondary | ICD-10-CM

## 2024-07-17 DIAGNOSIS — Z951 Presence of aortocoronary bypass graft: Secondary | ICD-10-CM

## 2024-07-17 DIAGNOSIS — I2510 Atherosclerotic heart disease of native coronary artery without angina pectoris: Secondary | ICD-10-CM | POA: Diagnosis not present

## 2024-07-17 DIAGNOSIS — E785 Hyperlipidemia, unspecified: Secondary | ICD-10-CM | POA: Diagnosis not present

## 2024-07-17 DIAGNOSIS — M791 Myalgia, unspecified site: Secondary | ICD-10-CM | POA: Diagnosis not present

## 2024-07-17 MED ORDER — VALSARTAN 320 MG PO TABS: 320.0000 mg | ORAL_TABLET | Freq: Every day | ORAL | 3 refills | Status: AC

## 2024-07-17 NOTE — Telephone Encounter (Signed)
 Pharmacy Patient Advocate Encounter  Received notification from [facility] that Prior Authorization for REPATHA has been APPROVED from 12/28/23 to 12/26/24. Ran test claim, Copay is $144.24. This test claim was processed through [facility]- copay amounts may vary at other pharmacies due to pharmacy/plan contracts, or as the patient moves through the different stages of their insurance plan.

## 2024-07-17 NOTE — Telephone Encounter (Signed)
 Pharmacy Patient Advocate Encounter   Received notification from Physician's Office that prior authorization for REPATHA  is required/requested.   Insurance verification completed.   The patient is insured through [facility] .   Per test claim: PA required; PA submitted to above mentioned insurance via CoverMyMeds Key/confirmation #/EOC [id number] Status is pending

## 2024-07-17 NOTE — Telephone Encounter (Signed)
-----   Message from Nurse Jocabed C sent at 07/17/2024  3:53 PM EDT ----- Regarding: Repatha  PA Good afternoon. Patient was prescribed Repatha  today. Thank you.

## 2024-07-17 NOTE — Patient Instructions (Addendum)
 Medication Instructions:  Dr. Mona recommends Repatha  (PCSK9). This is an injectable cholesterol medication self-administered once every 14 days. This medication will likely need prior approval with your insurance company, which we will work on. If the medication is not approved initially, we may need to do an appeal with your insurance.   Administer medication in area of fatty tissue such as abdomen, outer thigh, back of upper arm - and rotate site with each injection Store medication in refrigerator until ready to administer - allow to sit at room temp for 30 mins - 1 hour prior to injection Dispose of medication in a SHARPS container - your pharmacy should be able to direct you on this and proper disposal   If you need a co-pay card for Repatha : https://www.repatha .com/repatha -cost If you need a co-pay card for Praluent: [email or web address]://[email or web address]  Patient Assistance:    These foundations have funds at various times.   The [employer] Foundation: https://www.panfoundation.org/disease-funds/hypercholesterolemia/ -- can sign up for wait list  The [employer] offers assistance to help pay for medication copays.  They will cover copays for all cholesterol lowering meds, including statins, fibrates, omega-3 fish oils like Vascepa, ezetimibe , Repatha , Praluent, Nexletol , Nexlizet .  The cards are usually good for $2,500 or 12 months, whichever comes first. Our fax # is [phone] (you will need this to apply) Go to healthwellfoundation.org Click on "Apply Now" Answer questions as to whom is applying (patient or representative) Your disease fund will be "hypercholesterolemia - Medicare access" They will ask questions about finances and which medications you are taking for cholesterol When you submit, the approval is usually within minutes.  You will need to print the card information from the site You will need to show this information to your pharmacy, they will bill your  Medicare Part D plan first -then bill [employer] --for the copay.   You can also call them at [phone], although the hold times can be quite long.   *If you need a refill on your cardiac medications before your next appointment, please call your pharmacy*  Lab Work: FASTING Lipid panel in 3 months  If you have labs (blood work) drawn today and your tests are completely normal, you will receive your results only by: MyChart Message (if you have MyChart) OR A paper copy in the mail If you have any lab test that is abnormal or we need to change your treatment, we will call you to review the results.  Follow-Up: At [facility], you and your health needs are our priority.  As part of our continuing mission to provide you with exceptional heart care, our providers are all part of one team.  This team includes your primary Cardiologist (physician) and Advanced Practice Providers or APPs (Physician Assistants and Nurse Practitioners) who all work together to provide you with the care you need, when you need it.  Your next appointment:   4 months with Hilty or APP

## 2024-07-17 NOTE — Progress Notes (Signed)
 OFFICE NOTE  Chief Complaint:  Follow-up CABG   Primary Care Physician: Billy Knee, FNP  HPI:  Meghan Lynn  is a 73 year old female with a history of coronary artery disease status post stenting of the right coronary with 2 Cypher stents and a Taxus stent to the proximal right coronary in 2004. She had a negative stress test in 2007 and otherwise has done pretty well, has been asymptomatic. She has had problems with ongoing tobacco abuse and continues to smoke, however, she says this has been a particularly difficult year. She reported that her husband died this year as well as her sister and brother-in-law. Given these multiple problems she has had a difficult time with grieving and has of course resorted back to smoking. Before she has tried Chantix but has failed that. She is doing some activity, including walking on a treadmill about 3 times a week. Her smoking is less than 1 pack per day, denies any alcohol or street drugs.    She reports since I last saw her that she underwent back surgery and has reported a marked improvement in her back pain. She is continuing to have some difficulty since her husband died and has started smoking again. I have provided her with Wellbutrin  however she ran out of that but says that it was very helpful for her.  I saw Meghan Lynn back in the office today. Overall she is doing fairly well. Unfortunate she's had 3 Desyrel with the past year and has battled some depression. She ran out of her Wellbutrin  and has started smoking again. She wishes to get back off the cigarettes and restart Wellbutrin . From a cardiac standpoint she denies any chest pain or worsening shortness of breath. She's only taking aspirin  once a week due to some stomach upset. She's also concerned about the cost of valsartan  and due to being on Medicare.  09/29/2016  Meghan Lynn returns today for follow-up. Recently she's been describing some more shortness of breath, particularly  with exertion. She is not exercising as much but does mow her lawn and do trimming and a number of physical activities for which she has to stop more frequently to catch her breath. She does not report any chest tightness with this but is fatigued reports her legs get weak somewhat and feel like they're going to give out on her. She has continued to smoke unfortunately. She does have a coronary artery disease history with PCI in 2007.  11/16/2016  Meghan Lynn returns for follow-up today. She underwent a nuclear stress test which was negative for ischemia with an EF of 67%. He's had a marked improvement in her blood pressure today 140/88. In general blood pressure runs between 120-140 systolic, unfortunately she's had some cramping in her legs, particularly at night. This is become somewhat intolerable however she did not contact the office. Her renal profile one week after starting the medication showed slight increase in creatinine 0.97 from 0.84 but within normal limits. Potassium was 4.6.  06/22/2017  Meghan Lynn returns today for follow-up. Overall she seems to be doing well although she is reported some leg heaviness. She's had some trouble with this for a while noted recently when trying to push mower lawn she's had some weakness and fatigue in her legs. She seen her neurosurgeon who felt that this was not related to any significant spinal stenosis or other neurologic pathology. She does have a long-standing smoking history. She reports discomfort that starts in around the lower back and  radiates around both hips to the anterior thighs. She says is not burning in quality and is able to walk on a treadmill for a certain period of time before having to stop. She continues to smoke. Blood pressure is well-controlled today.  12/26/2017  Meghan Lynn returns today for follow-up.  She reports her legs have improved somewhat.  She is walking more now is back to work.  She recently has been having some problems with  cramping in her legs.  She cut her blood pressure medication in half which is a combination of irbesartan  HCTZ.  She says that has improved her cramping, which suggest to me that she is sensitive to the diuretic.  Unfortunately the blood pressures not at goal today.  Is been about a year since she has had her last lipid profile.  07/18/2018  Meghan Lynn returns today for follow-up.  She reports leg stickiness.  She denied any worsening leg cramps after stopping her thiazide.  Blood pressure was elevated today 154/94 of her repeat cumulative 130 systolic.  She reports recent hair loss and notably that her hair was very fragile.  Is due for repeat lipid profile.  She denies any chest pain.  08/28/2019  Meghan Lynn is seen today in follow-up.  She says she still having some leg discomfort when walking on the treadmill.  This is proximal primarily in both legs and in the hip areas.  It is worse when she is walking up or down inclines.  She does have a history of prior lumbar spine surgery.  She had previous lower extremity arterial Dopplers which showed some atherosclerosis but no significant reduction in ABI.  She continues to smoke and is at risk for PAD.  11/18/2020  Meghan Lynn returns today for follow-up.  Unfortunately she continues to smoke.  She has a 50+ pack year smoking history.  She is not seen her PCP recently, in fact she is in the market for new primary.  She denies any symptoms of claudication but does have some known PAD.  Lower extremity arterial Dopplers were recently performed.  She denies any chest pain.  Recent lipids in February 21 showed total cholesterol 176, HDL 37, LDL 79 and triglycerides at 374.  She is on atorvastatin  40 mg daily.  02/19/2021  Meghan Lynn returns today for follow-up.  She has not stop smoking.  She had a CT lung cancer screening study which showed lung RADS 2 benign tiny pulmonary nodules up to 3.8 mm.  Repeat study was recommended in 1 year.  Emphysema  and aortic atherosclerosis was noted.  She is due for repeat lab work.  Her PCP had stopped practicing and therefore she has no primary.  She does report shortness of breath with exertion.  She also starts to complain about some symptoms concerning for claudication.  She has known PAD without obstruction by lower extremity arterial Dopplers in 2020.  Blood pressure remains elevated.  06/11/2021  Mrs. Pagaduan is seen today in follow-up.  Overall she seems she is doing pretty well from a cardiac standpoint.  Denies any chest pain or worsening shortness of breath.  I did send her to pulmonary and they felt that she had mild COPD.  They prescribed an Anoro Ellipta  inhaler however this was too expensive for her.  She denies any significant pain with walking.  She had repeat Dopplers which showed no lower extremity arterial obstruction.  Blood pressure was elevated however after her adding amlodipine  her blood pressure is now  better controlled.  She is overdue for repeat lipids in the past has had high triglycerides.  She also complained of some right breast tenderness.  This is been going on for a while and she has been trying to get a mammogram however apparently due to insurance she has been denied.  I advised her to reach out to her primary as this would be indicated for an acute concern not just for screening.  07/17/2024  Ms. Deford returns today for follow-up.  She was seen in February 2025 at which time she was having episodes of chest discomfort.  A Myoview  stress test was ordered however prior to that study being performed she presented to emergency department with worsening chest pain.  Her lactic acid level was elevated at 4.6.  Troponin was elevated and she was admitted for NSTEMI.  Echo showed LVEF 45 to 50% with possible LAD territory wall motion ischemia.  She then underwent cardiac catheterization in February which showed occlusion of the RCA from the proximal to distal vessel, 99% distal LAD  lesion and 80% proximal to mid left circumflex lesion.  An intra-aortic balloon pump was placed the patient was taken to the ICU.  She ultimately underwent CABG x 3 with LIMA to LAD, SVG to OM and SVG to PDA on February 14.  Due to some periprocedural hypotension her medications have been held but were slowly restarted.  Unfortunately she could not tolerate statins and consideration was given for PCSK9 inhibitor.  She now returns for follow-up today.  Blood pressure was substantially elevated at 190/80.  She is not on valsartan  which she previously was on 320 mg daily.  She is on carvedilol  6.25 mg twice a day and low-dose spironolactone  12.5 mg daily.  PMHx:  Past Medical History:  Diagnosis Date   Active smoker    Anginal pain (HCC)    Arthritis    Asthma    patient denies   Bronchitis due to tobacco use    Burning sensation of feet    Coronary artery disease    3 stents to RCA   Dilatation of esophagus    Gastritis    GERD (gastroesophageal reflux disease)    H/O pyloric stenosis 01/31/2020   Headache(784.0)    cluster every day   Headache, migraine    daily   Hyperlipidemia    Hypertension    Lower GI bleed 06/20/2020   Peptic ulcer disease    Pneumonia    Shortness of breath dyspnea    with exertion    Past Surgical History:  Procedure Laterality Date   BACK SURGERY     BALLOON DILATION N/A 05/30/2013   Procedure: BALLOON DILATION;  Surgeon: Norleen JAYSON Hint, MD;  Location: [facility] ENDOSCOPY;  Service: Endoscopy;  Laterality: N/A;   BALLOON DILATION N/A 08/02/2013   Procedure: BALLOON DILATION;  Surgeon: Norleen JAYSON Hint, MD;  Location: [facility] ENDOSCOPY;  Service: Endoscopy;  Laterality: N/A;   BALLOON DILATION N/A 04/29/2016   Procedure: BALLOON DILATION;  Surgeon: Norleen Hint, MD;  Location: [facility] ENDOSCOPY;  Service: Endoscopy;  Laterality: N/A;   BALLOON DILATION N/A 06/16/2020   Procedure: BALLOON DILATION;  Surgeon: Saintclair Jasper, MD;  Location: [facility] ENDOSCOPY;  Service: Gastroenterology;   Laterality: N/A;   BIOPSY  06/16/2020   Procedure: BIOPSY;  Surgeon: Saintclair Jasper, MD;  Location: [facility] ENDOSCOPY;  Service: Gastroenterology;;   CARDIAC CATHETERIZATION  03/07/2002   moderte CAD 80% (mid RCA) - Dr. Elsie Ona   CARDIAC CATHETERIZATION  09/13/2003  Cypher 2.5x65mm and 2.5x18 and Taxus 2.75x79mm to distal, mid, prox RCA (Dr. FABIENE Hasten)   CARDIOLITE MYOCARDIAL PERFUSION STUDY  05/2006   positive bruce protocol, low risk, EF 80%   CARDIOVASCULAR STRESS TEST  2010   COLONOSCOPY     COLONOSCOPY WITH PROPOFOL  N/A 06/16/2020   Procedure: COLONOSCOPY WITH PROPOFOL ;  Surgeon: Saintclair Jasper, MD;  Location: [facility] ENDOSCOPY;  Service: Gastroenterology;  Laterality: N/A;   COLONOSCOPY WITH PROPOFOL  N/A 06/21/2020   Procedure: COLONOSCOPY WITH PROPOFOL ;  Surgeon: Elicia Claw, MD;  Location: [facility] ENDOSCOPY;  Service: Gastroenterology;  Laterality: N/A;   CORONARY ANGIOPLASTY  2004   stents x 3   CORONARY ARTERY BYPASS GRAFT N/A 02/10/2024   Procedure: CORONARY ARTERY BYPASS GRAFTING (CABG) TIMES THREE USING LEFT INTERNAL MAMMARY ARTERY AND ENDOSCOPICALLY HARVESTED RIGHT GREATER SAPHENOUS VEIN.;  Surgeon: Kerrin Elspeth BROCKS, MD;  Location: [facility] OR;  Service: Open Heart Surgery;  Laterality: N/A;   DILATATION & CURETTAGE/HYSTEROSCOPY WITH MYOSURE N/A 08/19/2016   Procedure: DILATATION & CURETTAGE/HYSTEROSCOPY WITH MYOSURE;  Surgeon: Dickie Carder, MD;  Location: [facility] ORS;  Service: Gynecology;  Laterality: N/A;   ESOPHAGOGASTRODUODENOSCOPY N/A 05/30/2013   Procedure: ESOPHAGOGASTRODUODENOSCOPY (EGD);  Surgeon: Norleen BROCKS Hint, MD;  Location: [facility] ENDOSCOPY;  Service: Endoscopy;  Laterality: N/A;   ESOPHAGOGASTRODUODENOSCOPY N/A 08/02/2013   Procedure: ESOPHAGOGASTRODUODENOSCOPY (EGD);  Surgeon: Norleen BROCKS Hint, MD;  Location: [facility] ENDOSCOPY;  Service: Endoscopy;  Laterality: N/A;  barb /ja   ESOPHAGOGASTRODUODENOSCOPY (EGD) WITH PROPOFOL  N/A 04/29/2016   Procedure: ESOPHAGOGASTRODUODENOSCOPY (EGD) WITH  PROPOFOL ;  Surgeon: Norleen Hint, MD;  Location: [facility] ENDOSCOPY;  Service: Endoscopy;  Laterality: N/A;   ESOPHAGOGASTRODUODENOSCOPY (EGD) WITH PROPOFOL  N/A 06/16/2020   Procedure: ESOPHAGOGASTRODUODENOSCOPY (EGD) WITH PROPOFOL ;  Surgeon: Saintclair Jasper, MD;  Location: [facility] ENDOSCOPY;  Service: Gastroenterology;  Laterality: N/A;   FRACTURE SURGERY     HEMOSTASIS CLIP PLACEMENT  06/21/2020   Procedure: HEMOSTASIS CLIP PLACEMENT;  Surgeon: Elicia Claw, MD;  Location: [facility] ENDOSCOPY;  Service: Gastroenterology;;   IABP INSERTION Right 02/08/2024   Procedure: IABP Insertion;  Surgeon: Wendel Lurena POUR, MD;  Location: [facility] INVASIVE CV LAB;  Service: Cardiovascular;  Laterality: Right;   LEFT HEART CATH AND CORONARY ANGIOGRAPHY N/A 02/08/2024   Procedure: LEFT HEART CATH AND CORONARY ANGIOGRAPHY;  Surgeon: Wendel Lurena POUR, MD;  Location: [facility] INVASIVE CV LAB;  Service: Cardiovascular;  Laterality: N/A;   ORIF ELBOW FRACTURE  2006   POLYPECTOMY  06/16/2020   Procedure: POLYPECTOMY;  Surgeon: Saintclair Jasper, MD;  Location: [facility] ENDOSCOPY;  Service: Gastroenterology;;   POLYPECTOMY  06/21/2020   Procedure: POLYPECTOMY;  Surgeon: Elicia Claw, MD;  Location: [facility] ENDOSCOPY;  Service: Gastroenterology;;   TEE WITHOUT CARDIOVERSION N/A 02/10/2024   Procedure: TRANSESOPHAGEAL ECHOCARDIOGRAM (TEE);  Surgeon: Kerrin Elspeth BROCKS, MD;  Location: [facility] OR;  Service: Open Heart Surgery;  Laterality: N/A;   TRANSTHORACIC ECHOCARDIOGRAM  07/2008   normal LV function, mild MR, mild TR, trace pulm valve regurg    FAMHx:  Family History  Problem Relation Age of Onset   Diabetes Mother    CAD Mother        CABG   Bone cancer Father    Hypertension Father    Heart disease Brother    Kidney disease Sister    Cancer Sister    Diabetes Sister    Hyperlipidemia Sister    Hypertension Sister     SOCHx:   reports that she has quit smoking. Her smoking use included cigarettes. She has a 27 pack-year smoking history.  She has  never used smokeless tobacco. She reports current alcohol use. She reports that she does not use drugs.  ALLERGIES:  Allergies  Allergen Reactions   Rosuvastatin      Muscle aches   Atorvastatin  Other (See Comments)    Muscle cramping   Latex Itching   Propoxyphene Nausea And Vomiting    Darvocet    ROS: Pertinent items noted in HPI and remainder of comprehensive ROS otherwise negative.  HOME MEDS: Current Outpatient Medications  Medication Sig Dispense Refill   albuterol  (VENTOLIN  HFA) 108 (90 Base) MCG/ACT inhaler Inhale 2 puffs every 4-6 hours as needed for shortness of breath or wheezing 18 g 1   aspirin  EC 81 MG tablet Take 1 tablet (81 mg total) by mouth daily. Swallow whole.     Biotin 1 MG CAPS Take 1 mg by mouth daily.      carvedilol  (COREG ) 3.125 MG tablet Take 2 tablets (6.25 mg total) by mouth 2 (two) times daily with a meal.     clopidogrel  (PLAVIX ) 75 MG tablet Take 1 tablet (75 mg total) by mouth daily. 90 tablet 1   dapagliflozin  propanediol (FARXIGA ) 10 MG TABS tablet Take 1 tablet (10 mg total) by mouth daily. 90 tablet 1   fluticasone -salmeterol (WIXELA INHUB ) 100-50 MCG/ACT AEPB Inhale 1 puff into the lungs 2 (two) times daily. 60 each 3   furosemide  (LASIX ) 40 MG tablet Take 1 tablet (40 mg total) by mouth daily. X 7 days, then transition to daily as needed for weight gain of 3-5 lbs in 24-48 hours 30 tablet 1   Multiple Vitamin (MULTIVITAMIN WITH MINERALS) TABS Take 1 tablet by mouth daily.     nitroGLYCERIN  (NITROSTAT ) 0.4 MG SL tablet Place 1 tablet (0.4 mg total) under the tongue every 5 (five) minutes as needed for chest pain. 25 tablet 3   omeprazole (PRILOSEC) 20 MG capsule Take 20 mg by mouth daily.     potassium chloride  SA (KLOR-CON  M) 20 MEQ tablet Take 1 tablet (20 mEq total) by mouth daily. X 7 days, then use only on days you take Lasix  30 tablet 3   spironolactone  (ALDACTONE ) 25 MG tablet Take 0.5 tablets (12.5 mg total) by mouth daily. 90 tablet 1    tiZANidine (ZANAFLEX) 2 MG tablet Take 1 tablet (2 mg total) by mouth every 6 (six) hours as needed for muscle spasms. 90 tablet 1   valsartan  (DIOVAN ) 320 MG tablet Take 1 tablet (320 mg total) by mouth daily. 90 tablet 3   No current facility-administered medications for this visit.    LABS/IMAGING: No results found for this or any previous visit (from the past 48 hours). No results found.  VITALS: BP (!) 190/80   Pulse 81   Ht 4' 9 (1.448 m)   Wt 127 lb (57.6 kg)   SpO2 96%   BMI 27.48 kg/m   EXAM: General appearance: alert and no distress Neck: no carotid bruit and no JVD Lungs: clear to auscultation bilaterally Heart: regular rate and rhythm, S1, S2 normal, no murmur, click, rub or gallop Abdomen: soft, non-tender; bowel sounds normal; no masses,  no organomegaly Extremities: extremities normal, atraumatic, no cyanosis or edema Pulses: 1+ DP/PT pulses bilaterally Skin: Skin color, texture, turgor normal. No rashes or lesions Neurologic: Grossly normal Psych: Pleasant  EKG: EKG Interpretation Date/Time:  Tuesday July 17 2024 14:53:59 EDT Ventricular Rate:  84 PR Interval:  140 QRS Duration:  72 QT Interval:  356 QTC Calculation: 420 R Axis:  67  Text Interpretation: Sinus rhythm with frequent Premature ventricular complexes Septal infarct , age undetermined ST & T wave abnormality, consider inferolateral ischemia When compared with ECG of 11-Feb-2024 07:00, Premature ventricular complexes are now Present Incomplete right bundle branch block is no longer Present Septal infarct is now Present Confirmed by Mona Kent [phone]) on 07/17/2024 9:45:21 PM    ASSESSMENT: Bilateral leg pain with walking - PAD with no significant obstruction CAD status post PCI x3 to the right coronary artery 2007, status post three-vessel CABG (02/10/2024)-LIMA to LAD, SVG to OM, SVG to PDA Hypertension Dyslipidemia Tobacco abuse GERD Weight gain, hair loss Emphysema  PLAN:  1.    Mrs. Riera underwent coronary artery bypass grafting x 3 in February of this year.  After follow-up she has had issues with hypertension and is off of a lot of medicine she was on previously.  I would advise restarting her valsartan  320 mg daily.  Continue her carvedilol  and spironolactone .  In addition she was intolerant to statins.  I would advise starting PCSK9 inhibitor therapy.  We will reach out for prior authorization for this and plan repeat lipids in about 3 to 4 months.  She can follow-up afterwards at that time with myself or APP. She also had an appt with our lipid clinic pharmacist Melissa, which I cancelled since her cholesterol was addressed at this visit.  Kent KYM Mona, MD, [facility], FNLA, FACP  [place]  [facility]  Medical Director of the [facility]  [facility]ovascular Risk Reduction Clinic Diplomate of the American Board of Clinical Lipidology Attending Cardiologist  Direct Dial: [phone]  Fax: [phone]  Website:  [email or web address] Kent JAYSON Mona 07/17/2024, 9:45 PM

## 2024-07-18 ENCOUNTER — Other Ambulatory Visit: Payer: Self-pay

## 2024-07-18 MED ORDER — CARVEDILOL 6.25 MG PO TABS
6.2500 mg | ORAL_TABLET | Freq: Two times a day (BID) | ORAL | 0 refills | Status: DC
Start: 2024-07-18 — End: 2024-10-17

## 2024-07-18 NOTE — Progress Notes (Unsigned)
 07/18/2024 Name: Meghan Lynn MRN: [id number] DOB: 27-Mar-1951  Chief Complaint  Patient presents with   Medication Management   Meghan Lynn is a 73 y.o. year old female who presented for a telephone visit.   They were referred to the pharmacist by their PCP for assistance in managing medication access.   Subjective:  Care Team: Primary Care Provider: Billy Knee, FNP  Medication Access/Adherence  Current Pharmacy:  [facility] [phone] [place], [place] - [phone] [place] [address] Phone: [phone] Fax: [phone]  [name] [address] [place] [address] Phone: [phone] Fax: [phone]  -Patient reports affordability concerns with their medications: Yes  -Patient reports access/transportation concerns to their pharmacy: No  -Patient reports adherence concerns with their medications:  Yes    Hypertension: Current medications: carvedilol  3.125mg  BID -PCP recently increased carvedilol  to 6.25mg  BID, but patient has not started this dose b/c she is concerned with running out of tablets at home; and updated prescription was not sent to her pharmacy -Saw Dr. Mona with cardiology yesterday, and he restarted valsartan  320mg  based on elevated BP readings (stopped previously based on declined kidney function).  Patient picked medication up but has not restarted yet. -Patient has a validated, automated, upper arm home BP cuff but she does not provide home readings -BP at cardiology visit yesterday 190/80  Hyperlipidemia/ASCVD Risk Reduction Current lipid lowering medications: none -Medications tried in the past: statins cause severe muscle cramps that have required her calling EMS in the past -Cardiology would like to start Repatha , and PA has been approved; but copay will be $144.24.  Patient does qualify for [employer] for hyperlipidemia that will take copay  to $0.  Prescription has not yet been sent to pharmacy. Antiplatelet regimen: ASA 81mg  daily, clopidogrel  75mg  daily -Recently resumed clopidogrel  at 75mg  daily  -Cardiology note states patient only taking ASA 81mg  once a week due to stomach upset  Heart Failure (EF 40-50%): Current medications:  ACEi/ARB/ARNI: Valsartan  320mg  (not resumed yet) SGLT2i: Farxiga  10mg  daily (not taking currently) Beta blocker: carvedilol  6.25mg  BID (has not started this dose increase yet) Mineralocorticoid Receptor Antagonist: sprinoloactone 12.5mg  daily Diuretic regimen: furosemide  40mg  PRN  -Patient will resume valsartan  320mg ; just added back on yesterday by Dr. Mona -Carvedilol  dose increase not yet sent to pharmacy  -Farxiga  is not affordable for patient at this time  COPD: -No recent exacerbations noted -Previously prescribed Advair, but this medication was not affordable; so Wixela was sent to pharmacy.  Patient has not picked this medication up yet, though. -Currently has Advair HFA rescue inhaler on hand to use as needed  Objective:  Lab Results  Component Value Date   HGBA1C 6.2 (H) 02/07/2024   Lab Results  Component Value Date   CREATININE 1.03 07/05/2024   BUN 11 07/05/2024   NA 141 07/05/2024   K 3.8 07/05/2024   CL 105 07/05/2024   CO2 31 07/05/2024   Lab Results  Component Value Date   CHOL 228 (H) 07/05/2024   HDL 44.80 07/05/2024   LDLCALC 148 (H) 07/05/2024   LDLDIRECT 82 04/26/2023   TRIG 177.0 (H) 07/05/2024   CHOLHDL 5 07/05/2024   Medications Reviewed Today     Reviewed by Deanna Channing DELENA, RPH (Pharmacist) on 07/18/24 at 0930  Med List Status: <None>   Medication Order Taking? Sig Documenting Provider Last Dose Status Informant  albuterol  (VENTOLIN  HFA)  108 (90 Base) MCG/ACT inhaler [id number] Yes Inhale 2 puffs every 4-6 hours as needed for shortness of breath or wheezing Billy Knee, FNP  Active   aspirin  EC 81 MG tablet [id number]  Take 1 tablet (81 mg  total) by mouth daily. Swallow whole. Barrett, Erin R, PA-C  Active   Biotin 1 MG CAPS [id number]  Take 1 mg by mouth daily.  [provider]  Active Self, Pharmacy Records    Discontinued 07/18/24 0930 (Dose change)   clopidogrel  (PLAVIX ) 75 MG tablet [id number] Yes Take 1 tablet (75 mg total) by mouth daily. Billy Knee, FNP  Active   dapagliflozin  propanediol (FARXIGA ) 10 MG TABS tablet [id number]  Take 1 tablet (10 mg total) by mouth daily.  Patient not taking: Reported on 07/18/2024   Billy Knee, FNP  Active   fluticasone -salmeterol (WIXELA INHUB ) 100-50 MCG/ACT AEPB [id number]  Inhale 1 puff into the lungs 2 (two) times daily.  Patient not taking: Reported on 07/18/2024   Billy Knee, FNP  Active   furosemide  (LASIX ) 40 MG tablet [id number] Yes Take 1 tablet (40 mg total) by mouth daily. X 7 days, then transition to daily as needed for weight gain of 3-5 lbs in 24-48 hours  Patient taking differently: Take 40 mg by mouth daily as needed. for weight gain of 3-5 lbs in 24-48 hours   Barrett, Erin R, PA-C  Active   Multiple Vitamin (MULTIVITAMIN WITH MINERALS) TABS [id number]  Take 1 tablet by mouth daily. [provider]  Active Self, Pharmacy Records  nitroGLYCERIN  (NITROSTAT ) 0.4 MG SL tablet [id number]  Place 1 tablet (0.4 mg total) under the tongue every 5 (five) minutes as needed for chest pain. Loistine Sober, NP  Expired 07/17/24 2359 Self, Pharmacy Records           Med Note DEDE, DORIEN J   Fri Mar 02, 2024  1:35 PM) Pt takes as needed.  omeprazole (PRILOSEC) 20 MG capsule [id number]  Take 20 mg by mouth daily. [provider]  Active Self, Pharmacy Records  potassium chloride  SA (KLOR-CON  M) 20 MEQ tablet [id number]  Take 1 tablet (20 mEq total) by mouth daily. X 7 days, then use only on days you take Lasix  Barrett, Erin R, PA-C  Active   spironolactone  (ALDACTONE ) 25 MG tablet [id number] Yes Take 0.5 tablets (12.5 mg total) by mouth daily. Billy Knee,  FNP  Active   tiZANidine (ZANAFLEX) 2 MG tablet [id number]  Take 1 tablet (2 mg total) by mouth every 6 (six) hours as needed for muscle spasms. Barrett, Rocky SAUNDERS, PA-C  Active   valsartan  (DIOVAN ) 320 MG tablet [id number]  Take 1 tablet (320 mg total) by mouth daily.  Patient not taking: Reported on 07/18/2024   Mona Vinie BROCKS, MD  Active            Assessment/Plan:   Hypertension: -Currently uncontrolled -Reviewed long term cardiovascular and renal outcomes of uncontrolled blood pressure -Reviewed appropriate blood pressure monitoring technique and reviewed goal blood pressure. Recommended to check home blood pressure and heart rate daily -Continue spironolactone  12.5mg  daily, restart valsartan  320mg  daily, increase carvedilol  to 6.25mg  BID (order sent by PCP today for dose increase)  Hyperlipidemia/ASCVD Risk Reduction: -Currently uncontrolled.  -Patient is eligible for [employer] for hypercholesteremia that will assist with Repatha  copay and take her cost to $0.  I am working on program enrollment for her and will provide information to pharmacy once order is sent for Repatha  from Dr. Mona (pending for  him to sign currently)  Heart Failure: -Currently appropriately managed once patient has access to prescribed medications -Reviewed appropriate blood pressure monitoring technique and reviewed goal blood pressure -Reviewed to weigh daily  -Patient has valsartan  320mg  and will resume; she will continue spironolactone  12.5mg  daily; will increase carvedilol  to 6.25mg  BID (updated order sent by PCP today); and she will resume Farxiga  10mg  daily  -Patient eligible for [employer] for cardiomyopathy that will assist with Farxiga  copay and take her cost to $0.  I am working on program enrollment for her and will provide information to pharmacy to bill secondary to insurance.   COPD: -Currently controlled but patient needs daily maintenance inhaler on board -Test claim reflects  prescription sent for Wixela should be $12 copay, which patient states is affordable for her  Follow Up Plan: Once order for Repatha  signed by Dr. Mona, I will contact pharmacy, and then follow-up with patient  Channing DELENA Mealing, PharmD, DPLA

## 2024-07-19 ENCOUNTER — Telehealth: Payer: Self-pay

## 2024-07-19 MED ORDER — REPATHA SURECLICK 140 MG/ML ~~LOC~~ SOAJ: 140.0000 mg | SUBCUTANEOUS | 3 refills | Status: AC

## 2024-07-19 NOTE — Progress Notes (Signed)
   07/19/2024  Patient ID: [name], female   DOB: [birth year], 73 y.o.   MRN: [id number]  [facility] the following [facility]:  -Carvedilol  6.25mg  BID (dose increase)- picked up this morning -Wixela 100-50mcg (to replace Advair due to cost)- pharmacy filling for $5 -Farxiga  10mg - provided [employer] processing information, and pharmacy is now filling for $0 -Repatha  140mg - provided [employer] processing information, and pharmacy is now filling for $0  Contacted patient to inform her of the above information, and a follow-up with me is already scheduled for 8/6.  Channing DELENA Mealing, PharmD, DPLA

## 2024-07-25 ENCOUNTER — Ambulatory Visit: Admitting: Pharmacist

## 2024-07-31 NOTE — Progress Notes (Unsigned)
   07/31/2024  Patient ID: Meghan Lynn, female   DOB: [birth year], 73 y.o.   MRN: [id number]  Subjective/Objective Telephone visit to follow-up on management of chronic conditions  Hypertension: Current medications: carvedilol  6.25mg  BID, valsartan  320mg  daily, spironolactone  12.5mg  daily -Carvediolol 6.25mg  BID has been picked up, but patient has been taking this dose once daily and 3.125mg  once daily -Did resume valsartan  320mg  daily -Patient has a validated, automated, upper arm home BP cuff but she does not provide home readings -Patient has been monitoring home BP and states readings are coming down but still averaging 150/70 -Of note, patient takes valsartan  and spironolactone  12.5mg  at bedtime and checks BP first thing in the morning   Hyperlipidemia/ASCVD Risk Reduction Current lipid lowering medications: Repatha  140mg  every 14 days -Medications tried in the past: statins cause severe muscle cramps that have required her calling EMS in the past -Patient did pick up Repatha  and has had 1 dose of medication so far Antiplatelet regimen: ASA 81mg  daily, clopidogrel  75mg  daily -Recently resumed clopidogrel  at 75mg  daily    Heart Failure (EF 40-50%): Current medications:  ACEi/ARB/ARNI: Valsartan  320mg   SGLT2i: Farxiga  10mg  daily  Beta blocker: carvedilol  6.25mg  BID  Mineralocorticoid Receptor Antagonist: sprinoloactone 12.5mg  daily Diuretic regimen: furosemide  40mg  PRN   -Patient has picked Farxiga  up and started using once daily at bedftime.  Does endorse increased urination making her have to get up 2-3 times during the night -Has not needed furosemide  recently; does not report any swelling of ankles/feet and states she has actually lost 2lbs   COPD: Current medications:  Wixela 100/17mcg  -No recent exacerbations noted -Patient has picked up Wixela and began using 1 puff BID -Currently has Advair HFA rescue inhaler on hand to use as needed but has not needed  recently  Assessment/Plan:    Hypertension: -Currently uncontrolled -Advised patient to stop carvedilol  3.125mg  and start using 6.25mg  BID -Begin taking valsartan  320mg  and spironolactone  12.5mg  in the morning -Take BP 30 minutes-1 hour after morning medications   Hyperlipidemia/ASCVD Risk Reduction: -Currently uncontrolled.  -I recommend follow-up lipid panel in another 10-12 weeks   Heart Failure: -Currently appropriately managed  -Continue current regimen, but start taking Farxiga  10mg  in the morning to prevent increased nighttime urination -Continue to monitor BP and weight regularly   COPD: -Currently controlled  -Continue current regimen   Follow Up Plan: 4 weeks   Meghan Lynn, PharmD, DPLA

## 2024-08-01 ENCOUNTER — Other Ambulatory Visit: Payer: Self-pay

## 2024-08-01 DIAGNOSIS — I10 Essential (primary) hypertension: Secondary | ICD-10-CM

## 2024-08-17 ENCOUNTER — Ambulatory Visit

## 2024-08-17 DIAGNOSIS — Z0000 Encounter for general adult medical examination without abnormal findings: Secondary | ICD-10-CM | POA: Diagnosis not present

## 2024-08-17 NOTE — Patient Instructions (Signed)
 Meghan Lynn , Thank you for taking time out of your busy schedule to complete your Annual Wellness Visit with me. I enjoyed our conversation and look forward to speaking with you again next year. I, as well as your care team,  appreciate your ongoing commitment to your health goals. Please review the following plan we discussed and let me know if I can assist you in the future. Your Game plan/ To Do List    Referrals: If you haven't heard from the office you've been referred to, please reach out to them at the phone provided.   Follow up Visits: We will see or speak with you next year for your Next Medicare AWV with our clinical staff Have you seen your provider in the last 6 months (3 months if uncontrolled diabetes)? Yes  Clinician Recommendations:  Aim for 30 minutes of exercise or brisk walking, 6-8 glasses of water, and 5 servings of fruits and vegetables each day.       This is a list of the screenings recommended for you:  Health Maintenance  Topic Date Due   COVID-19 Vaccine (6 - 2024-25 season) 08/28/2023   Flu Shot  07/27/2024   Zoster (Shingles) Vaccine (1 of 2) 10/10/2024*   Pneumococcal Vaccine for age over 27 (1 of 2 - PCV) 01/03/2025*   DTaP/Tdap/Td vaccine (1 - Tdap) 01/10/2025*   Mammogram  01/25/2025   Screening for Lung Cancer  02/18/2025   Medicare Annual Wellness Visit  08/17/2025   Colon Cancer Screening  06/21/2030   DEXA scan (bone density measurement)  Completed   Hepatitis C Screening  Completed   HPV Vaccine  Aged Out   Meningitis B Vaccine  Aged Out  *Topic was postponed. The date shown is not the original due date.    Advanced directives: (Copy Requested) Please bring a copy of your health care power of attorney and living will to the office to be added to your chart at your convenience. You can mail to [facility] [address] [place], [place] [address] or email to [email or web address] Advance Care Planning is important because  it:  [x]  Makes sure you receive the medical care that is consistent with your values, goals, and preferences  [x]  It provides guidance to your family and loved ones and reduces their decisional burden about whether or not they are making the right decisions based on your wishes.  Follow the link provided in your after visit summary or read over the paperwork we have mailed to you to help you started getting your Advance Directives in place. If you need assistance in completing these, please reach out to us  so that we can help you!  See attachments for Preventive Care and Fall Prevention Tips.

## 2024-08-17 NOTE — Progress Notes (Signed)
 Subjective:   Meghan Lynn is a 73 y.o. who presents for a Medicare Wellness preventive visit.  As a reminder, Annual Wellness Visits don't include a physical exam, and some assessments may be limited, especially if this visit is performed virtually. We may recommend an in-person follow-up visit with your provider if needed.  Visit Complete: Virtual I connected with  Meghan Lynn on 08/17/24 by a audio enabled telemedicine application and verified that I am speaking with the correct person using two identifiers.  Patient Location: Home  Provider Location: Office/Clinic  I discussed the limitations of evaluation and management by telemedicine. The patient expressed understanding and agreed to proceed.  Vital Signs: Because this visit was a virtual/telehealth visit, some criteria may be missing or patient reported. Any vitals not documented were not able to be obtained and vitals that have been documented are patient reported.  VideoError- Librarian, academic were attempted between this provider and patient, however failed, due to patient having technical difficulties OR patient did not have access to video capability.  We continued and completed visit with audio only.   Persons Participating in Visit: Patient.  AWV Questionnaire: No: Patient Medicare AWV questionnaire was not completed prior to this visit.  Cardiac Risk Factors include: advanced age (>4men, >80 women);dyslipidemia;hypertension     Objective:    Today's Vitals   08/17/24 1456  PainSc: 7    There is no height or weight on file to calculate BMI.     08/17/2024    3:08 PM 02/10/2024    6:54 AM 02/07/2024    1:37 PM 02/06/2024   10:24 AM 06/21/2020   10:47 AM 06/16/2020    9:19 AM 04/14/2020    2:21 PM  Advanced Directives  Does Patient Have a Medical Advance Directive? Yes No No No No No No  Type of Estate agent of [place];Living will        Copy of  Healthcare Power of Attorney in Chart? No - copy requested        Would patient like information on creating a medical advance directive?  No - Patient declined No - Patient declined   No - Patient declined No - Patient declined    Current Medications (verified) Outpatient Encounter Medications as of 08/17/2024  Medication Sig   albuterol  (VENTOLIN  HFA) 108 (90 Base) MCG/ACT inhaler Inhale 2 puffs every 4-6 hours as needed for shortness of breath or wheezing   aspirin  EC 81 MG tablet Take 1 tablet (81 mg total) by mouth daily. Swallow whole.   Biotin 1 MG CAPS Take 1 mg by mouth daily.    carvedilol  (COREG ) 6.25 MG tablet Take 1 tablet (6.25 mg total) by mouth 2 (two) times daily with a meal.   clopidogrel  (PLAVIX ) 75 MG tablet Take 1 tablet (75 mg total) by mouth daily.   dapagliflozin  propanediol (FARXIGA ) 10 MG TABS tablet Take 1 tablet (10 mg total) by mouth daily.   Evolocumab  (REPATHA  SURECLICK) 140 MG/ML SOAJ Inject 140 mg into the skin every 14 (fourteen) days.   fluticasone -salmeterol (WIXELA INHUB ) 100-50 MCG/ACT AEPB Inhale 1 puff into the lungs 2 (two) times daily.   furosemide  (LASIX ) 40 MG tablet Take 1 tablet (40 mg total) by mouth daily. X 7 days, then transition to daily as needed for weight gain of 3-5 lbs in 24-48 hours   Multiple Vitamin (MULTIVITAMIN WITH MINERALS) TABS Take 1 tablet by mouth daily.   nitroGLYCERIN  (NITROSTAT ) 0.4 MG SL  tablet Place 1 tablet (0.4 mg total) under the tongue every 5 (five) minutes as needed for chest pain.   omeprazole (PRILOSEC) 20 MG capsule Take 20 mg by mouth daily.   potassium chloride  SA (KLOR-CON  M) 20 MEQ tablet Take 1 tablet (20 mEq total) by mouth daily. X 7 days, then use only on days you take Lasix    spironolactone  (ALDACTONE ) 25 MG tablet Take 0.5 tablets (12.5 mg total) by mouth daily.   tiZANidine (ZANAFLEX) 2 MG tablet Take 1 tablet (2 mg total) by mouth every 6 (six) hours as needed for muscle spasms.   valsartan  (DIOVAN ) 320  MG tablet Take 1 tablet (320 mg total) by mouth daily.   No facility-administered encounter medications on file as of 08/17/2024.    Allergies (verified) Rosuvastatin , Atorvastatin , Latex, and Propoxyphene   History: Past Medical History:  Diagnosis Date   Active smoker    Anginal pain (HCC)    Arthritis    Asthma    patient denies   Bronchitis due to tobacco use    Burning sensation of feet    Coronary artery disease    3 stents to RCA   Dilatation of esophagus    Gastritis    GERD (gastroesophageal reflux disease)    H/O pyloric stenosis 01/31/2020   Headache(784.0)    cluster every day   Headache, migraine    daily   Hyperlipidemia    Hypertension    Lower GI bleed 06/20/2020   Peptic ulcer disease    Pneumonia    Shortness of breath dyspnea    with exertion   Past Surgical History:  Procedure Laterality Date   BACK SURGERY     BALLOON DILATION N/A 05/30/2013   Procedure: BALLOON DILATION;  Surgeon: Norleen JAYSON Hint, MD;  Location: [facility] ENDOSCOPY;  Service: Endoscopy;  Laterality: N/A;   BALLOON DILATION N/A 08/02/2013   Procedure: BALLOON DILATION;  Surgeon: Norleen JAYSON Hint, MD;  Location: [facility] ENDOSCOPY;  Service: Endoscopy;  Laterality: N/A;   BALLOON DILATION N/A 04/29/2016   Procedure: BALLOON DILATION;  Surgeon: Norleen Hint, MD;  Location: [facility] ENDOSCOPY;  Service: Endoscopy;  Laterality: N/A;   BALLOON DILATION N/A 06/16/2020   Procedure: BALLOON DILATION;  Surgeon: Saintclair Jasper, MD;  Location: [facility] ENDOSCOPY;  Service: Gastroenterology;  Laterality: N/A;   BIOPSY  06/16/2020   Procedure: BIOPSY;  Surgeon: Saintclair Jasper, MD;  Location: [facility] ENDOSCOPY;  Service: Gastroenterology;;   CARDIAC CATHETERIZATION  03/07/2002   moderte CAD 80% (mid RCA) - Dr. Elsie Ona   CARDIAC CATHETERIZATION  09/13/2003   Cypher 2.5x55mm and 2.5x18 and Taxus 2.75x80mm to distal, mid, prox RCA (Dr. FABIENE Hasten)   CARDIOLITE MYOCARDIAL PERFUSION STUDY  05/2006   positive bruce protocol, low risk, EF 80%    CARDIOVASCULAR STRESS TEST  2010   COLONOSCOPY     COLONOSCOPY WITH PROPOFOL  N/A 06/16/2020   Procedure: COLONOSCOPY WITH PROPOFOL ;  Surgeon: Saintclair Jasper, MD;  Location: [facility] ENDOSCOPY;  Service: Gastroenterology;  Laterality: N/A;   COLONOSCOPY WITH PROPOFOL  N/A 06/21/2020   Procedure: COLONOSCOPY WITH PROPOFOL ;  Surgeon: Elicia Claw, MD;  Location: [facility] ENDOSCOPY;  Service: Gastroenterology;  Laterality: N/A;   CORONARY ANGIOPLASTY  2004   stents x 3   CORONARY ARTERY BYPASS GRAFT N/A 02/10/2024   Procedure: CORONARY ARTERY BYPASS GRAFTING (CABG) TIMES THREE USING LEFT INTERNAL MAMMARY ARTERY AND ENDOSCOPICALLY HARVESTED RIGHT GREATER SAPHENOUS VEIN.;  Surgeon: Kerrin Elspeth JAYSON, MD;  Location: [facility] OR;  Service: Open Heart Surgery;  Laterality:  N/A;   DILATATION & CURETTAGE/HYSTEROSCOPY WITH MYOSURE N/A 08/19/2016   Procedure: DILATATION & CURETTAGE/HYSTEROSCOPY WITH MYOSURE;  Surgeon: Dickie Carder, MD;  Location: [facility] ORS;  Service: Gynecology;  Laterality: N/A;   ESOPHAGOGASTRODUODENOSCOPY N/A 05/30/2013   Procedure: ESOPHAGOGASTRODUODENOSCOPY (EGD);  Surgeon: Norleen JAYSON Hint, MD;  Location: [facility] ENDOSCOPY;  Service: Endoscopy;  Laterality: N/A;   ESOPHAGOGASTRODUODENOSCOPY N/A 08/02/2013   Procedure: ESOPHAGOGASTRODUODENOSCOPY (EGD);  Surgeon: Norleen JAYSON Hint, MD;  Location: [facility] ENDOSCOPY;  Service: Endoscopy;  Laterality: N/A;  barb /ja   ESOPHAGOGASTRODUODENOSCOPY (EGD) WITH PROPOFOL  N/A 04/29/2016   Procedure: ESOPHAGOGASTRODUODENOSCOPY (EGD) WITH PROPOFOL ;  Surgeon: Norleen Hint, MD;  Location: [facility] ENDOSCOPY;  Service: Endoscopy;  Laterality: N/A;   ESOPHAGOGASTRODUODENOSCOPY (EGD) WITH PROPOFOL  N/A 06/16/2020   Procedure: ESOPHAGOGASTRODUODENOSCOPY (EGD) WITH PROPOFOL ;  Surgeon: Saintclair Jasper, MD;  Location: [facility] ENDOSCOPY;  Service: Gastroenterology;  Laterality: N/A;   FRACTURE SURGERY     HEMOSTASIS CLIP PLACEMENT  06/21/2020   Procedure: HEMOSTASIS CLIP PLACEMENT;  Surgeon: Elicia Claw, MD;   Location: [facility] ENDOSCOPY;  Service: Gastroenterology;;   IABP INSERTION Right 02/08/2024   Procedure: IABP Insertion;  Surgeon: Wendel Lurena POUR, MD;  Location: [facility] INVASIVE CV LAB;  Service: Cardiovascular;  Laterality: Right;   LEFT HEART CATH AND CORONARY ANGIOGRAPHY N/A 02/08/2024   Procedure: LEFT HEART CATH AND CORONARY ANGIOGRAPHY;  Surgeon: Wendel Lurena POUR, MD;  Location: [facility] INVASIVE CV LAB;  Service: Cardiovascular;  Laterality: N/A;   ORIF ELBOW FRACTURE  2006   POLYPECTOMY  06/16/2020   Procedure: POLYPECTOMY;  Surgeon: Saintclair Jasper, MD;  Location: [facility] ENDOSCOPY;  Service: Gastroenterology;;   POLYPECTOMY  06/21/2020   Procedure: POLYPECTOMY;  Surgeon: Elicia Claw, MD;  Location: [facility] ENDOSCOPY;  Service: Gastroenterology;;   TEE WITHOUT CARDIOVERSION N/A 02/10/2024   Procedure: TRANSESOPHAGEAL ECHOCARDIOGRAM (TEE);  Surgeon: Kerrin Elspeth JAYSON, MD;  Location: [facility] OR;  Service: Open Heart Surgery;  Laterality: N/A;   TRANSTHORACIC ECHOCARDIOGRAM  07/2008   normal LV function, mild MR, mild TR, trace pulm valve regurg   Family History  Problem Relation Age of Onset   Diabetes Mother    CAD Mother        CABG   Bone cancer Father    Hypertension Father    Heart disease Brother    Kidney disease Sister    Cancer Sister    Diabetes Sister    Hyperlipidemia Sister    Hypertension Sister    Social History   Socioeconomic History   Marital status: Widowed    Spouse name: Not on file   Number of children: 3   Years of education: 12   Highest education level: Not on file  Occupational History    Employer: [employer]  Tobacco Use   Smoking status: Former    Current packs/day: 0.60    Average packs/day: 0.6 packs/day for 45.0 years (27.0 ttl pk-yrs)    Types: Cigarettes   Smokeless tobacco: Never   Tobacco comments:    Smokes 7 packs a week of cigarettes. 05/11/23 Tay  Vaping Use   Vaping status: Never Used  Substance and Sexual Activity   Alcohol use: Not Currently     Comment: occ   Drug use: No   Sexual activity: Not Currently    Birth control/protection: Abstinence  Other Topics Concern   Not on file  Social History Narrative   Epworth Sleepiness scale score: 5   Social Drivers of Health   Financial Resource Strain: Low Risk  (08/17/2024)   Overall Financial  Resource Strain (CARDIA)    Difficulty of Paying Living Expenses: Not hard at all  Food Insecurity: No Food Insecurity (08/17/2024)   Hunger Vital Sign    Worried About Running Out of Food in the Last Year: Never true    Ran Out of Food in the Last Year: Never true  Transportation Needs: No Transportation Needs (08/17/2024)   PRAPARE - Administrator, Civil Service (Medical): No    Lack of Transportation (Non-Medical): No  Physical Activity: Insufficiently Active (08/17/2024)   Exercise Vital Sign    Days of Exercise per Week: 7 days    Minutes of Exercise per Session: 20 min  Stress: No Stress Concern Present (08/17/2024)   [employer] of Occupational Health - Occupational Stress Questionnaire    Feeling of Stress: Not at all  Social Connections: Moderately Integrated (08/17/2024)   Social Connection and Isolation Panel    Frequency of Communication with Friends and Family: More than three times a week    Frequency of Social Gatherings with Friends and Family: Once a week    Attends Religious Services: More than 4 times per year    Active Member of [employer] or Organizations: Yes    Attends Banker Meetings: More than 4 times per year    Marital Status: Widowed  Recent Concern: Social Connections - Moderately Isolated (08/17/2024)   Social Connection and Isolation Panel    Frequency of Communication with Friends and Family: More than three times a week    Frequency of Social Gatherings with Friends and Family: Once a week    Attends Religious Services: More than 4 times per year    Active Member of [employer] or Organizations: No    Attends Banker  Meetings: Never    Marital Status: Widowed    Tobacco Counseling Counseling given: Not Answered Tobacco comments: Smokes 7 packs a week of cigarettes. 05/11/23 Tay    Clinical Intake:  Pre-visit preparation completed: Yes  Pain : 0-10 Pain Score: 7  Pain Type: Chronic pain Pain Location: Hip Pain Orientation: Left Pain Radiating Towards: down the leg Pain Descriptors / Indicators: Nagging Pain Onset: More than a month ago Pain Frequency: Constant     Nutritional Risks: None Diabetes: No  Lab Results  Component Value Date   HGBA1C 6.2 (H) 02/07/2024   HGBA1C 6.1 (H) 09/30/2015     How often do you need to have someone help you when you read instructions, pamphlets, or other written materials from your doctor or pharmacy?: 1 - Never  Interpreter Needed?: No  Information entered by :: NAllen LPN   Activities of Daily Living     08/17/2024    2:57 PM 02/07/2024    1:38 PM  In your present state of health, do you have any difficulty performing the following activities:  Hearing? 0 0  Vision? 0 0  Difficulty concentrating or making decisions? 1 0  Comment memory sometimes   Walking or climbing stairs? 0   Dressing or bathing? 0   Doing errands, shopping? 0 0  Preparing Food and eating ? N   Using the Toilet? N   In the past six months, have you accidently leaked urine? Y   Comment with coughing   Do you have problems with loss of bowel control? N   Managing your Medications? N   Managing your Finances? N   Housekeeping or managing your Housekeeping? N     Patient Care Team: Billy Knee, FNP  as PCP - General (Internal Medicine) Mona Vinie BROCKS, MD as PCP - Cardiology (Cardiology)  I have updated your Care Teams any recent Medical Services you may have received from other providers in the past year.     Assessment:   This is a routine wellness examination for [facility].  Hearing/Vision screen Hearing Screening - Comments:: Denies hearing issues Vision  Screening - Comments:: Regular eye exams   Goals Addressed             This Visit's Progress    Patient Stated       08/17/2024, wants to get rid of pain       Depression Screen     08/17/2024    3:11 PM 07/03/2024   10:52 AM 03/23/2024    9:42 AM 04/14/2020    2:24 PM 03/13/2020    3:58 PM 01/31/2020    8:59 AM 12/26/2019    8:07 AM  PHQ 2/9 Scores  PHQ - 2 Score 0 2 0 0 0 0 0  PHQ- 9 Score   0        Fall Risk     08/17/2024    3:09 PM 07/03/2024   10:52 AM 03/23/2024    9:42 AM 07/07/2020    4:16 PM 04/14/2020    2:24 PM  Fall Risk   Falls in the past year? 0 1 0 0 0   Number falls in past yr: 0 1 0 0 0  Injury with Fall? 0 0 0 0 0  Risk for fall due to : Medication side effect History of fall(s) No Fall Risks    Follow up Falls evaluation completed;Falls prevention discussed Falls prevention discussed Falls prevention discussed  Falls evaluation completed;Education provided      Data saved with a previous flowsheet row definition    MEDICARE RISK AT HOME:  Medicare Risk at Home Any stairs in or around the home?: Yes If so, are there any without handrails?: No Home free of loose throw rugs in walkways, pet beds, electrical cords, etc?: Yes Adequate lighting in your home to reduce risk of falls?: Yes Life alert?: No Use of a cane, walker or w/c?: No Grab bars in the bathroom?: No Shower chair or bench in shower?: Yes Elevated toilet seat or a handicapped toilet?: No  TIMED UP AND GO:  Was the test performed?  No  Cognitive Function: 6CIT completed        08/17/2024    3:11 PM 04/14/2020    2:22 PM  6CIT Screen  What Year? 0 points 0 points  What month? 0 points 0 points  What time? 0 points 0 points  Count back from 20 0 points 0 points  Months in reverse 0 points 0 points  Repeat phrase 2 points 0 points  Total Score 2 points 0 points    Immunizations Immunization History  Administered Date(s) Administered   Fluad Quad(high Dose 65+) 12/26/2019,  09/06/2022   Influenza, High Dose Seasonal PF 09/26/2018   Moderna Sars-Covid-2 Vaccination 01/05/2020, 02/02/2020, 02/25/2020, 01/07/2021   Pfizer(Comirnaty)Fall Seasonal Vaccine 12 years and older 11/22/2022    Screening Tests Health Maintenance  Topic Date Due   COVID-19 Vaccine (6 - 2024-25 season) 08/28/2023   INFLUENZA VACCINE  07/27/2024   Zoster Vaccines- Shingrix (1 of 2) 10/10/2024 (Originally 06/06/1970)   Pneumococcal Vaccine: 50+ Years (1 of 2 - PCV) 01/03/2025 (Originally 06/06/1970)   DTaP/Tdap/Td (1 - Tdap) 01/10/2025 (Originally 06/06/1970)   MAMMOGRAM  01/25/2025  Lung Cancer Screening  02/18/2025   Medicare Annual Wellness (AWV)  08/17/2025   Colonoscopy  06/21/2030   DEXA SCAN  Completed   Hepatitis C Screening  Completed   HPV VACCINES  Aged Out   Meningococcal B Vaccine  Aged Out    Health Maintenance  Health Maintenance Due  Topic Date Due   COVID-19 Vaccine (6 - 2024-25 season) 08/28/2023   INFLUENZA VACCINE  07/27/2024   Health Maintenance Items Addressed: Due for flu vaccine. Declines covid vaccine.  Additional Screening:  Vision Screening: Recommended annual ophthalmology exams for early detection of glaucoma and other disorders of the eye. Would you like a referral to an eye doctor? No    Dental Screening: Recommended annual dental exams for proper oral hygiene  Community Resource Referral / Chronic Care Management: CRR required this visit?  No   CCM required this visit?  No   Plan:    I have personally reviewed and noted the following in the patient's chart:   Medical and social history Use of alcohol, tobacco or illicit drugs  Current medications and supplements including opioid prescriptions. Patient is not currently taking opioid prescriptions. Functional ability and status Nutritional status Physical activity Advanced directives List of other physicians Hospitalizations, surgeries, and ER visits in previous 12  months Vitals Screenings to include cognitive, depression, and falls Referrals and appointments  In addition, I have reviewed and discussed with patient certain preventive protocols, quality metrics, and best practice recommendations. A written personalized care plan for preventive services as well as general preventive health recommendations were provided to patient.   Ardella FORBES Dawn, LPN   1/[id number]/[id number]   After Visit Summary: (MyChart) Due to this being a telephonic visit, the after visit summary with patients personalized plan was offered to patient via MyChart   Notes: Nothing significant to report at this time.

## 2024-08-20 ENCOUNTER — Ambulatory Visit: Admitting: Internal Medicine

## 2024-08-21 ENCOUNTER — Ambulatory Visit: Payer: Self-pay

## 2024-08-21 NOTE — Telephone Encounter (Signed)
 Patient scheduled for an appointment 08/22/2024 at 9:20 AM.  Dm/cma

## 2024-08-21 NOTE — Telephone Encounter (Signed)
 FYI Only or Action Required?: FYI only for provider.  Patient was last seen in primary care on 07/10/2024 by Billy Knee, FNP.  Called Nurse Triage reporting Foot Injury.  Symptoms began today.  Interventions attempted: Other: clean wound.  Symptoms are: unchanged.  Triage Disposition: See Physician Within 24 Hours  Patient/caregiver understands and will follow disposition?: Yes  Copied from CRM #[id number]. Topic: Clinical - Red Word Triage >> Aug 21, 2024  2:31 PM Meghan Lynn wrote: Red Word that prompted transfer to Nurse Triage: Patient stated she stepped on a rusty nail and would like to come in for a Tetanus shot. Reason for Disposition  [1] Last tetanus shot > 10 years ago AND [2] CLEAN puncture (e.g., object AND skin were clean)  Answer Assessment - Initial Assessment Questions 1. LOCATION: Where is the puncture located?      Left ball of foot 2. OBJECT: What was the object that punctured the skin?     Rusty garden staple 3. DEPTH: How deep do you think the puncture goes?      unsure 4. ONSET: When did the injury occur? (e.g, minutes, hours)     today 5. PAIN: Is there any pain? If Yes, ask: How bad is the pain? (Scale 0-10; or none, mild, moderate, severe)     denies 6. FOREIGN BODY SENSATION:  Did any of the object stay in the skin? Do you feel like something is still in the wound?     N/A 7. TETANUS: When was your last tetanus booster?     Needs tetanus 8. PREGNANCY: Is there any chance you are pregnant? When was your last menstrual period?     N/A  Protocols used: Puncture Wound-A-AH

## 2024-08-22 ENCOUNTER — Encounter: Payer: Self-pay | Admitting: Family Medicine

## 2024-08-22 ENCOUNTER — Ambulatory Visit: Admitting: Family Medicine

## 2024-08-22 VITALS — BP 132/76 | HR 76 | Temp 97.0°F | Resp 18 | Wt 127.6 lb

## 2024-08-22 DIAGNOSIS — Z23 Encounter for immunization: Secondary | ICD-10-CM

## 2024-08-22 DIAGNOSIS — S91332A Puncture wound without foreign body, left foot, initial encounter: Secondary | ICD-10-CM

## 2024-08-22 NOTE — Patient Instructions (Signed)
  VISIT SUMMARY: Today, you were seen for a puncture wound on your left foot that you sustained while gardening. A rusty staple penetrated through your shoe and into your foot. You cleaned the wound and applied antibiotic cream and a Band-Aid. You reported a tingling sensation but no significant pain or drainage from the wound.  YOUR PLAN: -PUNCTURE WOUND WITH POSSIBLE RETAINED FOREIGN BODY OF LEFT FOOT: A puncture wound is a deep wound caused by a sharp object. In your case, a rusty staple caused the wound. There is a concern that part of the staple might still be in your foot, which could lead to an infection. You received a tetanus shot today. If you notice any signs of infection such as increased pain, fever, drainage, or redness, you should seek medical attention immediately. An x-ray may be needed if symptoms of infection develop to check for any remaining foreign body.  INSTRUCTIONS: Please monitor your foot for any signs of infection, including increased pain, fever, drainage, or redness. If you notice any of these symptoms, seek medical attention immediately. You received a tetanus shot today, which will help prevent infection. If symptoms of infection develop, an x-ray may be needed to check for any remaining foreign body.

## 2024-08-22 NOTE — Progress Notes (Signed)
 Assessment & Plan   Assessment/Plan:    Assessment & Plan Puncture wound with possible retained foreign body of left foot Puncture wound on the left foot sustained while gardening, with a staple penetrating through the shoe. The wound occurred yesterday. There is no visible puncture wound or drainage, and the foot appears to be healing well. She reports a tingling sensation but no significant pain. Discussed concern for a possible retained foreign body due to the staple being rusty and penetrating the shoe.Discussed risk of infection associated with foreign body she has good sensation in the foot despite previous numbness from surgery. The risk of infection is present, especially if a foreign body is retained, as oral antibiotics would not be effective in such a case. - Administer tetanus shot. - Recommended getting a left foot xray to rule out retained foreign body, but patient declined. if symptoms of infection develop. - Monitor for signs of infection such as pain, fever, drainage, or redness.      There are no discontinued medications.  Return if symptoms worsen or fail to improve.        Subjective:   Encounter date: 08/22/2024  Meghan Lynn is a 73 y.o. female who has Spondylolisthesis of l3-4; Coronary artery disease due to lipid rich plaque; Essential hypertension; Dyslipidemia; GERD (gastroesophageal reflux disease); Shortness of breath; Weakness of both lower extremities; Peptic ulcer disease; Hyperlipidemia; Dilatation of esophagus; Burning sensation of feet; Bronchitis due to tobacco use; Asthma; Arthritis; Symptomatic anemia; NSTEMI (non-ST elevated myocardial infarction) (HCC); Erythrocytosis; S/P CABG x 3; Former tobacco use; Chronic obstructive pulmonary disease (HCC); Lumbar radiculopathy; Intractable chronic migraine without aura and without status migrainosus; Statin intolerance; and Puncture wound of left foot on their problem list..   She  has a past medical  history of Active smoker, Anginal pain (HCC), Arthritis, Asthma, Bronchitis due to tobacco use, Burning sensation of feet, Coronary artery disease, Dilatation of esophagus, Gastritis, GERD (gastroesophageal reflux disease), H/O pyloric stenosis (01/31/2020), Headache(784.0), Headache, migraine, Hyperlipidemia, Hypertension, Lower GI bleed (06/20/2020), Peptic ulcer disease, Pneumonia, and Shortness of breath dyspnea..   She presents with chief complaint of Puncture Wound (Pt c/o of foot puncture due to rusted staples while cleaning flower bed yesterday; no pain or swelling is present, she used OTC antibiotic ointment.//HM due- TDAP vaccine ) .   Discussed the use of AI scribe software for clinical note transcription with the patient, who gave verbal consent to proceed.  History of Present Illness Meghan Lynn is a 73 year old female who presents with a puncture wound to the foot after stepping on a staple.  She sustained the puncture wound yesterday while working in her garden when a staple penetrated through her tennis shoe and into her foot. She attempted to make the wound bleed by applying pressure but was unable to do so, which concerned her as she describes herself as a 'free bleeder'. She cleaned the wound with water, applied triple antibiotic cream, and covered it with a Band-Aid.  The staple was rusty, and it has been over ten years since her last tetanus shot. She reports a 'little tingly pain' in the area but no significant pain, and there is no drainage from the wound. She mentions numbness in the leg from a previous surgery.  She has a history of sciatic nerve issues and numbness in the leg from prior heart surgery, which has persisted without a clear explanation. She is concerned about the possibility of a foreign body remaining in the  wound, although she did not observe any broken pieces of the staple.     ROS  Past Surgical History:  Procedure Laterality Date   BACK SURGERY      BALLOON DILATION N/A 05/30/2013   Procedure: BALLOON DILATION;  Surgeon: Norleen JAYSON Hint, MD;  Location: [facility] ENDOSCOPY;  Service: Endoscopy;  Laterality: N/A;   BALLOON DILATION N/A 08/02/2013   Procedure: BALLOON DILATION;  Surgeon: Norleen JAYSON Hint, MD;  Location: [facility] ENDOSCOPY;  Service: Endoscopy;  Laterality: N/A;   BALLOON DILATION N/A 04/29/2016   Procedure: BALLOON DILATION;  Surgeon: Norleen Hint, MD;  Location: [facility] ENDOSCOPY;  Service: Endoscopy;  Laterality: N/A;   BALLOON DILATION N/A 06/16/2020   Procedure: BALLOON DILATION;  Surgeon: Saintclair Jasper, MD;  Location: [facility] ENDOSCOPY;  Service: Gastroenterology;  Laterality: N/A;   BIOPSY  06/16/2020   Procedure: BIOPSY;  Surgeon: Saintclair Jasper, MD;  Location: [facility] ENDOSCOPY;  Service: Gastroenterology;;   CARDIAC CATHETERIZATION  03/07/2002   moderte CAD 80% (mid RCA) - Dr. Elsie Ona   CARDIAC CATHETERIZATION  09/13/2003   Cypher 2.5x32mm and 2.5x18 and Taxus 2.75x69mm to distal, mid, prox RCA (Dr. FABIENE Hasten)   CARDIOLITE MYOCARDIAL PERFUSION STUDY  05/2006   positive bruce protocol, low risk, EF 80%   CARDIOVASCULAR STRESS TEST  2010   COLONOSCOPY     COLONOSCOPY WITH PROPOFOL  N/A 06/16/2020   Procedure: COLONOSCOPY WITH PROPOFOL ;  Surgeon: Saintclair Jasper, MD;  Location: [facility] ENDOSCOPY;  Service: Gastroenterology;  Laterality: N/A;   COLONOSCOPY WITH PROPOFOL  N/A 06/21/2020   Procedure: COLONOSCOPY WITH PROPOFOL ;  Surgeon: Elicia Claw, MD;  Location: [facility] ENDOSCOPY;  Service: Gastroenterology;  Laterality: N/A;   CORONARY ANGIOPLASTY  2004   stents x 3   CORONARY ARTERY BYPASS GRAFT N/A 02/10/2024   Procedure: CORONARY ARTERY BYPASS GRAFTING (CABG) TIMES THREE USING LEFT INTERNAL MAMMARY ARTERY AND ENDOSCOPICALLY HARVESTED RIGHT GREATER SAPHENOUS VEIN.;  Surgeon: Kerrin Elspeth JAYSON, MD;  Location: [facility] OR;  Service: Open Heart Surgery;  Laterality: N/A;   DILATATION & CURETTAGE/HYSTEROSCOPY WITH MYOSURE N/A 08/19/2016   Procedure: DILATATION &  CURETTAGE/HYSTEROSCOPY WITH MYOSURE;  Surgeon: Dickie Carder, MD;  Location: [facility] ORS;  Service: Gynecology;  Laterality: N/A;   ESOPHAGOGASTRODUODENOSCOPY N/A 05/30/2013   Procedure: ESOPHAGOGASTRODUODENOSCOPY (EGD);  Surgeon: Norleen JAYSON Hint, MD;  Location: [facility] ENDOSCOPY;  Service: Endoscopy;  Laterality: N/A;   ESOPHAGOGASTRODUODENOSCOPY N/A 08/02/2013   Procedure: ESOPHAGOGASTRODUODENOSCOPY (EGD);  Surgeon: Norleen JAYSON Hint, MD;  Location: [facility] ENDOSCOPY;  Service: Endoscopy;  Laterality: N/A;  barb /ja   ESOPHAGOGASTRODUODENOSCOPY (EGD) WITH PROPOFOL  N/A 04/29/2016   Procedure: ESOPHAGOGASTRODUODENOSCOPY (EGD) WITH PROPOFOL ;  Surgeon: Norleen Hint, MD;  Location: [facility] ENDOSCOPY;  Service: Endoscopy;  Laterality: N/A;   ESOPHAGOGASTRODUODENOSCOPY (EGD) WITH PROPOFOL  N/A 06/16/2020   Procedure: ESOPHAGOGASTRODUODENOSCOPY (EGD) WITH PROPOFOL ;  Surgeon: Saintclair Jasper, MD;  Location: [facility] ENDOSCOPY;  Service: Gastroenterology;  Laterality: N/A;   FRACTURE SURGERY     HEMOSTASIS CLIP PLACEMENT  06/21/2020   Procedure: HEMOSTASIS CLIP PLACEMENT;  Surgeon: Elicia Claw, MD;  Location: [facility] ENDOSCOPY;  Service: Gastroenterology;;   IABP INSERTION Right 02/08/2024   Procedure: IABP Insertion;  Surgeon: Wendel Lurena POUR, MD;  Location: [facility] INVASIVE CV LAB;  Service: Cardiovascular;  Laterality: Right;   LEFT HEART CATH AND CORONARY ANGIOGRAPHY N/A 02/08/2024   Procedure: LEFT HEART CATH AND CORONARY ANGIOGRAPHY;  Surgeon: Wendel Lurena POUR, MD;  Location: [facility] INVASIVE CV LAB;  Service: Cardiovascular;  Laterality: N/A;   ORIF ELBOW FRACTURE  2006   POLYPECTOMY  06/16/2020  Procedure: POLYPECTOMY;  Surgeon: Saintclair Jasper, MD;  Location: [facility] ENDOSCOPY;  Service: Gastroenterology;;   POLYPECTOMY  06/21/2020   Procedure: POLYPECTOMY;  Surgeon: Elicia Claw, MD;  Location: [facility] ENDOSCOPY;  Service: Gastroenterology;;   TEE WITHOUT CARDIOVERSION N/A 02/10/2024   Procedure: TRANSESOPHAGEAL ECHOCARDIOGRAM (TEE);  Surgeon: Kerrin Elspeth BROCKS, MD;  Location: [facility] OR;  Service: Open Heart Surgery;  Laterality: N/A;   TRANSTHORACIC ECHOCARDIOGRAM  07/2008   normal LV function, mild MR, mild TR, trace pulm valve regurg    Outpatient Medications Prior to Visit  Medication Sig Dispense Refill   albuterol  (VENTOLIN  HFA) 108 (90 Base) MCG/ACT inhaler Inhale 2 puffs every 4-6 hours as needed for shortness of breath or wheezing 18 g 1   aspirin  EC 81 MG tablet Take 1 tablet (81 mg total) by mouth daily. Swallow whole.     Biotin 1 MG CAPS Take 1 mg by mouth daily.      carvedilol  (COREG ) 6.25 MG tablet Take 1 tablet (6.25 mg total) by mouth 2 (two) times daily with a meal. 180 tablet 0   clopidogrel  (PLAVIX ) 75 MG tablet Take 1 tablet (75 mg total) by mouth daily. 90 tablet 1   dapagliflozin  propanediol (FARXIGA ) 10 MG TABS tablet Take 1 tablet (10 mg total) by mouth daily. 90 tablet 1   Evolocumab  (REPATHA  SURECLICK) 140 MG/ML SOAJ Inject 140 mg into the skin every 14 (fourteen) days. 6 mL 3   fluticasone -salmeterol (WIXELA INHUB ) 100-50 MCG/ACT AEPB Inhale 1 puff into the lungs 2 (two) times daily. 60 each 3   furosemide  (LASIX ) 40 MG tablet Take 1 tablet (40 mg total) by mouth daily. X 7 days, then transition to daily as needed for weight gain of 3-5 lbs in 24-48 hours 30 tablet 1   Multiple Vitamin (MULTIVITAMIN WITH MINERALS) TABS Take 1 tablet by mouth daily.     nitroGLYCERIN  (NITROSTAT ) 0.4 MG SL tablet Place 1 tablet (0.4 mg total) under the tongue every 5 (five) minutes as needed for chest pain. 25 tablet 3   omeprazole (PRILOSEC) 20 MG capsule Take 20 mg by mouth daily.     potassium chloride  SA (KLOR-CON  M) 20 MEQ tablet Take 1 tablet (20 mEq total) by mouth daily. X 7 days, then use only on days you take Lasix  30 tablet 3   spironolactone  (ALDACTONE ) 25 MG tablet Take 0.5 tablets (12.5 mg total) by mouth daily. 90 tablet 1   tiZANidine (ZANAFLEX) 2 MG tablet Take 1 tablet (2 mg total) by mouth every 6 (six) hours as needed  for muscle spasms. 90 tablet 1   valsartan  (DIOVAN ) 320 MG tablet Take 1 tablet (320 mg total) by mouth daily. 90 tablet 3   No facility-administered medications prior to visit.    Family History  Problem Relation Age of Onset   Diabetes Mother    CAD Mother        CABG   Bone cancer Father    Hypertension Father    Heart disease Brother    Kidney disease Sister    Cancer Sister    Diabetes Sister    Hyperlipidemia Sister    Hypertension Sister     Social History   Socioeconomic History   Marital status: Widowed    Spouse name: Not on file   Number of children: 3   Years of education: 12   Highest education level: Not on file  Occupational History    Employer: [employer]  Tobacco Use   Smoking  status: Former    Current packs/day: 0.60    Average packs/day: 0.6 packs/day for 45.0 years (27.0 ttl pk-yrs)    Types: Cigarettes   Smokeless tobacco: Never   Tobacco comments:    Smokes 7 packs a week of cigarettes. 05/11/23 Tay  Vaping Use   Vaping status: Never Used  Substance and Sexual Activity   Alcohol use: Not Currently    Comment: occ   Drug use: No   Sexual activity: Not Currently    Birth control/protection: Abstinence  Other Topics Concern   Not on file  Social History Narrative   Epworth Sleepiness scale score: 5   Social Drivers of Corporate investment banker Strain: Low Risk  (08/17/2024)   Overall Financial Resource Strain (CARDIA)    Difficulty of Paying Living Expenses: Not hard at all  Food Insecurity: No Food Insecurity (08/17/2024)   Hunger Vital Sign    Worried About Running Out of Food in the Last Year: Never true    Ran Out of Food in the Last Year: Never true  Transportation Needs: No Transportation Needs (08/17/2024)   PRAPARE - Administrator, Civil Service (Medical): No    Lack of Transportation (Non-Medical): No  Physical Activity: Insufficiently Active (08/17/2024)   Exercise Vital Sign    Days of Exercise per Week: 7 days     Minutes of Exercise per Session: 20 min  Stress: No Stress Concern Present (08/17/2024)   [employer] of Occupational Health - Occupational Stress Questionnaire    Feeling of Stress: Not at all  Social Connections: Moderately Integrated (08/17/2024)   Social Connection and Isolation Panel    Frequency of Communication with Friends and Family: More than three times a week    Frequency of Social Gatherings with Friends and Family: Once a week    Attends Religious Services: More than 4 times per year    Active Member of [employer] or Organizations: Yes    Attends Banker Meetings: More than 4 times per year    Marital Status: Widowed  Recent Concern: Social Connections - Moderately Isolated (08/17/2024)   Social Connection and Isolation Panel    Frequency of Communication with Friends and Family: More than three times a week    Frequency of Social Gatherings with Friends and Family: Once a week    Attends Religious Services: More than 4 times per year    Active Member of [employer] or Organizations: No    Attends Banker Meetings: Never    Marital Status: Widowed  Intimate Partner Violence: Not At Risk (08/17/2024)   Humiliation, Afraid, Rape, and Kick questionnaire    Fear of Current or Ex-Partner: No    Emotionally Abused: No    Physically Abused: No    Sexually Abused: No                                                                                                  Objective:  Physical Exam: BP 132/76 (BP Location: Right Arm, Patient Position: Sitting, Cuff Size: Normal) Comment: recheck  Pulse 76  Temp (!) 97 F (36.1 C) (Temporal)   Resp 18   Wt 127 lb 9.6 oz (57.9 kg)   SpO2 97%   BMI 27.61 kg/m    Physical Exam GENERAL: Alert, cooperative, well developed, no acute distress. HEENT: Normocephalic, normal oropharynx, moist mucous membranes. CHEST: Clear to auscultation bilaterally, no wheezes, rhonchi, or crackles. CARDIOVASCULAR: Normal heart  rate and rhythm, S1 and S2 normal without murmurs. ABDOMEN: Soft, non-tender, non-distended, without organomegaly, normal bowel sounds. EXTREMITIES: No cyanosis or edema. Left Foot skin mildly abraded, no visible puncture wounds.  There is no tenderness, erythema, or drainage. NEUROLOGICAL: Cranial nerves grossly intact, moves all extremities without gross motor or sensory deficit.   Physical Exam  No results found.  Recent Results (from the past 2160 hours)  CBC with Differential/Platelet     Status: Abnormal   Collection Time: 07/05/24  8:58 AM  Result Value Ref Range   WBC 8.0 4.0 - 10.5 K/uL   RBC 6.69 (H) 3.87 - 5.11 Mil/uL   Hemoglobin 16.1 (H) 12.0 - 15.0 g/dL   HCT 48.1 (H) 63.9 - 53.9 %   MCV 77.4 (L) 78.0 - 100.0 fl   MCHC 31.1 30.0 - 36.0 g/dL   RDW 82.8 (H) 88.4 - 84.4 %   Platelets 267.0 150.0 - 400.0 K/uL   Neutrophils Relative % 62.6 43.0 - 77.0 %   Lymphocytes Relative 29.7 12.0 - 46.0 %   Monocytes Relative 7.0 3.0 - 12.0 %   Eosinophils Relative 0.0 0.0 - 5.0 %   Basophils Relative 0.7 0.0 - 3.0 %   Neutro Abs 5.0 1.4 - 7.7 K/uL   Lymphs Abs 2.4 0.7 - 4.0 K/uL   Monocytes Absolute 0.6 0.1 - 1.0 K/uL   Eosinophils Absolute 0.0 0.0 - 0.7 K/uL   Basophils Absolute 0.1 0.0 - 0.1 K/uL  Lipid panel     Status: Abnormal   Collection Time: 07/05/24  8:58 AM  Result Value Ref Range   Cholesterol 228 (H) 0 - 200 mg/dL    Comment: ATP III Classification       Desirable:  < 200 mg/dL               Borderline High:  200 - 239 mg/dL          High:  > = 759 mg/dL   Triglycerides 822.9 (H) 0.0 - 149.0 mg/dL    Comment: Normal:  <849 mg/dLBorderline High:  150 - 199 mg/dL   HDL 55.19 >60.99 mg/dL   VLDL 64.5 0.0 - 59.9 mg/dL   LDL Cholesterol 851 (H) 0 - 99 mg/dL   Total CHOL/HDL Ratio 5     Comment:                Men          Women1/2 Average Risk     3.4          3.3Average Risk          5.0          4.42X Average Risk          9.6          7.13X Average Risk           15.0          11.0                       NonHDL 183.12     Comment: NOTE:  Non-HDL  goal should be 30 mg/dL higher than patient's LDL goal (i.e. LDL goal of < 70 mg/dL, would have non-HDL goal of < 100 mg/dL)  Comprehensive metabolic panel with GFR     Status: Abnormal   Collection Time: 07/05/24  8:58 AM  Result Value Ref Range   Sodium 141 135 - 145 mEq/L   Potassium 3.8 3.5 - 5.1 mEq/L   Chloride 105 96 - 112 mEq/L   CO2 31 19 - 32 mEq/L   Glucose, Bld 104 (H) 70 - 99 mg/dL   BUN 11 6 - 23 mg/dL   Creatinine, Ser 8.96 0.40 - 1.20 mg/dL   Total Bilirubin 0.4 0.2 - 1.2 mg/dL   Alkaline Phosphatase 83 39 - 117 U/L   AST 19 0 - 37 U/L   ALT 12 0 - 35 U/L   Total Protein 7.5 6.0 - 8.3 g/dL   Albumin  4.0 3.5 - 5.2 g/dL   GFR 46.26 (L) >39.99 mL/min    Comment: Calculated using the CKD-EPI Creatinine Equation (2021)   Calcium  9.4 8.4 - 10.5 mg/dL        Beverley Adine Hummer, MD, MS

## 2024-08-24 ENCOUNTER — Other Ambulatory Visit: Payer: Self-pay | Admitting: Internal Medicine

## 2024-08-24 DIAGNOSIS — J449 Chronic obstructive pulmonary disease, unspecified: Secondary | ICD-10-CM

## 2024-08-29 ENCOUNTER — Other Ambulatory Visit: Payer: Self-pay

## 2024-08-29 DIAGNOSIS — I10 Essential (primary) hypertension: Secondary | ICD-10-CM

## 2024-08-29 NOTE — Progress Notes (Signed)
   08/29/2024  Patient ID: Meghan Lynn, female   DOB: [birth year], 73 y.o.   MRN: [id number]  Subjective/Objective Telephone visit to follow-up on management of chronic conditions  Hypertension: Current medications: carvedilol  6.25mg  BID, valsartan  320mg  daily, spironolactone  12.5mg  daily -Patient has increased carvedilol  to the dose increase of 6.25mg  BID and started taking spironolactone  and valsartan  in the morning versus bedtime -Patient has a validated, automated, upper arm home BP cuff and states BP this morning was 132/78,  When we spoke 4 weeks ago, this was averaging 150/70  Hyperlipidemia/ASCVD Risk Reduction Current lipid lowering medications: Repatha  140mg  every 14 days -Medications tried in the past: statins cause severe muscle cramps that have required her calling EMS in the past -Patient started Repatha  approximately 1 month ago and is tolerating well so far Antiplatelet regimen: ASA 81mg  daily, clopidogrel  75mg  daily -Recently resumed clopidogrel  at 75mg  daily    Heart Failure (EF 40-50%): Current medications:  ACEi/ARB/ARNI: Valsartan  320mg   SGLT2i: Farxiga  10mg  daily  Beta blocker: carvedilol  6.25mg  BID  Mineralocorticoid Receptor Antagonist: sprinoloactone 12.5mg  daily Diuretic regimen: furosemide  40mg  PRN   -Patient has started taking Farxiga  in the morning versus bedtime to prevent/decrease having to get up during the night to urinate -Has not needed furosemide  recently; does not report any swelling of ankles/feet and states she has actually lost weight (currently 127lbs) unintentionally recently.  Do not endorse any changes in appetite or meal patterns.   COPD: Current medications:  Wixela 100/47mcg  -No recent exacerbations noted -Patient has picked up Wixela and began using 1 puff BID -Currently has Advair HFA rescue inhaler on hand to use as needed but has not needed recently  Assessment/Plan:    Hypertension: -Currently moderately controlled  (improving) -Continue current regimen -Continue to monitor and record BP 30 minutes-1 hour after morning medications   Hyperlipidemia/ASCVD Risk Reduction: -Currently uncontrolled.  -I recommend follow-up lipid panel in another 10-12 weeks- advised patient to schedule PCP follow-up in October or November   Heart Failure: -Currently appropriately managed  -Continue current regimen -Continue to monitor BP and weight regularly   COPD: -Currently controlled  -Continue current regimen   Follow Up Plan: 1 month   Channing DELENA Mealing, PharmD, DPLA

## 2024-09-27 NOTE — Progress Notes (Unsigned)
   09/28/2024  Patient ID: Meghan Lynn, female   DOB: 01/15/1951, 73 y.o.   MRN: [id number]  Subjective/Objective Telephone visit to follow-up on management of chronic conditions  Hypertension: Current medications: carvedilol  6.25mg  BID, valsartan  320mg  daily, spironolactone  12.5mg  daily -Patient has increased carvedilol  to the dose increase of 6.25mg  BID and started taking spironolactone  and valsartan  in the morning versus bedtime -Patient has a validated, automated, upper arm home BP cuff and states BP this morning was 138/87, and she endorses values typically in the 130's/80's.  When we spoke 8 weeks ago, this was averaging 150/70  Hyperlipidemia/ASCVD Risk Reduction Current lipid lowering medications: Repatha  140mg  every 14 days -Medications tried in the past: statins cause severe muscle cramps that have required her calling EMS in the past -Patient started Repatha  approximately  month ago and is tolerating well so far with no noted adverse side effects Antiplatelet regimen: ASA 81mg  daily, clopidogrel  75mg  daily -Recently resumed clopidogrel  at 75mg  daily    Heart Failure (EF 40-50%): Current medications:  ACEi/ARB/ARNI: Valsartan  320mg   SGLT2i: Farxiga  10mg  daily  Beta blocker: carvedilol  6.25mg  BID  Mineralocorticoid Receptor Antagonist: sprinoloactone 12.5mg  daily Diuretic regimen: furosemide  40mg  PRN   -Patient has started taking Farxiga  in the morning versus bedtime to prevent/decrease having to get up during the night to urinate -Has not needed furosemide  recently; does not report any swelling of ankles/feet and states she is maintaining current weight of 127lbs.  Does not report any SOB or chest pain.   COPD: Current medications:  Wixela 100/20mcg  -No recent exacerbations noted -Patient has picked up Wixela and began using 1 puff BID -Currently has Advair HFA rescue inhaler on hand to use as needed but has not needed recently  Assessment/Plan:     Hypertension: -Currently moderately controlled (improving) -Continue current regimen -Continue to monitor and record BP 30 minutes-1 hour after morning medications -I recommend follow-up CMP; if K wnl, could consider increasing  spironolactone  to 25mg  daily to see if we can obtain BP <130/80   Hyperlipidemia/ASCVD Risk Reduction: -Currently uncontrolled.  -I recommend follow-up lipid panel in another 4-6 weeks- advised patient to schedule PCP follow-up in October or November   Heart Failure: -Currently appropriately managed  -Continue current regimen -Continue to monitor BP and weight regularly   COPD: -Currently controlled  -Continue current regimen   Follow Up Plan: 6 weeks   Meghan Lynn, PharmD, DPLA

## 2024-09-28 ENCOUNTER — Other Ambulatory Visit: Payer: Self-pay

## 2024-09-28 DIAGNOSIS — I10 Essential (primary) hypertension: Secondary | ICD-10-CM

## 2024-09-28 DIAGNOSIS — J449 Chronic obstructive pulmonary disease, unspecified: Secondary | ICD-10-CM

## 2024-09-28 DIAGNOSIS — Z789 Other specified health status: Secondary | ICD-10-CM

## 2024-10-08 ENCOUNTER — Ambulatory Visit: Payer: Self-pay

## 2024-10-08 ENCOUNTER — Ambulatory Visit

## 2024-10-08 ENCOUNTER — Encounter: Payer: Self-pay | Admitting: Internal Medicine

## 2024-10-08 ENCOUNTER — Ambulatory Visit (INDEPENDENT_AMBULATORY_CARE_PROVIDER_SITE_OTHER): Admitting: Internal Medicine

## 2024-10-08 VITALS — BP 132/76 | HR 91 | Temp 97.8°F | Ht 63.0 in | Wt 126.6 lb

## 2024-10-08 DIAGNOSIS — R1085 Abdominal pain of multiple sites: Secondary | ICD-10-CM

## 2024-10-08 DIAGNOSIS — R143 Flatulence: Secondary | ICD-10-CM | POA: Diagnosis not present

## 2024-10-08 DIAGNOSIS — R109 Unspecified abdominal pain: Secondary | ICD-10-CM | POA: Diagnosis not present

## 2024-10-08 NOTE — Progress Notes (Signed)
 [facility] PRIMARY CARE [facility] [address] [place] [address] Dept: [phone] Dept Fax: [phone]  Acute Care Office Visit  Subjective:   Meghan Lynn April 10, 1951 10/08/2024  Chief Complaint  Patient presents with   Abdominal Pain    Center of stomach around to right side started a week ago   Back Pain    Right shoulder blade down to kidney area pain for a week     HPI:  Discussed the use of AI scribe software for clinical note transcription with the patient, who gave verbal consent to proceed.  History of Present Illness   Meghan Lynn is a 73 year old female with esophageal narrowing and ulcers who presents with worsening abdominal pain.  She has been experiencing abdominal pain for about a week, described as cramping and similar to menstrual cramps. The pain is located in the upper middle abdomen and sometimes radiates to the right side and back. It worsens immediately after eating and taking her nighttime medication, regardless of the type of food, including soft foods like pudding.  She has a history of esophageal issues requiring annual dilation and has had ulcers in the past. During her last procedure, she was told that her esophagus was too narrow for dilation, and she felt 'backed up' as a result. No difficulty swallowing or chest pain.  She has experienced decreased bowel movements over the past week, with no bowel movement since last Wednesday, which is unusual for her as she typically has one every morning. No nausea, fever, diarrhea, or blood in stools. However, she reports occasional vomiting at night when taking a sip of water, which brings up undigested food. She has been using Pepto-Bismol for relief, which initially helped but is now less effective.  No changes in her ability to pass gas, although she notes a decrease in the amount of gas passed. She has lost a pound since Saturday and feels her abdomen is more  distended than usual.      The following portions of the patient's history were reviewed and updated as appropriate: past medical history, past surgical history, family history, social history, allergies, medications, and problem list.   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   NSTEMI (non-ST elevated myocardial infarction) (HCC) 02/06/2024   Erythrocytosis 02/06/2024   Asthma 09/16/2022   Symptomatic anemia 06/21/2020   Peptic ulcer disease    Hyperlipidemia    Dilatation of esophagus    Burning sensation of feet    Bronchitis due to tobacco use    Arthritis    Weakness of both lower extremities 06/22/2017   Shortness of breath 09/29/2016   GERD (gastroesophageal reflux disease) 09/30/2015   Coronary artery disease due to lipid rich plaque 08/29/2014   Essential hypertension 08/29/2014   Dyslipidemia 08/29/2014   Spondylolisthesis of l3-4 01/27/2013   Past Medical History:  Diagnosis Date   Active smoker    Anginal pain    Arthritis    Asthma    patient denies   Bronchitis due to tobacco use    Burning sensation of feet    Coronary artery disease    3 stents to RCA  Dilatation of esophagus    Gastritis    GERD (gastroesophageal reflux disease)    H/O pyloric stenosis 01/31/2020   Headache(784.0)    cluster every day   Headache, migraine    daily   Hyperlipidemia    Hypertension    Lower GI bleed 06/20/2020   Peptic ulcer disease    Pneumonia    Shortness of breath dyspnea    with exertion   Past Surgical History:  Procedure Laterality Date   BACK SURGERY     BALLOON DILATION N/A 05/30/2013   Procedure: BALLOON DILATION;  Surgeon: Norleen JAYSON Hint, MD;  Location: [facility] ENDOSCOPY;  Service: Endoscopy;  Laterality: N/A;   BALLOON DILATION N/A 08/02/2013   Procedure: BALLOON DILATION;  Surgeon: Norleen JAYSON Hint, MD;  Location: [facility] ENDOSCOPY;  Service: Endoscopy;  Laterality: N/A;   BALLOON DILATION N/A 04/29/2016   Procedure: BALLOON DILATION;  Surgeon: Norleen Hint, MD;  Location: [facility] ENDOSCOPY;  Service: Endoscopy;  Laterality: N/A;   BALLOON DILATION N/A 06/16/2020   Procedure: BALLOON DILATION;  Surgeon: Saintclair Jasper, MD;  Location: [facility] ENDOSCOPY;  Service: Gastroenterology;  Laterality: N/A;   BIOPSY  06/16/2020   Procedure: BIOPSY;  Surgeon: Saintclair Jasper, MD;  Location: [facility] ENDOSCOPY;  Service: Gastroenterology;;   CARDIAC CATHETERIZATION  03/07/2002   moderte CAD 80% (mid RCA) - Dr. Elsie Ona   CARDIAC CATHETERIZATION  09/13/2003   Cypher 2.5x39mm and 2.5x18 and Taxus 2.75x24mm to distal, mid, prox RCA (Dr. FABIENE Hasten)   CARDIOLITE MYOCARDIAL PERFUSION STUDY  05/2006   positive bruce protocol, low risk, EF 80%   CARDIOVASCULAR STRESS TEST  2010   COLONOSCOPY     COLONOSCOPY WITH PROPOFOL  N/A 06/16/2020   Procedure: COLONOSCOPY WITH PROPOFOL ;  Surgeon: Saintclair Jasper, MD;  Location: [facility] ENDOSCOPY;  Service: Gastroenterology;  Laterality: N/A;   COLONOSCOPY WITH PROPOFOL  N/A 06/21/2020   Procedure: COLONOSCOPY WITH PROPOFOL ;  Surgeon: Elicia Claw, MD;  Location: [facility] ENDOSCOPY;  Service: Gastroenterology;  Laterality: N/A;   CORONARY ANGIOPLASTY  2004   stents x 3   CORONARY ARTERY BYPASS GRAFT N/A 02/10/2024   Procedure: CORONARY ARTERY BYPASS GRAFTING (CABG) TIMES THREE USING LEFT INTERNAL MAMMARY ARTERY AND ENDOSCOPICALLY HARVESTED RIGHT GREATER SAPHENOUS VEIN.;  Surgeon: Kerrin Elspeth JAYSON, MD;  Location: [facility] OR;  Service: Open Heart Surgery;  Laterality: N/A;   DILATATION & CURETTAGE/HYSTEROSCOPY WITH MYOSURE N/A 08/19/2016   Procedure: DILATATION & CURETTAGE/HYSTEROSCOPY WITH MYOSURE;  Surgeon: Dickie Carder, MD;  Location: [facility] ORS;   Service: Gynecology;  Laterality: N/A;   ESOPHAGOGASTRODUODENOSCOPY N/A 05/30/2013   Procedure: ESOPHAGOGASTRODUODENOSCOPY (EGD);  Surgeon: Norleen JAYSON Hint, MD;  Location: [facility] ENDOSCOPY;  Service: Endoscopy;  Laterality: N/A;   ESOPHAGOGASTRODUODENOSCOPY N/A 08/02/2013   Procedure: ESOPHAGOGASTRODUODENOSCOPY (EGD);  Surgeon: Norleen JAYSON Hint, MD;  Location: [facility] ENDOSCOPY;  Service: Endoscopy;  Laterality: N/A;  barb /ja   ESOPHAGOGASTRODUODENOSCOPY (EGD) WITH PROPOFOL  N/A 04/29/2016   Procedure: ESOPHAGOGASTRODUODENOSCOPY (EGD) WITH PROPOFOL ;  Surgeon: Norleen Hint, MD;  Location: [facility] ENDOSCOPY;  Service: Endoscopy;  Laterality: N/A;   ESOPHAGOGASTRODUODENOSCOPY (EGD) WITH PROPOFOL  N/A 06/16/2020   Procedure: ESOPHAGOGASTRODUODENOSCOPY (EGD) WITH PROPOFOL ;  Surgeon: Saintclair Jasper, MD;  Location: [facility] ENDOSCOPY;  Service: Gastroenterology;  Laterality: N/A;   FRACTURE SURGERY     HEMOSTASIS CLIP PLACEMENT  06/21/2020   Procedure: HEMOSTASIS CLIP PLACEMENT;  Surgeon: Elicia Claw, MD;  Location: [facility] ENDOSCOPY;  Service: Gastroenterology;;   IABP INSERTION Right 02/08/2024   Procedure: IABP Insertion;  Surgeon: Wendel Lurena POUR, MD;  Location: [facility] INVASIVE  CV LAB;  Service: Cardiovascular;  Laterality: Right;   LEFT HEART CATH AND CORONARY ANGIOGRAPHY N/A 02/08/2024   Procedure: LEFT HEART CATH AND CORONARY ANGIOGRAPHY;  Surgeon: Wendel Lurena POUR, MD;  Location: [facility] INVASIVE CV LAB;  Service: Cardiovascular;  Laterality: N/A;   ORIF ELBOW FRACTURE  2006   POLYPECTOMY  06/16/2020   Procedure: POLYPECTOMY;  Surgeon: Saintclair Jasper, MD;  Location: [facility] ENDOSCOPY;  Service: Gastroenterology;;   POLYPECTOMY  06/21/2020   Procedure: POLYPECTOMY;  Surgeon: Elicia Claw, MD;  Location: [facility] ENDOSCOPY;  Service: Gastroenterology;;   TEE WITHOUT CARDIOVERSION N/A 02/10/2024   Procedure: TRANSESOPHAGEAL ECHOCARDIOGRAM (TEE);  Surgeon: Kerrin Elspeth BROCKS, MD;  Location: [facility] OR;  Service: Open Heart Surgery;  Laterality: N/A;    TRANSTHORACIC ECHOCARDIOGRAM  07/2008   normal LV function, mild MR, mild TR, trace pulm valve regurg   Family History  Problem Relation Age of Onset   Diabetes Mother    CAD Mother        CABG   Bone cancer Father    Hypertension Father    Heart disease Brother    Kidney disease Sister    Cancer Sister    Diabetes Sister    Hyperlipidemia Sister    Hypertension Sister     Current Outpatient Medications:    albuterol  (VENTOLIN  HFA) 108 (90 Base) MCG/ACT inhaler, INHALE 2 PUFFS EVERY 4-6 HOURS AS NEEDED FOR SHORTNESS OF BREATH OR WHEEZING, Disp: 18 each, Rfl: 1   Biotin 1 MG CAPS, Take 1 mg by mouth daily. , Disp: , Rfl:    carvedilol  (COREG ) 6.25 MG tablet, Take 1 tablet (6.25 mg total) by mouth 2 (two) times daily with a meal., Disp: 180 tablet, Rfl: 0   clopidogrel  (PLAVIX ) 75 MG tablet, Take 1 tablet (75 mg total) by mouth daily., Disp: 90 tablet, Rfl: 1   dapagliflozin  propanediol (FARXIGA ) 10 MG TABS tablet, Take 1 tablet (10 mg total) by mouth daily., Disp: 90 tablet, Rfl: 1   Evolocumab  (REPATHA  SURECLICK) 140 MG/ML SOAJ, Inject 140 mg into the skin every 14 (fourteen) days., Disp: 6 mL, Rfl: 3   fluticasone -salmeterol (WIXELA INHUB ) 100-50 MCG/ACT AEPB, Inhale 1 puff into the lungs 2 (two) times daily., Disp: 60 each, Rfl: 3   furosemide  (LASIX ) 40 MG tablet, Take 1 tablet (40 mg total) by mouth daily. X 7 days, then transition to daily as needed for weight gain of 3-5 lbs in 24-48 hours (Patient taking differently: Take 40 mg by mouth daily as needed for edema. X 7 days, then transition to daily as needed for weight gain of 3-5 lbs in 24-48 hours), Disp: 30 tablet, Rfl: 1   Multiple Vitamin (MULTIVITAMIN WITH MINERALS) TABS, Take 1 tablet by mouth daily., Disp: , Rfl:    nitroGLYCERIN  (NITROSTAT ) 0.4 MG SL tablet, Place 1 tablet (0.4 mg total) under the tongue every 5 (five) minutes as needed for chest pain., Disp: 25 tablet, Rfl: 3   omeprazole (PRILOSEC) 20 MG capsule, Take 20  mg by mouth daily., Disp: , Rfl:    spironolactone  (ALDACTONE ) 25 MG tablet, Take 0.5 tablets (12.5 mg total) by mouth daily., Disp: 90 tablet, Rfl: 1   tiZANidine (ZANAFLEX) 2 MG tablet, Take 1 tablet (2 mg total) by mouth every 6 (six) hours as needed for muscle spasms., Disp: 90 tablet, Rfl: 1   valsartan  (DIOVAN ) 320 MG tablet, Take 1 tablet (320 mg total) by mouth daily., Disp: 90 tablet, Rfl: 3   aspirin  EC 81 MG tablet,  Take 1 tablet (81 mg total) by mouth daily. Swallow whole., Disp: , Rfl:    potassium chloride  SA (KLOR-CON  M) 20 MEQ tablet, Take 1 tablet (20 mEq total) by mouth daily. X 7 days, then use only on days you take Lasix  (Patient not taking: Reported on 10/08/2024), Disp: 30 tablet, Rfl: 3 Allergies  Allergen Reactions   Rosuvastatin      Muscle aches   Atorvastatin  Other (See Comments)    Muscle cramping   Latex Itching   Propoxyphene Nausea And Vomiting    Darvocet     ROS: A complete ROS was performed with pertinent positives/negatives noted in the HPI. The remainder of the ROS are negative.    Objective:   Today's Vitals   10/08/24 1548  BP: 132/76  Pulse: 91  Temp: 97.8 F (36.6 C)  TempSrc: Temporal  SpO2: 96%  Weight: 126 lb 9.6 oz (57.4 kg)  Height: 5' 3 (1.6 m)    GENERAL: Well-appearing, in NAD. Well nourished.  SKIN: Pink, warm and dry. No rash, lesion, ulceration, or ecchymoses.  NECK: Trachea midline. Full ROM w/o pain or tenderness. No lymphadenopathy.  RESPIRATORY: Chest wall symmetrical. Respirations even and non-labored. Breath sounds clear to auscultation bilaterally.  CARDIAC: S1, S2 present, regular rate and rhythm. Peripheral pulses 2+ bilaterally.  GI: Abdomen soft, mild distention ,TTP to epigastric, RUQ, LUQ region. Normoactive bowel sounds. No rebound tenderness. No hepatomegaly or splenomegaly. No CVA tenderness.  EXTREMITIES: Without clubbing, cyanosis, or edema.  NEUROLOGIC:  Steady, even gait.  PSYCH/MENTAL STATUS: Alert,  oriented x 3. Cooperative, appropriate mood and affect.    No results found for any visits on 10/08/24.    Assessment & Plan:  1. Abdominal pain of multiple sites (Primary) - CBC with Differential/Platelet - Comp Met (CMET) - Lipase - H. pylori breath test - DG Abd 1 View; Future  - Awaiting lab and XR results to determine further plan of treatment  No orders of the defined types were placed in this encounter.  Orders Placed This Encounter  Procedures   DG Abd 1 View    Standing Status:   Future    Number of Occurrences:   1    Expiration Date:   04/08/2025    Reason for Exam (SYMPTOM  OR DIAGNOSIS REQUIRED):   abdominal pain, no BM x 5 days. decrease flatulence.    Preferred imaging location?:   Internal   CBC with Differential/Platelet   Comp Met (CMET)   Lipase   H. pylori breath test   Lab Orders         CBC with Differential/Platelet         Comp Met (CMET)         Lipase         H. pylori breath test     No images are attached to the encounter or orders placed in the encounter.  Return if symptoms worsen or fail to improve.   Rosina Senters, FNP

## 2024-10-08 NOTE — Telephone Encounter (Signed)
 FYI Only or Action Required?: FYI only for provider.  Patient was last seen in primary care on 08/22/2024 by Sebastian Beverley NOVAK, MD.  Called Nurse Triage reporting Abdominal Pain.  Symptoms began a week ago.  Interventions attempted: OTC medications: peto bismuth.  Symptoms are: gradually worsening.  Triage Disposition: See Physician Within 24 Hours  Patient/caregiver understands and will follow disposition?: Yes    Copied from CRM [id number]. Topic: Clinical - Red Word Triage >> Oct 08, 2024  8:40 AM Carlyon D wrote: Red Word that prompted transfer to Nurse Triage: Pain in the stomach and back pt thinks she has an ulcer Reason for Disposition  [1] MODERATE pain (e.g., interferes with normal activities) AND [2] pain comes and goes (cramps) AND [3] present > 24 hours  (Exception: Pain with Vomiting or Diarrhea - see that Guideline.)  [1] Numbness in an arm or hand (i.e., loss of sensation) AND [2] upper back pain  Answer Assessment - Initial Assessment Questions 1. LOCATION: Where does it hurt?      Stomach Middle and right side  2. RADIATION: Does the pain shoot anywhere else? (e.g., chest, back)     na 3. ONSET: When did the pain begin? (e.g., minutes, hours or days ago)      X week 4. SUDDEN: Gradual or sudden onset?     At first pain goes and comes and now Constant and anything eat/take medication the pain is worse 5. PATTERN Does the pain come and go, or is it constant?     constant 6. SEVERITY: How bad is the pain?  (e.g., Scale 1-10; mild, moderate, or severe)     10/10 7. RECURRENT SYMPTOM: Have you ever had this type of stomach pain before? If Yes, ask: When was the last time? and What happened that time?      Yes - ulcers 8. CAUSE: What do you think is causing the stomach pain? (e.g., gallstones, recent abdominal surgery)     Possible ulcer 9. RELIEVING/AGGRAVATING FACTORS: What makes it better or worse? (e.g., antacids, bending or twisting motion,  bowel movement)     Peto bismuth - helped at first but not now 10. OTHER SYMPTOMS: Do you have any other symptoms? (e.g., back pain, diarrhea, fever, urination pain, vomiting)      Back pain 11. PREGNANCY: Is there any chance you are pregnant? When was your last menstrual period?       na  Pt states her medication is not going down very well: pt states in the past she had been dilated: pt states ulcers in the past.  Pt is using pudding to help with medication going down.  Answer Assessment - Initial Assessment Questions 1. ONSET: When did the pain begin? (e.g., minutes, hours, days)     Ongoing for about 8 months and worsening 2. LOCATION: Where does it hurt? (upper, mid or lower back)     Shoulder blades abd goes down side 3. SEVERITY: How bad is the pain?  (e.g., Scale 1-10; mild, moderate, or severe)     8/10 4. PATTERN: Is the pain constant? (e.g., yes, no; constant, intermittent)      Comes and goes 5. RADIATION: Does the pain shoot into your legs or somewhere else?     no 6. CAUSE:  What do you think is causing the back pain?      unknown 7. BACK OVERUSE:  Any recent lifting of heavy objects, strenuous work or exercise?     no 8. MEDICINES:  What have you taken so far for the pain? (e.g., nothing, acetaminophen , NSAIDS)     na 9. NEUROLOGIC SYMPTOMS: Do you have any weakness, numbness, or problems with bowel/bladder control? na 10. OTHER SYMPTOMS: Do you have any other symptoms? (e.g., fever, abdomen pain, burning with urination, blood in urine)   Right leg has pain and numbness & some in the left 11. PREGNANCY: Is there any chance you are pregnant? When was your last menstrual period?       na  Protocols used: Abdominal Pain - Female-A-AH, Back Pain-A-AH

## 2024-10-09 ENCOUNTER — Ambulatory Visit: Payer: Self-pay | Admitting: Internal Medicine

## 2024-10-09 LAB — CBC WITH DIFFERENTIAL/PLATELET
Basophils Absolute: 0.1 K/uL (ref 0.0–0.1)
Basophils Relative: 0.9 % (ref 0.0–3.0)
Eosinophils Absolute: 0 K/uL (ref 0.0–0.7)
Eosinophils Relative: 0.2 % (ref 0.0–5.0)
HCT: 55.8 % — ABNORMAL HIGH (ref 36.0–46.0)
Hemoglobin: 17.7 g/dL — ABNORMAL HIGH (ref 12.0–15.0)
Lymphocytes Relative: 21.1 % (ref 12.0–46.0)
Lymphs Abs: 2 K/uL (ref 0.7–4.0)
MCHC: 31.8 g/dL (ref 30.0–36.0)
MCV: 79.6 fl (ref 78.0–100.0)
Monocytes Absolute: 0.6 K/uL (ref 0.1–1.0)
Monocytes Relative: 6.5 % (ref 3.0–12.0)
Neutro Abs: 6.7 K/uL (ref 1.4–7.7)
Neutrophils Relative %: 71.3 % (ref 43.0–77.0)
Platelets: 243 K/uL (ref 150.0–400.0)
RBC: 7.01 Mil/uL — ABNORMAL HIGH (ref 3.87–5.11)
RDW: 14.5 % (ref 11.5–15.5)
WBC: 9.4 K/uL (ref 4.0–10.5)

## 2024-10-09 LAB — COMPREHENSIVE METABOLIC PANEL WITH GFR
ALT: 11 U/L (ref 0–35)
AST: 15 U/L (ref 0–37)
Albumin: 4.1 g/dL (ref 3.5–5.2)
Alkaline Phosphatase: 82 U/L (ref 39–117)
BUN: 13 mg/dL (ref 6–23)
CO2: 25 meq/L (ref 19–32)
Calcium: 9.8 mg/dL (ref 8.4–10.5)
Chloride: 103 meq/L (ref 96–112)
Creatinine, Ser: 0.99 mg/dL (ref 0.40–1.20)
GFR: 56.24 mL/min — ABNORMAL LOW (ref >60.00)
Glucose, Bld: 124 mg/dL — ABNORMAL HIGH (ref 70–99)
Potassium: 3.7 meq/L (ref 3.5–5.1)
Sodium: 139 meq/L (ref 135–145)
Total Bilirubin: 0.2 mg/dL (ref 0.2–1.2)
Total Protein: 7.9 g/dL (ref 6.0–8.3)

## 2024-10-09 LAB — LIPASE: Lipase: 36 U/L (ref 11.0–59.0)

## 2024-10-09 LAB — H. PYLORI BREATH TEST: H. pylori Breath Test: NOT DETECTED

## 2024-10-09 NOTE — Telephone Encounter (Signed)
Patient has been notified directly; all questions, if any, were answered. Patient voiced understanding.   

## 2024-10-10 NOTE — Telephone Encounter (Signed)
 Copied from CRM [id number]. Topic: Clinical - Lab/Test Results >> Oct 09, 2024  3:11 PM Meghan Lynn wrote: Reason for CRM: Patient called in regarding lab and imaging results, would like for someone to give her a callback .  Patient called in stated she has took laxative and edema and isnt really getting any relief with that so would like a callback  Patient already spoke to Agnella after this message was rcvd

## 2024-10-11 ENCOUNTER — Other Ambulatory Visit: Payer: Self-pay | Admitting: Internal Medicine

## 2024-10-11 DIAGNOSIS — D751 Secondary polycythemia: Secondary | ICD-10-CM

## 2024-10-11 NOTE — Telephone Encounter (Signed)
 Patient has been notified directly; all questions, if any, were answered. Patient voiced understanding.  Lab result

## 2024-10-12 ENCOUNTER — Ambulatory Visit
Admission: RE | Admit: 2024-10-12 | Discharge: 2024-10-12 | Disposition: A | Discharge status: Home / Self Care | Attending: Internal Medicine | Admitting: Internal Medicine

## 2024-10-12 ENCOUNTER — Ambulatory Visit: Payer: Self-pay

## 2024-10-12 ENCOUNTER — Emergency Department (HOSPITAL_BASED_OUTPATIENT_CLINIC_OR_DEPARTMENT_OTHER)

## 2024-10-12 ENCOUNTER — Emergency Department (HOSPITAL_BASED_OUTPATIENT_CLINIC_OR_DEPARTMENT_OTHER)
Admission: EM | Admit: 2024-10-12 | Discharge: 2024-10-12 | Disposition: A | Discharge status: Home / Self Care | Attending: Emergency Medicine | Admitting: Emergency Medicine

## 2024-10-12 ENCOUNTER — Encounter (HOSPITAL_BASED_OUTPATIENT_CLINIC_OR_DEPARTMENT_OTHER): Payer: Self-pay

## 2024-10-12 ENCOUNTER — Other Ambulatory Visit: Payer: Self-pay

## 2024-10-12 VITALS — BP 163/97 | HR 80 | Temp 98.1°F | Resp 18 | Ht 61.0 in | Wt 125.0 lb

## 2024-10-12 DIAGNOSIS — Z7982 Long term (current) use of aspirin: Secondary | ICD-10-CM | POA: Insufficient documentation

## 2024-10-12 DIAGNOSIS — R140 Abdominal distension (gaseous): Secondary | ICD-10-CM

## 2024-10-12 DIAGNOSIS — Z91040 Latex allergy status: Secondary | ICD-10-CM | POA: Insufficient documentation

## 2024-10-12 DIAGNOSIS — K5900 Constipation, unspecified: Secondary | ICD-10-CM

## 2024-10-12 DIAGNOSIS — R110 Nausea: Secondary | ICD-10-CM | POA: Diagnosis not present

## 2024-10-12 DIAGNOSIS — R1084 Generalized abdominal pain: Secondary | ICD-10-CM

## 2024-10-12 DIAGNOSIS — Z7902 Long term (current) use of antithrombotics/antiplatelets: Secondary | ICD-10-CM | POA: Diagnosis not present

## 2024-10-12 LAB — URINALYSIS, ROUTINE W REFLEX MICROSCOPIC
Bilirubin Urine: NEGATIVE
Glucose, UA: >=500 mg/dL — AB
Ketones, ur: NEGATIVE mg/dL
Leukocytes,Ua: NEGATIVE
Nitrite: NEGATIVE
Protein, ur: 100 mg/dL — AB
Specific Gravity, Urine: 1.02 (ref 1.005–1.030)
pH: 6 (ref 5.0–8.0)

## 2024-10-12 LAB — COMPREHENSIVE METABOLIC PANEL WITH GFR
ALT: 6 U/L (ref 0–44)
AST: 19 U/L (ref 15–41)
Albumin: 4 g/dL (ref 3.5–5.0)
Alkaline Phosphatase: 91 U/L (ref 38–126)
Anion gap: 13 (ref 5–15)
BUN: 12 mg/dL (ref 8–23)
CO2: 22 mmol/L (ref 22–32)
Calcium: 9.5 mg/dL (ref 8.9–10.3)
Chloride: 102 mmol/L (ref 98–111)
Creatinine, Ser: 0.94 mg/dL (ref 0.44–1.00)
GFR, Estimated: >60 mL/min (ref >60)
Glucose, Bld: 114 mg/dL — ABNORMAL HIGH (ref 70–99)
Potassium: 3.7 mmol/L (ref 3.5–5.1)
Sodium: 137 mmol/L (ref 135–145)
Total Bilirubin: 0.4 mg/dL (ref 0.0–1.2)
Total Protein: 7.5 g/dL (ref 6.5–8.1)

## 2024-10-12 LAB — URINALYSIS, MICROSCOPIC (REFLEX): WBC, UA: NONE SEEN WBC/hpf (ref 0–5)

## 2024-10-12 LAB — CBC
HCT: 55.5 % — ABNORMAL HIGH (ref 36.0–46.0)
Hemoglobin: 17.9 g/dL — ABNORMAL HIGH (ref 12.0–15.0)
MCH: 25 pg — ABNORMAL LOW (ref 26.0–34.0)
MCHC: 32.3 g/dL (ref 30.0–36.0)
MCV: 77.5 fL — ABNORMAL LOW (ref 80.0–100.0)
Platelets: 250 K/uL (ref 150–400)
RBC: 7.16 MIL/uL — ABNORMAL HIGH (ref 3.87–5.11)
RDW: 14.7 % (ref 11.5–15.5)
WBC: 10.1 K/uL (ref 4.0–10.5)
nRBC: 0 % (ref 0.0–0.2)

## 2024-10-12 LAB — LIPASE, BLOOD: Lipase: 39 U/L (ref 11–51)

## 2024-10-12 MED ORDER — SODIUM CHLORIDE 0.9 % IV BOLUS
1000.0000 mL | Freq: Once | INTRAVENOUS | Status: AC
  Administered 2024-10-12: 1000 mL via INTRAVENOUS

## 2024-10-12 MED ORDER — GLYCERIN (ADULT) 2 G RE SUPP: 1.0000 | RECTAL | 0 refills | Status: AC | PRN

## 2024-10-12 MED ORDER — ONDANSETRON 4 MG PO TBDP
4.0000 mg | ORAL_TABLET | Freq: Once | ORAL | Status: AC
  Administered 2024-10-12: 4 mg via ORAL
  Filled 2024-10-12: qty 1

## 2024-10-12 MED ORDER — IOHEXOL 300 MG/ML  SOLN
80.0000 mL | Freq: Once | INTRAMUSCULAR | Status: AC | PRN
  Administered 2024-10-12: 80 mL via INTRAVENOUS

## 2024-10-12 NOTE — ED Notes (Signed)
 Pt reports successful bowel movement sp soap suds enema. Pt reports relief of abdominal pain and nausea. Provider notified.

## 2024-10-12 NOTE — Discharge Instructions (Addendum)
 Lab work was reassuring today.  CT scan showed evidence of constipation.  Please use MiraLAX  and Fleet enemas as needed.  I have prescribed suppository for constipation, use as needed as well.  Please monitor for any worsening symptoms including distention of abdomen, blood in your stool, or chest pain or shortness of breath.  If any of these or other concerning symptoms arise please return to ED for further evaluation. I have provided a referral to GI, if you continue to have chronic constipation it is recommended to call to make an appointment with them for further evaluation and management.

## 2024-10-12 NOTE — Discharge Instructions (Signed)
 I am concerned over your severe abdominal pain, abdominal distention, and lack of bowel movement over the last 10 days despite enema and laxatives.  Please head to the emergency department for further advanced evaluation.

## 2024-10-12 NOTE — ED Triage Notes (Signed)
 Sent by UC. Generalized abd pain, constipation, nausea. Last BM approx 10 days ago.   Denies emesis, urinary symptoms

## 2024-10-12 NOTE — Telephone Encounter (Signed)
 FYI Only or Action Required?: FYI only for provider.  Patient was last seen in primary care on 10/08/2024 by Meghan Knee, FNP.  Called Nurse Triage reporting Constipation.  Symptoms began several weeks ago.  Interventions attempted: OTC medications: mirilax and Rest, hydration, or home remedies.  Symptoms are: gradually worsening.  Triage Disposition: See HCP Within 4 Hours (Or PCP Triage)  Patient/caregiver understands and will follow disposition?: Yes    Copied from CRM [id number]. Topic: Clinical - Red Word Triage >> Oct 12, 2024 11:38 AM Franky GRADE wrote: Red Word that prompted transfer to Nurse Triage: Patient was seen in the office on 10/08/2024 due to not being able to pass a bowel movement, she had x-rays and recommended to start miralax , patient has been taking it as advised but has yet to pass a bowel movement and is feeling severe abdomen pain as a result. Reason for Disposition  [1] Constant abdominal pain AND [2] present > 2 hours  Answer Assessment - Initial Assessment Questions Additional info: 1) Patient called in to see what other medication she can try to help with constipation, she is requesting the sweet liquid she received while inpatient. Patient has not had a BM for 8 days and having increased abdominal pain, minimal gas is passed. 2) SDV not available, patient will proceed to urgent care at 2pm.    1. STOOL PATTERN OR FREQUENCY: How often do you have a bowel movement (BM)?  (Normal range: 3 times a day to every 3 days)  When was your last BM?       Last bm 10/03/24 2. STRAINING: Do you have to strain to have a BM?      yes 3. ONSET: When did the constipation begin?     10/03/24 4. RECTAL PAIN: Does your rectum hurt when the stool comes out? If Yes, ask: Do you have hemorrhoids? How bad is the pain?  (Scale 1-10; or mild, moderate, severe)     Denies  5. BM COMPOSITION: Are the stools hard?      Not passing bm 6. BLOOD ON STOOLS: Has there  been any blood on the toilet tissue or on the surface of the BM? If Yes, ask: When was the last time?     Not passing stool  7. CHRONIC CONSTIPATION: Is this a new problem for you?  If No, ask: How long have you had this problem? (days, weeks, months)      One week 8. CHANGES IN DIET OR HYDRATION: Have there been any recent changes in your diet? How much fluids are you drinking on a daily basis?  How much have you had to drink today?     Eating less 9. MEDICINES: Have you been taking any new medicines? Are you taking any narcotic pain medicines? (e.g., Dilaudid , morphine , Percocet, Vicodin)      10. LAXATIVES: Have you been using any stool softeners, laxatives, or enemas?  If Yes, ask What are you using, how often, and when was the last time?       Miralax  bid X 2 days, now daily.  11. ACTIVITY:  How much walking do you do every day?  Has your activity level decreased in the past week?         12. CAUSE: What do you think is causing the constipation?        unsure 13. MEDICAL HISTORY: Do you have a history of hemorrhoids, rectal fissures, rectal surgery, or rectal abscess?  14. OTHER SYMPTOMS: Do you have any other symptoms? (e.g., abdomen pain, bloating, fever, vomiting)      Abdominal pain 15/10  Protocols used: Constipation-A-AH

## 2024-10-12 NOTE — ED Provider Notes (Signed)
 [facility] Provider Note   CSN: [id number] Arrival date & time: 10/12/24  1630     Patient presents with: Abdominal Pain   Meghan Lynn is a 73 y.o. female.  72 year old female presents to the ED with complaints of 10 days of constipation and abdominal pain.  Patient endorses some associated nausea.  Patient reports Meghan Lynn was seen at urgent care today and advised to come to ED.  Patient also endorses generalized abdominal pain.  Patient reports prior to the constipation 10 days ago Meghan Lynn had normal bowel movements.  No history of hemorrhoid, GI bleeding, blood thinners.      Prior to Admission medications   Medication Sig Start Date End Date Taking? Authorizing Provider  glycerin adult 2 g suppository Place 1 suppository rectally as needed for constipation. 10/12/24  Yes Myriam Fonda RAMAN, PA-C  albuterol  (VENTOLIN  HFA) 108 (90 Base) MCG/ACT inhaler INHALE 2 PUFFS EVERY 4-6 HOURS AS NEEDED FOR SHORTNESS OF BREATH OR WHEEZING 08/24/24   Billy Knee, FNP  aspirin  EC 81 MG tablet Take 1 tablet (81 mg total) by mouth daily. Swallow whole. 02/21/24   Barrett, Rocky SAUNDERS, PA-C  Biotin 1 MG CAPS Take 1 mg by mouth daily.     [provider]  carvedilol  (COREG ) 6.25 MG tablet Take 1 tablet (6.25 mg total) by mouth 2 (two) times daily with a meal. 07/18/24   Billy Knee, FNP  clopidogrel  (PLAVIX ) 75 MG tablet Take 1 tablet (75 mg total) by mouth daily. 07/10/24   Billy Knee, FNP  dapagliflozin  propanediol (FARXIGA ) 10 MG TABS tablet Take 1 tablet (10 mg total) by mouth daily. 07/10/24   Billy Knee, FNP  Evolocumab  (REPATHA  SURECLICK) 140 MG/ML SOAJ Inject 140 mg into the skin every 14 (fourteen) days. 07/19/24   Hilty, Vinie BROCKS, MD  fluticasone -salmeterol (WIXELA INHUB ) 100-50 MCG/ACT AEPB Inhale 1 puff into the lungs 2 (two) times daily. 07/10/24   Billy Knee, FNP  furosemide  (LASIX ) 40 MG tablet Take 1 tablet (40 mg total) by mouth daily.  X 7 days, then transition to daily as needed for weight gain of 3-5 lbs in 24-48 hours Patient taking differently: Take 40 mg by mouth daily as needed for edema. X 7 days, then transition to daily as needed for weight gain of 3-5 lbs in 24-48 hours 02/21/24   Barrett, Erin R, PA-C  Multiple Vitamin (MULTIVITAMIN WITH MINERALS) TABS Take 1 tablet by mouth daily.    [provider]  nitroGLYCERIN  (NITROSTAT ) 0.4 MG SL tablet Place 1 tablet (0.4 mg total) under the tongue every 5 (five) minutes as needed for chest pain. 04/26/23 10/08/24  Loistine Sober, NP  omeprazole (PRILOSEC) 20 MG capsule Take 20 mg by mouth daily.    [provider]  potassium chloride  SA (KLOR-CON  M) 20 MEQ tablet Take 1 tablet (20 mEq total) by mouth daily. X 7 days, then use only on days you take Lasix  Patient not taking: Reported on 10/08/2024 02/21/24   Barrett, Rocky SAUNDERS, PA-C  spironolactone  (ALDACTONE ) 25 MG tablet Take 0.5 tablets (12.5 mg total) by mouth daily. 07/10/24   Billy Knee, FNP  tiZANidine (ZANAFLEX) 2 MG tablet Take 1 tablet (2 mg total) by mouth every 6 (six) hours as needed for muscle spasms. 03/07/24   Barrett, Rocky SAUNDERS, PA-C  valsartan  (DIOVAN ) 320 MG tablet Take 1 tablet (320 mg total) by mouth daily. 07/17/24   Mona Vinie BROCKS, MD    Allergies: Rosuvastatin ,  Atorvastatin , Latex, and Propoxyphene    Review of Systems  Gastrointestinal:  Positive for abdominal pain, constipation and nausea.  All other systems reviewed and are negative.   Updated Vital Signs BP (!) 167/99 (BP Location: Right Arm)   Pulse 79   Temp 97.8 F (36.6 C) (Oral)   Resp 16   Ht 5' 1 (1.549 m)   Wt 56.7 kg   SpO2 100%   BMI 23.62 kg/m   Physical Exam Vitals and nursing note reviewed.  Constitutional:      Appearance: Normal appearance. Meghan Lynn is well-developed.  HENT:     Head: Normocephalic and atraumatic.     Nose: Nose normal.  Eyes:     Extraocular Movements: Extraocular movements intact.      Conjunctiva/sclera: Conjunctivae normal.     Pupils: Pupils are equal, round, and reactive to light.  Cardiovascular:     Rate and Rhythm: Normal rate and regular rhythm.  Pulmonary:     Effort: Pulmonary effort is normal. No respiratory distress.     Breath sounds: Normal breath sounds.  Abdominal:     General: Bowel sounds are normal. There is no distension.     Palpations: Abdomen is soft. There is no fluid wave.     Tenderness: There is generalized abdominal tenderness.  Musculoskeletal:        General: Normal range of motion.     Cervical back: Normal range of motion.  Skin:    General: Skin is warm.     Capillary Refill: Capillary refill takes less than 2 seconds.     Findings: No rash.  Neurological:     General: No focal deficit present.     Mental Status: Meghan Lynn is alert.  Psychiatric:        Mood and Affect: Mood normal.        Behavior: Behavior normal.     (all labs ordered are listed, but only abnormal results are displayed) Labs Reviewed  COMPREHENSIVE METABOLIC PANEL WITH GFR - Abnormal; Notable for the following components:      Result Value   Glucose, Bld 114 (*)    All other components within normal limits  CBC - Abnormal; Notable for the following components:   RBC 7.16 (*)    Hemoglobin 17.9 (*)    HCT 55.5 (*)    MCV 77.5 (*)    MCH 25.0 (*)    All other components within normal limits  URINALYSIS, ROUTINE W REFLEX MICROSCOPIC - Abnormal; Notable for the following components:   Glucose, UA >=500 (*)    Hgb urine dipstick TRACE (*)    Protein, ur 100 (*)    All other components within normal limits  URINALYSIS, MICROSCOPIC (REFLEX) - Abnormal; Notable for the following components:   Bacteria, UA RARE (*)    All other components within normal limits  LIPASE, BLOOD    EKG: EKG Interpretation Date/Time:  Friday October 12 2024 18:29:50 EDT Ventricular Rate:  75 PR Interval:  149 QRS Duration:  72 QT Interval:  358 QTC Calculation: 400 R  Axis:   41  Text Interpretation: Sinus rhythm Multiple ventricular premature complexes no sig change from previous Confirmed by Armenta Canning [phone]) on 10/12/2024 6:54:19 PM  Radiology: CT ABDOMEN PELVIS W CONTRAST Result Date: 10/12/2024 EXAM: CT ABDOMEN AND PELVIS WITH CONTRAST 10/12/2024 06:21:00 PM TECHNIQUE: CT of the abdomen and pelvis was performed with the administration of 80 mL of iohexol  (OMNIPAQUE ) 300 MG/ML solution. Multiplanar reformatted images are provided for  review. Automated exposure control, iterative reconstruction, and/or weight-based adjustment of the mA/kV was utilized to reduce the radiation dose to as low as reasonably achievable. COMPARISON: CT abdomen and pelvis 02/06/2024. X-ray abdomen 10/08/2024. CLINICAL HISTORY: Abdominal pain, acute, nonlocalized. Generalized abd pain, constipation, nausea. Last BM approx 10 days ago. FINDINGS: LOWER CHEST: Emphysematous changes of the visualized lung bases. Coronary artery calcification. LIVER: The liver is unremarkable. GALLBLADDER AND BILE DUCTS: Gallbladder is unremarkable. No biliary ductal dilatation. SPLEEN: No acute abnormality. PANCREAS: No acute abnormality. ADRENAL GLANDS: No acute abnormality. KIDNEYS, URETERS AND BLADDER: Fluid dense lesions within bilateral kidneys likely represent simple renal cysts. Per consensus, no follow-up is needed for simple Bosniak type 1 and 2 renal cysts, unless the patient has a malignancy history or risk factors. No stones in the kidneys or ureters. No hydronephrosis. No perinephric or periureteral stranding. No filling defect of the bilateral partially visualized collecting systems on delayed imaging. Urinary bladder is unremarkable. GI AND BOWEL: Stomach demonstrates no acute abnormality. Appendix is normal. Stool throughout the ascending colon and transverse colon. No small bowel or large bowel wall thickening or dilatation. PERITONEUM AND RETROPERITONEUM: No ascites. No free air.  VASCULATURE: Aorta is normal in caliber. Severe atherosclerotic plaque. LYMPH NODES: No lymphadenopathy. REPRODUCTIVE ORGANS: The uterus and bilateral adnexal regions are unremarkable. BONES AND SOFT TISSUES: Stable grade 1 anterolisthesis of L3 on L4 in the setting of L3-L4 posterolateral and interbody surgical hardware fusion. No focal soft tissue abnormality. IMPRESSION: 1. No acute abnormality in the abdomen or pelvis. 2. Stool throughout the ascending and transverse colon without evidence of obstruction. Electronically signed by: Morgane Naveau MD 10/12/2024 06:56 PM EDT RP Workstation: [id number]     Procedures   Medications Ordered in the ED  ondansetron  (ZOFRAN -ODT) disintegrating tablet 4 mg (4 mg Oral Given 10/12/24 1739)  iohexol  (OMNIPAQUE ) 300 MG/ML solution 80 mL (80 mLs Intravenous Contrast Given 10/12/24 1814)  sodium chloride  0.9 % bolus 1,000 mL (0 mLs Intravenous Stopped 10/12/24 1930)    73 y.o. female presents to the ED with complaints of abdominal pain, constipation, nausea, this involves an extensive number of treatment options, and is a complaint that carries with it a high risk of complications and morbidity.  The differential diagnosis includes bowel obstruction, volvulus, intussusception, cholecystitis, AAA, UTI, pyelonephritis, nephrolithiasis, ACS pancreatitis (Ddx)  On arrival pt is nontoxic, vitals mildly hypertensive open vitals unremarkable. Exam significant for generalized pain to palpation throughout entire abdomen  Additional history obtained from patient managed by primary care for heart failure hyperlipidemia hypertension.  Managed with spironolactone , carvedilol , valsartan , Repatha , spironolactone , furosemide   I ordered medication Zofran  for nausea  Lab Tests:  I Ordered, reviewed, and interpreted labs, which included: CMP, CBC, lipase, urinalysis,  Imaging Studies ordered:  I ordered imaging studies which included CT abdomen pelvis with contrast, I  independently visualized and interpreted imaging which showed constipation no other acute findings.  ED Course:   73 year old female presents to the ED with complaints of abdominal pain and constipation x 10 days.  Patient sitting comfortably in ED bed in no distress nontoxic-appearing, vitals are unremarkable on initial exam.  Patient has generalized tenderness around entire abdomen, abdomen is soft, no obvious distention on initial exam.  Patient has significant history of appendectomy.  Patient denies any urinary symptoms or incontinence.  Patient denies any vomiting but does endorse some mild nausea.  Patient denies any shortness of breath chest pain lungs are clear to auscultation.  Patient advised decreased food intake over  the last week as well.  Patient also advises her fluid intake has been decreased.  CT scan resulted with no acute abnormalities but did note stool without any obstruction or perforation.  Lab work was unremarkable for any concerning findings.  On reevaluation patient initially declined further management of constipation.  Attending spoke with the patient and patient agreed to do enema.  After enema patient had reported bowel movement.  Blood pressure was reevaluated and it was in appropriate levels.  Patient had not taken her BP medication tonight yet but intends to when Meghan Lynn gets home.  Patient denies any new complaints and reports Meghan Lynn feels much better after a bowel movement.  Patient was prescribed suppositories and advised bowel regimen of MiraLAX  and enemas at home.  Patient was given strict return precautions and agreed to treatment plan.  Portions of this note were generated with Scientist, clinical (histocompatibility and immunogenetics). Dictation errors may occur despite best attempts at proofreading.   Final diagnoses:  Constipation, unspecified constipation type    ED Discharge Orders          Ordered    glycerin adult 2 g suppository  As needed        10/12/24 2049                Myriam Fonda RAMAN, [place] 10/13/24 GLORIANNE Armenta Canning, MD 10/13/24 1517

## 2024-10-12 NOTE — ED Notes (Signed)
 Patient is being discharged from the Urgent Care and sent to the Emergency Department via private vehicle . Per [place]  NP, patient is in need of higher level of care due to constipation x 10 days. Patient is aware and verbalizes understanding of plan of care.   Vitals:   10/12/24 1422 10/12/24 1444  BP: (!) 176/103 (!) 163/97  Pulse: 80   Resp: 18   Temp: 98.1 F (36.7 C)   SpO2: 93%

## 2024-10-12 NOTE — ED Notes (Signed)
 Pt transferred from [facility] to ED RM 2. Assuming pt care at this time.

## 2024-10-12 NOTE — ED Provider Notes (Signed)
 [facility]    CSN: [id number] Arrival date & time: 10/12/24  1352      History   Chief Complaint Chief Complaint  Patient presents with   Constipation    Entered by patient    HPI Meghan Lynn is a 73 y.o. female.   Patient presents to clinic over concern of abdominal pain, constipation, and abdominal bloating. Has not had a bowel movement in 10 days.   Patient saw her PCP for this issue on 10/13 and was prescribed laxatives.  Has been taking this without any bowel movement.  Has also tried to enemas without bowel movement.  Reports getting tired of sitting on the toilet, feels like she is straining and nothing is coming out.  Had imaging and labs drawn, abdominal x-ray showed heavy stool burden of the right side.  Has been using the MiraLAX  for the last 5 days and has not had a bowel movement.  She was initially having abdominal pain and distention only throughout the day, now it is at night as well and it is interfering with her ability to sleep.  Current pain is severe.  Hunched over on triage.  Some nausea, no vomiting.  The history is provided by the patient and medical records.  Constipation   Past Medical History:  Diagnosis Date   Active smoker    Anginal pain    Arthritis    Asthma    patient denies   Bronchitis due to tobacco use    Burning sensation of feet    Coronary artery disease    3 stents to RCA   Dilatation of esophagus    Gastritis    GERD (gastroesophageal reflux disease)    H/O pyloric stenosis 01/31/2020   Headache(784.0)    cluster every day   Headache, migraine    daily   Hyperlipidemia    Hypertension    Lower GI bleed 06/20/2020   Peptic ulcer disease    Pneumonia    Shortness of breath dyspnea    with exertion    Patient Active Problem List   Diagnosis Date Noted   Puncture wound of left foot 08/22/2024   Statin intolerance 07/10/2024   Former tobacco use 07/03/2024   Chronic obstructive pulmonary disease  (HCC) 07/03/2024   Lumbar radiculopathy 07/03/2024   Intractable chronic migraine without aura and without status migrainosus 07/03/2024   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   NSTEMI (non-ST elevated myocardial infarction) (HCC) 02/06/2024   Erythrocytosis 02/06/2024   Asthma 09/16/2022   Symptomatic anemia 06/21/2020   Peptic ulcer disease    Hyperlipidemia    Dilatation of esophagus    Burning sensation of feet    Bronchitis due to tobacco use    Arthritis    Weakness of both lower extremities 06/22/2017   Shortness of breath 09/29/2016   GERD (gastroesophageal reflux disease) 09/30/2015   Coronary artery disease due to lipid rich plaque 08/29/2014   Essential hypertension 08/29/2014   Dyslipidemia 08/29/2014   Spondylolisthesis of l3-4 01/27/2013    Past Surgical History:  Procedure Laterality Date   BACK SURGERY     BALLOON DILATION N/A 05/30/2013   Procedure: BALLOON DILATION;  Surgeon: Norleen JAYSON Hint, MD;  Location: [facility] ENDOSCOPY;  Service: Endoscopy;  Laterality: N/A;   BALLOON DILATION N/A 08/02/2013   Procedure: BALLOON DILATION;  Surgeon: Norleen JAYSON Hint, MD;  Location: [facility] ENDOSCOPY;  Service: Endoscopy;  Laterality: N/A;   BALLOON DILATION N/A 04/29/2016   Procedure: BALLOON DILATION;  Surgeon: Norleen Hint, MD;  Location: [facility] ENDOSCOPY;  Service: Endoscopy;  Laterality: N/A;   BALLOON DILATION N/A 06/16/2020   Procedure: BALLOON DILATION;  Surgeon: Saintclair Jasper, MD;  Location: [facility] ENDOSCOPY;  Service: Gastroenterology;  Laterality: N/A;   BIOPSY  06/16/2020   Procedure: BIOPSY;  Surgeon: Saintclair Jasper, MD;  Location: [facility] ENDOSCOPY;  Service: Gastroenterology;;   CARDIAC CATHETERIZATION  03/07/2002   moderte CAD 80% (mid RCA) - Dr. Elsie Ona   CARDIAC CATHETERIZATION  09/13/2003   Cypher 2.5x33mm and 2.5x18 and Taxus 2.75x10mm to distal, mid, prox RCA (Dr. FABIENE Hasten)   CARDIOLITE MYOCARDIAL PERFUSION STUDY  05/2006   positive bruce protocol, low risk, EF 80%   CARDIOVASCULAR STRESS TEST  2010    COLONOSCOPY     COLONOSCOPY WITH PROPOFOL  N/A 06/16/2020   Procedure: COLONOSCOPY WITH PROPOFOL ;  Surgeon: Saintclair Jasper, MD;  Location: [facility] ENDOSCOPY;  Service: Gastroenterology;  Laterality: N/A;   COLONOSCOPY WITH PROPOFOL  N/A 06/21/2020   Procedure: COLONOSCOPY WITH PROPOFOL ;  Surgeon: Elicia Claw, MD;  Location: [facility] ENDOSCOPY;  Service: Gastroenterology;  Laterality: N/A;   CORONARY ANGIOPLASTY  2004   stents x 3   CORONARY ARTERY BYPASS GRAFT N/A 02/10/2024   Procedure: CORONARY ARTERY BYPASS GRAFTING (CABG) TIMES THREE USING LEFT INTERNAL MAMMARY ARTERY AND ENDOSCOPICALLY HARVESTED RIGHT GREATER SAPHENOUS VEIN.;  Surgeon: Kerrin Elspeth BROCKS, MD;  Location: [facility] OR;  Service: Open Heart Surgery;  Laterality: N/A;   DILATATION & CURETTAGE/HYSTEROSCOPY WITH MYOSURE N/A 08/19/2016   Procedure: DILATATION & CURETTAGE/HYSTEROSCOPY WITH MYOSURE;  Surgeon: Dickie Carder, MD;  Location: [facility] ORS;  Service: Gynecology;  Laterality: N/A;   ESOPHAGOGASTRODUODENOSCOPY N/A 05/30/2013   Procedure: ESOPHAGOGASTRODUODENOSCOPY (EGD);  Surgeon: Norleen BROCKS Hint, MD;  Location: [facility] ENDOSCOPY;  Service: Endoscopy;  Laterality: N/A;   ESOPHAGOGASTRODUODENOSCOPY N/A 08/02/2013   Procedure: ESOPHAGOGASTRODUODENOSCOPY (EGD);  Surgeon: Norleen BROCKS Hint, MD;  Location: [facility] ENDOSCOPY;  Service: Endoscopy;  Laterality: N/A;  barb /ja   ESOPHAGOGASTRODUODENOSCOPY (EGD) WITH PROPOFOL  N/A 04/29/2016   Procedure: ESOPHAGOGASTRODUODENOSCOPY (EGD) WITH PROPOFOL ;  Surgeon: Norleen Hint, MD;  Location: [facility] ENDOSCOPY;  Service: Endoscopy;  Laterality: N/A;   ESOPHAGOGASTRODUODENOSCOPY (EGD) WITH PROPOFOL  N/A 06/16/2020   Procedure: ESOPHAGOGASTRODUODENOSCOPY (EGD) WITH PROPOFOL ;  Surgeon: Saintclair Jasper, MD;  Location: [facility] ENDOSCOPY;  Service: Gastroenterology;  Laterality: N/A;   FRACTURE SURGERY     HEMOSTASIS CLIP PLACEMENT  06/21/2020   Procedure: HEMOSTASIS CLIP PLACEMENT;  Surgeon: Elicia Claw, MD;  Location: [facility] ENDOSCOPY;  Service:  Gastroenterology;;   IABP INSERTION Right 02/08/2024   Procedure: IABP Insertion;  Surgeon: Wendel Lurena POUR, MD;  Location: [facility] INVASIVE CV LAB;  Service: Cardiovascular;  Laterality: Right;   LEFT HEART CATH AND CORONARY ANGIOGRAPHY N/A 02/08/2024   Procedure: LEFT HEART CATH AND CORONARY ANGIOGRAPHY;  Surgeon: Wendel Lurena POUR, MD;  Location: [facility] INVASIVE CV LAB;  Service: Cardiovascular;  Laterality: N/A;   ORIF ELBOW FRACTURE  2006   POLYPECTOMY  06/16/2020   Procedure: POLYPECTOMY;  Surgeon: Saintclair Jasper, MD;  Location: [facility] ENDOSCOPY;  Service: Gastroenterology;;   POLYPECTOMY  06/21/2020   Procedure: POLYPECTOMY;  Surgeon: Elicia Claw, MD;  Location: [facility] ENDOSCOPY;  Service: Gastroenterology;;   TEE WITHOUT CARDIOVERSION N/A 02/10/2024   Procedure: TRANSESOPHAGEAL ECHOCARDIOGRAM (TEE);  Surgeon: Kerrin Elspeth BROCKS, MD;  Location: [facility] OR;  Service: Open Heart Surgery;  Laterality: N/A;   TRANSTHORACIC ECHOCARDIOGRAM  07/2008   normal LV function, mild MR, mild TR, trace pulm valve regurg    OB History   No obstetric history  on file.      Home Medications    Prior to Admission medications   Medication Sig Start Date End Date Taking? Authorizing Provider  albuterol  (VENTOLIN  HFA) 108 (90 Base) MCG/ACT inhaler INHALE 2 PUFFS EVERY 4-6 HOURS AS NEEDED FOR SHORTNESS OF BREATH OR WHEEZING 08/24/24   Billy Knee, FNP  aspirin  EC 81 MG tablet Take 1 tablet (81 mg total) by mouth daily. Swallow whole. 02/21/24   Barrett, Rocky SAUNDERS, PA-C  Biotin 1 MG CAPS Take 1 mg by mouth daily.     [provider]  carvedilol  (COREG ) 6.25 MG tablet Take 1 tablet (6.25 mg total) by mouth 2 (two) times daily with a meal. 07/18/24   Billy Knee, FNP  clopidogrel  (PLAVIX ) 75 MG tablet Take 1 tablet (75 mg total) by mouth daily. 07/10/24   Billy Knee, FNP  dapagliflozin  propanediol (FARXIGA ) 10 MG TABS tablet Take 1 tablet (10 mg total) by mouth daily. 07/10/24   Billy Knee, FNP  Evolocumab  (REPATHA   SURECLICK) 140 MG/ML SOAJ Inject 140 mg into the skin every 14 (fourteen) days. 07/19/24   Hilty, Vinie BROCKS, MD  fluticasone -salmeterol (WIXELA INHUB ) 100-50 MCG/ACT AEPB Inhale 1 puff into the lungs 2 (two) times daily. 07/10/24   Billy Knee, FNP  furosemide  (LASIX ) 40 MG tablet Take 1 tablet (40 mg total) by mouth daily. X 7 days, then transition to daily as needed for weight gain of 3-5 lbs in 24-48 hours Patient taking differently: Take 40 mg by mouth daily as needed for edema. X 7 days, then transition to daily as needed for weight gain of 3-5 lbs in 24-48 hours 02/21/24   Barrett, Erin R, PA-C  Multiple Vitamin (MULTIVITAMIN WITH MINERALS) TABS Take 1 tablet by mouth daily.    [provider]  nitroGLYCERIN  (NITROSTAT ) 0.4 MG SL tablet Place 1 tablet (0.4 mg total) under the tongue every 5 (five) minutes as needed for chest pain. 04/26/23 10/08/24  Loistine Sober, NP  omeprazole (PRILOSEC) 20 MG capsule Take 20 mg by mouth daily.    [provider]  potassium chloride  SA (KLOR-CON  M) 20 MEQ tablet Take 1 tablet (20 mEq total) by mouth daily. X 7 days, then use only on days you take Lasix  Patient not taking: Reported on 10/08/2024 02/21/24   Barrett, Rocky SAUNDERS, PA-C  spironolactone  (ALDACTONE ) 25 MG tablet Take 0.5 tablets (12.5 mg total) by mouth daily. 07/10/24   Billy Knee, FNP  tiZANidine (ZANAFLEX) 2 MG tablet Take 1 tablet (2 mg total) by mouth every 6 (six) hours as needed for muscle spasms. 03/07/24   Barrett, Erin R, PA-C  valsartan  (DIOVAN ) 320 MG tablet Take 1 tablet (320 mg total) by mouth daily. 07/17/24   Hilty, Vinie BROCKS, MD    Family History Family History  Problem Relation Age of Onset   Diabetes Mother    CAD Mother        CABG   Bone cancer Father    Hypertension Father    Heart disease Brother    Kidney disease Sister    Cancer Sister    Diabetes Sister    Hyperlipidemia Sister    Hypertension Sister     Social History Social History    Tobacco Use   Smoking status: Former    Current packs/day: 0.60    Average packs/day: 0.6 packs/day for 45.0 years (27.0 ttl pk-yrs)    Types: Cigarettes   Smokeless tobacco: Never   Tobacco comments:    Smokes 7 packs  a week of cigarettes. 05/11/23 Tay  Vaping Use   Vaping status: Never Used  Substance Use Topics   Alcohol use: Not Currently    Comment: occ   Drug use: No     Allergies   Rosuvastatin , Atorvastatin , Latex, and Propoxyphene   Review of Systems Review of Systems  Per HPI  Physical Exam Triage Vital Signs ED Triage Vitals  Encounter Vitals Group     BP 10/12/24 1422 (!) 176/103     Girls Systolic BP Percentile --      Girls Diastolic BP Percentile --      Boys Systolic BP Percentile --      Boys Diastolic BP Percentile --      Pulse Rate 10/12/24 1422 80     Resp 10/12/24 1422 18     Temp 10/12/24 1422 98.1 F (36.7 C)     Temp Source 10/12/24 1422 Oral     SpO2 10/12/24 1422 93 %     Weight 10/12/24 1422 125 lb (56.7 kg)     Height 10/12/24 1422 5' 1 (1.549 m)     Head Circumference --      Peak Flow --      Pain Score 10/12/24 1443 10     Pain Loc --      Pain Education --      Exclude from Growth Chart --    No data found.  Updated Vital Signs BP (!) 163/97 (BP Location: Right Arm)   Pulse 80   Temp 98.1 F (36.7 C) (Oral)   Resp 18   Ht 5' 1 (1.549 m)   Wt 125 lb (56.7 kg)   SpO2 93%   BMI 23.62 kg/m   Visual Acuity Right Eye Distance:   Left Eye Distance:   Bilateral Distance:    Right Eye Near:   Left Eye Near:    Bilateral Near:     Physical Exam Vitals and nursing note reviewed.  HENT:     Head: Normocephalic and atraumatic.     Right Ear: External ear normal.     Left Ear: External ear normal.     Nose: Nose normal.     Mouth/Throat:     Mouth: Mucous membranes are moist.  Eyes:     Conjunctiva/sclera: Conjunctivae normal.  Cardiovascular:     Rate and Rhythm: Normal rate.  Pulmonary:     Effort:  Pulmonary effort is normal. No respiratory distress.  Abdominal:     General: Bowel sounds are normal. There is distension.     Tenderness: There is abdominal tenderness.  Musculoskeletal:        General: Normal range of motion.  Skin:    General: Skin is warm and dry.  Neurological:     General: No focal deficit present.     Mental Status: She is alert and oriented to person, place, and time.  Psychiatric:        Mood and Affect: Mood normal.        Behavior: Behavior normal.      UC Treatments / Results  Labs (all labs ordered are listed, but only abnormal results are displayed) Labs Reviewed - No data to display  EKG   Radiology No results found.  Procedures Procedures (including critical care time)  Medications Ordered in UC Medications - No data to display  Initial Impression / Assessment and Plan / UC Course  I have reviewed the triage vital signs and the nursing notes.  Pertinent labs &  imaging results that were available during my care of the patient were reviewed by me and considered in my medical decision making (see chart for details).  Vitals and triage reviewed, patient is hemodynamically stable.  Abdomen with distention.  Generalized tenderness, grimacing and guarding.  Does have active bowel sounds.  Concern over lack of bowel movement for 10 days, severe abdominal pain, and no results with enema or MiraLAX .  Discussed risk of complications such as obstruction that would need to be handled at the nearest emergency department for further advanced evaluation.  Limitations of urgent care discussed as well.  Do not feel as if repeat abdominal imaging would be beneficial, continues to eat and have no BM, suspect worsening constipation.  Patient agreeable to plan and will head to ED via POV.     Final Clinical Impressions(s) / UC Diagnoses   Final diagnoses:  Constipation, unspecified constipation type     Discharge Instructions      I am concerned  over your severe abdominal pain, abdominal distention, and lack of bowel movement over the last 10 days despite enema and laxatives.  Please head to the emergency department for further advanced evaluation.    ED Prescriptions   None    PDMP not reviewed this encounter.   Dreama Ofilia Rayon  N, FNP 10/12/24 [phone]

## 2024-10-12 NOTE — ED Notes (Signed)
 Patient transported to CT

## 2024-10-12 NOTE — ED Triage Notes (Signed)
 Pt presents with a chief complaint of constipation. Last BM was ten days ago. Went to primary care and was advised to take Miralax . Pt is currently taking one dose a day. Taken this morning. Pt states her abdomen is very distended. Did have an abdominal x-ray on 10/14 and it showed she was very constipated. Currently rates overall pain a 10/10. Pt is hunched over in triage. States she is having abdominal cramping but feels no urge to use bathroom.

## 2024-10-12 NOTE — ED Provider Notes (Signed)
 I provided a substantive portion of the care of this patient.  I personally made/approved the management plan for this patient and take responsibility for the patient management. {Remember to document shared critical care using edcritical dot phrase:1} EKG Interpretation Date/Time:  Friday October 12 2024 18:29:50 EDT Ventricular Rate:  75 PR Interval:  149 QRS Duration:  72 QT Interval:  358 QTC Calculation: 400 R Axis:   41  Text Interpretation: Sinus rhythm Multiple ventricular premature complexes no sig change from previous Confirmed by Armenta Canning [phone]) on 10/12/2024 6:54:19 PM

## 2024-10-13 ENCOUNTER — Other Ambulatory Visit: Payer: Self-pay | Admitting: Internal Medicine

## 2024-10-15 ENCOUNTER — Emergency Department (HOSPITAL_COMMUNITY)

## 2024-10-15 ENCOUNTER — Other Ambulatory Visit: Payer: Self-pay

## 2024-10-15 ENCOUNTER — Emergency Department (HOSPITAL_COMMUNITY)
Admission: EM | Admit: 2024-10-15 | Discharge: 2024-10-15 | Disposition: A | Discharge status: Home / Self Care | Attending: Emergency Medicine | Admitting: Emergency Medicine

## 2024-10-15 ENCOUNTER — Encounter (HOSPITAL_COMMUNITY): Payer: Self-pay

## 2024-10-15 ENCOUNTER — Ambulatory Visit: Payer: Self-pay

## 2024-10-15 DIAGNOSIS — Z7982 Long term (current) use of aspirin: Secondary | ICD-10-CM | POA: Diagnosis not present

## 2024-10-15 DIAGNOSIS — N289 Disorder of kidney and ureter, unspecified: Secondary | ICD-10-CM | POA: Diagnosis not present

## 2024-10-15 DIAGNOSIS — R109 Unspecified abdominal pain: Secondary | ICD-10-CM | POA: Diagnosis not present

## 2024-10-15 DIAGNOSIS — K5900 Constipation, unspecified: Secondary | ICD-10-CM | POA: Insufficient documentation

## 2024-10-15 DIAGNOSIS — Z91040 Latex allergy status: Secondary | ICD-10-CM | POA: Diagnosis not present

## 2024-10-15 DIAGNOSIS — I10 Essential (primary) hypertension: Secondary | ICD-10-CM | POA: Insufficient documentation

## 2024-10-15 DIAGNOSIS — I2510 Atherosclerotic heart disease of native coronary artery without angina pectoris: Secondary | ICD-10-CM | POA: Diagnosis not present

## 2024-10-15 DIAGNOSIS — R1010 Upper abdominal pain, unspecified: Secondary | ICD-10-CM | POA: Diagnosis not present

## 2024-10-15 LAB — CBC WITH DIFFERENTIAL/PLATELET
Abs Immature Granulocytes: 0.11 K/uL — ABNORMAL HIGH (ref 0.00–0.07)
Basophils Absolute: 0.1 K/uL (ref 0.0–0.1)
Basophils Relative: 1 %
Eosinophils Absolute: 0 K/uL (ref 0.0–0.5)
Eosinophils Relative: 0 %
HCT: 55.1 % — ABNORMAL HIGH (ref 36.0–46.0)
Hemoglobin: 17.6 g/dL — ABNORMAL HIGH (ref 12.0–15.0)
Immature Granulocytes: 1 %
Lymphocytes Relative: 21 %
Lymphs Abs: 1.9 K/uL (ref 0.7–4.0)
MCH: 25.4 pg — ABNORMAL LOW (ref 26.0–34.0)
MCHC: 31.9 g/dL (ref 30.0–36.0)
MCV: 79.4 fL — ABNORMAL LOW (ref 80.0–100.0)
Monocytes Absolute: 0.6 K/uL (ref 0.1–1.0)
Monocytes Relative: 6 %
Neutro Abs: 6.4 K/uL (ref 1.7–7.7)
Neutrophils Relative %: 71 %
Platelets: 247 K/uL (ref 150–400)
RBC: 6.94 MIL/uL — ABNORMAL HIGH (ref 3.87–5.11)
RDW: 14.9 % (ref 11.5–15.5)
WBC: 9 K/uL (ref 4.0–10.5)
nRBC: 0 % (ref 0.0–0.2)

## 2024-10-15 LAB — LIPASE, BLOOD: Lipase: 33 U/L (ref 11–51)

## 2024-10-15 LAB — COMPREHENSIVE METABOLIC PANEL WITH GFR
ALT: <5 U/L (ref 0–44)
AST: 17 U/L (ref 15–41)
Albumin: 4 g/dL (ref 3.5–5.0)
Alkaline Phosphatase: 87 U/L (ref 38–126)
Anion gap: 12 (ref 5–15)
BUN: 10 mg/dL (ref 8–23)
CO2: 24 mmol/L (ref 22–32)
Calcium: 9.7 mg/dL (ref 8.9–10.3)
Chloride: 103 mmol/L (ref 98–111)
Creatinine, Ser: 0.97 mg/dL (ref 0.44–1.00)
GFR, Estimated: >60 mL/min (ref >60)
Glucose, Bld: 142 mg/dL — ABNORMAL HIGH (ref 70–99)
Potassium: 3.6 mmol/L (ref 3.5–5.1)
Sodium: 139 mmol/L (ref 135–145)
Total Bilirubin: 0.3 mg/dL (ref 0.0–1.2)
Total Protein: 7.6 g/dL (ref 6.5–8.1)

## 2024-10-15 LAB — URINALYSIS, ROUTINE W REFLEX MICROSCOPIC
Bacteria, UA: NONE SEEN
Bilirubin Urine: NEGATIVE
Glucose, UA: >=500 mg/dL — AB
Hgb urine dipstick: NEGATIVE
Ketones, ur: NEGATIVE mg/dL
Nitrite: NEGATIVE
Protein, ur: 100 mg/dL — AB
Specific Gravity, Urine: 1.021 (ref 1.005–1.030)
pH: 5 (ref 5.0–8.0)

## 2024-10-15 MED ORDER — FENTANYL CITRATE (PF) 50 MCG/ML IJ SOSY
50.0000 ug | PREFILLED_SYRINGE | Freq: Once | INTRAMUSCULAR | Status: AC
  Administered 2024-10-15: 50 ug via INTRAVENOUS
  Filled 2024-10-15: qty 1

## 2024-10-15 MED ORDER — IOHEXOL 300 MG/ML  SOLN
100.0000 mL | Freq: Once | INTRAMUSCULAR | Status: AC | PRN
  Administered 2024-10-15: 100 mL via INTRAVENOUS

## 2024-10-15 NOTE — Telephone Encounter (Signed)
 FYI Only or Action Required?: FYI only for provider.  Patient was last seen in primary care on 10/08/2024 by Billy Knee, FNP.  Called Nurse Triage reporting Abdominal Pain.  Symptoms began several days ago.  Interventions attempted: Rest, hydration, or home remedies.  Symptoms are: unchanged.  Triage Disposition: Go to ED Now (Notify PCP)  Patient/caregiver understands and will follow disposition?: Yes  **Referred to ED for symptoms**                       Copied from CRM #[id number]. Topic: Clinical - Red Word Triage >> Oct 15, 2024  8:28 AM [place] MATSU wrote: Red Word that prompted transfer to Nurse Triage: pain having abdominal pain and not feeling well and and right side is impacted. Reason for Disposition  [1] SEVERE pain (e.g., excruciating) AND [2] present > 1 hour  Answer Assessment - Initial Assessment Questions 1. LOCATION: Where does it hurt?      Whole abdomen, mainly right lower side  2. RADIATION: Does the pain shoot anywhere else? (e.g., chest, back)     No   3. ONSET: When did the pain begin? (e.g., minutes, hours or days ago)      Last Wednesday, seen in office, and ED, for similar symptoms. She was given an enema in the ED last Friday, symptoms returned   4. SUDDEN: Gradual or sudden onset?     Gradual  5. PATTERN Does the pain come and go, or is it constant?     Constant   6. SEVERITY: How bad is the pain?  (e.g., Scale 1-10; mild, moderate, or severe)  Severe she rated pain as a 15/10  7. RECURRENT SYMPTOM: Have you ever had this type of stomach pain before? If Yes, ask: When was the last time? and What happened that time?      Ongoing x 12 days   8. CAUSE: What do you think is causing the stomach pain? (e.g., gallstones, recent abdominal surgery)     Unsure, ED last Friday found some constipation   9. RELIEVING/AGGRAVATING FACTORS: What makes it better or worse? (e.g., antacids, bending or twisting motion,  bowel movement)    Nothing   10. OTHER SYMPTOMS: Do you have any other symptoms? (e.g., back pain, diarrhea, fever, urination pain, vomiting)   Constipation, last BM was last Friday during ED visit with the [facility]  ED advised to follow-up with PCP.  Protocols used: Abdominal Pain - Female-A-AH

## 2024-10-15 NOTE — Telephone Encounter (Signed)
 Patient is currently at the ER. Dm/cma

## 2024-10-15 NOTE — Discharge Instructions (Signed)
 Your history, exam, and workup today did not reveal evidence of acute bowel obstruction but showed some proximal colonic stool burden likely causing your symptoms.  There was no evidence of clear infection, abscess, or diverticulitis and we feel you are safe for discharge home now.  Please maintain your hydration and use over-the-counter treatment to help with your bowels but please consider follow-up with the GI team given your persistent symptoms.  If any symptoms change or worsen acutely, please return to the nearest emergency department.

## 2024-10-15 NOTE — Telephone Encounter (Signed)
 Pt is currently at the ED. Please schedule ED f/u once discharged.

## 2024-10-15 NOTE — ED Provider Notes (Signed)
 [place] EMERGENCY DEPARTMENT AT [facility] Provider Note   CSN: [id number] Arrival date & time: 10/15/24  9080     Patient presents with: Constipation   Meghan Lynn is a 73 y.o. female.   The history is provided by the patient and medical records. No language interpreter was used.  Constipation Severity:  Severe Time since last bowel movement:  12 days Timing:  Constant Progression:  Waxing and waning Chronicity:  New Stool description:  Small Relieved by:  Nothing Worsened by:  Nothing Associated symptoms: abdominal pain and nausea   Associated symptoms: no back pain, no diarrhea, no dysuria, no fever, no urinary retention and no vomiting        Prior to Admission medications   Medication Sig Start Date End Date Taking? Authorizing Provider  albuterol  (VENTOLIN  HFA) 108 (90 Base) MCG/ACT inhaler INHALE 2 PUFFS EVERY 4-6 HOURS AS NEEDED FOR SHORTNESS OF BREATH OR WHEEZING 08/24/24   Billy Knee, FNP  aspirin  EC 81 MG tablet Take 1 tablet (81 mg total) by mouth daily. Swallow whole. 02/21/24   Barrett, Rocky SAUNDERS, PA-C  Biotin 1 MG CAPS Take 1 mg by mouth daily.     [provider]  carvedilol  (COREG ) 6.25 MG tablet Take 1 tablet (6.25 mg total) by mouth 2 (two) times daily with a meal. 07/18/24   Billy Knee, FNP  clopidogrel  (PLAVIX ) 75 MG tablet Take 1 tablet (75 mg total) by mouth daily. 07/10/24   Billy Knee, FNP  dapagliflozin  propanediol (FARXIGA ) 10 MG TABS tablet Take 1 tablet (10 mg total) by mouth daily. 07/10/24   Billy Knee, FNP  Evolocumab  (REPATHA  SURECLICK) 140 MG/ML SOAJ Inject 140 mg into the skin every 14 (fourteen) days. 07/19/24   Hilty, Vinie BROCKS, MD  fluticasone -salmeterol (WIXELA INHUB ) 100-50 MCG/ACT AEPB Inhale 1 puff into the lungs 2 (two) times daily. 07/10/24   Billy Knee, FNP  furosemide  (LASIX ) 40 MG tablet Take 1 tablet (40 mg total) by mouth daily. X 7 days, then transition to daily as needed for weight gain of 3-5  lbs in 24-48 hours Patient taking differently: Take 40 mg by mouth daily as needed for edema. X 7 days, then transition to daily as needed for weight gain of 3-5 lbs in 24-48 hours 02/21/24   Barrett, Erin R, PA-C  glycerin adult 2 g suppository Place 1 suppository rectally as needed for constipation. 10/12/24   Bundy, Joshua S, PA-C  Multiple Vitamin (MULTIVITAMIN WITH MINERALS) TABS Take 1 tablet by mouth daily.    [provider]  nitroGLYCERIN  (NITROSTAT ) 0.4 MG SL tablet Place 1 tablet (0.4 mg total) under the tongue every 5 (five) minutes as needed for chest pain. 04/26/23 10/08/24  Loistine Sober, NP  omeprazole (PRILOSEC) 20 MG capsule Take 20 mg by mouth daily.    [provider]  potassium chloride  SA (KLOR-CON  M) 20 MEQ tablet Take 1 tablet (20 mEq total) by mouth daily. X 7 days, then use only on days you take Lasix  Patient not taking: Reported on 10/08/2024 02/21/24   Barrett, Rocky SAUNDERS, PA-C  spironolactone  (ALDACTONE ) 25 MG tablet Take 0.5 tablets (12.5 mg total) by mouth daily. 07/10/24   Billy Knee, FNP  tiZANidine (ZANAFLEX) 2 MG tablet Take 1 tablet (2 mg total) by mouth every 6 (six) hours as needed for muscle spasms. 03/07/24   Barrett, Erin R, PA-C  valsartan  (DIOVAN ) 320 MG tablet Take 1 tablet (320 mg total) by mouth daily. 07/17/24  Mona Vinie BROCKS, MD    Allergies: Rosuvastatin , Atorvastatin , Latex, and Propoxyphene    Review of Systems  Constitutional:  Negative for chills, fatigue and fever.  HENT:  Negative for congestion.   Respiratory:  Negative for cough, chest tightness, shortness of breath and wheezing.   Cardiovascular:  Negative for chest pain.  Gastrointestinal:  Positive for abdominal pain, constipation and nausea. Negative for diarrhea and vomiting.  Genitourinary:  Negative for dysuria, flank pain and frequency.  Musculoskeletal:  Negative for back pain, neck pain and neck stiffness.  Neurological:  Negative for numbness and headaches.   Psychiatric/Behavioral:  Negative for agitation and confusion.   All other systems reviewed and are negative.   Updated Vital Signs BP (!) 143/99 (BP Location: Left Arm)   Pulse 87   Temp 97.6 F (36.4 C) (Oral)   Resp 16   SpO2 99%   Physical Exam Vitals and nursing note reviewed.  Constitutional:      General: She is not in acute distress.    Appearance: She is well-developed. She is not ill-appearing, toxic-appearing or diaphoretic.  HENT:     Head: Normocephalic and atraumatic.     Nose: Nose normal.     Mouth/Throat:     Mouth: Mucous membranes are moist.     Pharynx: No oropharyngeal exudate or posterior oropharyngeal erythema.  Eyes:     Extraocular Movements: Extraocular movements intact.     Conjunctiva/sclera: Conjunctivae normal.     Pupils: Pupils are equal, round, and reactive to light.  Cardiovascular:     Rate and Rhythm: Normal rate and regular rhythm.     Heart sounds: No murmur heard. Pulmonary:     Effort: Pulmonary effort is normal. No respiratory distress.     Breath sounds: Normal breath sounds. No wheezing, rhonchi or rales.  Chest:     Chest wall: No tenderness.  Abdominal:     General: Abdomen is flat.     Palpations: Abdomen is soft.     Tenderness: There is abdominal tenderness. There is no guarding or rebound.  Musculoskeletal:        General: No swelling or tenderness.     Cervical back: Neck supple.  Skin:    General: Skin is warm and dry.     Capillary Refill: Capillary refill takes less than 2 seconds.     Findings: No erythema.  Neurological:     General: No focal deficit present.     Mental Status: She is alert.  Psychiatric:        Mood and Affect: Mood normal.     (all labs ordered are listed, but only abnormal results are displayed) Labs Reviewed  COMPREHENSIVE METABOLIC PANEL WITH GFR - Abnormal; Notable for the following components:      Result Value   Glucose, Bld 142 (*)    All other components within normal limits   CBC WITH DIFFERENTIAL/PLATELET - Abnormal; Notable for the following components:   RBC 6.94 (*)    Hemoglobin 17.6 (*)    HCT 55.1 (*)    MCV 79.4 (*)    MCH 25.4 (*)    Abs Immature Granulocytes 0.11 (*)    All other components within normal limits  URINALYSIS, ROUTINE W REFLEX MICROSCOPIC - Abnormal; Notable for the following components:   APPearance HAZY (*)    Glucose, UA >=500 (*)    Protein, ur 100 (*)    Leukocytes,Ua SMALL (*)    All other components within normal limits  LIPASE, BLOOD    EKG: None  Radiology: US  Abdomen Limited RUQ (LIVER/GB) Result Date: 10/15/2024 CLINICAL DATA:  Upper abdominal pain EXAM: ULTRASOUND ABDOMEN LIMITED RIGHT UPPER QUADRANT COMPARISON:  CT 10/15/2024 FINDINGS: Gallbladder: No gallstones or wall thickening visualized. No sonographic Murphy sign noted by sonographer. Common bile duct: Diameter: 3.1 mm Liver: Slightly lobulated liver surface. Coarse hepatic echotexture. No focal hepatic abnormality. Portal vein is patent on color Doppler imaging with normal direction of blood flow towards the liver. Other: None. IMPRESSION: 1. Negative for gallstones. 2. Slightly lobulated liver surface with coarse hepatic echotexture, findings are suggestive of cirrhosis, correlate for risk factors. Electronically Signed   By: Luke Bun M.D.   On: 10/15/2024 15:29   CT ABDOMEN PELVIS W CONTRAST Result Date: 10/15/2024 CLINICAL DATA:  Abdominal pain and constipation. EXAM: CT ABDOMEN AND PELVIS WITH CONTRAST TECHNIQUE: Multidetector CT imaging of the abdomen and pelvis was performed using the standard protocol following bolus administration of intravenous contrast. RADIATION DOSE REDUCTION: This exam was performed according to the departmental dose-optimization program which includes automated exposure control, adjustment of the mA and/or kV according to patient size and/or use of iterative reconstruction technique. CONTRAST:  OMNIPAQUE  IOHEXOL  300 MG/ML   SOLN COMPARISON:  10/12/2024. FINDINGS: Lower chest: Centrilobular and paraseptal emphysema. Heart is at the upper limits of normal in size. No pericardial or pleural effusion. Distal esophagus is grossly unremarkable. Hepatobiliary: Liver and gallbladder are unremarkable. No biliary ductal dilatation. Pancreas: Negative. Spleen: Negative. Adrenals/Urinary Tract: Slight thickening of the right adrenal gland. No specific follow-up necessary. Left adrenal gland is unremarkable. Low-attenuation lesions in the kidneys. No specific follow-up necessary. Ureters are decompressed. Bladder is grossly unremarkable. Stomach/Bowel: Stomach, small bowel and appendix are unremarkable. Stool is seen in the ascending and transverse colon. Colon is otherwise unremarkable. Vascular/Lymphatic: Atherosclerotic calcification of the aorta. No pathologically enlarged lymph nodes. Reproductive: Uterus is visualized.  No adnexal mass. Other: No free fluid.  Mesenteries and peritoneum are unremarkable. Musculoskeletal: Degenerative changes in the spine. L3-4 posterior lumbar interbody fusion. IMPRESSION: 1. No acute findings. 2. Moderate stool burden in the ascending and transverse colon. 3. Emphysema (ICD10-J43.9). Low-dose CT lung cancer screening is recommended for patients who are 46-23 years of age with a 20+ pack-year history of smoking and who are currently smoking or quit <=15 years ago. 4.  Aortic atherosclerosis (ICD10-I70.0). Electronically Signed   By: Newell Eke M.D.   On: 10/15/2024 14:55   DG Abdomen 1 View Result Date: 10/15/2024 CLINICAL DATA:  Constipation.  Upper abdominal pain. EXAM: ABDOMEN - 1 VIEW COMPARISON:  10/08/2024. FINDINGS: Stool is seen in the ascending and descending colon. No small bowel dilatation. No unexpected radiopaque calculi. Visualized lung bases are clear. Postoperative changes in the lumbar spine. IMPRESSION: Mild to moderate stool burden. Electronically Signed   By: Newell Eke M.D.    On: 10/15/2024 11:02     Procedures   Medications Ordered in the ED  iohexol  (OMNIPAQUE ) 300 MG/ML solution 100 mL (100 mLs Intravenous Contrast Given 10/15/24 1341)  fentaNYL  (SUBLIMAZE ) injection 50 mcg (50 mcg Intravenous Given 10/15/24 1438)    Clinical Course as of 10/15/24 1532  Mon Oct 15, 2024  1528 Belly pain, N/V. CT with increased stool burden. Pending US .  [TY]    Clinical Course User Index [TY] Neysa Caron PARAS, DO  Medical Decision Making Amount and/or Complexity of Data Reviewed Labs: ordered. Radiology: ordered.  Risk Prescription drug management.    Meghan Lynn is a 73 y.o. female with a past medical history significant for CAD with previous NSTEMI, previous GI bleeding, previous esophageal dilation, GERD, hyperlipidemia, hypertension, and arthritis who presents with abdominal pain and constipation.  According to patient, she has not had a good bowel movement in about 12 days despite having an enema in the emergency department several days ago.  At that time she had CT scan showing a moderate stool burden but no obstruction.  She says that since leaving she continues to have abdominal pain more in the upper and right side of her abdomen.  She is having some discomfort when she is in drinks and is denying any urinary symptoms such as dysuria.  She denies any trauma.  She reports the pain is moderate to severe at times and she is still passing some gas.  On exam, lungs clear.  Chest nontender.  Abdomen is diffusely tender and I did hear bowel sounds.  Flanks and back nontender.  Patient otherwise well-appearing.  We had a shared decision and conversation and agreed to get a repeat CT scan given her worsened symptoms.  Will also add on an ultrasound given the upper abdominal tenderness on exam.  Will get screening labs and urinalysis.  Workup shows some mild to moderate stool burden on the ascending and transverse colon likely where  the patient is having her symptoms but no evidence of obstruction.  No diverticulitis.  No other acute surgical problem.  No UTI and labs otherwise reassuring.  We had a shared decision-making conversation offering another enema but patient says she would rather get treated at home and follow-up with outpatient gastroenterology.  This is reasonable.  She will crease her hydration and use over-the-counter medications and will follow-up.  Should with questions or concerns and was discharged in good condition.      Final diagnoses:  Constipation, unspecified constipation type  Abdominal pain, unspecified abdominal location    ED Discharge Orders     None       Clinical Impression: 1. Constipation, unspecified constipation type   2. Abdominal pain, unspecified abdominal location     Disposition: Discharge  Condition: Good  I have discussed the results, Dx and Tx plan with the pt(& family if present). He/she/they expressed understanding and agree(s) with the plan. Discharge instructions discussed at great length. Strict return precautions discussed and pt &/or family have verbalized understanding of the instructions. No further questions at time of discharge.    New Prescriptions   No medications on file    Follow Up: [facility] [address] [place]  [address] [phone]    Billy Knee, FNP [address] [place] [address] [phone]     [facility] Emergency Department at [facility] [address] [place]  [address] [phone]        Jaimey Franchini, Lonni PARAS, MD 10/15/24 [phone]

## 2024-10-15 NOTE — Telephone Encounter (Signed)
 Copied from CRM #[id number]. Topic: Clinical - Red Word Triage >> Oct 15, 2024  8:28 AM Berwyn MATSU wrote: Red Word that prompted transfer to Nurse Triage: pain having abdominal pain and not feeling well and and right side is impacted. >> Oct 15, 2024  9:02 AM Rosina BIRCH wrote: patient called stating she wanted to let the nurse know that she is going to the emergency room. Patient stated her stool is dark black, loose and grainy from the liquid of an enema. It has been going on for 12 days

## 2024-10-15 NOTE — ED Provider Triage Note (Signed)
 Emergency Medicine Provider Triage Evaluation Note  Meghan Lynn , a 73 y.o. female  was evaluated in triage.  Pt complains of constipation x 2 weeks. Was seen in the ED Friday, told she did not have a blockage but is concerned due to worsening/constant abdominal pain with nausea. Last BM Friday was all liquid and black like tar, she is concerned she has not had a BM in 2 weeks despite use of Miralax . Pain is constant and worse with movements.  Review of Systems  Positive: As above Negative: As above  Physical Exam  BP (!) 143/99 (BP Location: Left Arm)   Pulse 87   Temp 97.6 F (36.4 C) (Oral)   Resp 16   SpO2 99%  Gen:   Awake, no distress   Resp:  Normal effort  MSK:   Moves extremities without difficulty  Other:  Abdomen is diffusely tender to light palpation  Medical Decision Making  Medically screening exam initiated at 12:16 PM.  Appropriate orders placed.  [name] was informed that the remainder of the evaluation will be completed by another provider, this initial triage assessment does not replace that evaluation, and the importance of remaining in the ED until their evaluation is complete.     Glendia Rocky SAILOR, [place] 10/15/24 1218

## 2024-10-15 NOTE — ED Triage Notes (Signed)
 Pt reports having constipation x 12 days. Pt had a small bm Friday. Pt is having mid upper abdominal pain.

## 2024-10-24 ENCOUNTER — Telehealth: Payer: Self-pay | Admitting: Internal Medicine

## 2024-10-24 NOTE — Telephone Encounter (Signed)
 Please provide her with this info so she can call and reschedule her appt:   [facility] [address] [place]  [address] [phone]

## 2024-10-24 NOTE — Telephone Encounter (Signed)
 Copied from CRM [id number]. Topic: Referral - Question >> Oct 24, 2024 10:06 AM Nessti S wrote: Reason for CRM: pt would like another referral for gastroenterologist because last appt was canceled and pt is in pain. Call back number [phone]   ----------------------------------------------------------------------- From previous Reason for Contact - Referral Request: Did the patient discuss referral with their provider in the last year?   (If No - schedule appointment) (If Yes - send message)  Appointment offered?    Type of order/referral and detailed reason for visit:   Preference of office, provider, location:   If referral order, have you been seen by this specialty before?   (If Yes, this issue or another issue? When? Where?  Can we respond through MyChart?

## 2024-10-26 ENCOUNTER — Ambulatory Visit: Admitting: Physician Assistant

## 2024-10-26 NOTE — Telephone Encounter (Signed)
 Please advise patient to go to ER for persistent constipation. She will need another enema and possible admission for constipation.

## 2024-10-26 NOTE — Telephone Encounter (Signed)
 Pt was advise  to go to ER for persistent constipation. She will need another enema and possible admission for constipation. Also to keep GI appointment.

## 2024-10-26 NOTE — Telephone Encounter (Signed)
 Spoke with pt about referral to GI she is wanting another option due to appointment being so far out. Still having discomfort in stomach.

## 2024-11-07 NOTE — Progress Notes (Unsigned)
   11/09/2024  Patient ID: Meghan Lynn, female   DOB: Feb 18, 1951, 73 y.o.   MRN: [id number]  Subjective/Objective Telephone visit to follow-up on management of chronic conditions  Hypertension: Current medications: carvedilol  6.25mg  BID, valsartan  320mg  daily, spironolactone  12.5mg  daily -Patient has a validated, automated, upper arm home BP cuff and states BP is typically staying within goal of <130/80 -BP last OV 132/76 on 10/13  Hyperlipidemia/ASCVD Risk Reduction Current lipid lowering medications: Repatha  140mg  every 14 days -Medications tried in the past: statins cause severe muscle cramps that have required her calling EMS in the past -Patient started Repatha  approximately 2.5 months ago and is tolerating well so far with no noted adverse side effects Antiplatelet regimen: ASA 81mg  daily, clopidogrel  75mg  daily -Recently resumed clopidogrel  at 75mg  daily  -Patient is followed by cardiology  Lipid Panel     Component Value Date/Time   CHOL 228 (H) 07/05/2024 0858   CHOL 180 05/26/2023 0832   TRIG 177.0 (H) 07/05/2024 0858   HDL 44.80 07/05/2024 0858   HDL 40 05/26/2023 0832   CHOLHDL 5 07/05/2024 0858   VLDL 35.4 07/05/2024 0858   LDLCALC 148 (H) 07/05/2024 0858   LDLCALC 103 (H) 05/26/2023 0832   LDLDIRECT 82 04/26/2023 1406   LABVLDL 37 05/26/2023 0832    Heart Failure (EF 40-50%): Current medications:  ACEi/ARB/ARNI: Valsartan  320mg   SGLT2i: Farxiga  10mg  daily  Beta blocker: carvedilol  6.25mg  BID  Mineralocorticoid Receptor Antagonist: sprinoloactone 12.5mg  daily Diuretic regimen: furosemide  40mg  PRN   -Patient has started taking Farxiga  in the morning versus bedtime to prevent/decrease having to get up during the night to urinate -Has not needed furosemide  recently; does not report any SOB or chest pain.   COPD: Current medications:  Wixela 100/50mcg 1 puff BID -No recent exacerbations noted -Endorses come coughing on occasion but no SOB -Currently has  Advair HFA rescue inhaler on hand to use as needed but has not needed recently  Constipation: Current Medications:  Miralax  daily as needed -Patient has been to UC/ED twice in the past month for abdominal pain and was found to have significant stool in the GI tract on scans -States she used Miralax  daily x8 days with no bowel movement -She has experienced decreased appetite and weight loss -Received an enema at the hospital and did have a BM, but she endorses recent Bms have been black and coffee ground consistency -Abdominal pain and constipation has resolved some -Stopped taking daily MVI since iron component could be worsening constipation  Assessment/Plan:    Hypertension: -Currently moderately controlled (improving) -Continue current regimen at this time -Continue to monitor and record BP 30 minutes-1 hour after morning medications   Hyperlipidemia/ASCVD Risk Reduction: -Currently uncontrolled.  -Patient is due for follow-up lipid panel at this time and currently does not have a cards follow-up scheduled  Heart Failure: -Currently appropriately managed  -Continue current regimen -Continue to monitor BP and weight regularly   COPD: -Currently controlled  -Continue current regimen  Constipation: -Continue Miralax  daily as needed -Discussed adequate hydration to help Miralax  work but avoid fluid overload in patient with CHF -KEEP 12/15 APPOINTMENT WITH GI unless you can get in with another provider sooner  Follow Up Plan: 2 months   Channing DELENA Mealing, PharmD, DPLA

## 2024-11-09 ENCOUNTER — Other Ambulatory Visit: Payer: Self-pay

## 2024-11-09 DIAGNOSIS — I10 Essential (primary) hypertension: Secondary | ICD-10-CM

## 2024-11-09 DIAGNOSIS — E785 Hyperlipidemia, unspecified: Secondary | ICD-10-CM

## 2024-11-09 DIAGNOSIS — J449 Chronic obstructive pulmonary disease, unspecified: Secondary | ICD-10-CM

## 2024-11-09 DIAGNOSIS — I2510 Atherosclerotic heart disease of native coronary artery without angina pectoris: Secondary | ICD-10-CM

## 2024-11-17 ENCOUNTER — Other Ambulatory Visit: Payer: Self-pay | Admitting: Internal Medicine

## 2024-11-17 DIAGNOSIS — J449 Chronic obstructive pulmonary disease, unspecified: Secondary | ICD-10-CM

## 2024-11-19 NOTE — Telephone Encounter (Signed)
 Pt requesting refill albuterol  (VENTOLIN  HFA) 108 (90 Base) MCG/ACT inhaler   LOV 10/08/24 FOV not scheduled  LRF 08/24/24

## 2024-11-26 ENCOUNTER — Other Ambulatory Visit

## 2024-12-10 ENCOUNTER — Ambulatory Visit: Admitting: Physician Assistant

## 2025-01-09 ENCOUNTER — Other Ambulatory Visit: Payer: Self-pay

## 2025-01-09 DIAGNOSIS — E785 Hyperlipidemia, unspecified: Secondary | ICD-10-CM

## 2025-01-09 DIAGNOSIS — I10 Essential (primary) hypertension: Secondary | ICD-10-CM

## 2025-01-09 NOTE — Patient Instructions (Addendum)
 It was a pleasure speaking with you today!  I contacted your pharmacy to refill Repatha . I will follow up with you on Wednesday, February 25th. Please remember to schedule a follow-up visit with Meghan Lynn.  Feel free to call with any questions or concerns!

## 2025-01-09 NOTE — Progress Notes (Signed)
" ° °  01/09/25  Patient ID: Meghan Lynn, female   DOB: Dec 12, 1951, 74 y.o.   MRN: [id number]  Subjective/Objective Telephone visit to follow-up on management of chronic conditions  Hypertension: Current medications: carvedilol  6.25mg  BID, valsartan  320mg  daily, spironolactone  12.5mg  daily -Patient has a validated, automated, upper arm home BP cuff and states BP  has not been high, has not jumped up, reports SBP 145s to 170s DBP in 80s, but reports this is an improvement to BP spikes previously being 190s-210s -BP last OV 132/76 on 10/13  Hyperlipidemia/ASCVD Risk Reduction Current lipid lowering medications: Repatha  140mg  every 14 days -Medications tried in the past: statins cause severe muscle cramps that have required her calling EMS in the past -Patient tolerating Repatha  well so far with no noted adverse side effects. Dose is slightly delayed due to refill delay at the pharmacy.  Antiplatelet regimen: ASA 81mg  daily, clopidogrel  75mg  daily -Recently resumed clopidogrel  at 75mg  daily  -Patient is followed by cardiology  Lipid Panel     Component Value Date/Time   CHOL 228 (H) 07/05/2024 0858   CHOL 180 05/26/2023 0832   TRIG 177.0 (H) 07/05/2024 0858   HDL 44.80 07/05/2024 0858   HDL 40 05/26/2023 0832   CHOLHDL 5 07/05/2024 0858   VLDL 35.4 07/05/2024 0858   LDLCALC 148 (H) 07/05/2024 0858   LDLCALC 103 (H) 05/26/2023 0832   LDLDIRECT 82 04/26/2023 1406   LABVLDL 37 05/26/2023 0832    Heart Failure (EF 40-50%): Current medications:  ACEi/ARB/ARNI: Valsartan  320mg   SGLT2i: Farxiga  10mg  daily  Beta blocker: carvedilol  6.25mg  BID  Mineralocorticoid Receptor Antagonist: sprinoloactone 12.5mg  daily Diuretic regimen: furosemide  40mg  PRN  -Patient reports weight is fluctuating by 1-2 pounds, but has not needed furosemide  recently. Denies edema. States weight returns to baseline prior to needing furosemide .   COPD: Current medications:  Wixela 100/50mcg 1 puff BID -No  recent exacerbations noted -Currently has Advair HFA rescue inhaler on hand to use as needed but has not needed recently  Constipation: Current Medications:  Miralax  daily as needed -Patient has been to UC/ED twice in the past month for abdominal pain and was found to have significant stool in the GI tract on scans -States she used Miralax  daily x8 days with no bowel movement -Received an enema at the hospital and did have a BM, but she endorsed BMs were black and coffee ground consistency. This has resolved and patient now reports 2 bowel movements per day with formed stools.  -Stopped taking daily MVI since iron component could be worsening constipation -Patient reports she has not yet established with GI specialist.   Assessment/Plan:    Hypertension: -Currently moderately controlled -Continue current regimen at this time -Continue to monitor and record BP 30 minutes-1 hour after morning medications -If consistently >130/80, consider increasing spironolactone  to 25mg  daily if K is within normal limits   Hyperlipidemia/ASCVD Risk Reduction: -Currently uncontrolled.  -Patient is due for follow-up lipid panel at this time and currently does not have a cards follow-up scheduled -Contacted pharmacy, and Repatha  is going through for $0 copay on insurance and [employer]- medication should be ready for pick up later today  Heart Failure: -Currently appropriately managed  -Continue current regimen -Continue to monitor BP and weight regularly   COPD: -Currently controlled  -Continue current regimen  Constipation: -Resolved -Recommend to establish with GI specialist   Follow Up Plan: 6 weeks   Meghan Lynn, PharmD, DPLA "

## 2025-01-16 ENCOUNTER — Other Ambulatory Visit: Payer: Self-pay | Admitting: Internal Medicine

## 2025-01-16 DIAGNOSIS — Z951 Presence of aortocoronary bypass graft: Secondary | ICD-10-CM

## 2025-01-16 DIAGNOSIS — I2510 Atherosclerotic heart disease of native coronary artery without angina pectoris: Secondary | ICD-10-CM

## 2025-01-16 NOTE — Telephone Encounter (Signed)
 Pt is requesting refill for carvedilol  (COREG ) 6.25 MG tablet   LOV 10/08/24 FOV8/28/26 LRF 10/17/24  Pt requesting refill for clopidogrel  (PLAVIX ) 75 MG tablet  LOV 10/08/24 FOV 08/23/25 LRF 07/10/24

## 2025-01-18 ENCOUNTER — Telehealth: Payer: Self-pay

## 2025-01-18 NOTE — Telephone Encounter (Signed)
 Noted. Dm/cma

## 2025-01-18 NOTE — Telephone Encounter (Signed)
 Copied from CRM #[id number]. Topic: General - Other >> Jan 17, 2025 12:09 PM Mercedes MATSU wrote: Reason for CRM: Manya from the patients home health care services, states that the patient Had a in home assessment, urine test was administered and the measurement came back as abnormal. The value is 368, they will send a copy of the test results to the doctor and the member will be informed as well. No call back is needed, but if they provider does need to they can be reached at this [phone] 8am-5pm Monday-Friday central time zone.

## 2025-01-24 ENCOUNTER — Other Ambulatory Visit: Payer: Self-pay | Admitting: Internal Medicine

## 2025-01-24 DIAGNOSIS — Z1231 Encounter for screening mammogram for malignant neoplasm of breast: Secondary | ICD-10-CM

## 2025-01-29 ENCOUNTER — Ambulatory Visit
Admission: RE | Admit: 2025-01-29 | Discharge: 2025-01-29 | Disposition: A | Source: Ambulatory Visit | Attending: Internal Medicine | Admitting: Internal Medicine

## 2025-01-29 DIAGNOSIS — Z1231 Encounter for screening mammogram for malignant neoplasm of breast: Secondary | ICD-10-CM

## 2025-01-31 ENCOUNTER — Ambulatory Visit: Admitting: Internal Medicine

## 2025-01-31 ENCOUNTER — Ambulatory Visit: Payer: Self-pay | Admitting: Internal Medicine

## 2025-01-31 ENCOUNTER — Encounter: Payer: Self-pay | Admitting: Internal Medicine

## 2025-01-31 VITALS — BP 138/88 | HR 88 | Temp 97.8°F | Ht 61.0 in | Wt 134.8 lb

## 2025-01-31 DIAGNOSIS — I10 Essential (primary) hypertension: Secondary | ICD-10-CM

## 2025-01-31 DIAGNOSIS — R809 Proteinuria, unspecified: Secondary | ICD-10-CM

## 2025-01-31 DIAGNOSIS — Z23 Encounter for immunization: Secondary | ICD-10-CM

## 2025-01-31 LAB — MICROALBUMIN / CREATININE URINE RATIO
Creatinine,U: 62 mg/dL
Microalb Creat Ratio: 294.2 mg/g — ABNORMAL HIGH (ref 0.0–30.0)
Microalb, Ur: 18.2 mg/dL — ABNORMAL HIGH (ref 0.7–1.9)

## 2025-01-31 LAB — BASIC METABOLIC PANEL WITH GFR
BUN: 13 mg/dL (ref 6–23)
CO2: 32 meq/L (ref 19–32)
Calcium: 9.6 mg/dL (ref 8.4–10.5)
Chloride: 101 meq/L (ref 96–112)
Creatinine, Ser: 1.06 mg/dL (ref 0.40–1.20)
GFR: 51.7 mL/min — ABNORMAL LOW
Glucose, Bld: 121 mg/dL — ABNORMAL HIGH (ref 70–99)
Potassium: 3.7 meq/L (ref 3.5–5.1)
Sodium: 141 meq/L (ref 135–145)

## 2025-01-31 NOTE — Progress Notes (Signed)
 " [facility] [address] [place] [address] Dept: [phone] Dept Fax: [phone]  Acute Care Office Visit  Subjective:   Meghan Lynn 1951/07/16 01/31/2025  Chief Complaint  Patient presents with   Follow-up    Concerned about kidney level     HPI: Discussed the use of AI scribe software for clinical note transcription with the patient, who gave verbal consent to proceed.  History of Present Illness   Meghan Lynn is a 74 year old female with high blood pressure who presents with concerns about kidney function and blood pressure management.  She has been experiencing elevated blood pressure readings at home, with values reaching as high as 'two hundred and some over a hundred and some.' The readings have been inconsistent, with one arm showing higher readings than the other. She is currently taking valsartan , spironolactone , and carvedilol  as prescribed. Additionally, she uses furosemide  as needed for fluid retention, which she resumed last Sunday and plans to continue for a week.  She has noticed weight gain, with fluctuations between 129 and 135 pounds, which she attributes to fluid retention as her 'stomach gets puffy.' She manages this with furosemide .   A recent home health evaluation raised concerns about her kidney function. She states her urine sample was abnormal and the nurse told her to follow up with her PCP to check kidney function. She has no known history of diabetes or kidney disease, and previous blood work showed normal kidney function. She has hx of AKI due to diuresis, but this resolved.  No changes in urine color or decreased urination are noted.   BP Readings from Last 3 Encounters:  01/31/25 138/88  10/15/24 (!) 193/108  10/12/24 (!) 167/99   Wt Readings from Last 3 Encounters:  01/31/25 134 lb 12.8 oz (61.1 kg)  10/12/24 125 lb (56.7 kg)  10/12/24 125 lb (56.7 kg)     The following  portions of the patient's history were reviewed and updated as appropriate: past medical history, past surgical history, family history, social history, allergies, medications, and problem list.   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   NSTEMI (non-ST elevated myocardial infarction) (HCC) 02/06/2024   Erythrocytosis 02/06/2024   Asthma 09/16/2022   Symptomatic anemia 06/21/2020   Peptic ulcer disease    Hyperlipidemia    Dilatation of esophagus    Burning sensation of feet    Bronchitis due to tobacco use    Arthritis    Weakness of both lower extremities 06/22/2017   Shortness of breath 09/29/2016   GERD (gastroesophageal reflux disease) 09/30/2015   Coronary artery disease due to lipid rich plaque 08/29/2014   Essential hypertension 08/29/2014   Dyslipidemia 08/29/2014   Spondylolisthesis of l3-4 01/27/2013   Past Medical History:  Diagnosis Date   Active smoker    Anginal pain    Arthritis    Asthma    patient denies   Bronchitis due to tobacco use    Burning sensation of feet    Coronary artery disease    3 stents to RCA   Dilatation of esophagus    Gastritis    GERD (gastroesophageal reflux  disease)    H/O pyloric stenosis 01/31/2020   Headache(784.0)    cluster every day   Headache, migraine    daily   Hyperlipidemia    Hypertension    Lower GI bleed 06/20/2020   Peptic ulcer disease    Pneumonia    Shortness of breath dyspnea    with exertion   Past Surgical History:  Procedure Laterality Date   BACK SURGERY     BALLOON DILATION N/A 05/30/2013   Procedure: BALLOON DILATION;  Surgeon: Norleen JAYSON Hint, MD;  Location: [facility] ENDOSCOPY;  Service:  Endoscopy;  Laterality: N/A;   BALLOON DILATION N/A 08/02/2013   Procedure: BALLOON DILATION;  Surgeon: Norleen JAYSON Hint, MD;  Location: [facility] ENDOSCOPY;  Service: Endoscopy;  Laterality: N/A;   BALLOON DILATION N/A 04/29/2016   Procedure: BALLOON DILATION;  Surgeon: Norleen Hint, MD;  Location: [facility] ENDOSCOPY;  Service: Endoscopy;  Laterality: N/A;   BALLOON DILATION N/A 06/16/2020   Procedure: BALLOON DILATION;  Surgeon: Saintclair Jasper, MD;  Location: [facility] ENDOSCOPY;  Service: Gastroenterology;  Laterality: N/A;   BIOPSY  06/16/2020   Procedure: BIOPSY;  Surgeon: Saintclair Jasper, MD;  Location: [facility] ENDOSCOPY;  Service: Gastroenterology;;   CARDIAC CATHETERIZATION  03/07/2002   moderte CAD 80% (mid RCA) - Dr. Elsie Ona   CARDIAC CATHETERIZATION  09/13/2003   Cypher 2.5x37mm and 2.5x18 and Taxus 2.75x46mm to distal, mid, prox RCA (Dr. FABIENE Hasten)   CARDIOLITE MYOCARDIAL PERFUSION STUDY  05/2006   positive bruce protocol, low risk, EF 80%   CARDIOVASCULAR STRESS TEST  2010   COLONOSCOPY     COLONOSCOPY WITH PROPOFOL  N/A 06/16/2020   Procedure: COLONOSCOPY WITH PROPOFOL ;  Surgeon: Saintclair Jasper, MD;  Location: [facility] ENDOSCOPY;  Service: Gastroenterology;  Laterality: N/A;   COLONOSCOPY WITH PROPOFOL  N/A 06/21/2020   Procedure: COLONOSCOPY WITH PROPOFOL ;  Surgeon: Elicia Claw, MD;  Location: [facility] ENDOSCOPY;  Service: Gastroenterology;  Laterality: N/A;   CORONARY ANGIOPLASTY  2004   stents x 3   CORONARY ARTERY BYPASS GRAFT N/A 02/10/2024   Procedure: CORONARY ARTERY BYPASS GRAFTING (CABG) TIMES THREE USING LEFT INTERNAL MAMMARY ARTERY AND ENDOSCOPICALLY HARVESTED RIGHT GREATER SAPHENOUS VEIN.;  Surgeon: Kerrin Elspeth JAYSON, MD;  Location: [facility] OR;  Service: Open Heart Surgery;  Laterality: N/A;   DILATATION & CURETTAGE/HYSTEROSCOPY WITH MYOSURE N/A 08/19/2016   Procedure: DILATATION & CURETTAGE/HYSTEROSCOPY WITH MYOSURE;  Surgeon: Dickie Carder, MD;  Location: [facility] ORS;  Service: Gynecology;  Laterality: N/A;    ESOPHAGOGASTRODUODENOSCOPY N/A 05/30/2013   Procedure: ESOPHAGOGASTRODUODENOSCOPY (EGD);  Surgeon: Norleen JAYSON Hint, MD;  Location: [facility] ENDOSCOPY;  Service: Endoscopy;  Laterality: N/A;   ESOPHAGOGASTRODUODENOSCOPY N/A 08/02/2013   Procedure: ESOPHAGOGASTRODUODENOSCOPY (EGD);  Surgeon: Norleen JAYSON Hint, MD;  Location: [facility] ENDOSCOPY;  Service: Endoscopy;  Laterality: N/A;  barb /ja   ESOPHAGOGASTRODUODENOSCOPY (EGD) WITH PROPOFOL  N/A 04/29/2016   Procedure: ESOPHAGOGASTRODUODENOSCOPY (EGD) WITH PROPOFOL ;  Surgeon: Norleen Hint, MD;  Location: [facility] ENDOSCOPY;  Service: Endoscopy;  Laterality: N/A;   ESOPHAGOGASTRODUODENOSCOPY (EGD) WITH PROPOFOL  N/A 06/16/2020   Procedure: ESOPHAGOGASTRODUODENOSCOPY (EGD) WITH PROPOFOL ;  Surgeon: Saintclair Jasper, MD;  Location: [facility] ENDOSCOPY;  Service: Gastroenterology;  Laterality: N/A;   FRACTURE SURGERY     HEMOSTASIS CLIP PLACEMENT  06/21/2020   Procedure: HEMOSTASIS CLIP PLACEMENT;  Surgeon: Elicia Claw, MD;  Location: [facility] ENDOSCOPY;  Service: Gastroenterology;;   IABP INSERTION Right 02/08/2024   Procedure: IABP Insertion;  Surgeon: Wendel Lurena POUR, MD;  Location: [facility] INVASIVE CV LAB;  Service: Cardiovascular;  Laterality: Right;   LEFT HEART CATH  AND CORONARY ANGIOGRAPHY N/A 02/08/2024   Procedure: LEFT HEART CATH AND CORONARY ANGIOGRAPHY;  Surgeon: Wendel Lurena POUR, MD;  Location: [facility] INVASIVE CV LAB;  Service: Cardiovascular;  Laterality: N/A;   ORIF ELBOW FRACTURE  2006   POLYPECTOMY  06/16/2020   Procedure: POLYPECTOMY;  Surgeon: Saintclair Jasper, MD;  Location: [facility] ENDOSCOPY;  Service: Gastroenterology;;   POLYPECTOMY  06/21/2020   Procedure: POLYPECTOMY;  Surgeon: Elicia Claw, MD;  Location: [facility] ENDOSCOPY;  Service: Gastroenterology;;   TEE WITHOUT CARDIOVERSION N/A 02/10/2024   Procedure: TRANSESOPHAGEAL ECHOCARDIOGRAM (TEE);  Surgeon: Kerrin Elspeth BROCKS, MD;  Location: [facility] OR;  Service: Open Heart Surgery;  Laterality: N/A;   TRANSTHORACIC ECHOCARDIOGRAM  07/2008   normal LV  function, mild MR, mild TR, trace pulm valve regurg   Family History  Problem Relation Age of Onset   Diabetes Mother    CAD Mother        CABG   Bone cancer Father    Hypertension Father    Heart disease Brother    Kidney disease Sister    Cancer Sister    Diabetes Sister    Hyperlipidemia Sister    Hypertension Sister    Current Medications[1] Allergies[2]   ROS: A complete ROS was performed with pertinent positives/negatives noted in the HPI. The remainder of the ROS are negative.    Objective:   Today's Vitals   01/31/25 1046 01/31/25 1110  BP: 138/88 138/88  Pulse: 88   Temp: 97.8 F (36.6 C)   TempSrc: Temporal   SpO2: 98%   Weight: 134 lb 12.8 oz (61.1 kg)   Height: 5' 1 (1.549 m)     GENERAL: Well-appearing, in NAD. Well nourished.  SKIN: Pink, warm and dry. No rash, lesion, ulceration, or ecchymoses.  RESPIRATORY: Chest wall symmetrical. Respirations even and non-labored. Breath sounds clear to auscultation bilaterally.  CARDIAC: S1, S2 present, regular rate and rhythm. Peripheral pulses 2+ bilaterally. No carotid bruits.  MSK: Muscle tone and strength appropriate for age.  EXTREMITIES: Without clubbing, cyanosis, or edema.  NEUROLOGIC:  Steady, even gait.  PSYCH/MENTAL STATUS: Alert, oriented x 3. Cooperative, appropriate mood and affect.    No results found for any visits on 01/31/25.    Assessment & Plan:  Assessment and Plan    Hypertension Recent elevated readings likely due to fluid accumulation. Current reading 138/88 mmHg.  - Continue valsartan , spironolactone , and carvedilol . - Continue Lasix  1 tablet daily for 7 days to remove excess fluid.  - Monitor blood pressure regularly. - BMP - Follow up with cardiologist as needed.  Microalbuminuria  Concerns about kidney function due to home health evaluation. Recent blood work normal. No diabetes history. - Ordered blood work to check kidney function  - Ordered urine sample to check  microalbumin. - Consider referral to nephrologist if kidney function is impaired.  General health maintenance Due for pneumonia vaccine. Shingles vaccine not covered by Medicare here, advised to get at pharmacy. Discussed potential side effects of shingles vaccine. - Administered pneumonia vaccine today. - Advised to get shingles vaccine at pharmacy.     No orders of the defined types were placed in this encounter.  Orders Placed This Encounter  Procedures   Pneumococcal conjugate vaccine 20-valent   Basic metabolic panel with GFR   Microalbumin / creatinine urine ratio   Lab Orders         Basic metabolic panel with GFR         Microalbumin / creatinine urine ratio  No images are attached to the encounter or orders placed in the encounter.  Return in about 4 months (around 05/31/2025) for Hypertension, Cholesterol, COPD, GERD - lab work at visit.   Rosina Senters, FNP     [1]  Current Outpatient Medications:    albuterol  (VENTOLIN  HFA) 108 (90 Base) MCG/ACT inhaler, INHALE 2 PUFFS EVERY 4-6 HOURS AS NEEDED FOR SHORTNESS OF BREATH OR WHEEZING, Disp: 18 each, Rfl: 1   aspirin  EC 81 MG tablet, Take 1 tablet (81 mg total) by mouth daily. Swallow whole., Disp: , Rfl:    Biotin 1 MG CAPS, Take 1 mg by mouth daily. , Disp: , Rfl:    carvedilol  (COREG ) 6.25 MG tablet, TAKE 1 TABLET BY MOUTH 2 TIMES DAILY WITH A MEAL., Disp: 180 tablet, Rfl: 2   clopidogrel  (PLAVIX ) 75 MG tablet, TAKE 1 TABLET BY MOUTH EVERY DAY, Disp: 90 tablet, Rfl: 2   dapagliflozin  propanediol (FARXIGA ) 10 MG TABS tablet, Take 1 tablet (10 mg total) by mouth daily., Disp: 90 tablet, Rfl: 1   Evolocumab  (REPATHA  SURECLICK) 140 MG/ML SOAJ, Inject 140 mg into the skin every 14 (fourteen) days., Disp: 6 mL, Rfl: 3   FLUZONE HIGH-DOSE 0.5 ML injection, , Disp: , Rfl:    furosemide  (LASIX ) 40 MG tablet, Take 1 tablet (40 mg total) by mouth daily. X 7 days, then transition to daily as needed for weight gain of 3-5 lbs in  24-48 hours (Patient taking differently: Take 40 mg by mouth daily as needed for edema. X 7 days, then transition to daily as needed for weight gain of 3-5 lbs in 24-48 hours), Disp: 30 tablet, Rfl: 1   Multiple Vitamin (MULTIVITAMIN WITH MINERALS) TABS, Take 1 tablet by mouth daily., Disp: , Rfl:    nitroGLYCERIN  (NITROSTAT ) 0.4 MG SL tablet, Place 1 tablet (0.4 mg total) under the tongue every 5 (five) minutes as needed for chest pain., Disp: 25 tablet, Rfl: 3   omeprazole (PRILOSEC) 20 MG capsule, Take 20 mg by mouth daily., Disp: , Rfl:    spironolactone  (ALDACTONE ) 25 MG tablet, Take 0.5 tablets (12.5 mg total) by mouth daily., Disp: 90 tablet, Rfl: 1   valsartan  (DIOVAN ) 320 MG tablet, Take 1 tablet (320 mg total) by mouth daily., Disp: 90 tablet, Rfl: 3   WIXELA INHUB  100-50 MCG/ACT AEPB, INHALE 1 PUFF INTO THE LUNGS TWICE A DAY, Disp: 60 each, Rfl: 3   glycerin  adult 2 g suppository, Place 1 suppository rectally as needed for constipation. (Patient not taking: Reported on 01/31/2025), Disp: 12 suppository, Rfl: 0   potassium chloride  SA (KLOR-CON  M) 20 MEQ tablet, Take 1 tablet (20 mEq total) by mouth daily. X 7 days, then use only on days you take Lasix  (Patient not taking: Reported on 01/31/2025), Disp: 30 tablet, Rfl: 3   tiZANidine (ZANAFLEX) 2 MG tablet, Take 1 tablet (2 mg total) by mouth every 6 (six) hours as needed for muscle spasms. (Patient not taking: Reported on 01/31/2025), Disp: 90 tablet, Rfl: 1 [2]  Allergies Allergen Reactions   Rosuvastatin      Muscle aches   Atorvastatin  Other (See Comments)    Muscle cramping   Latex Itching   Propoxyphene Nausea And Vomiting    Darvocet   "

## 2025-02-01 ENCOUNTER — Ambulatory Visit: Payer: Self-pay | Admitting: Internal Medicine

## 2025-02-01 DIAGNOSIS — N1831 Chronic kidney disease, stage 3a: Secondary | ICD-10-CM | POA: Insufficient documentation

## 2025-02-01 DIAGNOSIS — R809 Proteinuria, unspecified: Secondary | ICD-10-CM | POA: Insufficient documentation

## 2025-02-01 NOTE — Progress Notes (Signed)
 Hi Divya, Your urine sample does show protein present in your urine, and your kidney function has decreased.  I am placing referral to nephrology for them to further evaluate this for you.  They will be reaching out to schedule your appointment.  Rosina

## 2025-02-20 ENCOUNTER — Other Ambulatory Visit

## 2025-08-23 ENCOUNTER — Ambulatory Visit
